# Patient Record
Sex: Male | Born: 1980
Health system: Southern US, Community
[De-identification: ages and names within clinical notes are randomized; demographics above are authoritative.]

## PROBLEM LIST (undated history)

## (undated) DIAGNOSIS — T7840XA Allergy, unspecified, initial encounter: Secondary | ICD-10-CM

## (undated) DIAGNOSIS — R0789 Other chest pain: Secondary | ICD-10-CM

## (undated) DIAGNOSIS — M545 Low back pain, unspecified: Secondary | ICD-10-CM

## (undated) DIAGNOSIS — G4730 Sleep apnea, unspecified: Secondary | ICD-10-CM

## (undated) DIAGNOSIS — E119 Type 2 diabetes mellitus without complications: Secondary | ICD-10-CM

## (undated) DIAGNOSIS — F32A Depression, unspecified: Secondary | ICD-10-CM

## (undated) DIAGNOSIS — K209 Esophagitis, unspecified without bleeding: Secondary | ICD-10-CM

## (undated) DIAGNOSIS — J45909 Unspecified asthma, uncomplicated: Secondary | ICD-10-CM

## (undated) DIAGNOSIS — G43909 Migraine, unspecified, not intractable, without status migrainosus: Secondary | ICD-10-CM

## (undated) DIAGNOSIS — K5790 Diverticulosis of intestine, part unspecified, without perforation or abscess without bleeding: Secondary | ICD-10-CM

## (undated) DIAGNOSIS — E785 Hyperlipidemia, unspecified: Secondary | ICD-10-CM

## (undated) DIAGNOSIS — K219 Gastro-esophageal reflux disease without esophagitis: Secondary | ICD-10-CM

## (undated) DIAGNOSIS — F329 Major depressive disorder, single episode, unspecified: Secondary | ICD-10-CM

## (undated) DIAGNOSIS — K589 Irritable bowel syndrome without diarrhea: Secondary | ICD-10-CM

## (undated) DIAGNOSIS — R1013 Epigastric pain: Secondary | ICD-10-CM

## (undated) DIAGNOSIS — I10 Essential (primary) hypertension: Secondary | ICD-10-CM

## (undated) DIAGNOSIS — E663 Overweight: Secondary | ICD-10-CM

## (undated) DIAGNOSIS — F419 Anxiety disorder, unspecified: Secondary | ICD-10-CM

## (undated) HISTORY — DX: Depression, unspecified: F32.A

## (undated) HISTORY — DX: Hyperlipidemia, unspecified: E78.5

## (undated) HISTORY — DX: Esophagitis, unspecified without bleeding: K20.90

## (undated) HISTORY — DX: Migraine, unspecified, not intractable, without status migrainosus: G43.909

## (undated) HISTORY — DX: Low back pain: M54.5

## (undated) HISTORY — DX: Other chest pain: R07.89

## (undated) HISTORY — DX: Overweight: E66.3

## (undated) HISTORY — DX: Type 2 diabetes mellitus without complications: E11.9

## (undated) HISTORY — DX: Irritable bowel syndrome, unspecified: K58.9

## (undated) HISTORY — DX: Sleep apnea, unspecified: G47.30

## (undated) HISTORY — DX: Allergy, unspecified, initial encounter: T78.40XA

## (undated) HISTORY — DX: Low back pain, unspecified: M54.50

## (undated) HISTORY — PX: TEAR DUCT PROBING: SHX793

## (undated) HISTORY — DX: Anxiety disorder, unspecified: F41.9

## (undated) HISTORY — DX: Epigastric pain: R10.13

## (undated) HISTORY — DX: Diverticulosis of intestine, part unspecified, without perforation or abscess without bleeding: K57.90

## (undated) HISTORY — DX: Unspecified asthma, uncomplicated: J45.909

## (undated) HISTORY — DX: Essential (primary) hypertension: I10

## (undated) HISTORY — PX: DENTAL SURGERY: SHX609

## (undated) HISTORY — DX: Gastro-esophageal reflux disease without esophagitis: K21.9

## (undated) HISTORY — DX: Major depressive disorder, single episode, unspecified: F32.9

---

## 2002-01-29 ENCOUNTER — Emergency Department (HOSPITAL_COMMUNITY): Admission: EM | Admit: 2002-01-29 | Discharge: 2002-01-29 | Payer: Self-pay | Admitting: Emergency Medicine

## 2002-01-29 ENCOUNTER — Encounter: Payer: Self-pay | Admitting: Emergency Medicine

## 2002-04-10 ENCOUNTER — Ambulatory Visit (HOSPITAL_BASED_OUTPATIENT_CLINIC_OR_DEPARTMENT_OTHER): Admission: RE | Admit: 2002-04-10 | Discharge: 2002-04-10 | Payer: Self-pay | Admitting: Neurology

## 2002-08-22 ENCOUNTER — Encounter (INDEPENDENT_AMBULATORY_CARE_PROVIDER_SITE_OTHER): Payer: Self-pay | Admitting: *Deleted

## 2002-08-22 ENCOUNTER — Encounter: Payer: Self-pay | Admitting: Internal Medicine

## 2002-08-22 ENCOUNTER — Encounter: Admission: RE | Admit: 2002-08-22 | Discharge: 2002-08-22 | Payer: Self-pay | Admitting: Internal Medicine

## 2004-07-15 ENCOUNTER — Encounter: Admission: RE | Admit: 2004-07-15 | Discharge: 2004-07-15 | Payer: Self-pay | Admitting: Neurosurgery

## 2004-08-05 ENCOUNTER — Encounter: Admission: RE | Admit: 2004-08-05 | Discharge: 2004-08-05 | Payer: Self-pay | Admitting: Neurosurgery

## 2004-10-24 ENCOUNTER — Encounter: Admission: RE | Admit: 2004-10-24 | Discharge: 2004-10-24 | Payer: Self-pay | Admitting: Neurosurgery

## 2005-01-30 HISTORY — PX: LUMBAR LAMINECTOMY: SHX95

## 2005-05-26 ENCOUNTER — Ambulatory Visit (HOSPITAL_COMMUNITY): Admission: RE | Admit: 2005-05-26 | Discharge: 2005-05-27 | Payer: Self-pay | Admitting: Neurosurgery

## 2005-10-12 ENCOUNTER — Ambulatory Visit: Payer: Self-pay | Admitting: Pulmonary Disease

## 2006-01-17 ENCOUNTER — Ambulatory Visit: Payer: Self-pay | Admitting: Pulmonary Disease

## 2007-05-07 ENCOUNTER — Ambulatory Visit (HOSPITAL_COMMUNITY): Admission: RE | Admit: 2007-05-07 | Discharge: 2007-05-07 | Payer: Self-pay | Admitting: Family Medicine

## 2007-11-18 ENCOUNTER — Telehealth (INDEPENDENT_AMBULATORY_CARE_PROVIDER_SITE_OTHER): Payer: Self-pay | Admitting: *Deleted

## 2007-11-18 DIAGNOSIS — T7840XA Allergy, unspecified, initial encounter: Secondary | ICD-10-CM | POA: Insufficient documentation

## 2007-11-19 ENCOUNTER — Ambulatory Visit: Payer: Self-pay | Admitting: Internal Medicine

## 2007-11-19 DIAGNOSIS — K219 Gastro-esophageal reflux disease without esophagitis: Secondary | ICD-10-CM | POA: Insufficient documentation

## 2007-11-19 LAB — CONVERTED CEMR LAB: Streptococcus, Group A Screen (Direct): NEGATIVE

## 2007-12-20 ENCOUNTER — Ambulatory Visit: Payer: Self-pay | Admitting: Internal Medicine

## 2007-12-23 ENCOUNTER — Telehealth (INDEPENDENT_AMBULATORY_CARE_PROVIDER_SITE_OTHER): Payer: Self-pay | Admitting: *Deleted

## 2007-12-25 LAB — CONVERTED CEMR LAB
ALT: 45 units/L (ref 0–53)
AST: 28 units/L (ref 0–37)
Albumin: 3.9 g/dL (ref 3.5–5.2)
Alkaline Phosphatase: 53 units/L (ref 39–117)
Basophils Absolute: 0 10*3/uL (ref 0.0–0.1)
Basophils Relative: 0.4 % (ref 0.0–3.0)
Calcium: 9.5 mg/dL (ref 8.4–10.5)
Eosinophils Absolute: 0.1 10*3/uL (ref 0.0–0.7)
Eosinophils Relative: 1 % (ref 0.0–5.0)
HCT: 42.9 % (ref 39.0–52.0)
Lymphocytes Relative: 20.9 % (ref 12.0–46.0)
MCV: 89.4 fL (ref 78.0–100.0)
Monocytes Relative: 7.8 % (ref 3.0–12.0)
Neutrophils Relative %: 69.9 % (ref 43.0–77.0)
RDW: 13.8 % (ref 11.5–14.6)
Total Protein: 6.9 g/dL (ref 6.0–8.3)
WBC: 6.4 10*3/uL (ref 4.5–10.5)

## 2008-01-17 ENCOUNTER — Encounter: Payer: Self-pay | Admitting: Adult Health

## 2008-01-17 ENCOUNTER — Ambulatory Visit: Payer: Self-pay | Admitting: Pulmonary Disease

## 2008-01-20 ENCOUNTER — Telehealth (INDEPENDENT_AMBULATORY_CARE_PROVIDER_SITE_OTHER): Payer: Self-pay | Admitting: *Deleted

## 2008-01-21 ENCOUNTER — Encounter: Payer: Self-pay | Admitting: Adult Health

## 2008-02-06 ENCOUNTER — Ambulatory Visit: Payer: Self-pay | Admitting: Pulmonary Disease

## 2008-02-06 DIAGNOSIS — R0789 Other chest pain: Secondary | ICD-10-CM | POA: Insufficient documentation

## 2008-02-06 DIAGNOSIS — E663 Overweight: Secondary | ICD-10-CM | POA: Insufficient documentation

## 2008-02-06 DIAGNOSIS — M545 Low back pain, unspecified: Secondary | ICD-10-CM | POA: Insufficient documentation

## 2008-02-06 DIAGNOSIS — E785 Hyperlipidemia, unspecified: Secondary | ICD-10-CM | POA: Insufficient documentation

## 2008-02-06 DIAGNOSIS — M47816 Spondylosis without myelopathy or radiculopathy, lumbar region: Secondary | ICD-10-CM | POA: Insufficient documentation

## 2008-02-12 ENCOUNTER — Ambulatory Visit: Payer: Self-pay | Admitting: Gastroenterology

## 2008-03-23 ENCOUNTER — Telehealth: Payer: Self-pay | Admitting: Gastroenterology

## 2008-03-24 ENCOUNTER — Encounter: Payer: Self-pay | Admitting: Gastroenterology

## 2008-03-26 ENCOUNTER — Ambulatory Visit: Payer: Self-pay | Admitting: Gastroenterology

## 2008-04-02 ENCOUNTER — Ambulatory Visit: Payer: Self-pay | Admitting: Gastroenterology

## 2008-04-03 ENCOUNTER — Telehealth: Payer: Self-pay | Admitting: Gastroenterology

## 2008-04-03 DIAGNOSIS — R143 Flatulence: Secondary | ICD-10-CM

## 2008-04-03 DIAGNOSIS — R142 Eructation: Secondary | ICD-10-CM

## 2008-04-03 DIAGNOSIS — R141 Gas pain: Secondary | ICD-10-CM | POA: Insufficient documentation

## 2008-04-10 ENCOUNTER — Telehealth: Payer: Self-pay | Admitting: Gastroenterology

## 2008-04-10 ENCOUNTER — Ambulatory Visit (HOSPITAL_COMMUNITY): Admission: RE | Admit: 2008-04-10 | Discharge: 2008-04-10 | Payer: Self-pay | Admitting: Gastroenterology

## 2008-04-14 ENCOUNTER — Telehealth: Payer: Self-pay | Admitting: Gastroenterology

## 2008-04-15 ENCOUNTER — Telehealth: Payer: Self-pay | Admitting: Gastroenterology

## 2008-04-15 ENCOUNTER — Telehealth (INDEPENDENT_AMBULATORY_CARE_PROVIDER_SITE_OTHER): Payer: Self-pay | Admitting: *Deleted

## 2008-04-15 ENCOUNTER — Encounter: Payer: Self-pay | Admitting: Gastroenterology

## 2008-05-25 ENCOUNTER — Ambulatory Visit: Payer: Self-pay | Admitting: Pulmonary Disease

## 2008-05-25 DIAGNOSIS — R5381 Other malaise: Secondary | ICD-10-CM | POA: Insufficient documentation

## 2008-05-25 DIAGNOSIS — R5383 Other fatigue: Secondary | ICD-10-CM

## 2008-05-26 DIAGNOSIS — E291 Testicular hypofunction: Secondary | ICD-10-CM | POA: Insufficient documentation

## 2008-05-27 ENCOUNTER — Encounter (INDEPENDENT_AMBULATORY_CARE_PROVIDER_SITE_OTHER): Payer: Self-pay | Admitting: *Deleted

## 2008-09-08 ENCOUNTER — Telehealth (INDEPENDENT_AMBULATORY_CARE_PROVIDER_SITE_OTHER): Payer: Self-pay | Admitting: *Deleted

## 2008-09-09 ENCOUNTER — Ambulatory Visit: Payer: Self-pay | Admitting: Internal Medicine

## 2008-11-18 ENCOUNTER — Telehealth (INDEPENDENT_AMBULATORY_CARE_PROVIDER_SITE_OTHER): Payer: Self-pay | Admitting: *Deleted

## 2009-02-26 ENCOUNTER — Telehealth: Payer: Self-pay | Admitting: Adult Health

## 2009-03-01 ENCOUNTER — Telehealth: Payer: Self-pay | Admitting: Pulmonary Disease

## 2009-04-05 ENCOUNTER — Telehealth (INDEPENDENT_AMBULATORY_CARE_PROVIDER_SITE_OTHER): Payer: Self-pay | Admitting: *Deleted

## 2010-02-21 ENCOUNTER — Encounter: Payer: Self-pay | Admitting: Family Medicine

## 2010-03-01 NOTE — Progress Notes (Signed)
Summary: appt  Phone Note Call from Patient Call back at [facility] Phone [phone] Call back at [phone] Meriam Sprague)   Caller: Mom Call For: [name] Reason for Call: Acute Illness, Talk to Nurse Summary of Call: dizzy, extreme sob, chest hurts, nose stopped up, watery eyes.  Would like to be seen today if possible.  Initial call taken by: Eugene Gavia,  March 01, 2009 2:25 PM  Follow-up for Phone Call        called to speak with pt----stated we have no openings today and explained that he could see SN on 2-2 but i could get him in to see TP in the am at 11---pt stated that he needed to be seen today and he would just go to the [facility] to be seen. i did rec to pt that if he felt that he needed to be seen now that he should either go to the er or to [facility] [address] CMA  March 01, 2009 2:35 PM

## 2010-03-01 NOTE — Progress Notes (Signed)
Summary: pain  Phone Note Call from Patient   Caller: Patient Call For: nadel Summary of Call: pt having pain in buttock area would like to talk to nurse. Initial call taken by: Rickard Patience,  April 05, 2009 8:44 AM  Follow-up for Phone Call        Pt c/o having soreness in the middle of his buttocks x 3 days. The soreness has turned into a sharp pain. Pt denies any fall or trauma to the area. he aslo states it is not from hemorrhoids. Pt does not radiate to any other area. Please advise. Carron Curie CMA  April 05, 2009 9:40 AM   Additional Follow-up for Phone Call Additional follow up Details #1::        per SN----needs to be seen to see what this is---SN has opening on 3-8 at 12 if pt would like to come in at that time.  thanks [address] CMA  April 05, 2009 3:11 PM   Patient schedule for 04/06/09 @ 12pm and is aware of date and time.Michel Bickers CMA  April 05, 2009 4:05 PM

## 2010-03-01 NOTE — Letter (Signed)
Summary: Out of Work  [employer]  [address]   [place], [place] [address]   Phone: [phone]  Fax: [phone]    January 17, 2008   Employee:  ACESON LABELL    To Whom It May Concern:   For Medical reasons, please excuse the above named employee from work for the following dates:  Start:   Friday January 17, 2008    End:   Saturday Decemeber 19, 2009  Patient may return to work with no restrictions.  If you need additional information, please feel free to contact our office.         Sincerely,        Rubye Oaks, NP

## 2010-03-01 NOTE — Progress Notes (Signed)
Summary: rx  Phone Note Call from Patient Call back at Home Phone [phone] Call back at [phone]   Caller: Mom-Beverly Call For: nadel Reason for Call: Acute Illness, Talk to Nurse Summary of Call: congested, head stopped up, sob.  Would like [place] to call something in. [facility] [address] Initial call taken by: Eugene Gavia,  February 26, 2009 2:35 PM  Follow-up for Phone Call        [email or web address] [address] [facility]  February 26, 2009 2:47 PM   congestion for a while--zpak x 2  week ago given by [facility]--finished the zpak last monday--light green sputum from cough and nasal congestion---no fever---been using the zyrtec but not the mucinex.  please advise---  NKDA [address] [facility]  February 26, 2009 2:53 PM   Additional Follow-up for Phone Call Additional follow up Details #1::        keep on mucinex dm two times a day  saline nasal rinses  for few more days if not improving call back  may need ov.  Additional Follow-up by: Tammy Parrett NP,  February 26, 2009 2:58 PM    Additional Follow-up for Phone Call Additional follow up Details #2::    called and spoke with pt  about recs from TP---pt is aware and will do these over the weekend--will call next week if not better for appt. [address] CMA  February 26, 2009 3:02 PM

## 2010-04-21 ENCOUNTER — Telehealth: Payer: Self-pay | Admitting: Pulmonary Disease

## 2010-04-21 NOTE — Telephone Encounter (Signed)
Duplicate msg.

## 2010-04-21 NOTE — Telephone Encounter (Signed)
Called and spoke with pt and he c/o cough w/ light yellow phlem x 2 weeks, some wheezing, started vomiting today, had a little loss of appetite. Pt is requesting to be seen today. Their are no openings today but per TD we can work pt in tomorrow morning at 10:00. Pt is coming in then to see TP. Pt states if he gets worse today then he will just got to an Urgent care and I advised pt to do that if he feels he can't wait until tomorrow to bee seen. Pt was last seen in 2010.   [place]ver [place], [place]

## 2010-04-22 ENCOUNTER — Ambulatory Visit: Payer: Self-pay | Admitting: Adult Health

## 2010-04-22 ENCOUNTER — Encounter: Payer: Self-pay | Admitting: Pulmonary Disease

## 2010-06-17 NOTE — Op Note (Signed)
NAMELON, KLIPPEL                ACCOUNT NO.:  [id number]   MEDICAL RECORD NO.:  [id number]          PATIENT TYPE:  AMB   LOCATION:  SDS                          FACILITY:  [facility]   PHYSICIAN:  Coletta Memos, M.D.     DATE OF BIRTH:  Sep 03, 1980   DATE OF PROCEDURE:  05/26/2005  DATE OF DISCHARGE:                                 OPERATIVE REPORT   PREOPERATIVE DIAGNOSIS:  Degenerative disk disease L4-L5, L5-S1, displaced  disk L4-L5, L5-S1, left L5 and left S1 radiculopathies.   POSTOPERATIVE DIAGNOSES:  Degenerative disk disease L4-L5, L5-S1, displaced  disk L4-L5, L5-S1, left L5 and left S1 radiculopathies.   PROCEDURE:  1.  Left L4-L5 hemilaminectomy diskectomy with microdissection.  2.  Left L5-S1 hemilaminectomy and diskectomy with microdissection.   COMPLICATIONS:  None.   SURGEON:  Coletta Memos, M.D.   ASSISTANT:  Hilda Lias, M.D.   ANESTHESIA:  General endotracheal.   INDICATIONS:  Elder Davidian is a 30 year old young man who has severe pain in  his back and left lower extremity.  He has two severely degenerative  degenerated disks at L4-L5 and L5-S1 and two rather large disk herniations  at L4-L5 and L5-S1.  He also is stenotic secondary to essentially what are  congenitally short pedicles.  I recommended and he agreed to undergo  operative decompression after a long try of conservative therapy that left  him in too much pain and he decided he just wanted to go forward.   OPERATIVE NOTE:  Mr. Alvidrez was brought to the operating room, intubated, and  placed under general anesthetic without difficulty.  He was rolled prone  onto a Wilson frame and all pressure points were properly padded.  His back  was prepped and he was draped in a sterile fashion.  I infiltrated 20 mL  0.5% lidocaine, 1:200,000 epinephrine into the lumbar region.  With the use  of a preoperative localizing film, I opened the skin with a #10 blade and I  took this down to the thoracolumbar fascia.   I then exposed the lamina of  L4, L5 and S1.  I took an interoperative x-ray and it was at the L5-S1  interlaminar space.  I then proceeded with a hemilaminectomy of L5 using a  high-speed drill.  I removed the ligamentum flavum and exposed the thecal  sac.  It was quite tight and the space was very narrow.  I then readjusted  my retractor and performed a hemilaminectomy of L4 and L5 using the high-  speed drill.  I again removed the ligamentum flavum to expose the thecal  sac.  This level too was just as tight as the L5-S1 level.  I brought the  microscope into the operative field and proceeded with a diskectomy using  micro instruments at L4-L5.  While thecal sac was easily discernible, it was  somewhat difficult to define the plane between it and the disk only because  the disk was so degenerated and so bulbous that I thought it was initially  was nerve.  However, it was not and a large amount of  disk material extruded  under some pressure after piercing the annulus with a Cytogeneticist.  The disk material was quite soft and very degenerated and came out initially  in large pieces then in piecemeal fashion.  Minimal scraping was done inside  the disk space and no scraping of the endplates.  The L5 root appeared to be  thoroughly decompressed but, again, his pedicles were quite short.  I  irrigated that area of the dissection.  I then went down to L5-S1.  Again,  with microdissection, I was able to define a plane between the thecal sac  and what was, at this point, an even larger disk herniation.  I opened the  disk space with a #15 blade and a great deal of disk material extruded under  some pressure.  Then, with the use of pituitary rongeurs, we removed disk  material until it was felt to be no more loose pieces of disk within the  disk space.  The nerve root was much better with regards to space.  I then  irrigated the wound.  I then closed the wound in a layered fashion using   Vicryl sutures to reapproximate the thoracolumbar fascia, subcutaneous  tissues, and subcuticular edges.  Dermabond was used for a sterile dressing.  He tolerated procedure well.           ______________________________  Coletta Memos, M.D.     KC/MEDQ  D:  05/26/2005  T:  05/26/2005  Job:  [id number]

## 2010-07-05 ENCOUNTER — Ambulatory Visit (INDEPENDENT_AMBULATORY_CARE_PROVIDER_SITE_OTHER): Payer: Managed Care, Other (non HMO) | Admitting: Adult Health

## 2010-07-05 ENCOUNTER — Encounter: Payer: Self-pay | Admitting: *Deleted

## 2010-07-05 ENCOUNTER — Other Ambulatory Visit (INDEPENDENT_AMBULATORY_CARE_PROVIDER_SITE_OTHER): Payer: Managed Care, Other (non HMO)

## 2010-07-05 ENCOUNTER — Encounter: Payer: Self-pay | Admitting: Adult Health

## 2010-07-05 VITALS — BP 124/84 | HR 96 | Temp 98.0°F | Ht 71.0 in | Wt 231.6 lb

## 2010-07-05 DIAGNOSIS — R000 Tachycardia, unspecified: Secondary | ICD-10-CM

## 2010-07-05 DIAGNOSIS — F419 Anxiety disorder, unspecified: Secondary | ICD-10-CM

## 2010-07-05 DIAGNOSIS — F411 Generalized anxiety disorder: Secondary | ICD-10-CM

## 2010-07-05 LAB — CBC WITH DIFFERENTIAL/PLATELET
Basophils Relative: 0.3 % (ref 0.0–3.0)
Eosinophils Absolute: 0 10*3/uL (ref 0.0–0.7)
Eosinophils Relative: 0.5 % (ref 0.0–5.0)
HCT: 44.1 % (ref 39.0–52.0)
Hemoglobin: 15.3 g/dL (ref 13.0–17.0)
Lymphocytes Relative: 23.3 % (ref 12.0–46.0)
Lymphs Abs: 1.7 10*3/uL (ref 0.7–4.0)
MCHC: 34.7 g/dL (ref 30.0–36.0)
Monocytes Absolute: 0.6 10*3/uL (ref 0.1–1.0)
Monocytes Relative: 7.7 % (ref 3.0–12.0)
Neutro Abs: 5.1 10*3/uL (ref 1.4–7.7)

## 2010-07-05 LAB — BASIC METABOLIC PANEL
CO2: 30 mEq/L (ref 19–32)
Chloride: 107 mEq/L (ref 96–112)
Creatinine, Ser: 1 mg/dL (ref 0.4–1.5)
GFR: 93.36 mL/min (ref >60.00)
Glucose, Bld: 90 mg/dL (ref 70–99)
Potassium: 4.2 mEq/L (ref 3.5–5.1)
Sodium: 142 mEq/L (ref 135–145)

## 2010-07-05 LAB — HEPATIC FUNCTION PANEL
AST: 25 U/L (ref 0–37)
Bilirubin, Direct: 0.1 mg/dL (ref 0.0–0.3)
Total Bilirubin: 0.2 mg/dL — ABNORMAL LOW (ref 0.3–1.2)

## 2010-07-05 MED ORDER — ALPRAZOLAM 0.25 MG PO TABS: 0.2500 mg | ORAL_TABLET | Freq: Two times a day (BID) | ORAL | Status: DC | PRN

## 2010-07-05 NOTE — Progress Notes (Signed)
Subjective:    Patient ID: Anthony Russell, male    DOB: [birth year], 30 y.o.   MRN: [id number]  HPI 30 yo WM with known hx of   07/05/10 Acute OV  Pt presents for an acute office visit. Complains of rapid HR this morning with increased SOB and tightness in chest.  pt reports HR has "slowed" but still having some tightness in chest.  reports under extra stress lately  at work. Happens when he is upset and anxious at work. His work at [employer] if very stressful. He feels he is constantly being monitored and watched. They make the employees report their time and location on a constant basis. This causing him to constantly worry.   No radiating pain , exertional chest pain, syncope, visual/speech changes, dyspnea, n/v.   Has restarted smoking. Drinks 4-5 sodas daily.   Sleeps 5-6 hrs night.  Does not use decongestants. Denies drug use. Rare etoh.  Drinks energy drinks 1-2 weekly.   PMH :  CHEST PAIN, ATYPICAL (ICD-786.59) - recent neg eval w/ normal CXR/ EKG...  HYPERLIPIDEMIA (ICD-272.4) - on diet alone...  ~ FLP 9/07 showed TChol 154, TG 183, HDL 23, LDL 95... rec- diet + exercise, may need Fibrate.  OVERWEIGHT (ICD-278.02) - weight Jan10= 246#... he was 225# in 9/07... we discussed diet & exercise program...  GERD (ICD-530.81) & DYSPEPSIA (ICD-536.8) - on PROTONIX 40mg /d...  ~ UGI series 7/04 showed GE reflux w/o HH seen...  ~ AbdSonar 4/09 was normal...  ~ Jan10: Protonix incr to Bid and referred to GI- DrKaplan...  Hx of BACK PAIN, LUMBAR (ICD-724.2) - s/p lumbar laminectomy 2007 by DrCabbell.    Review of Systems Constitutional:   No  weight loss, night sweats,  Fevers, chills, fatigue, or  lassitude.  HEENT:   No headaches,  Difficulty swallowing,  Tooth/dental problems, or  Sore throat,                No sneezing, itching, ear ache, nasal congestion, post nasal drip,   CV:  Orthopnea, PND, swelling in lower extremities, anasarca, dizziness, palpitations, syncope.   GI  No  heartburn, indigestion, abdominal pain, nausea, vomiting, diarrhea, change in bowel habits, loss of appetite, bloody stools.   Resp: No shortness of breath with exertion or at rest.  No excess mucus, no productive cough,  No non-productive cough,  No coughing up of blood.  No change in color of mucus.  No wheezing.  No chest wall deformity  Skin: no rash or lesions.  GU: no dysuria, change in color of urine, no urgency or frequency.  No flank pain, no hematuria   MS:  No joint pain or swelling.  No decreased range of motion.  No back pain.  Psych:  No change in mood or affect. No depression or anxiety.  No memory loss.         Objective:   Physical Exam GEN: A/Ox3; pleasant , NAD, obese   HEENT:  [place]/AT,  EACs-clear, TMs-wnl, NOSE-clear, THROAT-clear, no lesions, no postnasal drip or exudate noted.   NECK:  Supple w/ fair ROM; no JVD; normal carotid impulses w/o bruits; no thyromegaly or nodules palpated; no lymphadenopathy.  RESP  Clear  P & A; w/o, wheezes/ rales/ or rhonchi.no accessory muscle use, no dullness to percussion  CARD:  RRR, no m/r/g  , no peripheral edema, pulses intact, no cyanosis or clubbing.  GI:   Soft & nt; nml bowel sounds; no organomegaly or masses detected.  Musco:  Warm bil, no deformities or joint swelling noted.   Neuro: alert, no focal deficits noted.    Skin: Warm, no lesions or rashes     EKG : NSR w/ HR ~80, No acute finding noted.     Assessment & Plan:

## 2010-07-05 NOTE — Assessment & Plan Note (Addendum)
EKG with no acute changes and no sign of tachycardia.  Advised on decreasing caffeine.  Increasing sleep.  Exercise.

## 2010-07-05 NOTE — Patient Instructions (Signed)
I will call with labs results.  Really work on stress reducers. - walking, exercise Cut down on caffeine. - limit to one soda daily.  Need >6 hrs sleep daily.  May use xanax 0.25mg  1 Twice daily  As needed  Anxiety. Please contact office for sooner follow up if symptoms do not improve or worsen or seek emergency care  follow up Dr. Kriste Basque  In 2 months for physical.

## 2010-07-05 NOTE — Assessment & Plan Note (Addendum)
Suspect stress related. Advised on stress reducer.  Will use xanax very briefly.  Labs pending.   Plan :  Really work on stress reducers. - walking, exercise Cut down on caffeine. - limit to one soda daily.  Need >6 hrs sleep daily.  May use xanax 0.25mg  1 Twice daily  As needed  Anxiety. Please contact office for sooner follow up if symptoms do not improve or worsen or seek emergency care  follow up Dr. Kriste Basque  In 2 months for physical.

## 2010-07-06 ENCOUNTER — Encounter: Payer: Self-pay | Admitting: *Deleted

## 2010-07-07 ENCOUNTER — Telehealth: Payer: Self-pay | Admitting: Adult Health

## 2010-07-07 NOTE — Telephone Encounter (Signed)
Pt was given xanax 0.25mg  1 tab by mouth twice daily by TP on 6.5.12.  Called spoke with patient who states that he has been taking the xanax bid in the morning and at bedtime but it's "knocking him out" rather than helping with the anxiety.  i asked patient if he is anxious when he takes the med or if he's just been taking it twice daily > pt states he has been anxious at those times.  Pt reports that he is still anxious during the rest of the day.  Pt is aware clinic is closed for the day and is okay with a call back tomorrow.  Will forward to SN for recs as TP is not in the office this week.

## 2010-07-08 MED ORDER — CLONAZEPAM 0.5 MG PO TABS: ORAL_TABLET | ORAL | Status: AC

## 2010-07-08 NOTE — Telephone Encounter (Signed)
Per SN-change to Klonipin 0.5 mg #60 1/2 to 1 by mouth bid   Pt aware and Rx called to pharmacy on file.

## 2010-07-15 ENCOUNTER — Telehealth: Payer: Self-pay | Admitting: Pulmonary Disease

## 2010-07-15 MED ORDER — SERTRALINE HCL 50 MG PO TABS: 50.0000 mg | ORAL_TABLET | Freq: Every day | ORAL | Status: DC

## 2010-07-15 NOTE — Telephone Encounter (Signed)
Called and spoke with pt. Pt was seen by TP on 6/5 for anxiety.  Started on Xanax 0.25 bid prn.  Pt called on 6/7 stating the xanax felt like it was "knocking him out."  Therefore SN changed him to Klonopin 0.5. Pt is taking 1 tab bid.  Pt states the Klonopin helps "sometimes."  Pt states his job is still very stressful.  States he has been trying to find different jobs but is not having any success.  Pt states he could quit his job but would rthen not get unemployment. Pt states he still is just having a a lot of anxiety and is requesting SN/TP's recs.  Please advise.  Thanks.

## 2010-07-15 NOTE — Telephone Encounter (Signed)
We discussed adding Zoloft 50mg  daily at office but he declined a maintenance med since this was all related to his job He is welcome to start this for anxiety. #30 with 1 refill with ov at 6 weeks for follow up  He will take a while for this to kick in, can use klonopin for As needed  Use as well Please contact office for sooner follow up if symptoms do not improve or worsen or seek emergency care  Stress reducers, exercise. Etc.  Please contact office for sooner follow up if symptoms do not improve or worsen or seek emergency care

## 2010-07-15 NOTE — Telephone Encounter (Signed)
Called and spoke with pt.  Informed him of TP's recs. Pt agreed to start Zoloft 50mg . Re-informed pt of the stress reducers that TP had originally talked to him about on 6/5.  Pt states he is exercising and has reduced his caffeine intake.  Rx sent to pharmacy.  Pt aware.

## 2010-09-08 ENCOUNTER — Ambulatory Visit: Payer: Managed Care, Other (non HMO) | Admitting: Pulmonary Disease

## 2010-11-24 ENCOUNTER — Telehealth: Payer: Self-pay | Admitting: Pulmonary Disease

## 2010-11-24 MED ORDER — AMOXICILLIN-POT CLAVULANATE 875-125 MG PO TABS: 1.0000 | ORAL_TABLET | Freq: Two times a day (BID) | ORAL | Status: AC

## 2010-11-24 MED ORDER — FIRST-DUKES MOUTHWASH MT SUSP: OROMUCOSAL | Status: DC

## 2010-11-24 NOTE — Telephone Encounter (Signed)
Called and spoke with pt.  Pt states symptoms started approx 6 days ago.  C/o productive cough with light green sputum,  Sore throat, difficulty swallowing and talking d/t sore throat and mild wheezing, tightness in chest and increased sob. Denies f/c/s.   Pt was last seen by TP 07/2010 but hasn't seen SN since Jan 2010!!!!  Pt willing to come in today to be seen but no appts avail with either TP or SN.  Please advise. No Known Allergies

## 2010-11-24 NOTE — Telephone Encounter (Signed)
Per SN---ok for augmentin 875mg   #14  1 po bid , and mmw  #4oz1 tsp gargle and swallow four times daily prn.  Called and spoke with pt and he is aware of meds sent to the pharmacy.

## 2011-03-23 ENCOUNTER — Telehealth: Payer: Self-pay

## 2011-05-10 ENCOUNTER — Telehealth: Payer: Self-pay | Admitting: Pulmonary Disease

## 2011-05-10 MED ORDER — AMOXICILLIN-POT CLAVULANATE 875-125 MG PO TABS: 1.0000 | ORAL_TABLET | Freq: Two times a day (BID) | ORAL | Status: AC

## 2011-05-10 NOTE — Telephone Encounter (Signed)
Per SN--ok for augmentin 875mg   #14  1 po bid , mucinex 2 po bid with plenty of fluids, tylenol prn, align once daily.  Will need to schedule CPX with Dr. Angelina Sheriff seen SN in 01/2008 and he will fall out of the practice if he does not schedule an appt.  thanks

## 2011-05-10 NOTE — Telephone Encounter (Signed)
Pt called to check on status of this call.  Pt is concerned b/c he hasn't heard anything back yet from anyone.  Antionette Fairy

## 2011-05-10 NOTE — Telephone Encounter (Signed)
I spoke with pt and he c/o sore throat, PND, cough w/ light green phlem, loss of appetite, nasal congestion, chest congestion, increase SOB, some nausea, sweats and felt hot last night but did not take temp. He states this has been going on x Sunday afternoon. He has been taking clairitin, mucinex, zicam nasal spray, and benadryl. Pt is requesting to have something called in for him. Please advise SN, thanks  No Known Allergies   [facility] [address]

## 2011-05-10 NOTE — Telephone Encounter (Signed)
i spoke with pt and is aware of SN recs. He voiced his understanding and rx has been called into pharmacy. Pt was scheduled for cpx 07/05/11 at 12:00

## 2011-05-11 ENCOUNTER — Other Ambulatory Visit: Payer: Self-pay | Admitting: Family Medicine

## 2011-05-24 ENCOUNTER — Other Ambulatory Visit: Payer: Self-pay | Admitting: Family Medicine

## 2011-07-05 ENCOUNTER — Encounter: Payer: Self-pay | Admitting: Pulmonary Disease

## 2011-07-05 ENCOUNTER — Ambulatory Visit (INDEPENDENT_AMBULATORY_CARE_PROVIDER_SITE_OTHER): Payer: Self-pay | Admitting: Pulmonary Disease

## 2011-07-05 VITALS — BP 138/78 | HR 118 | Temp 97.7°F | Ht 71.0 in | Wt 258.0 lb

## 2011-07-05 DIAGNOSIS — E291 Testicular hypofunction: Secondary | ICD-10-CM

## 2011-07-05 DIAGNOSIS — E663 Overweight: Secondary | ICD-10-CM

## 2011-07-05 DIAGNOSIS — F411 Generalized anxiety disorder: Secondary | ICD-10-CM

## 2011-07-05 DIAGNOSIS — J683 Other acute and subacute respiratory conditions due to chemicals, gases, fumes and vapors: Secondary | ICD-10-CM | POA: Insufficient documentation

## 2011-07-05 DIAGNOSIS — F419 Anxiety disorder, unspecified: Secondary | ICD-10-CM

## 2011-07-05 DIAGNOSIS — J45909 Unspecified asthma, uncomplicated: Secondary | ICD-10-CM

## 2011-07-05 DIAGNOSIS — K3184 Gastroparesis: Secondary | ICD-10-CM

## 2011-07-05 DIAGNOSIS — K219 Gastro-esophageal reflux disease without esophagitis: Secondary | ICD-10-CM

## 2011-07-05 DIAGNOSIS — M545 Low back pain, unspecified: Secondary | ICD-10-CM

## 2011-07-05 MED ORDER — FLUTICASONE PROPIONATE HFA 110 MCG/ACT IN AERO: 2.0000 | INHALATION_SPRAY | Freq: Two times a day (BID) | RESPIRATORY_TRACT | Status: DC

## 2011-07-05 MED ORDER — SERTRALINE HCL 50 MG PO TABS: 50.0000 mg | ORAL_TABLET | Freq: Every day | ORAL | Status: DC

## 2011-07-05 MED ORDER — PANTOPRAZOLE SODIUM 40 MG PO TBEC: 40.0000 mg | DELAYED_RELEASE_TABLET | Freq: Every day | ORAL | Status: DC

## 2011-07-05 MED ORDER — ALBUTEROL SULFATE HFA 108 (90 BASE) MCG/ACT IN AERS: 2.0000 | INHALATION_SPRAY | Freq: Four times a day (QID) | RESPIRATORY_TRACT | Status: DC | PRN

## 2011-07-05 NOTE — Patient Instructions (Signed)
Today we updated your med list in our EPIC system...    Continue your current medications the same...    We refilled your meds per request...  Let's get on track w/ our diet & exercise program...    The goal is to lose that first 15-20 lbs, the rest is "easy"...  Call for any problems.Marland KitchenMarland Kitchen

## 2011-07-09 ENCOUNTER — Encounter: Payer: Self-pay | Admitting: Pulmonary Disease

## 2011-07-09 NOTE — Progress Notes (Signed)
Subjective:     Patient ID: Anthony Russell, male   DOB: 11-16-1980, 31 y.o.   MRN: [id number]  HPI 31 y/o WM here for a follow up visit...   ~  Jan10:  I last saw him 12/07 for an add-on visit due to sore throat... he has hx of Overweight and Hyperlipidemia- on diet Rx alone... he saw TParrett,NP recently w/ atypical CP, reflux symptoms, abd gas complaints- he has CXR= clear, WNL;  EKG= NSR, WNL;  Labs= normal;  and was seen in [facility] w/ AbdSonar said to be normal as well... he was treated w/ Protonix, Pepcid, anti-gas meds w/ some improvement... appt sched w/ GI- DrKaplan next week.  ~  July 05, 2011:  3 year ROV & [name] is here for check up & refill of meds;  He tells me he hurt his back at work [employer]) & was fired, he sued the [employer] for [employer] won that & a lump sum; he has prev hx LumbarLam 2007 by [employer] & needs f/u eval (he will set this up on his own)... He does not have insurance at present & requests NO CXR, EKG, Lab work...    His breathing has been good on Flovent Bid & Proair as needed, he denies URI or exac;  He takes Protonix for reflux & symptoms are controlled;  Also on Zoloft50 but just uses it as needed for depression...  We reviewed prob list, meds, xrays and labs> see below>>   Problems List:    ALLERGY (ICD-995.3) - Rx w/ OTC antihistamines Prn.  Hx of UPPER RESPIRATORY INFECTION - he is an active smoker... Prev URIs treated w/ ZPak in past. ASTHMATIC BRONCHITIS >> on FLOVENT 110- 2 puffs Bid, PROAIR HFA 1-2 sp as needed... ~  CXR 12/09 showed normal heart size, clear lungs, NAD...  CHEST PAIN, ATYPICAL (ICD-786.59) - prev neg eval w/ normal CXR/ EKG...  HYPERLIPIDEMIA (ICD-272.4) - on diet alone... ~  FLP 9/07 showed TChol 154, TG 183, HDL 23, LDL 95... rec- diet + exercise, may need Fibrate.  OVERWEIGHT (ICD-278.02) - we discussed diet & exercise program... ~  9/07:  Weight = 225# ~  1/10:  Weight = 246# ~  6/13:  Weight = 258#  GERD (ICD-530.81) &  DYSPEPSIA (ICD-536.8) - on PROTONIX 40mg /d... ~  UGI series 7/04 showed GE reflux w/o HH seen... ~  AbdSonar 4/09 was normal... ~  Jan10:  Protonix incr to Bid and referred to GI- DrKaplan... ~  EGD 3/10 by DrKaplan was WNL.[name]. ~  Gastric Emptying Scan 3/10 showed 73% retention at 1H & 63% at 2H (norm <30% at [facility]).  HYPOGONADISM >>  ~  Labs 4/10 showed Testos level = 233  Hx of BACK PAIN, LUMBAR (ICD-724.2) - s/p lumbar laminectomy 2007 by DrCabbell.  ANXIETY & DEPRESSION >> on ZOLOFT 50mg  but he prefers to take it just as needed when depressed.   Past Surgical History  Procedure Date  . Lumbar laminectomy 2007    DrCabbell    Outpatient Encounter Prescriptions as of 07/05/2011  Medication Sig Dispense Refill  . Calcium-Magnesium-Zinc 500-250-12.5 MG TABS Take by mouth 3 (three) times daily.        . cetirizine (ZYRTEC ALLERGY) 10 MG tablet Take 10 mg by mouth daily as needed.       . fluticasone (FLOVENT HFA) 110 MCG/ACT inhaler Inhale 2 puffs into the lungs 2 (two) times daily.  1 Inhaler  11  . guaiFENesin (MUCINEX) 600 MG  12 hr tablet Take 600 mg by mouth daily.        . pantoprazole (PROTONIX) 40 MG tablet Take 1 tablet (40 mg total) by mouth daily.  30 tablet  11  . sertraline (ZOLOFT) 50 MG tablet Take 1 tablet (50 mg total) by mouth daily.  30 tablet  11  . albuterol (PROVENTIL HFA;VENTOLIN HFA) 108 (90 BASE) MCG/ACT inhaler Inhale 2 puffs into the lungs every 6 (six) hours as needed for wheezing.  1 Inhaler  11    No Known Allergies   Current Medications, Allergies, Past Medical History, Past Surgical History, Family History, and Social History were reviewed in [employer] record.   Review of Systems        The patient complains of severe indigestion/heartburn.  The patient denies anorexia, fever, weight loss, weight gain, vision loss, decreased hearing, hoarseness, chest pain, syncope, dyspnea on exertion, peripheral edema, prolonged cough,  headaches, hemoptysis, abdominal pain, melena, hematochezia, hematuria, incontinence, muscle weakness, suspicious skin lesions, transient blindness, difficulty walking, depression, unusual weight change, abnormal bleeding, enlarged lymph nodes, and angioedema >> all other systems neg except as noted...    Objective:   Physical Exam    WD, Overweight, 30 y/o WM in NAD... flat affect... GENERAL:  Alert & oriented; pleasant & cooperative... HEENT:  [place]/AT, EOM-wnl, PERRLA, EACs-clear, TMs-wnl, NOSE-clear, THROAT-clear & wnl. NECK:  Supple w/ fairROM; no JVD; normal carotid impulses w/o bruits; no thyromegaly or nodules palpated; no lymphadenopathy. CHEST:  Clear to P & A; without wheezes/ rales/ or rhonchi. HEART:  Regular Rhythm; without murmurs/ rubs/ or gallops. ABDOMEN:  Soft w/ min epig tender on palp; normal bowel sounds; no organomegaly or masses detected. EXT: without deformities or arthritic changes; no varicose veins/ venous insuffic/ or edema. NEURO:  CN's intact; motor testing normal; sensory testing normal; gait normal & balance OK. DERM:  No lesions noted; no rash etc...  RADIOLOGY DATA:  Reviewed in the EPIC EMR & discussed w/ the patient...  LABORATORY DATA:  Reviewed in the EPIC EMR & discussed w/ the patient... LABS 6/12:  Chems- wnl;  CBC- wnl;  TSH=1.44   Assessment:     Hx AR/ AB>  Stable on Flovent 100 and Proair HFA as needed...  Hx Atyp CP>  Neg eval 6/12 w/ normal CXR, EKG...  Hyperlipid>  Borderline numbers in the past, on diet alone...  Overweight>  We reviewed diet & exercise needed...  GERD>  He had gastroparesis on emptying scan & remains on Protonix & antireflux regimen...  Hx LBP>  He is s/p LLam 2007 by DrCabbell...  Anxiety/ Depression>  On Zoloft as needed...     Plan:     Patient's Medications  New Prescriptions   ALBUTEROL (PROVENTIL HFA;VENTOLIN HFA) 108 (90 BASE) MCG/ACT INHALER    Inhale 2 puffs into the lungs every 6 (six) hours as  needed for wheezing.  Previous Medications   CALCIUM-MAGNESIUM-ZINC 500-250-12.5 MG TABS    Take by mouth 3 (three) times daily.     CETIRIZINE (ZYRTEC ALLERGY) 10 MG TABLET    Take 10 mg by mouth daily as needed.    GUAIFENESIN (MUCINEX) 600 MG 12 HR TABLET    Take 600 mg by mouth daily.    Modified Medications   Modified Medication Previous Medication   FLUTICASONE (FLOVENT HFA) 110 MCG/ACT INHALER fluticasone (FLOVENT HFA) 110 MCG/ACT inhaler      Inhale 2 puffs into the lungs 2 (two) times daily.    Inhale  2 puffs into the lungs 2 (two) times daily.     PANTOPRAZOLE (PROTONIX) 40 MG TABLET pantoprazole (PROTONIX) 40 MG tablet      Take 1 tablet (40 mg total) by mouth daily.    Take 40 mg by mouth daily.     SERTRALINE (ZOLOFT) 50 MG TABLET sertraline (ZOLOFT) 50 MG tablet      Take 1 tablet (50 mg total) by mouth daily.    Take 1 tablet (50 mg total) by mouth daily.  Discontinued Medications   DEXTROMETHORPHAN-GUAIFENESIN (TUSSIN DM) 10-100 MG/5ML LIQUID    As directed as needed    DIPHENHYD-HYDROCORT-NYSTATIN (FIRST-DUKES MOUTHWASH) SUSP    1 tsp gargle and swallow four times daily as needed

## 2012-02-09 ENCOUNTER — Encounter: Payer: Self-pay | Admitting: Adult Health

## 2012-02-09 ENCOUNTER — Ambulatory Visit (INDEPENDENT_AMBULATORY_CARE_PROVIDER_SITE_OTHER): Payer: BC Managed Care – PPO | Admitting: Adult Health

## 2012-02-09 VITALS — BP 122/86 | HR 95 | Temp 98.7°F | Wt 267.0 lb

## 2012-02-09 DIAGNOSIS — L989 Disorder of the skin and subcutaneous tissue, unspecified: Secondary | ICD-10-CM

## 2012-02-09 NOTE — Assessment & Plan Note (Signed)
Enlarging lesion/?mole of the right eyebrow   Plan  Refer to Derm ASAP .

## 2012-02-09 NOTE — Progress Notes (Signed)
Subjective:     Patient ID: Anthony Russell, male   DOB: [birth year], 32 y.o.   MRN: [id number]  HPI  32 y/o WM here    ~  Jan10:  I last saw him 12/07 for an add-on visit due to sore throat... he has hx of Overweight and Hyperlipidemia- on diet Rx alone... he saw TParrett,NP recently w/ atypical CP, reflux symptoms, abd gas complaints- he has CXR= clear, WNL;  EKG= NSR, WNL;  Labs= normal;  and was seen in [facility] w/ AbdSonar said to be normal as well... he was treated w/ Protonix, Pepcid, anti-gas meds w/ some improvement... appt sched w/ GI- DrKaplan next week.  ~  July 05, 2011:  3 year ROV & Anthony Russell is here for check up & refill of meds;  He tells me he hurt his back at work [employer]) & was fired, he sued the [employer] for [employer] won that & a lump sum; he has prev hx LumbarLam 2007 by [employer] & needs f/u eval (he will set this up on his own)... He does not have insurance at present & requests NO CXR, EKG, Lab work...    His breathing has been good on Flovent Bid & Proair as needed, he denies URI or exac;  He takes Protonix for reflux & symptoms are controlled;  Also on Zoloft50 but just uses it as needed for depression...  We reviewed prob list, meds, xrays and labs> see below>>  02/09/2012 Acute OV  Complains of "Spot" over right eye. Has been present since 08/2011. Denies any pain upon touch.   Problems List:    ALLERGY (ICD-995.3) - Rx w/ OTC antihistamines Prn.  Hx of UPPER RESPIRATORY INFECTION - he is an active smoker... Prev URIs treated w/ ZPak in past. ASTHMATIC BRONCHITIS >> on FLOVENT 110- 2 puffs Bid, PROAIR HFA 1-2 sp as needed... ~  CXR 12/09 showed normal heart size, clear lungs, NAD...  CHEST PAIN, ATYPICAL (ICD-786.59) - prev neg eval w/ normal CXR/ EKG...  HYPERLIPIDEMIA (ICD-272.4) - on diet alone... ~  FLP 9/07 showed TChol 154, TG 183, HDL 23, LDL 95... rec- diet + exercise, may need Fibrate.  OVERWEIGHT (ICD-278.02) - we discussed diet & exercise program... ~   9/07:  Weight = 225# ~  1/10:  Weight = 246# ~  6/13:  Weight = 258#  GERD (ICD-530.81) & DYSPEPSIA (ICD-536.8) - on PROTONIX 40mg /d... ~  UGI series 7/04 showed GE reflux w/o HH seen... ~  AbdSonar 4/09 was normal... ~  Jan10:  Protonix incr to Bid and referred to GI- DrKaplan... ~  EGD 3/10 by DrKaplan was WNL.[name]. ~  Gastric Emptying Scan 3/10 showed 73% retention at 1H & 63% at 2H (norm <30% at [facility]).  HYPOGONADISM >>  ~  Labs 4/10 showed Testos level = 233  Hx of BACK PAIN, LUMBAR (ICD-724.2) - s/p lumbar laminectomy 2007 by DrCabbell.  ANXIETY & DEPRESSION >> on ZOLOFT 50mg  but he prefers to take it just as needed when depressed.   Past Surgical History  Procedure Date  . Lumbar laminectomy 2007    DrCabbell    Outpatient Encounter Prescriptions as of 32/05/2011  Medication Sig Dispense Refill  . Calcium-Magnesium-Zinc 500-250-12.5 MG TABS Take by mouth 3 (three) times daily.        . cetirizine (ZYRTEC ALLERGY) 10 MG tablet Take 10 mg by mouth daily as needed.       . fluticasone (FLOVENT HFA) 110 MCG/ACT inhaler Inhale 2 puffs  into the lungs 2 (two) times daily.  1 Inhaler  11  . guaiFENesin (MUCINEX) 600 MG 12 hr tablet Take 600 mg by mouth daily.        . pantoprazole (PROTONIX) 40 MG tablet Take 1 tablet (40 mg total) by mouth daily.  30 tablet  11  . sertraline (ZOLOFT) 50 MG tablet Take 1 tablet (50 mg total) by mouth daily.  30 tablet  11  . albuterol (PROVENTIL HFA;VENTOLIN HFA) 108 (90 BASE) MCG/ACT inhaler Inhale 2 puffs into the lungs every 6 (six) hours as needed for wheezing.  1 Inhaler  11    No Known Allergies   Current Medications, Allergies, Past Medical History, Past Surgical History, Family History, and Social History were reviewed in [employer] record.   Review of Systems Constitutional:   No  weight loss, night sweats,  Fevers, chills, fatigue, or  lassitude.  HEENT:   No headaches,  Difficulty swallowing,  Tooth/dental  problems, or  Sore throat,                No sneezing, itching, ear ache, nasal congestion, post nasal drip,   CV:  No chest pain,  Orthopnea, PND, swelling in lower extremities, anasarca, dizziness, palpitations, syncope.   GI  No heartburn, indigestion, abdominal pain, nausea, vomiting, diarrhea, change in bowel habits, loss of appetite, bloody stools.   Resp: No shortness of breath with exertion or at rest.  No excess mucus, no productive cough,  No non-productive cough,  No coughing up of blood.  No change in color of mucus.  No wheezing.  No chest wall deformity  Skin: no rash or lesions.  GU: no dysuria, change in color of urine, no urgency or frequency.  No flank pain, no hematuria   MS:  No joint pain or swelling.  No decreased range of motion.  No back pain.  Psych:  No change in mood or affect. No depression or anxiety.  No memory loss.               Objective:   Physical Exam     WD, Overweight, 32 y/o WM in NAD... flat affect... GENERAL:  Alert & oriented; pleasant & cooperative... HEENT:  [place]/AT,  EACs-clear, TMs-wnl, NOSE-clear, THROAT-clear & wnl. NECK:  Supple w/ fairROM; no JVD; normal carotid impulses w/o bruits; no thyromegaly or nodules palpated; no lymphadenopathy. CHEST:  Clear to P & A; without wheezes/ rales/ or rhonchi. HEART:  Regular Rhythm; without murmurs/ rubs/ or gallops. ABDOMEN:  Soft w/   normal bowel sounds; no organomegaly or masses detected. EXT: without deformities or arthritic changes; no varicose veins/ venous insuffic/ or edema. NEURO:   gait normal & balance OK. DERM:  Along right mid eyebrow , punctate lesion ~0.25cm     Assessment:

## 2012-02-09 NOTE — Patient Instructions (Addendum)
We are referring you to Dermatology  follow up Dr. Kriste Basque  As planned and As needed

## 2012-02-23 ENCOUNTER — Telehealth: Payer: Self-pay | Admitting: Pulmonary Disease

## 2012-02-23 MED ORDER — METHYLPREDNISOLONE 4 MG PO KIT: PACK | ORAL | Status: DC

## 2012-02-23 MED ORDER — AMOXICILLIN-POT CLAVULANATE 875-125 MG PO TABS: 1.0000 | ORAL_TABLET | Freq: Two times a day (BID) | ORAL | Status: DC

## 2012-02-23 NOTE — Telephone Encounter (Signed)
i spoke with Anthony Russell. C/o cough w/ light green phlem, PND, runny nose, nasal congestion, facial pressure, ear pressure, sneezing, fatigue off and on x 2 weeks. No f/c/s/n/v. He has been taking nyquil and dayquil severe cold and flu. Please advise SN thanks Last OV 02/09/12 w/ TP 07/05/11 w/ SN No pedning OV No Known Allergies

## 2012-02-23 NOTE — Telephone Encounter (Signed)
Per SN---must stop all smoking, augmentin 875 mg  #14  1 po bid, medrol dosepak  #1  Take as directed.  Called and spoke with pt and he is aware and nothing further is needed.

## 2012-06-17 ENCOUNTER — Telehealth: Payer: Self-pay | Admitting: Pulmonary Disease

## 2012-06-17 MED ORDER — METHYLPREDNISOLONE 4 MG PO KIT: PACK | ORAL | Status: DC

## 2012-06-17 NOTE — Telephone Encounter (Signed)
Pt called back re: same. Wants to hear back soon. Anthony Russell

## 2012-06-17 NOTE — Telephone Encounter (Signed)
Per SN---  Use mucinex 2 po bid  Increase fluids Nasal saline spray otc antihistamine like allegra, zyrtec Medrol dosepak  #1  Take as directed

## 2012-06-17 NOTE — Telephone Encounter (Signed)
I spoke with pt and is aware of SN recs. RX has been called in. Nothing further was needed 

## 2012-06-17 NOTE — Telephone Encounter (Signed)
I spoke with pt. He c/o dry cough but occasionally will bring up some phlem (but mainly dry), chest congestion, tickle in chest when he coughs, wheezing, chest tx, sob at times, slight nasal congestion, slight PND x few weeks. Requesting recs. He has been taking OTC allergy meds w/o relief. Please advise SN thanks Last OV 02/09/12 No pending appt No Known Allergies

## 2012-06-28 ENCOUNTER — Telehealth: Payer: Self-pay | Admitting: Pulmonary Disease

## 2012-06-28 MED ORDER — SERTRALINE HCL 50 MG PO TABS: 50.0000 mg | ORAL_TABLET | Freq: Every day | ORAL | Status: DC

## 2012-06-28 NOTE — Telephone Encounter (Signed)
Patient returning call.  Patient aware rx has been sent to pharmacy.  Nothing else needed. [facility]

## 2012-06-28 NOTE — Telephone Encounter (Signed)
lmomtcb x1 for pt rx sent 

## 2012-08-05 ENCOUNTER — Telehealth: Payer: Self-pay | Admitting: Pulmonary Disease

## 2012-08-05 NOTE — Telephone Encounter (Signed)
Called and spoke with pt and he stated that he has the records from his pediatric doctor and this has his shot records in it.  i have filled out the form as much as we can with this information but pt stated that he is sure that he has had a tdap since 1997, but he is not sure of the last one.  Pt is aware that i will forward this information to SN to see what else will need to be done.

## 2012-08-05 NOTE — Telephone Encounter (Signed)
Pt returned call. Anthony Russell  

## 2012-08-05 NOTE — Telephone Encounter (Signed)
Please advise leigh thanks 

## 2012-08-05 NOTE — Telephone Encounter (Signed)
lmomtcb for pt to discuss this vaccination form.

## 2012-08-06 NOTE — Telephone Encounter (Signed)
Pt returned call.  Holly D Pryor ° °

## 2012-08-06 NOTE — Telephone Encounter (Signed)
Per SN---  recs for the pt to come in and get tdap vaccine at his convenience.  thanks

## 2012-08-06 NOTE — Telephone Encounter (Signed)
ATC patient no answer LMOMTCB 

## 2012-08-06 NOTE — Telephone Encounter (Signed)
lmomtcb x1 

## 2012-08-07 NOTE — Telephone Encounter (Signed)
Pt advised and states he will call when he is able to come to get placed on schedule. Carron Curie, CMA

## 2012-08-09 ENCOUNTER — Telehealth: Payer: Self-pay | Admitting: Pulmonary Disease

## 2012-08-09 MED ORDER — SERTRALINE HCL 50 MG PO TABS: 50.0000 mg | ORAL_TABLET | Freq: Every day | ORAL | Status: DC

## 2012-08-09 NOTE — Telephone Encounter (Signed)
Called and spoke with pts mother and she is aware of appt has been made for the pt to come in on Tuesday for tdap for college.  i will give this to the pt once he comes in.  Mother stated that the pt has moved in with her and he needs refills of zoloft.  i advised her that the pt has refills at his pharmacy at [facility].  She stated that [facility] to [facility] [address].  i have called [facility] and cancelled all refills of the zoloft and called this in to [address].

## 2012-08-13 ENCOUNTER — Ambulatory Visit (INDEPENDENT_AMBULATORY_CARE_PROVIDER_SITE_OTHER): Payer: BC Managed Care – PPO

## 2012-08-13 DIAGNOSIS — Z23 Encounter for immunization: Secondary | ICD-10-CM

## 2012-09-02 ENCOUNTER — Telehealth: Payer: Self-pay | Admitting: Pulmonary Disease

## 2012-09-02 ENCOUNTER — Encounter: Payer: Self-pay | Admitting: *Deleted

## 2012-09-02 NOTE — Telephone Encounter (Signed)
Letter has been completed with immunizations in the letter.  Called and spoke with pt and he is aware.  Nothing further is needed.

## 2012-10-04 ENCOUNTER — Telehealth: Payer: Self-pay | Admitting: Pulmonary Disease

## 2012-10-04 MED ORDER — FLUTICASONE PROPIONATE HFA 110 MCG/ACT IN AERO: 2.0000 | INHALATION_SPRAY | Freq: Two times a day (BID) | RESPIRATORY_TRACT | Status: DC

## 2012-10-04 NOTE — Telephone Encounter (Signed)
Pt informed that refill was sent to pharmacy and f/u appt scheduled with Dr Kriste Basque.

## 2012-10-15 ENCOUNTER — Other Ambulatory Visit (INDEPENDENT_AMBULATORY_CARE_PROVIDER_SITE_OTHER): Payer: BC Managed Care – PPO

## 2012-10-15 ENCOUNTER — Ambulatory Visit (INDEPENDENT_AMBULATORY_CARE_PROVIDER_SITE_OTHER): Payer: BC Managed Care – PPO | Admitting: Pulmonary Disease

## 2012-10-15 ENCOUNTER — Ambulatory Visit (INDEPENDENT_AMBULATORY_CARE_PROVIDER_SITE_OTHER)
Admission: RE | Admit: 2012-10-15 | Discharge: 2012-10-15 | Disposition: A | Discharge status: Home / Self Care | Payer: BC Managed Care – PPO | Source: Ambulatory Visit | Attending: Pulmonary Disease | Admitting: Pulmonary Disease

## 2012-10-15 ENCOUNTER — Encounter: Payer: Self-pay | Admitting: Pulmonary Disease

## 2012-10-15 VITALS — BP 128/62 | HR 96 | Temp 98.2°F | Ht 71.0 in | Wt 260.0 lb

## 2012-10-15 DIAGNOSIS — F329 Major depressive disorder, single episode, unspecified: Secondary | ICD-10-CM | POA: Insufficient documentation

## 2012-10-15 DIAGNOSIS — Z0000 Encounter for general adult medical examination without abnormal findings: Secondary | ICD-10-CM

## 2012-10-15 DIAGNOSIS — E663 Overweight: Secondary | ICD-10-CM

## 2012-10-15 DIAGNOSIS — M545 Low back pain, unspecified: Secondary | ICD-10-CM

## 2012-10-15 DIAGNOSIS — J45909 Unspecified asthma, uncomplicated: Secondary | ICD-10-CM

## 2012-10-15 DIAGNOSIS — J683 Other acute and subacute respiratory conditions due to chemicals, gases, fumes and vapors: Secondary | ICD-10-CM

## 2012-10-15 DIAGNOSIS — R0989 Other specified symptoms and signs involving the circulatory and respiratory systems: Secondary | ICD-10-CM

## 2012-10-15 DIAGNOSIS — K219 Gastro-esophageal reflux disease without esophagitis: Secondary | ICD-10-CM

## 2012-10-15 DIAGNOSIS — R0600 Dyspnea, unspecified: Secondary | ICD-10-CM

## 2012-10-15 DIAGNOSIS — E291 Testicular hypofunction: Secondary | ICD-10-CM

## 2012-10-15 DIAGNOSIS — R0609 Other forms of dyspnea: Secondary | ICD-10-CM

## 2012-10-15 DIAGNOSIS — E785 Hyperlipidemia, unspecified: Secondary | ICD-10-CM

## 2012-10-15 DIAGNOSIS — K3184 Gastroparesis: Secondary | ICD-10-CM

## 2012-10-15 DIAGNOSIS — F32A Depression, unspecified: Secondary | ICD-10-CM

## 2012-10-15 LAB — CBC WITH DIFFERENTIAL/PLATELET
Basophils Absolute: 0 10*3/uL (ref 0.0–0.1)
Eosinophils Absolute: 0.1 10*3/uL (ref 0.0–0.7)
MCHC: 33.3 g/dL (ref 30.0–36.0)
MCV: 89.7 fl (ref 78.0–100.0)
Monocytes Absolute: 0.6 10*3/uL (ref 0.1–1.0)
Neutrophils Relative %: 67.3 % (ref 43.0–77.0)
Platelets: 210 10*3/uL (ref 150.0–400.0)
RDW: 14.5 % (ref 11.5–14.6)
WBC: 7.3 10*3/uL (ref 4.5–10.5)

## 2012-10-15 LAB — BASIC METABOLIC PANEL
BUN: 9 mg/dL (ref 6–23)
Chloride: 105 mEq/L (ref 96–112)
Potassium: 4 mEq/L (ref 3.5–5.1)

## 2012-10-15 LAB — HEPATIC FUNCTION PANEL
ALT: 52 U/L (ref 0–53)
AST: 28 U/L (ref 0–37)
Alkaline Phosphatase: 62 U/L (ref 39–117)
Bilirubin, Direct: 0 mg/dL (ref 0.0–0.3)
Total Bilirubin: 0.4 mg/dL (ref 0.3–1.2)

## 2012-10-15 LAB — TSH: TSH: 1.11 u[IU]/mL (ref 0.35–5.50)

## 2012-10-15 LAB — LIPID PANEL: VLDL: 54 mg/dL — ABNORMAL HIGH (ref 0.0–40.0)

## 2012-10-15 MED ORDER — PANTOPRAZOLE SODIUM 40 MG PO TBEC: 40.0000 mg | DELAYED_RELEASE_TABLET | Freq: Two times a day (BID) | ORAL | Status: DC

## 2012-10-15 MED ORDER — SERTRALINE HCL 100 MG PO TABS: 100.0000 mg | ORAL_TABLET | Freq: Every day | ORAL | Status: DC

## 2012-10-15 MED ORDER — ALBUTEROL SULFATE HFA 108 (90 BASE) MCG/ACT IN AERS: 2.0000 | INHALATION_SPRAY | Freq: Four times a day (QID) | RESPIRATORY_TRACT | Status: DC | PRN

## 2012-10-15 NOTE — Patient Instructions (Addendum)
Today we updated your med list in our EPIC system...    Continue your current medications the same...    Check w/ [facility] in [place] to see if you can save $$ on your prescriptions...  Today we did a follow up CXR & EKG... Please return to our lab one morning this week for your FASTING blood work...    We will contact you w/ the results when available...   For your GI tract, reflux/heartburn/ and gastroparesis>>     Take the PROTONIX 40mg  twice daily (take it before the 1st & last meals of the day)...    Elevate the head of your bed on 6" blocks...    Do not eat or drink much after dinner in the eve (& you need to eat before 7PM)...    Take an OTC PROBIOTIC like ALIGN daily...  For your breathing, energy, etc>>     OK to take a men's formula Multivit daily...    We are checking for Low-T & will prescribe accordingly...    You need to start on a gradual exercise program (ie- at the [facility] or at the [employer]) w/ a trainer to guide you & do it regularly...  Call for any questions...  Let's plan a follow up visit in 73mo, sooner if needed for problems.Marland KitchenMarland Kitchen

## 2012-10-15 NOTE — Progress Notes (Signed)
Subjective:     Patient ID: Anthony Russell, male   DOB: 1980/10/14, 32 y.o.   MRN: [id number]  HPI 32 y/o WM here for a follow up visit...   ~  Jan10:  I last saw him 12/07 for an add-on visit due to sore throat... he has hx of Overweight and Hyperlipidemia- on diet Rx alone... he saw TParrett,NP recently w/ atypical CP, reflux symptoms, abd gas complaints- he has CXR= clear, WNL;  EKG= NSR, WNL;  Labs= normal;  and was seen in [facility] w/ AbdSonar said to be normal as well... he was treated w/ Protonix, Pepcid, anti-gas meds w/ some improvement... appt sched w/ GI- DrKaplan next week.  ~  July 05, 2011:  3 year ROV & Anthony Russell is here for check up & refill of meds;  He tells me he hurt his back at work [employer]) & was fired, he sued the [employer] for [employer] won that & a lump sum; he has prev hx LumbarLam 2007 by [employer] & needs f/u eval (he will set this up on his own)... He does not have insurance at present & requests NO CXR, EKG, Lab work...    His breathing has been good on Flovent Bid & Proair as needed, he denies URI or exac;  He takes Protonix for reflux & symptoms are controlled;  Also on Zoloft50 but just uses it as needed for depression...  We reviewed prob list, meds, xrays and labs> see below>>  ~  October 15, 2012:  18mo ROV & CPX> Anthony Russell notes that he is at [employer] now [employer]) & having to walk a lot w/ some SOB noted, heart racing, bilat leg pain (now w/ new insoles for shoes); he notes some days it's hard to get OOB even w/ the Zoloft; We reviewed the following medical problems during today's office visit >>     AR/ RADS> on Zyrtek10, FloventHFA-2spBid, AlbutHFA prn;  Notes worse SOB in hot/ humid weather- try Singulair10mg /d...    Hx AtypCP> he notes SOB w/ activ but no CP; he is out-of-shape w/o exercise proir to walking at school; advised gym work outs & regular physical activity...    Hyperlipid> on diet alone; Labs 9/14 showed TChol 172, TG 270, HDL 28, LDL 166... He  declines med rx, therefore needs diet/ exerc/ wt reduction...    Overweight> weight = 260#,  71" tall,  BMI=36;  He says he was down to 230#;  We reviewed diet, exercise, wt reduction strategies...    GERD> on Protonix40;  He notes incr reflux symptoms and is rec to increase the Protonix to 40mg Bid + vigorous antireflux regimen......    Hypogonadism> not on hormone therapy;  Notes energy poor, hard to get up & go, no drive etc;  Testos level 4/09 = 201, start Androgel 2pumps w/ f/u lab if he'll stick w/ it.    LBP> s/p LLam 2007, no recent problem & we reviewed exercises to help back & strengthen abs...    Anxiety/ Depression> on Zoloft50;  He notes continued symptoms, offered Psyche referral, he prefers to incr Zoloft 100mg /d... We reviewed prob list, meds, xrays and labs> see below for updates >> he refused the 2014 Flu vaccine... CXR 9/14 showed norm heart size, clear lungs, wnl/NAD.Marland KitchenMarland Kitchen EKG 9/14 showed NSR, rate 84, wnl/NAD... LABS 9/14:  FLP- parameters are off on diet alone;  Chems- ok x BS=111;  CBC- wnl;  TSH=1.11;  VitD=25:  Testos=201...    Problems List:  ALLERGY (ICD-995.3) - Rx w/ OTC antihistamines Prn.  Hx of UPPER RESPIRATORY INFECTION - he is an active smoker... Prev URIs treated w/ ZPak in past. ASTHMATIC BRONCHITIS >> on FLOVENT 110- 2 puffs Bid, PROAIR HFA 1-2 sp as needed... ~  CXR 12/09 showed normal heart size, clear lungs, NAD... ~  9/14: on Zyrtek10, FloventHFA-2spBid, AlbutHFA prn;  Notes worse SOB in hot/ humid weather- try Singulair10mg /d. ~  CXR 9/14 showed norm heart size, clear lungs, wnl/NAD...  CHEST PAIN, ATYPICAL (ICD-786.59) - prev neg eval w/ normal CXR/ EKG...  HYPERLIPIDEMIA (ICD-272.4) - on diet alone... ~  FLP 9/07 showed TChol 154, TG 183, HDL 23, LDL 95... rec- diet + exercise, may need Fibrate. ~  9/14: on diet alone; Labs 9/14 showed TChol 172, TG 270, HDL 28, LDL 166... He declines med rx, therefore needs diet/ exerc/ wt  reduction.  OVERWEIGHT (ICD-278.02) - we discussed diet & exercise program... ~  9/07:  Weight = 225# ~  1/10:  Weight = 246# ~  6/13:  Weight = 258# ~  9/14:  Weight = 260#  GERD (ICD-530.81) & DYSPEPSIA (ICD-536.8) - on PROTONIX 40mg /d... ~  UGI series 7/04 showed GE reflux w/o HH seen... ~  AbdSonar 4/09 was normal... ~  Jan10:  Protonix incr to Bid and referred to GI- DrKaplan... ~  EGD 3/10 by DrKaplan was WNL.[name]. ~  Gastric Emptying Scan 3/10 showed 73% retention at 1H & 63% at 2H (norm <30% at [facility]). ~  9/14: on Protonix40;  He notes incr reflux symptoms and is rec to increase the Protonix to 40mg Bid + vigorous antireflux regimen  HYPOGONADISM >>  ~  Labs 4/10 showed Testos level = 233, rec to take Topical Testos replacement Rx but he declined... ~  Labs 9/14 showed Testos = 201 and Rx for ANDROGEL 1.62%- 2 pumps daily...  Hx of BACK PAIN, LUMBAR (ICD-724.2) - s/p lumbar laminectomy 2007 by DrCabbell.  ANXIETY & DEPRESSION >> on ZOLOFT 50mg  but he prefers to take it just as needed when depressed. ~  9/14: on Zoloft50;  He notes continued symptoms, offered Psyche referral, he prefers to incr Zoloft 100mg /d...  DERM >>  ~  1/14:  He had an irritated verruca removed by PA at DrLupton's office...  HEALTH MAINTENANCE >> ~  GI:  Hx GERD & rec to take Protonix40Bid + vigorous antireflux regimen... ~  GU:  Hx Hypogonadism & rec to take Androgel 1.62%- 2 pumps daily w/ f/u labs... ~  Immuniz:  He refuses the Flu vaccines;  He received the TDAP 7/14;  We reviewed the current rec indications for Pneumovax & Shingles vax...   Past Surgical History  Procedure Laterality Date  . Lumbar laminectomy  2007    DrCabbell    Outpatient Encounter Prescriptions as of 10/15/12  Medication Sig Dispense Refill  . Calcium-Magnesium-Zinc 500-250-12.5 MG TABS Take by mouth 3 (three) times daily.        . cetirizine (ZYRTEC ALLERGY) 10 MG tablet Take 10 mg by mouth daily as needed.       .  fluticasone (FLOVENT HFA) 110 MCG/ACT inhaler Inhale 2 puffs into the lungs 2 (two) times daily.  1 Inhaler  11  . guaiFENesin (MUCINEX) 600 MG 12 hr tablet Take 600 mg by mouth daily.        . pantoprazole (PROTONIX) 40 MG tablet Take 1 tablet (40 mg total) by mouth daily.  30 tablet  11  . sertraline (ZOLOFT) 50 MG tablet Take  1 tablet (50 mg total) by mouth daily.  30 tablet  11  . albuterol (PROVENTIL HFA;VENTOLIN HFA) 108 (90 BASE) MCG/ACT inhaler Inhale 2 puffs into the lungs every 6 (six) hours as needed for wheezing.  1 Inhaler  11    No Known Allergies   Current Medications, Allergies, Past Medical History, Past Surgical History, Family History, and Social History were reviewed in [employer] record.   Review of Systems        The patient complains of severe indigestion/heartburn.  The patient denies anorexia, fever, weight loss, weight gain, vision loss, decreased hearing, hoarseness, chest pain, syncope, dyspnea on exertion, peripheral edema, prolonged cough, headaches, hemoptysis, abdominal pain, melena, hematochezia, hematuria, incontinence, muscle weakness, suspicious skin lesions, transient blindness, difficulty walking, depression, unusual weight change, abnormal bleeding, enlarged lymph nodes, and angioedema >> all other systems neg except as noted...    Objective:   Physical Exam    WD, Overweight, 32 y/o WM in NAD... flat affect... GENERAL:  Alert & oriented; pleasant & cooperative... HEENT:  [place]/AT, EOM-wnl, PERRLA, EACs-clear, TMs-wnl, NOSE-clear, THROAT-clear & wnl. NECK:  Supple w/ fairROM; no JVD; normal carotid impulses w/o bruits; no thyromegaly or nodules palpated; no lymphadenopathy. CHEST:  Clear to P & A; without wheezes/ rales/ or rhonchi. HEART:  Regular Rhythm; without murmurs/ rubs/ or gallops. ABDOMEN:  Soft w/ min epig tender on palp; normal bowel sounds; no organomegaly or masses detected. EXT: without deformities or arthritic  changes; no varicose veins/ venous insuffic/ or edema. NEURO:  CN's intact; motor testing normal; sensory testing normal; gait normal & balance OK. DERM:  No lesions noted; no rash etc...  RADIOLOGY DATA:  Reviewed in the EPIC EMR & discussed w/ the patient...  LABORATORY DATA:  Reviewed in the EPIC EMR & discussed w/ the patient...   Assessment:     CPX>>    Hx AR/ AB>  Stable on Flovent 100 and Proair HFA, add trial Singulair in the hot weather...  Hx Atyp CP>  Neg eval 6/12 w/ normal CXR, EKG...  Hyperlipid>  numbers not at goals on diet alone, must lose wt or start meds...  Overweight>  We reviewed diet & exercise needed...  GERD>  He had gastroparesis on emptying scan & remains on Protonix & antireflux regimen...  Hx LBP>  He is s/p LLam 2007 by DrCabbell...  Anxiety/ Depression>  On Zoloft as needed...     Plan:     Patient's Medications  New Prescriptions   AZITHROMYCIN (ZITHROMAX Z-PAK) 250 MG TABLET    Take as directed   TESTOSTERONE 20.25 MG/1.25GM (1.62%) GEL    Place 2 Squirts onto the skin daily.   VENLAFAXINE XR (EFFEXOR XR) 75 MG 24 HR CAPSULE    Start with 1 daily x 2 weeks then increase to 1 by mouth twice daily thereafter  Previous Medications   CALCIUM-MAGNESIUM-ZINC 500-250-12.5 MG TABS    Take by mouth 3 (three) times daily.     CETIRIZINE (ZYRTEC ALLERGY) 10 MG TABLET    Take 10 mg by mouth daily as needed.    FLUTICASONE (FLOVENT HFA) 110 MCG/ACT INHALER    Inhale 2 puffs into the lungs 2 (two) times daily.   GUAIFENESIN (MUCINEX) 600 MG 12 HR TABLET    Take 600 mg by mouth daily.    Modified Medications   Modified Medication Previous Medication   ALBUTEROL (PROVENTIL HFA;VENTOLIN HFA) 108 (90 BASE) MCG/ACT INHALER albuterol (PROVENTIL HFA;VENTOLIN HFA) 108 (90 BASE) MCG/ACT inhaler  Inhale 2 puffs into the lungs every 6 (six) hours as needed for wheezing.    Inhale 2 puffs into the lungs every 6 (six) hours as needed for wheezing.   PANTOPRAZOLE  (PROTONIX) 40 MG TABLET pantoprazole (PROTONIX) 40 MG tablet      Take 1 tablet (40 mg total) by mouth 2 (two) times daily.    Take 1 tablet (40 mg total) by mouth daily.  Discontinued Medications   AMOXICILLIN-CLAVULANATE (AUGMENTIN) 875-125 MG PER TABLET    Take 1 tablet by mouth 2 (two) times daily.   METHYLPREDNISOLONE (MEDROL, PAK,) 4 MG TABLET    follow package directions   SERTRALINE (ZOLOFT) 100 MG TABLET    Take 1 tablet (100 mg total) by mouth daily.   SERTRALINE (ZOLOFT) 50 MG TABLET    Take 1 tablet (50 mg total) by mouth daily.

## 2012-10-16 ENCOUNTER — Telehealth: Payer: Self-pay | Admitting: Pulmonary Disease

## 2012-10-16 LAB — VITAMIN D 25 HYDROXY (VIT D DEFICIENCY, FRACTURES): Vit D, 25-Hydroxy: 25 ng/mL — ABNORMAL LOW (ref 30–89)

## 2012-10-16 MED ORDER — TESTOSTERONE 20.25 MG/1.25GM (1.62%) TD GEL: 2.0000 | Freq: Every day | TRANSDERMAL | Status: DC

## 2012-10-16 NOTE — Telephone Encounter (Signed)
rx has been sent to the pharmacy per pts request.   

## 2012-10-16 NOTE — Telephone Encounter (Signed)
Result Note    Please notify patient>    FLP w/ elev TG & low HDL> need low fat diet, exercise, 7 weight reduction!!!   Chems- ok x BS=111 borderline> needs low carb diet & wt redction!!!   LFTs, CBC, Thyroid> ALL WNL.Marland KitchenMarland Kitchen   Vit D is low & needs Men's formula MVI daily + extra VitD ~2000u daily...   Testos level is confirmed low> I rec ANDROGEL 1.62% 2 pumps rubbed in daily w/ f/u Testos level in 608 weeks, mark calendar 7 come to lab   I spoke with patient about results and he verbalized understanding and had no questions. RX printed for SN to sign. Will forward to leigh once done

## 2012-11-27 ENCOUNTER — Telehealth: Payer: Self-pay | Admitting: Pulmonary Disease

## 2012-11-27 NOTE — Telephone Encounter (Signed)
Not sure what is needed and he contact number rings a few times then you get a dial tone. I cannot fax anything until I know what is needed. Without a contact # we cannot do anything. I will close the message and await a call back. Carron Curie, CMA

## 2012-11-28 ENCOUNTER — Telehealth: Payer: Self-pay | Admitting: Pulmonary Disease

## 2012-11-28 NOTE — Telephone Encounter (Signed)
LMOM with Amanda/[employer] x1

## 2012-11-29 NOTE — Telephone Encounter (Signed)
LMTCBx2. Jennifer Castillo, CMA  

## 2012-12-02 NOTE — Telephone Encounter (Signed)
LMTCBx3 for [employer]. Carron Curie, CMA

## 2012-12-03 ENCOUNTER — Telehealth: Payer: Self-pay | Admitting: Pulmonary Disease

## 2012-12-03 MED ORDER — VENLAFAXINE HCL ER 75 MG PO CP24: ORAL_CAPSULE | ORAL | Status: DC

## 2012-12-03 MED ORDER — AZITHROMYCIN 250 MG PO TABS: ORAL_TABLET | ORAL | Status: DC

## 2012-12-03 NOTE — Telephone Encounter (Signed)
Per protocol I will sign off on message. Zamia Tyminski, CMA  

## 2012-12-03 NOTE — Telephone Encounter (Signed)
Zoloft increased from 50mg  to 100mg  daily at the 9.16.14 ov w/ SN  Called spoke with patient who reports that he does not feel that the Zoloft is working for him anymore.  Pt stated that he does not feel driven, has no energy or desire to do anything x2 months.  Pt denies any suicidal or homicidal thoughts or tendencies.  Pt did run out of this medication 3 days ago and has since been experiencing some light-headedness.  Advised pt that this type of medication should not be stopped abruptly, but tapered instead and advised that his light-headedness could be a result from this.  Pt verbalized his understanding.  Also, pt c/o sore throat, head congestion, some light green drainage, PND, some prod cough with light green mucus, diarrhea x4-5days - denies f/c/s, nausea, vomiting, hemoptysis, edema.  [facility] [address] No Known Allergies  Dr Kriste Basque please advise, thank you.

## 2012-12-03 NOTE — Telephone Encounter (Signed)
Per SN--  SN will agree to change the zoloft to effexor 75 mg  #60   Start with 1 daily x 2 weeks then increase to 1 po bid thereafter.  If not response he will need appt with psychiatry and he will need to call for this appt.   Can call in zpak #1  Take as directed and take align once daily with the abx.  thanks

## 2012-12-03 NOTE — Telephone Encounter (Signed)
Called, spoke with pt.  Informed him of below per Dr. Kriste Basque. He verbalized understanding of these instructions and is aware [facility].  He verbalized understanding and voiced no further questions or concerns at this time.  Med list updated to reflect med changes.

## 2012-12-05 ENCOUNTER — Other Ambulatory Visit: Payer: Self-pay

## 2012-12-19 ENCOUNTER — Ambulatory Visit: Payer: BC Managed Care – PPO | Admitting: Pulmonary Disease

## 2013-01-09 ENCOUNTER — Ambulatory Visit: Payer: BC Managed Care – PPO | Admitting: Pulmonary Disease

## 2013-01-10 ENCOUNTER — Telehealth: Payer: Self-pay | Admitting: Pulmonary Disease

## 2013-01-10 NOTE — Telephone Encounter (Signed)
Per SN-zofran 4 mg #30 take 1 po every 6 hours prn nausea no refills, OTC Imodium for Diarrhea, OTC Align 1 po qd, clear liquid diet for now.

## 2013-01-10 NOTE — Telephone Encounter (Signed)
[id number] 

## 2013-01-10 NOTE — Telephone Encounter (Signed)
Pt c/o stomach discomfort for past 2-3 days.  C/o diarrhea, nausea and cramping.  Denies fever or vomiting.  Taking Pepto and tums without relief.  Pt's mother is having similar symptoms.  Please advise.

## 2013-01-13 ENCOUNTER — Encounter: Payer: Self-pay | Admitting: Pulmonary Disease

## 2013-01-13 NOTE — Telephone Encounter (Signed)
Mother called back. She is aware of recs. They will call pt GI doc to be seen. Nothing further needed

## 2013-01-13 NOTE — Telephone Encounter (Signed)
LMTCBx1 on only number in chart, also called pt spouse. No meds have been sent. Do they still need them? Carron Curie, CMA

## 2013-01-15 ENCOUNTER — Other Ambulatory Visit (INDEPENDENT_AMBULATORY_CARE_PROVIDER_SITE_OTHER): Payer: BC Managed Care – PPO

## 2013-01-15 ENCOUNTER — Encounter: Payer: Self-pay | Admitting: Gastroenterology

## 2013-01-15 ENCOUNTER — Ambulatory Visit (INDEPENDENT_AMBULATORY_CARE_PROVIDER_SITE_OTHER): Payer: BC Managed Care – PPO | Admitting: Gastroenterology

## 2013-01-15 VITALS — BP 120/86 | HR 80 | Temp 98.4°F | Ht 68.0 in | Wt 268.8 lb

## 2013-01-15 DIAGNOSIS — R110 Nausea: Secondary | ICD-10-CM

## 2013-01-15 DIAGNOSIS — R112 Nausea with vomiting, unspecified: Secondary | ICD-10-CM

## 2013-01-15 DIAGNOSIS — R197 Diarrhea, unspecified: Secondary | ICD-10-CM

## 2013-01-15 DIAGNOSIS — R109 Unspecified abdominal pain: Secondary | ICD-10-CM

## 2013-01-15 LAB — BASIC METABOLIC PANEL
CO2: 31 mEq/L (ref 19–32)
Calcium: 9.4 mg/dL (ref 8.4–10.5)
Creatinine, Ser: 1.1 mg/dL (ref 0.4–1.5)
GFR: 80.57 mL/min (ref >60.00)
Glucose, Bld: 161 mg/dL — ABNORMAL HIGH (ref 70–99)
Potassium: 3.8 mEq/L (ref 3.5–5.1)

## 2013-01-15 LAB — CBC
HCT: 43.5 % (ref 39.0–52.0)
Hemoglobin: 14.9 g/dL (ref 13.0–17.0)
MCHC: 34.2 g/dL (ref 30.0–36.0)
RDW: 14.1 % (ref 11.5–14.6)

## 2013-01-15 MED ORDER — ONDANSETRON 4 MG PO TBDP: 4.0000 mg | ORAL_TABLET | Freq: Three times a day (TID) | ORAL | Status: DC | PRN

## 2013-01-15 MED ORDER — METRONIDAZOLE 250 MG PO TABS: 250.0000 mg | ORAL_TABLET | Freq: Three times a day (TID) | ORAL | Status: DC

## 2013-01-15 NOTE — Patient Instructions (Signed)
We have sent the following medications to your pharmacy for you to pick up at your convenience: Zofran & Flagyl, please take all medications as prescribed.  Your physician has requested that you go to the basement for  lab work before leaving today                                               We are excited to introduce MyChart, a new best-in-class service that provides you online access to important information in your electronic medical record. We want to make it easier for you to view your health information - all in one secure location - when and where you need it. We expect MyChart will enhance the quality of care and service we provide.  When you register for MyChart, you can:    View your test results.    Request appointments and receive appointment reminders via email.    Request medication renewals.    View your medical history, allergies, medications and immunizations.    Communicate with your physician's office through a password-protected site.    Conveniently print information such as your medication lists.  To find out if MyChart is right for you, please talk to a member of our clinical staff today. We will gladly answer your questions about this free health and wellness tool.  If you are age 32 or older and want a member of your family to have access to your record, you must provide written consent by completing a proxy form available at our office. Please speak to our clinical staff about guidelines regarding accounts for patients younger than age 32.  As you activate your MyChart account and need any technical assistance, please call the MyChart technical support line at [phone]) or email your question to [email or web address]. If you email your question(s), please include your name, a return phone number and the best time to reach you.  If you have non-urgent health-related questions, you can send a message to our office through MyChart at  [email or web address]. If you have a medical emergency, call 911.  Thank you for using MyChart as your new health and wellness resource!   MyChart licensed from [employer],  4540-9811. Patents Pending.

## 2013-01-15 NOTE — Progress Notes (Signed)
01/15/2013 ACE BERGFELD [id number] 1980/04/16   HISTORY OF PRESENT ILLNESS:  Patient is a 32 year old male who presents to our office today with two week complaints of diarrhea, abdominal cramping, and nausea. He and his mother live together and have the same symptoms. Someone that his mother works with was diagnosed with the "stomach flu".  Also, his mother is concerned about food poisoning because their refrigerator has not been working as well as it should. Diarrhea is only 1-2 times a day, non-bloody. There has been no vomiting but a lot of nausea and abdominal cramping. No fever, but some chills.  His mother called her PCP's office and was given prescriptions for phenergan for nausea and bentyl for abdominal cramping, which did not help and when she called PCP's office back they told her to see GI so she made an appointment for her son as well.  Denies any recent travel. He has been drinking liquids.    Past Medical History  Diagnosis Date  . Allergy, unspecified not elsewhere classified     rx w/ OTC antihistamines PRN  . Atypical chest pain     recent neg eval w/ normal cxr/ekg  . Hyperlipidemia     on diet alone  . Overweight(278.02)     weight Jan10=246#.[name].he was 225# in 9/07...diet and exercise was discussed  . GERD (gastroesophageal reflux disease)     on protonix 40mg /d  . Dyspepsia     on protonix 40mg /d  . Lumbar back pain     s/p lumbar laminectomy 2007 by Doreene Adas   Past Surgical History  Procedure Laterality Date  . Lumbar laminectomy  2007    DrCabbell    reports that he quit smoking about 22 months ago. His smoking use included Cigarettes. He has a .8 pack-year smoking history. He has never used smokeless tobacco. He reports that he drinks alcohol. He reports that he does not use illicit drugs. family history includes Allergies in his brother, father, and mother; Irritable bowel syndrome in his brother and mother. There is no history of Colon cancer. No Known  Allergies    Outpatient Encounter Prescriptions as of 01/15/2013  Medication Sig  . albuterol (PROVENTIL HFA;VENTOLIN HFA) 108 (90 BASE) MCG/ACT inhaler Inhale 2 puffs into the lungs every 6 (six) hours as needed for wheezing.  [name] azithromycin (ZITHROMAX Z-PAK) 250 MG tablet Take as directed  . Calcium-Magnesium-Zinc 500-250-12.5 MG TABS Take by mouth 3 (three) times daily.    . cetirizine (ZYRTEC ALLERGY) 10 MG tablet Take 10 mg by mouth daily as needed.   . fluticasone (FLOVENT HFA) 110 MCG/ACT inhaler Inhale 2 puffs into the lungs 2 (two) times daily.  [name] guaiFENesin (MUCINEX) 600 MG 12 hr tablet Take 600 mg by mouth daily.    . pantoprazole (PROTONIX) 40 MG tablet Take 1 tablet (40 mg total) by mouth 2 (two) times daily.  . Testosterone 20.25 MG/1.25GM (1.62%) GEL Place 2 Squirts onto the skin daily.  [name] venlafaxine XR (EFFEXOR XR) 75 MG 24 hr capsule Start with 1 daily x 2 weeks then increase to 1 by mouth twice daily thereafter  . metroNIDAZOLE (FLAGYL) 250 MG tablet Take 1 tablet (250 mg total) by mouth 3 (three) times daily.  . ondansetron (ZOFRAN ODT) 4 MG disintegrating tablet Take 1 tablet (4 mg total) by mouth every 8 (eight) hours as needed for nausea or vomiting.     REVIEW OF SYSTEMS  : All other systems reviewed and negative except where noted  in the History of Present Illness.   PHYSICAL EXAM: BP 120/86  Pulse 80  Temp(Src) 98.4 F (36.9 C) (Oral)  Ht 5\' 8"  (1.727 m)  Wt 268 lb 12.8 oz (121.927 kg)  BMI 40.88 kg/m2 General: Well developed white male in no acute distress Head: Normocephalic and atraumatic Eyes:  Sclerae anicteric, conjunctiva pink. Ears: Normal auditory acuity.  Lungs: Clear throughout to auscultation Heart: Regular rate and rhythm Abdomen: Soft, non-distended. No masses or hepatomegaly noted. Normal bowel sounds.  Mild mid-abdominal TTP without R/R/G. Musculoskeletal: Symmetrical with no gross deformities  Skin: No lesions on visible  extremities Extremities: No edema  Neurological: Alert oriented x 4, grossly non-focal Psychological:  Alert and cooperative. Normal mood and affect  ASSESSMENT AND PLAN: -2 weeks of nausea, abdominal cramping, diarrhea: He and his mother have the same symptoms. This is likely something infectious. Will check CBC, CMP, and stool pathogen panel. Will give prescription for zofran. Will treat empirically with flagyl 250 mg TID for ten days

## 2013-01-16 ENCOUNTER — Other Ambulatory Visit: Payer: BC Managed Care – PPO

## 2013-01-16 DIAGNOSIS — R197 Diarrhea, unspecified: Secondary | ICD-10-CM

## 2013-01-17 LAB — GASTROINTESTINAL PATHOGEN PANEL PCR
C. difficile Tox A/B, PCR: NEGATIVE
Cryptosporidium, PCR: NEGATIVE
E coli (STEC) stx1/stx2, PCR: NEGATIVE
E coli 0157, PCR: NEGATIVE
Giardia lamblia, PCR: NEGATIVE
Norovirus, PCR: NEGATIVE
Rotavirus A, PCR: NEGATIVE
Salmonella, PCR: NEGATIVE
Shigella, PCR: NEGATIVE

## 2013-01-20 ENCOUNTER — Telehealth: Payer: Self-pay | Admitting: Gastroenterology

## 2013-01-20 ENCOUNTER — Telehealth: Payer: Self-pay | Admitting: *Deleted

## 2013-01-20 MED ORDER — PROMETHAZINE HCL 12.5 MG PO TABS: 12.5000 mg | ORAL_TABLET | Freq: Four times a day (QID) | ORAL | Status: DC | PRN

## 2013-01-20 NOTE — Telephone Encounter (Signed)
Spoke with patient and he states he is taking Flagyl and Zofran. He would like to try Phenergan. ?dose

## 2013-01-20 NOTE — Telephone Encounter (Signed)
Spoke with patient and gave him negative stool study. He states he is still having nausea and "sick to stomach." Please, advise.

## 2013-01-20 NOTE — Telephone Encounter (Signed)
He was also given zofran and flagyl.  Is he taking those?  If zofran is not helping then we can give him some phenergan to use instead.  Thank you,  Jess

## 2013-01-20 NOTE — Telephone Encounter (Signed)
Phenergan as we discussed.  Jess

## 2013-01-20 NOTE — Progress Notes (Signed)
Reviewed and agree with management. Kaylia Winborne D. Welborn Keena, M.D., FACG  

## 2013-01-20 NOTE — Telephone Encounter (Signed)
Per Doug Sou, PA Phenergan 12.5 mg po every 6 hours prn nausea.

## 2013-02-10 NOTE — Progress Notes (Signed)
Stool studies are negative and white count is normal then I would discontinue Flagyl

## 2013-03-06 ENCOUNTER — Ambulatory Visit: Payer: BC Managed Care – PPO | Admitting: Pulmonary Disease

## 2013-03-07 ENCOUNTER — Other Ambulatory Visit (INDEPENDENT_AMBULATORY_CARE_PROVIDER_SITE_OTHER): Payer: BC Managed Care – PPO

## 2013-03-07 ENCOUNTER — Encounter: Payer: Self-pay | Admitting: Adult Health

## 2013-03-07 ENCOUNTER — Ambulatory Visit (INDEPENDENT_AMBULATORY_CARE_PROVIDER_SITE_OTHER): Payer: BC Managed Care – PPO | Admitting: Adult Health

## 2013-03-07 VITALS — BP 124/68 | HR 110 | Temp 97.8°F | Ht 71.0 in | Wt 267.8 lb

## 2013-03-07 DIAGNOSIS — R197 Diarrhea, unspecified: Secondary | ICD-10-CM

## 2013-03-07 DIAGNOSIS — R109 Unspecified abdominal pain: Secondary | ICD-10-CM

## 2013-03-07 LAB — HEPATIC FUNCTION PANEL
ALT: 67 U/L — ABNORMAL HIGH (ref 0–53)
AST: 33 U/L (ref 0–37)
Albumin: 3.9 g/dL (ref 3.5–5.2)
Alkaline Phosphatase: 70 U/L (ref 39–117)
BILIRUBIN DIRECT: 0 mg/dL (ref 0.0–0.3)
TOTAL PROTEIN: 7.4 g/dL (ref 6.0–8.3)
Total Bilirubin: 0.7 mg/dL (ref 0.3–1.2)

## 2013-03-07 LAB — URINALYSIS, ROUTINE W REFLEX MICROSCOPIC
BILIRUBIN URINE: NEGATIVE
Hgb urine dipstick: NEGATIVE
Ketones, ur: NEGATIVE
LEUKOCYTES UA: NEGATIVE
Nitrite: NEGATIVE
PH: 6 (ref 5.0–8.0)
RBC / HPF: NONE SEEN (ref 0–?)
SPECIFIC GRAVITY, URINE: 1.025 (ref 1.000–1.030)
TOTAL PROTEIN, URINE-UPE24: NEGATIVE
URINE GLUCOSE: 100 — AB
Urobilinogen, UA: 0.2 (ref 0.0–1.0)

## 2013-03-07 LAB — BASIC METABOLIC PANEL
BUN: 7 mg/dL (ref 6–23)
CALCIUM: 9.2 mg/dL (ref 8.4–10.5)
CHLORIDE: 104 meq/L (ref 96–112)
CO2: 30 mEq/L (ref 19–32)
Creatinine, Ser: 1.1 mg/dL (ref 0.4–1.5)
GFR: 83.95 mL/min (ref >60.00)
GLUCOSE: 115 mg/dL — AB (ref 70–99)
Potassium: 3.7 mEq/L (ref 3.5–5.1)
Sodium: 140 mEq/L (ref 135–145)

## 2013-03-07 LAB — SEDIMENTATION RATE: SED RATE: 14 mm/h (ref 0–22)

## 2013-03-07 LAB — CBC WITH DIFFERENTIAL/PLATELET
Basophils Absolute: 0.1 10*3/uL (ref 0.0–0.1)
Basophils Relative: 0.6 % (ref 0.0–3.0)
EOS PCT: 0.9 % (ref 0.0–5.0)
Eosinophils Absolute: 0.1 10*3/uL (ref 0.0–0.7)
HEMATOCRIT: 45 % (ref 39.0–52.0)
HEMOGLOBIN: 14.9 g/dL (ref 13.0–17.0)
LYMPHS ABS: 1.9 10*3/uL (ref 0.7–4.0)
LYMPHS PCT: 23.7 % (ref 12.0–46.0)
MCHC: 33.2 g/dL (ref 30.0–36.0)
MCV: 91.8 fl (ref 78.0–100.0)
Monocytes Absolute: 0.6 10*3/uL (ref 0.1–1.0)
Monocytes Relative: 7.9 % (ref 3.0–12.0)
Neutro Abs: 5.4 10*3/uL (ref 1.4–7.7)
Neutrophils Relative %: 66.9 % (ref 43.0–77.0)
Platelets: 236 10*3/uL (ref 150.0–400.0)
RBC: 4.91 Mil/uL (ref 4.22–5.81)
RDW: 13.8 % (ref 11.5–14.6)
WBC: 8 10*3/uL (ref 4.5–10.5)

## 2013-03-07 LAB — LIPASE: Lipase: 29 U/L (ref 11.0–59.0)

## 2013-03-07 LAB — AMYLASE: Amylase: 51 U/L (ref 27–131)

## 2013-03-07 NOTE — Assessment & Plan Note (Signed)
Loose stools and nausea Extensive workup by GI for similar symptoms in 12/2012 was unrevealing-  ? PPI causing dirrhea.  Check labs today  Dietary restrictions   Plan  Stop Protonix  Change to Zantac 150mg  Twice daily  . Advance bland diet as tolerated.  No lactose .  Use phenergan As needed  Nausea  Push fluids  Begin Align -Probiotic daily  I will call with lab results.  Please contact office for sooner follow up if symptoms do not improve or worsen or seek emergency care  If not better will need to go back to GI

## 2013-03-07 NOTE — Patient Instructions (Signed)
Stop Protonix  Change to Zantac 150mg  Twice daily  . Advance bland diet as tolerated.  No lactose .  Use phenergan As needed  Nausea  Push fluids  Begin Align -Probiotic daily  I will call with lab results.  Please contact office for sooner follow up if symptoms do not improve or worsen or seek emergency care  If not better will need to go back to GI

## 2013-03-07 NOTE — Progress Notes (Signed)
Subjective:     Patient ID: Anthony Russell, male   DOB: 11/22/1980, 33 y.o.   MRN: [id number]  HPI  33 y/o WM here    03/07/2013 Acute OV  Complains of nausea, abd cramping, loose stools, weakness/fatigue x2 weeks.   Is worse after having lactose foods.  Denies any f/c/s, vomiting, bloody stools Was seen by GI on 12/17 for similar symptoms   Labs were unremarkable . Stool pathogen were neg.  Tx w/ 10 days of flagyl. Says got better until 2 weeks ago.  Has been using phenergan with some help in nausea.  Patient denies any recent travel, antibiotic use. Bloody stools, chest pain, vomiting, or fever.    Problems List:    ALLERGY (ICD-995.3) - Rx w/ OTC antihistamines Prn.  Hx of UPPER RESPIRATORY INFECTION - he is an active smoker... Prev URIs treated w/ ZPak in past. ASTHMATIC BRONCHITIS >> on FLOVENT 110- 2 puffs Bid, PROAIR HFA 1-2 sp as needed... ~  CXR 12/09 showed normal heart size, clear lungs, NAD...  CHEST PAIN, ATYPICAL (ICD-786.59) - prev neg eval w/ normal CXR/ EKG...  HYPERLIPIDEMIA (ICD-272.4) - on diet alone... ~  FLP 9/07 showed TChol 154, TG 183, HDL 23, LDL 95... rec- diet + exercise, may need Fibrate.  OVERWEIGHT (ICD-278.02) - we discussed diet & exercise program... ~  9/07:  Weight = 225# ~  1/10:  Weight = 246# ~  6/13:  Weight = 258#  GERD (ICD-530.81) & DYSPEPSIA (ICD-536.8) - on PROTONIX 40mg /d... ~  UGI series 7/04 showed GE reflux w/o HH seen... ~  AbdSonar 4/09 was normal... ~  Jan10:  Protonix incr to Bid and referred to GI- DrKaplan... ~  EGD 3/10 by DrKaplan was WNL.[name]. ~  Gastric Emptying Scan 3/10 showed 73% retention at 1H & 63% at 2H (norm <30% at [facility]).  HYPOGONADISM >>  ~  Labs 4/10 showed Testos level = 233  Hx of BACK PAIN, LUMBAR (ICD-724.2) - s/p lumbar laminectomy 2007 by DrCabbell.  ANXIETY & DEPRESSION >> on ZOLOFT 50mg  but he prefers to take it just as needed when depressed.   Past Surgical History  Procedure Laterality Date  .  Lumbar laminectomy  2007    DrCabbell  No Known Allergies   Current Medications, Allergies, Past Medical History, Past Surgical History, Family History, and Social History were reviewed in [employer] record.   Review of Systems Constitutional:   No  weight loss, night sweats,  Fevers, chills, fatigue, or  lassitude.  HEENT:   No headaches,  Difficulty swallowing,  Tooth/dental problems, or  Sore throat,                No sneezing, itching, ear ache, nasal congestion, post nasal drip,   CV:  No chest pain,  Orthopnea, PND, swelling in lower extremities, anasarca, dizziness, palpitations, syncope.   GI  No   bloody stools.   Resp: No shortness of breath with exertion or at rest.  No excess mucus, no productive cough,  No non-productive cough,  No coughing up of blood.  No change in color of mucus.  No wheezing.  No chest wall deformity  Skin: no rash or lesions.  GU: no dysuria, change in color of urine, no urgency or frequency.  No flank pain, no hematuria   MS:  No joint pain or swelling.  No decreased range of motion.  No back pain.  Psych:  No change in mood or affect. No depression or  anxiety.  No memory loss.               Objective:   Physical Exam     WD, Overweight, 33 y/o WM in NAD... flat affect... GENERAL:  Alert & oriented; pleasant & cooperative... HEENT:  [place]/AT,  EACs-clear, TMs-wnl, NOSE-clear, THROAT-clear & wnl. NECK:  Supple w/ fairROM; no JVD; normal carotid impulses w/o bruits; no thyromegaly or nodules palpated; no lymphadenopathy. CHEST:  Clear to P & A; without wheezes/ rales/ or rhonchi. HEART:  Regular Rhythm; without murmurs/ rubs/ or gallops. ABDOMEN:  Soft w/   normal bowel sounds; no organomegaly or masses detected.no guarding or rebound, neg murphys sign  EXT: without deformities or arthritic changes; no varicose veins/ venous insuffic/ or edema. NEURO:   gait normal & balance OK. DERM:  Clear     Assessment:

## 2013-03-08 LAB — URINE CULTURE
Colony Count: NO GROWTH
Organism ID, Bacteria: NO GROWTH

## 2013-03-13 ENCOUNTER — Encounter: Payer: Self-pay | Admitting: *Deleted

## 2013-03-13 NOTE — Progress Notes (Signed)
Quick Note:  Left detailed message to inform pt that his results are being released to mychart and if he has any questions to contact the office. ______

## 2013-03-24 ENCOUNTER — Telehealth: Payer: Self-pay | Admitting: *Deleted

## 2013-03-24 MED ORDER — DICYCLOMINE HCL 20 MG PO TABS: 20.0000 mg | ORAL_TABLET | Freq: Three times a day (TID) | ORAL | Status: DC | PRN

## 2013-03-24 NOTE — Telephone Encounter (Signed)
Per SN: Call in bentyl 20 mg #50 x 1 refill 1 po TID prn abd cramping  rx sent. lmtcb x1

## 2013-03-24 NOTE — Telephone Encounter (Signed)
Spoke with pts mother and she stated that the pt has some nausea meds but he is needing the cramping meds called in for the abd cramping.  She stated that this has been going on since he saw Shanda BumpsJessica with GI.   His mother is requesting that something be called in to help with the abd cramping.  She was also given the primary care number downstairs to call and set up appt for her son.  SN please advise. Thanks  No Known Allergies   Current Outpatient Prescriptions on File Prior to Visit  Medication Sig Dispense Refill  . albuterol (PROVENTIL HFA;VENTOLIN HFA) 108 (90 BASE) MCG/ACT inhaler Inhale 2 puffs into the lungs every 6 (six) hours as needed for wheezing.  1 Inhaler  11  . Calcium-Magnesium-Zinc 500-250-12.5 MG TABS Take by mouth 3 (three) times daily.        . cetirizine (ZYRTEC ALLERGY) 10 MG tablet Take 10 mg by mouth daily as needed.       . fluticasone (FLOVENT HFA) 110 MCG/ACT inhaler Inhale 2 puffs into the lungs 2 (two) times daily.  1 Inhaler  2  . guaiFENesin (MUCINEX) 600 MG 12 hr tablet Take 600 mg by mouth daily.        . metroNIDAZOLE (FLAGYL) 250 MG tablet Take 1 tablet (250 mg total) by mouth 3 (three) times daily.  30 tablet  0  . ondansetron (ZOFRAN ODT) 4 MG disintegrating tablet Take 1 tablet (4 mg total) by mouth every 8 (eight) hours as needed for nausea or vomiting.  30 tablet  0  . pantoprazole (PROTONIX) 40 MG tablet Take 1 tablet (40 mg total) by mouth 2 (two) times daily.  60 tablet  11  . promethazine (PHENERGAN) 12.5 MG tablet Take 1 tablet (12.5 mg total) by mouth every 6 (six) hours as needed for nausea or vomiting.  20 tablet  0  . Testosterone 20.25 MG/1.25GM (1.62%) GEL Place 2 Squirts onto the skin daily.  1.25 g  0  . venlafaxine XR (EFFEXOR-XR) 75 MG 24 hr capsule Take 75 mg by mouth 2 (two) times daily.       No current facility-administered medications on file prior to visit.

## 2013-04-01 ENCOUNTER — Ambulatory Visit (INDEPENDENT_AMBULATORY_CARE_PROVIDER_SITE_OTHER): Payer: BC Managed Care – PPO | Admitting: Pulmonary Disease

## 2013-04-01 VITALS — BP 104/60 | HR 110 | Temp 97.9°F | Ht 71.0 in | Wt 265.4 lb

## 2013-04-01 DIAGNOSIS — F329 Major depressive disorder, single episode, unspecified: Secondary | ICD-10-CM

## 2013-04-01 DIAGNOSIS — J45909 Unspecified asthma, uncomplicated: Secondary | ICD-10-CM

## 2013-04-01 DIAGNOSIS — R110 Nausea: Secondary | ICD-10-CM

## 2013-04-01 DIAGNOSIS — E291 Testicular hypofunction: Secondary | ICD-10-CM

## 2013-04-01 DIAGNOSIS — E785 Hyperlipidemia, unspecified: Secondary | ICD-10-CM

## 2013-04-01 DIAGNOSIS — E663 Overweight: Secondary | ICD-10-CM

## 2013-04-01 DIAGNOSIS — K3184 Gastroparesis: Secondary | ICD-10-CM

## 2013-04-01 DIAGNOSIS — J683 Other acute and subacute respiratory conditions due to chemicals, gases, fumes and vapors: Secondary | ICD-10-CM

## 2013-04-01 DIAGNOSIS — R109 Unspecified abdominal pain: Secondary | ICD-10-CM

## 2013-04-01 DIAGNOSIS — F32A Depression, unspecified: Secondary | ICD-10-CM

## 2013-04-01 DIAGNOSIS — K589 Irritable bowel syndrome without diarrhea: Secondary | ICD-10-CM | POA: Insufficient documentation

## 2013-04-01 DIAGNOSIS — F3289 Other specified depressive episodes: Secondary | ICD-10-CM

## 2013-04-01 MED ORDER — METOCLOPRAMIDE HCL 5 MG PO TABS: 5.0000 mg | ORAL_TABLET | Freq: Three times a day (TID) | ORAL | Status: DC

## 2013-04-01 NOTE — Patient Instructions (Signed)
Today we updated your med list in our EPIC system...    Continue your current medications the same...  We decided to try METACLOPRAMIDE 5mg  Three times daily for the Gastroparesis and nausea...  Continue the BENTYL after the 1st meal of the day & as needed after lunch 7 dinner for the abd cramping pain...  We will arrange for further GI opinion from your Gastroenterologist- drKaplan...  Finally I suggest that we get a specialist consultation regarding your Depression since it hasn't responded to an SSRI or SNRI medication...  Call for any questions or if we can be of service in any way.[name]..[name]

## 2013-04-01 NOTE — Progress Notes (Signed)
Subjective:     Patient ID: Anthony Russell, male   DOB: Jan 15, 1981, 33 y.o.   MRN: [id number]  HPI 33 y/o WM here for a follow up visit...   ~  Jan10:  I last saw him 12/07 for an add-on visit due to sore throat... he has hx of Overweight and Hyperlipidemia- on diet Rx alone... he saw TParrett,NP recently w/ atypical CP, reflux symptoms, abd gas complaints- he has CXR= clear, WNL;  EKG= NSR, WNL;  Labs= normal;  and was seen in [facility] w/ AbdSonar said to be normal as well... he was treated w/ Protonix, Pepcid, anti-gas meds w/ some improvement... appt sched w/ GI- DrKaplan next week.  ~  July 05, 2011:  3 year ROV & Anthony Russell is here for check up & refill of meds;  He tells me he hurt his back at work [employer]) & was fired, he sued the [employer] for [employer] won that & a lump sum; he has prev hx LumbarLam 2007 by [employer] & needs f/u eval (he will set this up on his own)... He does not have insurance at present & requests NO CXR, EKG, Lab work...    His breathing has been good on Flovent Bid & Proair as needed, he denies URI or exac;  He takes Protonix for reflux & symptoms are controlled;  Also on Zoloft50 but just uses it as needed for depression...  We reviewed prob list, meds, xrays and labs> see below>>  ~  October 15, 2012:  71mo ROV & CPX> Anthony Russell notes that he is at [employer] now [employer]) & having to walk a lot w/ some SOB noted, heart racing, bilat leg pain (now w/ new insoles for shoes); he notes some days it's hard to get OOB even w/ the Zoloft; We reviewed the following medical problems during today's office visit >>     AR/ RADS> on Zyrtek10, FloventHFA-2spBid, AlbutHFA prn;  Notes worse SOB in hot/ humid weather- try Singulair10mg /d...    Hx AtypCP> he notes SOB w/ activ but no CP; he is out-of-shape w/o exercise proir to walking at school; advised gym work outs & regular physical activity...    Hyperlipid> on diet alone; Labs 9/14 showed TChol 172, TG 270, HDL 28, LDL 166... He  declines med rx, therefore needs diet/ exerc/ wt reduction...    Overweight> weight = 260#,  71" tall,  BMI=36;  He says he was down to 230#;  We reviewed diet, exercise, wt reduction strategies...    GERD> on Protonix40;  He notes incr reflux symptoms and is rec to increase the Protonix to 40mg Bid + vigorous antireflux regimen......    Hypogonadism> not on hormone therapy;  Notes energy poor, hard to get up & go, no drive etc;  Testos level 4/09 = 201, start Androgel 2pumps w/ f/u lab if he'll stick w/ it.    LBP> s/p LLam 2007, no recent problem & we reviewed exercises to help back & strengthen abs...    Anxiety/ Depression> on Zoloft50;  He notes continued symptoms, offered Psyche referral, he prefers to incr Zoloft 100mg /d... We reviewed prob list, meds, xrays and labs> see below for updates >> he refused the 2014 Flu vaccine...  CXR 9/14 showed norm heart size, clear lungs, wnl/NAD.Marland KitchenMarland Kitchen  EKG 9/14 showed NSR, rate 84, wnl/NAD...  LABS 9/14:  FLP- parameters are off on diet alone;  Chems- ok x BS=111;  CBC- wnl;  TSH=1.11;  VitD=25:  Testos=201...  ~  April 01, 2013:  59mo ROV & Anthony Russell is c/o 18mo hx abd discomfort/ cramping, nausea worse after eating, no vomiting, occas loose stolls, no blood seen; remainder of ROS is essent neg x incr depression symptoms, hard to get OOB in AM, & no response to Effexor/ Zoloft => we will refer to Psyche, DrPlovsky...  We reviewed the following medical problems during today's office visit >>     AR/ RADS> on Zyrtek10, FloventHFA-2spBid, AlbutHFA prn, Mucinex prn;  Notes worse SOB in hot/ humid weather, not exercising regularly, wt=265# & BMI=37...    Hx AtypCP> he notes SOB w/ activ but no CP; he is out-of-shape w/o exercise proir to walking at school; advised gym work outs & regular physical activity, get wt down!    Hyperlipid> on diet alone; Labs 9/14 showed TChol 172, TG 270, HDL 28, LDL 166... He declines med rx, therefore needs diet/ exerc/ wt reduction...     Overweight> weight = 265#, 71" tall,  BMI=36-7;  He says he was down to 230#;  We reviewed diet, exercise, wt reduction strategies...    GERD> on Zantac150Bid & Bentyl20Tid,  off Protonix40;  He notes incr reflux symptoms and is rec to increase the vigorous antireflux regimen & f/u w/ GI...    Hypogonadism> on Androgel1.62%- 2 pumps daily; Testos level 9/14 = 201 assoc w/ fatique, no energy, feeling weak, etc; started topical Rx and ?sl better?     LBP> s/p LLam 2007, no recent problem & we reviewed exercises to help back & strengthen abs...    Anxiety/ Depression> offEffexor75Bid & Zoloft50;  He notes continued symptoms, offered Psyche referral, DrPlovsky... We reviewed prob list, meds, xrays and labs> see below for updates >>   LABS 2/15:  Chems- ok x BS=115 ALT=67;  CBC- wnl;  Sed=14...    Problems List:    ALLERGY (ICD-995.3) - Rx w/ OTC antihistamines Prn.  Hx of UPPER RESPIRATORY INFECTION - he is an active smoker... Prev URIs treated w/ ZPak in past. ASTHMATIC BRONCHITIS >> on FLOVENT 110- 2 puffs Bid, PROAIR HFA 1-2 sp as needed... ~  CXR 12/09 showed normal heart size, clear lungs, NAD... ~  9/14: on Zyrtek10, FloventHFA-2spBid, AlbutHFA prn;  Notes worse SOB in hot/ humid weather- try Singulair10mg /d. ~  CXR 9/14 showed norm heart size, clear lungs, wnl/NAD...  CHEST PAIN, ATYPICAL (ICD-786.59) - prev neg eval w/ normal CXR/ EKG...  HYPERLIPIDEMIA (ICD-272.4) - on diet alone... ~  FLP 9/07 showed TChol 154, TG 183, HDL 23, LDL 95... rec- diet + exercise, may need Fibrate. ~  9/14: on diet alone; Labs 9/14 showed TChol 172, TG 270, HDL 28, LDL 166... He declines med rx, therefore needs diet/ exerc/ wt reduction.  OVERWEIGHT (ICD-278.02) - we discussed diet & exercise program... ~  9/07:  Weight = 225# ~  1/10:  Weight = 246# ~  6/13:  Weight = 258# ~  9/14:  Weight = 260#  GERD (ICD-530.81) & DYSPEPSIA (ICD-536.8) - on PROTONIX 40mg /d... ~  UGI series 7/04 showed GE reflux  w/o HH seen... ~  AbdSonar 4/09 was normal... ~  Jan10:  Protonix incr to Bid and referred to GI- DrKaplan... ~  EGD 3/10 by DrKaplan was WNL.[name]. ~  Gastric Emptying Scan 3/10 showed 73% retention at 1H & 63% at 2H (norm <30% at [facility]). ~  9/14: on Protonix40;  He notes incr reflux symptoms and is rec to increase the Protonix to 40mg Bid + vigorous antireflux regimen  HYPOGONADISM >>  ~  Labs 4/10 showed Testos level = 233, rec to take Topical Testos replacement Rx but he declined... ~  Labs 9/14 showed Testos = 201 and Rx for ANDROGEL 1.62%- 2 pumps daily...  Hx of BACK PAIN, LUMBAR (ICD-724.2) - s/p lumbar laminectomy 2007 by DrCabbell.  ANXIETY & DEPRESSION >> on ZOLOFT 50mg  but he prefers to take it just as needed when depressed. ~  9/14: on Zoloft50;  He notes continued symptoms, offered Psyche referral, he prefers to incr Zoloft 100mg /d...  DERM >>  ~  1/14:  He had an irritated verruca removed by PA at DrLupton's office...  HEALTH MAINTENANCE >> ~  GI:  Hx GERD & rec to take Protonix40Bid + vigorous antireflux regimen... ~  GU:  Hx Hypogonadism & rec to take Androgel 1.62%- 2 pumps daily w/ f/u labs... ~  Immuniz:  He refuses the Flu vaccines;  He received the TDAP 7/14;  We reviewed the current rec indications for Pneumovax & Shingles vax...   Past Surgical History  Procedure Laterality Date  . Lumbar laminectomy  2007    DrCabbell    Outpatient Encounter Prescriptions as of 04/01/2013  Medication Sig  . albuterol (PROVENTIL HFA;VENTOLIN HFA) 108 (90 BASE) MCG/ACT inhaler Inhale 2 puffs into the lungs every 6 (six) hours as needed for wheezing.  . Calcium-Magnesium-Zinc 500-250-12.5 MG TABS Take by mouth 3 (three) times daily.    . Chlorphen-PE-Acetaminophen (NOREL AD PO) Take by mouth. Take once daily  . dicyclomine (BENTYL) 20 MG tablet Take 1 tablet (20 mg total) by mouth 3 (three) times daily as needed for spasms.  . fluticasone (FLOVENT HFA) 110 MCG/ACT inhaler Inhale 2  puffs into the lungs 2 (two) times daily.  . metroNIDAZOLE (FLAGYL) 250 MG tablet Take 1 tablet (250 mg total) by mouth 3 (three) times daily.  . promethazine (PHENERGAN) 12.5 MG tablet Take 1 tablet (12.5 mg total) by mouth every 6 (six) hours as needed for nausea or vomiting.  . ranitidine (ZANTAC) 150 MG tablet Take 150 mg by mouth 2 (two) times daily.  . Testosterone 20.25 MG/1.25GM (1.62%) GEL Place 2 Squirts onto the skin daily.  [name] venlafaxine XR (EFFEXOR-XR) 75 MG 24 hr capsule Take 75 mg by mouth 2 (two) times daily.  . cetirizine (ZYRTEC ALLERGY) 10 MG tablet Take 10 mg by mouth daily as needed.   [name] guaiFENesin (MUCINEX) 600 MG 12 hr tablet Take 600 mg by mouth daily.    . ondansetron (ZOFRAN ODT) 4 MG disintegrating tablet Take 1 tablet (4 mg total) by mouth every 8 (eight) hours as needed for nausea or vomiting.  . [DISCONTINUED] pantoprazole (PROTONIX) 40 MG tablet Take 1 tablet (40 mg total) by mouth 2 (two) times daily.    No Known Allergies   Current Medications, Allergies, Past Medical History, Past Surgical History, Family History, and Social History were reviewed in [employer] record.   Review of Systems        The patient complains of severe indigestion/heartburn.  The patient denies anorexia, fever, weight loss, weight gain, vision loss, decreased hearing, hoarseness, chest pain, syncope, dyspnea on exertion, peripheral edema, prolonged cough, headaches, hemoptysis, abdominal pain, melena, hematochezia, hematuria, incontinence, muscle weakness, suspicious skin lesions, transient blindness, difficulty walking, depression, unusual weight change, abnormal bleeding, enlarged lymph nodes, and angioedema >> all other systems neg except as noted...    Objective:   Physical Exam    WD, Overweight, 33 y/o WM in NAD... flat affect... GENERAL:  Alert &  oriented; pleasant & cooperative... HEENT:  [place]/AT, EOM-wnl, PERRLA, EACs-clear, TMs-wnl, NOSE-clear,  THROAT-clear & wnl. NECK:  Supple w/ fairROM; no JVD; normal carotid impulses w/o bruits; no thyromegaly or nodules palpated; no lymphadenopathy. CHEST:  Clear to P & A; without wheezes/ rales/ or rhonchi. HEART:  Regular Rhythm; without murmurs/ rubs/ or gallops. ABDOMEN:  Soft w/ min epig tender on palp; normal bowel sounds; no organomegaly or masses detected. EXT: without deformities or arthritic changes; no varicose veins/ venous insuffic/ or edema. NEURO:  CN's intact; motor testing normal; sensory testing normal; gait normal & balance OK. DERM:  No lesions noted; no rash etc...  RADIOLOGY DATA:  Reviewed in the EPIC EMR & discussed w/ the patient...  LABORATORY DATA:  Reviewed in the EPIC EMR & discussed w/ the patient...   Assessment:    Hx AR/ AB>  Stable on Flovent 100 and Proair HFA, add trial Singulair in the hot weather...  Hx Atyp CP>  Neg eval 6/12 w/ normal CXR, EKG...  Hyperlipid>  numbers not at goals on diet alone, must lose wt or start meds...  Overweight>  We reviewed diet & exercise needed...  GERD>  He had gastroparesis on emptying scan & remains on Protonix & antireflux regimen...  Hx LBP>  He is s/p LLam 2007 by DrCabbell...  Anxiety/ Depression>  Neither Zoloft or effexor were helpful- rec refer to DrPlovsky...     Plan:     Patient's Medications  New Prescriptions   METOCLOPRAMIDE (REGLAN) 5 MG TABLET    Take 1 tablet (5 mg total) by mouth 3 (three) times daily before meals.  Previous Medications   ALBUTEROL (PROVENTIL HFA;VENTOLIN HFA) 108 (90 BASE) MCG/ACT INHALER    Inhale 2 puffs into the lungs every 6 (six) hours as needed for wheezing.   CALCIUM-MAGNESIUM-ZINC 500-250-12.5 MG TABS    Take by mouth 3 (three) times daily.     CETIRIZINE (ZYRTEC ALLERGY) 10 MG TABLET    Take 10 mg by mouth daily as needed.    CHLORPHEN-PE-ACETAMINOPHEN (NOREL AD PO)    Take by mouth. Take once daily   DICYCLOMINE (BENTYL) 20 MG TABLET    Take 1 tablet (20 mg  total) by mouth 3 (three) times daily as needed for spasms.   FLUTICASONE (FLOVENT HFA) 110 MCG/ACT INHALER    Inhale 2 puffs into the lungs 2 (two) times daily.   GUAIFENESIN (MUCINEX) 600 MG 12 HR TABLET    Take 600 mg by mouth daily.     METRONIDAZOLE (FLAGYL) 250 MG TABLET    Take 1 tablet (250 mg total) by mouth 3 (three) times daily.   ONDANSETRON (ZOFRAN ODT) 4 MG DISINTEGRATING TABLET    Take 1 tablet (4 mg total) by mouth every 8 (eight) hours as needed for nausea or vomiting.   PROMETHAZINE (PHENERGAN) 12.5 MG TABLET    Take 1 tablet (12.5 mg total) by mouth every 6 (six) hours as needed for nausea or vomiting.   RANITIDINE (ZANTAC) 150 MG TABLET    Take 150 mg by mouth 2 (two) times daily.   TESTOSTERONE 20.25 MG/1.25GM (1.62%) GEL    Place 2 Squirts onto the skin daily.   VENLAFAXINE XR (EFFEXOR-XR) 75 MG 24 HR CAPSULE    Take 75 mg by mouth 2 (two) times daily.  Modified Medications   No medications on file  Discontinued Medications   PANTOPRAZOLE (PROTONIX) 40 MG TABLET    Take 1 tablet (40 mg total) by mouth 2 (two) times  daily.

## 2013-05-12 ENCOUNTER — Ambulatory Visit: Payer: BC Managed Care – PPO | Admitting: Gastroenterology

## 2013-06-25 ENCOUNTER — Ambulatory Visit: Payer: BC Managed Care – PPO | Admitting: Internal Medicine

## 2013-09-27 ENCOUNTER — Encounter: Payer: Self-pay | Admitting: Gastroenterology

## 2014-02-26 ENCOUNTER — Ambulatory Visit: Payer: Self-pay | Admitting: Medical

## 2014-03-30 ENCOUNTER — Telehealth: Payer: Self-pay | Admitting: Pulmonary Disease

## 2014-03-30 NOTE — Telephone Encounter (Signed)
PCP was changed 04/2013 from dr nadel to dr Drue Novelpaz- patient has never seen dr Drue Novelpaz- changed back to pcp

## 2014-04-15 ENCOUNTER — Ambulatory Visit (INDEPENDENT_AMBULATORY_CARE_PROVIDER_SITE_OTHER): Payer: BLUE CROSS/BLUE SHIELD | Admitting: Medical

## 2014-04-15 ENCOUNTER — Encounter: Payer: Self-pay | Admitting: Medical

## 2014-04-15 VITALS — BP 146/88 | HR 118 | Temp 98.4°F | Ht 70.0 in | Wt 259.8 lb

## 2014-04-15 DIAGNOSIS — J4520 Mild intermittent asthma, uncomplicated: Secondary | ICD-10-CM

## 2014-04-15 DIAGNOSIS — K219 Gastro-esophageal reflux disease without esophagitis: Secondary | ICD-10-CM

## 2014-04-15 DIAGNOSIS — J309 Allergic rhinitis, unspecified: Secondary | ICD-10-CM | POA: Insufficient documentation

## 2014-04-15 DIAGNOSIS — K589 Irritable bowel syndrome without diarrhea: Secondary | ICD-10-CM

## 2014-04-15 DIAGNOSIS — J3089 Other allergic rhinitis: Secondary | ICD-10-CM

## 2014-04-15 DIAGNOSIS — J029 Acute pharyngitis, unspecified: Secondary | ICD-10-CM | POA: Insufficient documentation

## 2014-04-15 DIAGNOSIS — J45909 Unspecified asthma, uncomplicated: Secondary | ICD-10-CM | POA: Insufficient documentation

## 2014-04-15 LAB — POCT RAPID STREP A (OFFICE): RAPID STREP A SCREEN: POSITIVE — AB

## 2014-04-15 MED ORDER — AZITHROMYCIN 250 MG PO TABS: ORAL_TABLET | ORAL | Status: DC

## 2014-04-15 NOTE — Assessment & Plan Note (Signed)
  Asthma- He states has been controlled for about 1 yr. NO use of inhalers for one yr. He states when had problems it was mild.

## 2014-04-15 NOTE — Progress Notes (Signed)
Pre visit review using our clinic review tool, if applicable. No additional management support is needed unless otherwise documented below in the visit note. 

## 2014-04-15 NOTE — Patient Instructions (Signed)
Acute pharyngitis By bright red appearance with pain and prevalence in community will treat for strep. Rapid test done today as well. But again treating clinically  Rx azithryomycin.   Allergic rhinitis Allergies- yr round. Worse in summer. Controlled presently. Zyrtec.  Recent possible flare. Consider otc flonase if feeling pnd or worse allergies.   Asthma in adult  Asthma- He states has been controlled for about 1 yr. NO use of inhalers for one yr. He states when had problems it was mild.   GERD (gastroesophageal reflux disease) Genella RifeGerd- He states controlled with zantac bid.    Follow up in 7 days persisting symptoms or as needed.  Also could schedule for wellness exam in near future.

## 2014-04-15 NOTE — Assessment & Plan Note (Signed)
Allergies- yr round. Worse in summer. Controlled presently. Zyrtec.  Recent possible flare. Consider otc flonase if feeling pnd or worse allergies.

## 2014-04-15 NOTE — Assessment & Plan Note (Signed)
By bright red appearance with pain and prevalence in community will treat for strep. Rapid test done today as well. But again treating clinically  Rx azithryomycin.

## 2014-04-15 NOTE — Progress Notes (Signed)
Subjective:    Patient ID: Anthony Russell, male    DOB: 06/05/1980, 34 y.o.   MRN: [id number]  HPI   I have reviewed pt PMH, PSH, FH, Social History and Surgical History  Allergies- yr round. Worse in summer. Controlled presently. Zyrtec.  Anxiety with depression- Pt takes effexor. Pt states he supposed to see psychiatrist. But he never did make that appointment. Dr. Lennon AlstromNadal told him to see psychiatrist. Pt states mood stable. No suicidal or homicidal ideations.    Pt has ibs. He takes bentyl in the past when symptoms were bad. But controlled now.  Genella RifeGerd- He states controlled with zantac bid.  Pt was prescribed   Testosterone but insurance company never approved it.  Pt is Systems analystsoftware developer, No exercise, Drinks caffeine about 4-5 cups a day. Single.   Pt today states when he woke up felt some mild st/tight sensation. Then eased up on way to work. Then later in afternoon. ST again with faint swimmy headed sensation and mild ha. No bodyaches. Uncomfortable sensation swallowing. No sob, no wheezing. No rash. Pt has had some congestion. Faint sinus pressure.     Review of Systems  Constitutional: Negative for fever, chills and fatigue.  HENT: Positive for congestion and sore throat.   Respiratory: Negative for cough, shortness of breath and wheezing.   Cardiovascular: Negative for chest pain and palpitations.  Gastrointestinal: Negative for abdominal pain.  Musculoskeletal: Negative for back pain.  Neurological: Negative for dizziness, syncope, speech difficulty, weakness, numbness and headaches.  Hematological: Negative for adenopathy. Does not bruise/bleed easily.  Psychiatric/Behavioral: Negative for behavioral problems, confusion and dysphoric mood.   Past Medical History  Diagnosis Date  . Allergy, unspecified not elsewhere classified     rx w/ OTC antihistamines PRN  . Atypical chest pain     recent neg eval w/ normal cxr/ekg  . Hyperlipidemia     on diet alone  .  Overweight(278.02)     weight Jan10=246#.[name]..he was 225# in 9/07...diet and exercise was discussed  . GERD (gastroesophageal reflux disease)     on protonix 40mg /d  . Dyspepsia     on protonix 40mg /d  . Lumbar back pain     s/p lumbar laminectomy 2007 by DrCabbell  . Allergy   . Anxiety   . Asthma   . Depression     History   Social History  . Marital Status: Single    Spouse Name: N/A  . Number of Children: 0  . Years of Education: N/A   Occupational History  . Not on file.   Social History Main Topics  . Smoking status: Former Smoker -- 0.10 packs/day for 8 years    Types: Cigarettes    Quit date: 03/03/2011  . Smokeless tobacco: Never Used  . Alcohol Use: Yes     Comment: rare  . Drug Use: No  . Sexual Activity: Not on file   Other Topics Concern  . Not on file   Social History Narrative   3 cups caffeine a day    Past Surgical History  Procedure Laterality Date  . Lumbar laminectomy  2007    DrCabbell  . Tear duct probing      Family History  Problem Relation Age of Onset  . Allergies Mother   . Irritable bowel syndrome Mother   . Allergies Father   . Allergies Brother   . Colon cancer Neg Hx   . Irritable bowel syndrome Brother     No Known  Allergies  Current Outpatient Prescriptions on File Prior to Visit  Medication Sig Dispense Refill  . albuterol (PROVENTIL HFA;VENTOLIN HFA) 108 (90 BASE) MCG/ACT inhaler Inhale 2 puffs into the lungs every 6 (six) hours as needed for wheezing. 1 Inhaler 11  . Calcium-Magnesium-Zinc 500-250-12.5 MG TABS Take by mouth 3 (three) times daily.      . cetirizine (ZYRTEC ALLERGY) 10 MG tablet Take 10 mg by mouth daily as needed.     . dicyclomine (BENTYL) 20 MG tablet Take 1 tablet (20 mg total) by mouth 3 (three) times daily as needed for spasms. 50 tablet 1  . fluticasone (FLOVENT HFA) 110 MCG/ACT inhaler Inhale 2 puffs into the lungs 2 (two) times daily. 1 Inhaler 2  . guaiFENesin (MUCINEX) 600 MG 12 hr tablet  Take 600 mg by mouth daily.      [name] venlafaxine XR (EFFEXOR-XR) 75 MG 24 hr capsule Take 75 mg by mouth 2 (two) times daily.     No current facility-administered medications on file prior to visit.    BP 146/88 mmHg  Pulse 118  Temp(Src) 98.4 F (36.9 C) (Oral)  Ht  (1.778 m)  Wt 259 lb 12.8 oz (117.845 kg)  BMI 37.28 kg/m2  SpO2 99%      Objective:   Physical Exam   General  Mental Status - Alert. General Appearance - Well groomed. Not in acute distress.  Skin Rashes- No Rashes.  HEENT Head- Normal. Ear Auditory Canal - Left- Normal. Right - Normal.Tympanic Membrane- Left- Normal. Right- Normal. Eye Sclera/Conjunctiva- Left- Normal. Right- Normal. Nose & Sinuses Nasal Mucosa- Left-  Boggy and Congested. Right-  Boggy and  Congested. No Bilateral maxillary But some frontal sinus pressure. Mouth & Throat Lips: Upper Lip- Normal: no dryness, cracking, pallor, cyanosis, or vesicular eruption. Lower Lip-Normal: no dryness, cracking, pallor, cyanosis or vesicular eruption. Buccal Mucosa- Bilateral- No Aphthous ulcers. Oropharynx- No Discharge or Erythema. +pnd Tonsils: Characteristics- Bilateral- Moderte bright  Erythema +  Congestion. Size/Enlargement- Bilateral- Mild enlargement. Discharge- bilateral-None.  Neck Neck- Supple. No Masses.   Chest and Lung Exam Auscultation: Breath Sounds:-Clear even and unlabored.  Cardiovascular Auscultation:Rythm- Regular, rate and rhythm. Murmurs & Other Heart Sounds:Ausculatation of the heart reveal- No Murmurs.  Lymphatic Head & Neck General Head & Neck Lymphatics: Bilateral: Description- No Localized lymphadenopathy.      Assessment & Plan:

## 2014-04-15 NOTE — Assessment & Plan Note (Signed)
Anthony Russell- He states controlled with zantac bid.

## 2014-04-15 NOTE — Assessment & Plan Note (Signed)
Pt has ibs. He takes bentyl in the past when symptoms were bad. But controlled now.

## 2014-04-17 ENCOUNTER — Telehealth: Payer: Self-pay | Admitting: Medical

## 2014-04-17 NOTE — Telephone Encounter (Signed)
Caller name: Fletcher Anonotts, Dellas L Relation to pt: self  Call back number: [phone] [id number] - [place], [place] - [address] [facility] [address] [facility] [address] [phone] (Phone) [phone] (Fax        Reason for call:  Pt requesting doctor letter extension to Monday 04/20/14 returning back to work pt also requesting a nasal steroid. Please advise

## 2014-04-18 MED ORDER — MOMETASONE FUROATE 50 MCG/ACT NA SUSP: 2.0000 | Freq: Every day | NASAL | Status: DC

## 2014-04-18 NOTE — Telephone Encounter (Signed)
Message to lpn. And rx of nasonex.

## 2014-04-20 ENCOUNTER — Telehealth: Payer: Self-pay | Admitting: Medical

## 2014-04-20 NOTE — Telephone Encounter (Signed)
Letter printed for signature

## 2014-04-20 NOTE — Telephone Encounter (Signed)
PATIENT CALLED Friday NEES TO PICK UP HIS NOTE FOR WORK DID NOT HEAR FROM ANYONE

## 2014-04-20 NOTE — Telephone Encounter (Signed)
Pt called this am for work not. I got his message on Friday.

## 2014-09-10 ENCOUNTER — Telehealth: Payer: Self-pay | Admitting: Medical

## 2014-09-10 NOTE — Telephone Encounter (Signed)
Caller name: [place] Relationship to patient:self Can be reached:[phone] Pharmacy:[facility] [address] [place]   Reason for call:Was on protonix and is now on zantac and it is not helpins as much.  Would like to go back on the protonix.  Please send a refill for the effexor

## 2014-09-11 MED ORDER — PANTOPRAZOLE SODIUM 40 MG PO TBEC: 40.0000 mg | DELAYED_RELEASE_TABLET | Freq: Every day | ORAL | Status: DC

## 2014-09-11 NOTE — Telephone Encounter (Signed)
Answering for Ramon Dredge who is out of office. I will send in 30-day supply of Protonix in to his pharmacy. He will need to follow-up with Ramon Dredge regarding acid reflux. In regards to the Effexor, I have it listed as a historical medication meaning I do not see where Ramon Dredge has ever filled this medication and there are no notes where he has commented on it. I will defer fills to [facility].

## 2014-09-11 NOTE — Telephone Encounter (Signed)
Called the patient and advised Protonix has been sent to his pharmacy.  Told the patient we could not fill the Effexor at this time and I would route the message to [facility] for him to address on his return

## 2014-09-14 NOTE — Telephone Encounter (Signed)
Appointment scheduled for Friday. 

## 2014-09-14 NOTE — Telephone Encounter (Signed)
Pt needs to come in. He was on effexor. Dr. Lennon Alstrom had advised to see psychiatrist in past. I last saw him in march. So won't rx med for mood without seeing him first. Please notify pt.

## 2014-09-18 ENCOUNTER — Encounter: Payer: Self-pay | Admitting: Medical

## 2014-09-18 ENCOUNTER — Ambulatory Visit (INDEPENDENT_AMBULATORY_CARE_PROVIDER_SITE_OTHER): Payer: BLUE CROSS/BLUE SHIELD | Admitting: Medical

## 2014-09-18 VITALS — BP 106/76 | HR 109 | Temp 98.6°F | Ht 70.0 in | Wt 259.4 lb

## 2014-09-18 DIAGNOSIS — F329 Major depressive disorder, single episode, unspecified: Secondary | ICD-10-CM

## 2014-09-18 DIAGNOSIS — F419 Anxiety disorder, unspecified: Secondary | ICD-10-CM

## 2014-09-18 DIAGNOSIS — F32A Depression, unspecified: Secondary | ICD-10-CM

## 2014-09-18 MED ORDER — VENLAFAXINE HCL ER 75 MG PO CP24: 75.0000 mg | ORAL_CAPSULE | Freq: Every day | ORAL | Status: DC

## 2014-09-18 NOTE — Progress Notes (Signed)
Subjective:    Patient ID: Anthony Russell, male    DOB: Dec 13, 1980, 33 y.o.   MRN: [id number]  HPI   Pt in for follow up.  Pt has history of depression and anxiety. Pt had been on effexor for about a year. He has tried various other type meds in the past. He states effexor seemed to be the better of all the meds he has used. Pt states Dr. Lennon Alstrom had prescribed him effexor 75 mg and this was the max dose. It did help him with both anxiety and depression but not fully. Pt thought that with new job actually doing what he wanted to do would help with depression. But still some days moderate depression. Pt ran out the effexor in February. Now he states needs medication. No suicide or homicidal ideation. Describes lack of motivation. Excessive sleep. No appetite changes.   Pt never saw psychiatrist in past. He was considering going when Dr. Lennon Alstrom was his pcp. But then he lost insurance and never saw psychiatrist.   Review of Systems  Constitutional: Negative for fever, chills and fatigue.  Respiratory: Negative for cough, chest tightness, shortness of breath and wheezing.   Cardiovascular: Negative for chest pain and palpitations.  Musculoskeletal: Negative for back pain.  Neurological: Positive for numbness. Negative for dizziness and headaches.  Hematological: Negative for adenopathy. Does not bruise/bleed easily.  Psychiatric/Behavioral: Positive for dysphoric mood. Negative for suicidal ideas, behavioral problems and confusion. The patient is nervous/anxious.     Past Medical History  Diagnosis Date  . Allergy, unspecified not elsewhere classified     rx w/ OTC antihistamines PRN  . Atypical chest pain     recent neg eval w/ normal cxr/ekg  . Hyperlipidemia     on diet alone  . Overweight(278.02)     weight Jan10=246#.[name].he was 225# in 9/07...diet and exercise was discussed  . GERD (gastroesophageal reflux disease)     on protonix /d  . Dyspepsia     on protonix /d  .  Lumbar back pain     s/p lumbar laminectomy 2007 by DrCabbell  . Allergy   . Anxiety   . Asthma   . Depression     Social History   Social History  . Marital Status: Single    Spouse Name: N/A  . Number of Children: 0  . Years of Education: N/A   Occupational History  . Not on file.   Social History Main Topics  . Smoking status: Former Smoker -- 0.10 packs/day for 8 years    Types: Cigarettes    Quit date: 03/03/2011  . Smokeless tobacco: Never Used  . Alcohol Use: Yes     Comment: rare  . Drug Use: No  . Sexual Activity: Not on file   Other Topics Concern  . Not on file   Social History Narrative   3 cups caffeine a day    Past Surgical History  Procedure Laterality Date  . Lumbar laminectomy  2007    DrCabbell  . Tear duct probing      Family History  Problem Relation Age of Onset  . Allergies Mother   . Irritable bowel syndrome Mother   . Allergies Father   . Allergies Brother   . Colon cancer Neg Hx   . Irritable bowel syndrome Brother     No Known Allergies  Current Outpatient Prescriptions on File Prior to Visit  Medication Sig Dispense Refill  . Calcium-Magnesium-Zinc 500-250-12.5 MG TABS Take by  mouth 3 (three) times daily.      . cetirizine (ZYRTEC ALLERGY) 10 MG tablet Take 10 mg by mouth daily as needed.     . fluticasone (FLOVENT HFA) 110 MCG/ACT inhaler Inhale 2 puffs into the lungs 2 (two) times daily. 1 Inhaler 2  . guaiFENesin (MUCINEX) 600 MG 12 hr tablet Take 600 mg by mouth daily.      . mometasone (NASONEX) 50 MCG/ACT nasal spray Place 2 sprays into the nose daily. 17 g 12  . pantoprazole (PROTONIX) 40 MG tablet Take 1 tablet (40 mg total) by mouth daily. 30 tablet 1  . albuterol (PROVENTIL HFA;VENTOLIN HFA) 108 (90 BASE) MCG/ACT inhaler Inhale 2 puffs into the lungs every 6 (six) hours as needed for wheezing. 1 Inhaler 11  . dicyclomine (BENTYL) 20 MG tablet Take 1 tablet (20 mg total) by mouth 3 (three) times daily as needed for  spasms. (Patient not taking: Reported on 09/18/2014) 50 tablet 1  . venlafaxine XR (EFFEXOR-XR) 75 MG 24 hr capsule Take 75 mg by mouth 2 (two) times daily.     No current facility-administered medications on file prior to visit.    BP 106/76 mmHg  Pulse 109  Temp(Src) 98.6 F (37 C) (Oral)  Ht 5\' 10"  (1.778 m)  Wt 259 lb 6.4 oz (117.663 kg)  BMI 37.22 kg/m2  SpO2 98%       Objective:   Physical Exam  General Mental Status- Alert. General Appearance- Not in acute distress.   Skin General: Color- Normal Color. Moisture- Normal Moisture.  Neck Carotid Arteries- Normal color. Moisture- Normal Moisture. No carotid bruits. No JVD.  Chest and Lung Exam Auscultation: Breath Sounds:-Normal. CTA.  Cardiovascular Auscultation:Rythm- Regular, Rate, Rhythm. Murmurs & Other Heart Sounds:Auscultation of the heart reveals- No Murmurs.  Abdomen Inspection:-Inspeection Normal. Palpation/Percussion:Note:No mass. Palpation and Percussion of the abdomen reveal- Non Tender, Non Distended + BS, no rebound or guarding.  Neurologic Cranial Nerve exam:- CN III-XII intact(No nystagmus), symmetric smile. Strength:- 5/5 equal and symmetric strength both upper and lower extremities.      Assessment & Plan:

## 2014-09-18 NOTE — Assessment & Plan Note (Signed)
Rx effexor as well.

## 2014-09-18 NOTE — Progress Notes (Signed)
before

## 2014-09-18 NOTE — Patient Instructions (Signed)
Depression With anxiety. Rx effexor rx 75. Follow up in a month. May increase to 150 mg. If not improving then will recommend psychiatrist referral.  Anxiety Rx effexor as well.    Follow up in 1 month or as needed

## 2014-09-18 NOTE — Assessment & Plan Note (Signed)
With anxiety. Rx effexor rx 75. Follow up in a month. May increase to 150 mg. If not improving then will recommend psychiatrist referral.

## 2015-01-18 ENCOUNTER — Ambulatory Visit (INDEPENDENT_AMBULATORY_CARE_PROVIDER_SITE_OTHER): Payer: BLUE CROSS/BLUE SHIELD | Admitting: Physician Assistant

## 2015-01-18 ENCOUNTER — Encounter: Payer: Self-pay | Admitting: Physician Assistant

## 2015-01-18 VITALS — BP 110/68 | HR 121 | Temp 99.1°F | Ht 70.0 in | Wt 269.6 lb

## 2015-01-18 DIAGNOSIS — J00 Acute nasopharyngitis [common cold]: Secondary | ICD-10-CM

## 2015-01-18 DIAGNOSIS — J208 Acute bronchitis due to other specified organisms: Principal | ICD-10-CM

## 2015-01-18 DIAGNOSIS — B9689 Other specified bacterial agents as the cause of diseases classified elsewhere: Secondary | ICD-10-CM | POA: Insufficient documentation

## 2015-01-18 MED ORDER — AZITHROMYCIN 250 MG PO TABS: ORAL_TABLET | ORAL | Status: DC

## 2015-01-18 MED ORDER — HYDROCOD POLST-CPM POLST ER 10-8 MG/5ML PO SUER: 5.0000 mL | Freq: Two times a day (BID) | ORAL | Status: DC | PRN

## 2015-01-18 NOTE — Assessment & Plan Note (Signed)
Rx Azithromycin.  Increase fluids.  Rest.  Saline nasal spray.  Probiotic.  Mucinex as directed.  Humidifier in bedroom. Hycodan per orders.  Call or return to clinic if symptoms are not improving.

## 2015-01-18 NOTE — Patient Instructions (Signed)
Take antibiotic (Azithromycin) as directed.  Increase fluids.  Get plenty of rest. Use Mucinex for congestion. Use cough syrup as directed. Take a daily probiotic (I recommend Align or Culturelle, but even Activia Yogurt may be beneficial).  A humidifier placed in the bedroom may offer some relief for a dry, scratchy throat of nasal irritation.  Read information below on acute bronchitis. Please call or return to clinic if symptoms are not improving.  Acute Bronchitis Bronchitis is when the airways that extend from the windpipe into the lungs get red, puffy, and painful (inflamed). Bronchitis often causes thick spit (mucus) to develop. This leads to a cough. A cough is the most common symptom of bronchitis. In acute bronchitis, the condition usually begins suddenly and goes away over time (usually in 2 weeks). Smoking, allergies, and asthma can make bronchitis worse. Repeated episodes of bronchitis may cause more lung problems.  HOME CARE  Rest.  Drink enough fluids to keep your pee (urine) clear or pale yellow (unless you need to limit fluids as told by your doctor).  Only take over-the-counter or prescription medicines as told by your doctor.  Avoid smoking and secondhand smoke. These can make bronchitis worse. If you are a smoker, think about using nicotine gum or skin patches. Quitting smoking will help your lungs heal faster.  Reduce the chance of getting bronchitis again by:  Washing your hands often.  Avoiding people with cold symptoms.  Trying not to touch your hands to your mouth, nose, or eyes.  Follow up with your doctor as told.  GET HELP IF: Your symptoms do not improve after 1 week of treatment. Symptoms include:  Cough.  Fever.  Coughing up thick spit.  Body aches.  Chest congestion.  Chills.  Shortness of breath.  Sore throat.  GET HELP RIGHT AWAY IF:   You have an increased fever.  You have chills.  You have severe shortness of breath.  You have  bloody thick spit (sputum).  You throw up (vomit) often.  You lose too much body fluid (dehydration).  You have a severe headache.  You faint.  MAKE SURE YOU:   Understand these instructions.  Will watch your condition.  Will get help right away if you are not doing well or get worse. Document Released: 07/05/2007 Document Revised: 09/18/2012 Document Reviewed: 07/09/2012 Exit[employer] Patient Information 2015 [employer]. This information is not intended to replace advice given to you by your health care provider. Make sure you discuss any questions you have with your health care provider.  

## 2015-01-18 NOTE — Progress Notes (Signed)
Patient presents to clinic today c/o 1 week of productive cough with chest congestion. Denies fever, chills. Has history of asthma -- has been using Albuterol which he typically does not need. Denies wheezing or SOB but has noted chest tightness intermittently.  Past Medical History  Diagnosis Date  . Allergy, unspecified not elsewhere classified     rx w/ OTC antihistamines PRN  . Atypical chest pain     recent neg eval w/ normal cxr/ekg  . Hyperlipidemia     on diet alone  . Overweight(278.02)     weight Jan10=246#.[name].he was 225# in 9/07...diet and exercise was discussed  . GERD (gastroesophageal reflux disease)     on protonix /d  . Dyspepsia     on protonix /d  . Lumbar back pain     s/p lumbar laminectomy 2007 by DrCabbell  . Allergy   . Anxiety   . Asthma   . Depression     Current Outpatient Prescriptions on File Prior to Visit  Medication Sig Dispense Refill  . Calcium-Magnesium-Zinc 500-250-12.5 MG TABS Take by mouth 3 (three) times daily.      . cetirizine (ZYRTEC ALLERGY) 10 MG tablet Take 10 mg by mouth daily as needed.     . dicyclomine (BENTYL) 20 MG tablet Take 1 tablet (20 mg total) by mouth 3 (three) times daily as needed for spasms. 50 tablet 1  . fluticasone (FLOVENT HFA) 110 MCG/ACT inhaler Inhale 2 puffs into the lungs 2 (two) times daily. 1 Inhaler 2  . guaiFENesin (MUCINEX) 600 MG 12 hr tablet Take 600 mg by mouth daily.      . mometasone (NASONEX) 50 MCG/ACT nasal spray Place 2 sprays into the nose daily. 17 g 12  . pantoprazole (PROTONIX) 40 MG tablet Take 1 tablet (40 mg total) by mouth daily. 30 tablet 1  . venlafaxine XR (EFFEXOR XR) 75 MG 24 hr capsule Take 1 capsule (75 mg total) by mouth daily with breakfast. 30 capsule 0  . albuterol (PROVENTIL HFA;VENTOLIN HFA) 108 (90 BASE) MCG/ACT inhaler Inhale 2 puffs into the lungs every 6 (six) hours as needed for wheezing. 1 Inhaler 11   No current facility-administered medications on file prior  to visit.    No Known Allergies  Family History  Problem Relation Age of Onset  . Allergies Mother   . Irritable bowel syndrome Mother   . Allergies Father   . Allergies Brother   . Colon cancer Neg Hx   . Irritable bowel syndrome Brother     Social History   Social History  . Marital Status: Single    Spouse Name: N/A  . Number of Children: 0  . Years of Education: N/A   Social History Main Topics  . Smoking status: Former Smoker -- 0.10 packs/day for 8 years    Types: Cigarettes    Quit date: 03/03/2011  . Smokeless tobacco: Never Used  . Alcohol Use: Yes     Comment: rare  . Drug Use: No  . Sexual Activity: Not Asked   Other Topics Concern  . None   Social History Narrative   3 cups caffeine a day   Review of Systems - See HPI.  All other ROS are negative.  BP 110/68 mmHg  Pulse 121  Temp(Src) 99.1 F (37.3 C) (Oral)  Ht  (1.778 m)  Wt 269 lb 9.6 oz (122.29 kg)  BMI 38.68 kg/m2  SpO2 98%  Physical Exam  Constitutional: He is oriented to  person, place, and time and well-developed, well-nourished, and in no distress.  HENT:  Head: Normocephalic and atraumatic.  Right Ear: External ear normal.  Left Ear: External ear normal.  Nose: Nose normal.  Mouth/Throat: Oropharynx is clear and moist. No oropharyngeal exudate.  TM within normal limits bilaterally.  Eyes: Conjunctivae are normal.  Neck: Neck supple.  Cardiovascular: Normal rate, regular rhythm, normal heart sounds and intact distal pulses.   Pulmonary/Chest: Effort normal and breath sounds normal. No respiratory distress. He has no wheezes. He has no rales. He exhibits no tenderness.  Lymphadenopathy:    He has no cervical adenopathy.  Neurological: He is alert and oriented to person, place, and time.  Skin: Skin is warm and dry. No rash noted.  Psychiatric: Affect normal.  Vitals reviewed.   No results found for this or any previous visit (from the past 2160  hour(s)).  Assessment/Plan: Acute bacterial bronchitis Rx Azithromycin.  Increase fluids.  Rest.  Saline nasal spray.  Probiotic.  Mucinex as directed.  Humidifier in bedroom. Hycodan per orders.  Call or return to clinic if symptoms are not improving.

## 2015-01-18 NOTE — Progress Notes (Signed)
Pre visit review using our clinic review tool, if applicable. No additional management support is needed unless otherwise documented below in the visit note. 

## 2015-04-05 ENCOUNTER — Ambulatory Visit (HOSPITAL_BASED_OUTPATIENT_CLINIC_OR_DEPARTMENT_OTHER)
Admission: RE | Admit: 2015-04-05 | Discharge: 2015-04-05 | Disposition: A | Discharge status: Home / Self Care | Payer: BLUE CROSS/BLUE SHIELD | Source: Ambulatory Visit | Attending: Medical | Admitting: Medical

## 2015-04-05 ENCOUNTER — Telehealth: Payer: Self-pay | Admitting: Medical

## 2015-04-05 ENCOUNTER — Encounter: Payer: Self-pay | Admitting: Medical

## 2015-04-05 ENCOUNTER — Other Ambulatory Visit: Payer: Self-pay | Admitting: Medical

## 2015-04-05 ENCOUNTER — Ambulatory Visit (INDEPENDENT_AMBULATORY_CARE_PROVIDER_SITE_OTHER): Payer: BLUE CROSS/BLUE SHIELD | Admitting: Medical

## 2015-04-05 VITALS — BP 110/76 | HR 91 | Temp 98.2°F | Ht 70.0 in | Wt 268.6 lb

## 2015-04-05 DIAGNOSIS — M25561 Pain in right knee: Secondary | ICD-10-CM

## 2015-04-05 DIAGNOSIS — M79609 Pain in unspecified limb: Secondary | ICD-10-CM | POA: Diagnosis not present

## 2015-04-05 DIAGNOSIS — M79604 Pain in right leg: Secondary | ICD-10-CM | POA: Insufficient documentation

## 2015-04-05 MED ORDER — DICLOFENAC SODIUM 75 MG PO TBEC: 75.0000 mg | DELAYED_RELEASE_TABLET | Freq: Two times a day (BID) | ORAL | Status: DC

## 2015-04-05 NOTE — Progress Notes (Signed)
Subjective:    Patient ID: Anthony Russell, male    DOB: 1980-10-07, 35 y.o.   MRN: [id number]  HPI   Pt in with some pain in his rt knee area. Describes when he first got up had sharp sensation. Soreness in the front and back. No fall or trauma. Pt has does work out at  [employer] past  month. Going twice a week. Pt does not report any injury. Stated he noticed some pain rt leg this weekend having sex with girlfriend. Faint pain on Saturday night. Sunday leg felt stiff. This am moderate pain.   Review of Systems  Constitutional: Negative for fever, chills and fatigue.  Respiratory: Negative for cough, chest tightness, shortness of breath and wheezing.   Cardiovascular: Negative for chest pain and palpitations.  Gastrointestinal: Negative for abdominal pain.  Genitourinary: Negative for urgency, frequency, hematuria, flank pain, scrotal swelling, difficulty urinating, penile pain and testicular pain.  Musculoskeletal: Negative for back pain.       Rt knee pain and pain hamstring/calf area.  Skin: Negative for rash.  Hematological: Negative for adenopathy. Does not bruise/bleed easily.  Psychiatric/Behavioral: Negative for behavioral problems and confusion.    Past Medical History  Diagnosis Date  . Allergy, unspecified not elsewhere classified     rx w/ OTC antihistamines PRN  . Atypical chest pain     recent neg eval w/ normal cxr/ekg  . Hyperlipidemia     on diet alone  . Overweight(278.02)     weight Jan10=246#.[name].he was 225# in 9/07...diet and exercise was discussed  . GERD (gastroesophageal reflux disease)     on protonix /d  . Dyspepsia     on protonix /d  . Lumbar back pain     s/p lumbar laminectomy 2007 by DrCabbell  . Allergy   . Anxiety   . Asthma   . Depression     Social History   Social History  . Marital Status: Single    Spouse Name: N/A  . Number of Children: 0  . Years of Education: N/A   Occupational History  . Not on file.   Social History  Main Topics  . Smoking status: Former Smoker -- 0.10 packs/day for 8 years    Types: Cigarettes    Quit date: 03/03/2011  . Smokeless tobacco: Never Used  . Alcohol Use: Yes     Comment: rare  . Drug Use: No  . Sexual Activity: Not on file   Other Topics Concern  . Not on file   Social History Narrative   3 cups caffeine a day    Past Surgical History  Procedure Laterality Date  . Lumbar laminectomy  2007    DrCabbell  . Tear duct probing      Family History  Problem Relation Age of Onset  . Allergies Mother   . Irritable bowel syndrome Mother   . Allergies Father   . Allergies Brother   . Colon cancer Neg Hx   . Irritable bowel syndrome Brother     No Known Allergies  Current Outpatient Prescriptions on File Prior to Visit  Medication Sig Dispense Refill  . Calcium-Magnesium-Zinc 500-250-12.5 MG TABS Take by mouth 3 (three) times daily.      . cetirizine (ZYRTEC ALLERGY) 10 MG tablet Take 10 mg by mouth daily as needed.     . chlorpheniramine-HYDROcodone (TUSSIONEX PENNKINETIC ER) 10-8 MG/5ML SUER Take 5 mLs by mouth every 12 (twelve) hours as needed. 140 mL 0  . dicyclomine (  BENTYL) 20 MG tablet Take 1 tablet (20 mg total) by mouth 3 (three) times daily as needed for spasms. 50 tablet 1  . fluticasone (FLOVENT HFA) 110 MCG/ACT inhaler Inhale 2 puffs into the lungs 2 (two) times daily. 1 Inhaler 2  . guaiFENesin (MUCINEX) 600 MG 12 hr tablet Take 600 mg by mouth daily.      . mometasone (NASONEX) 50 MCG/ACT nasal spray Place 2 sprays into the nose daily. 17 g 12  . pantoprazole (PROTONIX) 40 MG tablet Take 1 tablet (40 mg total) by mouth daily. 30 tablet 1  . venlafaxine XR (EFFEXOR XR) 75 MG 24 hr capsule Take 1 capsule (75 mg total) by mouth daily with breakfast. 30 capsule 0   No current facility-administered medications on file prior to visit.    BP 110/76 mmHg  Pulse 91  Temp(Src) 98.2 F (36.8 C) (Oral)  Ht 5\' 10"  (1.778 m)  Wt 268 lb 9.6 oz (121.836 kg)   BMI 38.54 kg/m2  SpO2 98%       Objective:   Physical Exam   General- No acute distress. Pleasant patient. Neck- Full range of motion, no jvd Lungs- Clear, even and unlabored. Heart- regular rate and rhythm. Neurologic- CNII- XII grossly intact.  Rt lower ext- rt knee. No crepitus, no swellin and no warmth. But mild pain on rom. No instablity.  Rt lower leg- on extension of lower ext reports some mild pain in hamstring and calf.      Assessment & Plan:  Will get xray of your  Rt knee and get  rt lower ext ultrasound.  Rest and rx of diclofenac.  Will follow xrays and see how you respond to diclofenac . If any worse pain notify us. May refer you to orthopedist.  Follow up 7 days or as needed

## 2015-04-05 NOTE — Telephone Encounter (Signed)
Pt advised on both results of ultrasound and xray. Continue diclofenac.

## 2015-04-05 NOTE — Progress Notes (Signed)
Pre visit review using our clinic review tool, if applicable. No additional management support is needed unless otherwise documented below in the visit note. 

## 2015-04-05 NOTE — Patient Instructions (Addendum)
Will get xray of your  Rt knee and get rt lower ext ultrasound.  Rest and rx of diclofenac.  Will follow xrays and see how you respond to diclofenac . If any worse pain notify us. May refer you to orthopedist.  Follow up 7 days or as needed

## 2015-04-06 ENCOUNTER — Telehealth: Payer: Self-pay | Admitting: Medical

## 2015-04-06 NOTE — Telephone Encounter (Signed)
Caller name: Self  Can be reached: [phone]   Reason for call: Request that note written to cover him for Out of Work yesterday be extended to cover today because his leg is still hurting

## 2015-04-06 NOTE — Telephone Encounter (Signed)
Note printed for work retuen on April 07, 2015.

## 2015-04-06 NOTE — Telephone Encounter (Signed)
Edward see note below and advise if ok to extend note.

## 2015-06-03 ENCOUNTER — Ambulatory Visit (INDEPENDENT_AMBULATORY_CARE_PROVIDER_SITE_OTHER): Payer: BLUE CROSS/BLUE SHIELD | Admitting: Medical

## 2015-06-03 ENCOUNTER — Encounter: Payer: Self-pay | Admitting: Medical

## 2015-06-03 VITALS — BP 122/84 | HR 78 | Temp 98.3°F | Ht 70.0 in | Wt 268.8 lb

## 2015-06-03 DIAGNOSIS — H6122 Impacted cerumen, left ear: Secondary | ICD-10-CM

## 2015-06-03 DIAGNOSIS — F419 Anxiety disorder, unspecified: Secondary | ICD-10-CM | POA: Diagnosis not present

## 2015-06-03 MED ORDER — AMOXICILLIN-POT CLAVULANATE 875-125 MG PO TABS: 1.0000 | ORAL_TABLET | Freq: Two times a day (BID) | ORAL | Status: DC

## 2015-06-03 MED ORDER — VENLAFAXINE HCL ER 75 MG PO CP24: 75.0000 mg | ORAL_CAPSULE | Freq: Every day | ORAL | Status: DC

## 2015-06-03 NOTE — Progress Notes (Signed)
Pre visit review using our clinic review tool, if applicable. No additional management support is needed unless otherwise documented below in the visit note. 

## 2015-06-03 NOTE — Progress Notes (Signed)
Subjective:    Patient ID: Anthony Russell, male    DOB: 1980/11/03, 35 y.o.   MRN: [id number]  HPI  Pt in for ears feeling clogged for 2 days. Hearing left side muffled. No preceding nasal congestion, runny nose, uri or allergy symptoms. Symptoms occurred after taking a shower.  Pt also wants refill of his effexor. Pt states when he was using it had more energy and less anxious. But he stopped briefly and now wants to get back on. Helped more with anxiety. Helped his mood as well. Last filled in august 2016. He states he stopped it since it effected his libido. But now he wants to get back on effexor.  Review of Systems  Constitutional: Negative for fever, chills and fatigue.  HENT: Negative for congestion, ear discharge, ear pain, mouth sores and sinus pressure.   Respiratory: Negative for cough, chest tightness, shortness of breath and wheezing.   Cardiovascular: Negative for chest pain and palpitations.  Gastrointestinal: Negative for abdominal pain.  Neurological: Negative for dizziness.  Psychiatric/Behavioral: Negative for suicidal ideas, confusion, dysphoric mood and agitation. The patient is nervous/anxious.     Past Medical History  Diagnosis Date  . Allergy, unspecified not elsewhere classified     rx w/ OTC antihistamines PRN  . Atypical chest pain     recent neg eval w/ normal cxr/ekg  . Hyperlipidemia     on diet alone  . Overweight(278.02)     weight Jan10=246#.[name].he was 225# in 9/07...diet and exercise was discussed  . GERD (gastroesophageal reflux disease)     on protonix /d  . Dyspepsia     on protonix /d  . Lumbar back pain     s/p lumbar laminectomy 2007 by DrCabbell  . Allergy   . Anxiety   . Asthma   . Depression      Social History   Social History  . Marital Status: Single    Spouse Name: N/A  . Number of Children: 0  . Years of Education: N/A   Occupational History  . Not on file.   Social History Main Topics  . Smoking status:  Former Smoker -- 0.10 packs/day for 8 years    Types: Cigarettes    Quit date: 03/03/2011  . Smokeless tobacco: Never Used  . Alcohol Use: Yes     Comment: rare  . Drug Use: No  . Sexual Activity: Not on file   Other Topics Concern  . Not on file   Social History Narrative   3 cups caffeine a day    Past Surgical History  Procedure Laterality Date  . Lumbar laminectomy  2007    DrCabbell  . Tear duct probing      Family History  Problem Relation Age of Onset  . Allergies Mother   . Irritable bowel syndrome Mother   . Allergies Father   . Allergies Brother   . Colon cancer Neg Hx   . Irritable bowel syndrome Brother     No Known Allergies  Current Outpatient Prescriptions on File Prior to Visit  Medication Sig Dispense Refill  . Calcium-Magnesium-Zinc 500-250-12.5 MG TABS Take by mouth 3 (three) times daily.      . cetirizine (ZYRTEC ALLERGY) 10 MG tablet Take 10 mg by mouth daily as needed.     . chlorpheniramine-HYDROcodone (TUSSIONEX PENNKIOSEIAS HORSEY0-8 MG/5ML SUER Take 5 mLs by mouth every 12 (twelve) hours as needed. 140 mL 0  . diclofenac (VOLTAREN) 75 MG EC tablet Take  1 tablet (75 mg total) by mouth 2 (two) times daily. 30 tablet 0  . dicyclomine (BENTYL) 20 MG tablet Take 1 tablet (20 mg total) by mouth 3 (three) times daily as needed for spasms. 50 tablet 1  . guaiFENesin (MUCINEX) 600 MG 12 hr tablet Take 600 mg by mouth daily.      . mometasone (NASONEX) 50 MCG/ACT nasal spray Place 2 sprays into the nose daily. 17 g 12  . pantoprazole (PROTONIX) 40 MG tablet Take 1 tablet (40 mg total) by mouth daily. 30 tablet 1  . venlafaxine XR (EFFEXOR XR) 75 MG 24 hr capsule Take 1 capsule (75 mg total) by mouth daily with breakfast. 30 capsule 0   No current facility-administered medications on file prior to visit.    BP 122/84 mmHg  Pulse 78  Temp(Src) 98.3 F (36.8 C) (Oral)  Ht 5\' 10"  (1.778 m)  Wt 268 lb 12.8 oz (121.927 kg)  BMI 38.57 kg/m2  SpO2  98%       Objective:   Physical Exam  General  Mental Status - Alert. General Appearance - Well groomed. Not in acute distress.  Skin Rashes- No Rashes.  HEENT Head- Normal. Ear Auditory Canal - Left- Normal. Right - Normal.Tympanic Membrane- Left- moderate cerumen impaction Right- Normal.(post lavage left ear majority of wax clear. Tm intact but red. ) Eye Sclera/Conjunctiva- Left- Normal. Right- Normal. Nose & Sinuses Nasal Mucosa- Left-  Boggy and Congested. Right-  Boggy and  Congested.Bilateral no  maxillary and no  frontal sinus pressure. Mouth & Throat Lips: Upper Lip- Normal: no dryness, cracking, pallor, cyanosis, or vesicular eruption. Lower Lip-Normal: no dryness, cracking, pallor, cyanosis or vesicular eruption. Buccal Mucosa- Bilateral- No Aphthous ulcers. Oropharynx- No Discharge or Erythema. Tonsils: Characteristics- Bilateral- No Erythema or Congestion. Size/Enlargement- Bilateral- No enlargement. Discharge- bilateral-None.  Neck Neck- Supple. No Masses.   Chest and Lung Exam Auscultation: Breath Sounds:-Clear even and unlabored.  Cardiovascular Auscultation:Rythm- Regular, rate and rhythm. Murmurs & Other Heart Sounds:Ausculatation of the heart reveal- No Murmurs.  Lymphatic Head & Neck General Head & Neck Lymphatics: Bilateral: Description- No Localized lymphadenopathy.       Assessment & Plan:  Your wax was removed successfully. Advise if you have wax again in future or clogged sensation then use debrox before coming in for lavage.  Currently redness of tm may be from procedure. But if later you have ear pain then start augmentin.  For anxiety will refill your effexor.  Follow up in 3 months or prn.  Recommend 10 day follow up  if you have to start antibiotic.  Brittinie Wherley, Ramon DredgeEdward, PA-C

## 2015-06-03 NOTE — Patient Instructions (Addendum)
Your wax was removed successfully. Advise if you have wax again in future or clogged sensation then use debrox before coming in for lavage.  Currently redness of tm may be from procedure. But if later you have ear pain then start augmentin.  For anxiety will refill your effexor.  Follow up in 3 months or prn.  Recommend 10 days follow up if you have to start antibiotic

## 2015-06-15 ENCOUNTER — Ambulatory Visit (INDEPENDENT_AMBULATORY_CARE_PROVIDER_SITE_OTHER): Payer: BLUE CROSS/BLUE SHIELD | Admitting: Internal Medicine

## 2015-06-15 ENCOUNTER — Encounter: Payer: Self-pay | Admitting: Internal Medicine

## 2015-06-15 VITALS — BP 118/78 | HR 86 | Temp 98.4°F | Ht 70.0 in | Wt 264.2 lb

## 2015-06-15 DIAGNOSIS — E291 Testicular hypofunction: Secondary | ICD-10-CM

## 2015-06-15 DIAGNOSIS — R739 Hyperglycemia, unspecified: Secondary | ICD-10-CM | POA: Diagnosis not present

## 2015-06-15 DIAGNOSIS — R5383 Other fatigue: Secondary | ICD-10-CM | POA: Diagnosis not present

## 2015-06-15 DIAGNOSIS — R7989 Other specified abnormal findings of blood chemistry: Secondary | ICD-10-CM

## 2015-06-15 NOTE — Patient Instructions (Signed)
GO TO THE FRONT DESK Schedule your next appointment for a follow-up with your primary doctor in 2 weeks   Schedule labs to be done tomorrow morning, fasting   Continue with Effexor

## 2015-06-15 NOTE — Progress Notes (Signed)
Subjective:    Patient ID: Anthony Russell, male    DOB: 04/17/1980, 35 y.o.   MRN: [id number]  DOS:  06/15/2015 Type of visit - description : Acute Interval history: For the last 2 weeks is not feeling well, complains of fatigue, when asked to describe his symptoms is actually feeling sleepy. Having a hard time getting out of bed, when he's able to get out of bed and goes to work, he feels like he could fall asleep anytime. At home he remains very sleepy despite sleeping many hours at night.  He self discontinue Effexor several months ago d/t sexual s/e , it worked relatively well for depression. He has been feeling depressed for a few months now and he just went back on Effexor 2 days ago.  When asked, admits to severe snoring for a while, few years ago (2004?) was tested for sleep apnea and was told it was okay.  Wt Readings from Last 3 Encounters:  06/15/15 264 lb 4 oz (119.863 kg)  06/03/15 268 lb 12.8 oz (121.927 kg)  04/05/15 268 lb 9.6 oz (121.836 kg)     Review of Systems Denies any fever chills. No major changes on his weight in the last few months although he has been trying to lose weight Denies chest pain, difficulty breathing, dyspnea on exertion, palpitations. No nausea, vomiting, diarrhea. No blood in the stools. No suicidal ideas.  Past Medical History  Diagnosis Date  . Allergy, unspecified not elsewhere classified     rx w/ OTC antihistamines PRN  . Atypical chest pain     recent neg eval w/ normal cxr/ekg  . Hyperlipidemia     on diet alone  . Overweight(278.02)     weight Jan10=246#.[name]..he was 225# in 9/07...diet and exercise was discussed  . GERD (gastroesophageal reflux disease)     on protonix 40mg /d  . Dyspepsia     on protonix 40mg /d  . Lumbar back pain     s/p lumbar laminectomy 2007 by DrCabbell  . Allergy   . Anxiety   . Asthma   . Depression     Past Surgical History  Procedure Laterality Date  . Lumbar laminectomy  2007    DrCabbell    . Tear duct probing      Social History   Social History  . Marital Status: Single    Spouse Name: N/A  . Number of Children: 0  . Years of Education: N/A   Occupational History  . software     Social History Main Topics  . Smoking status: Former Smoker -- 0.10 packs/day for 8 years    Types: Cigarettes    Quit date: 03/03/2011  . Smokeless tobacco: Never Used  . Alcohol Use: Yes     Comment: rare  . Drug Use: No  . Sexual Activity: Not on file   Other Topics Concern  . Not on file   Social History Narrative   3 cups caffeine a day        Medication List       This list is accurate as of: 06/15/15 11:59 PM.  Always use your most recent med list.               Calcium-Magnesium-Zinc 500-250-12.5 MG Tabs  Take by mouth 3 (three) times daily.     dicyclomine 20 MG tablet  Commonly known as:  BENTYL  Take 1 tablet (20 mg total) by mouth 3 (three) times daily as needed for spasms.  pantoprazole 40 MG tablet  Commonly known as:  PROTONIX  Take 1 tablet (40 mg total) by mouth daily.     venlafaxine XR 75 MG 24 hr capsule  Commonly known as:  EFFEXOR XR  Take 1 capsule (75 mg total) by mouth daily with breakfast.     ZYRTEC ALLERGY 10 MG tablet  Generic drug:  cetirizine  Take 10 mg by mouth daily as needed.           Objective:   Physical Exam BP 118/78 mmHg  Pulse 86  Temp(Src) 98.4 F (36.9 C) (Oral)  Ht  (1.778 m)  Wt 264 lb 4 oz (119.863 kg)  BMI 37.92 kg/m2  SpO2 98% General:   Well developed, well nourished . NAD.  HEENT:  Normocephalic . Face symmetric, atraumatic Lungs:  CTA B Normal respiratory effort, no intercostal retractions, no accessory muscle use. Heart: RRR,  no murmur.  no pretibial edema bilaterally  Abdomen:  Not distended, soft, slightly TTP at the upper abdomen without mass, no rebound or rigidity.  Skin: Not pale. Not jaundice Neurologic:  alert & oriented X3.  Speech normal, gait appropriate for age  and unassisted Psych--  Cognition and judgment appear intact.  Cooperative with normal attention span and concentration.  Behavior appropriate. No anxious or depressed appearing.    Assessment & Plan:    Fatigue: The patient reports fatigue, description of symptoms is more consistent with hypersomnolence despite "sleeping many hours at night". He is a heavy snorer, reports a remote negative  sleep study. Has a history of low testosterone. Blood sugar was elevated before. Plan: CMP, CBC, TSH, B12, folic acid, testosterone, A1c. Labs fasting tomorrow Continue with Effexor for the treatment of depression (mood may be playing a role on his sx) RTC to see PCP in 2 weeks, if labs ok and sx continue, will rec further reevaluation for sleep apnea as he is at high risk for such condition

## 2015-06-15 NOTE — Progress Notes (Signed)
Pre visit review using our clinic review tool, if applicable. No additional management support is needed unless otherwise documented below in the visit note. 

## 2015-06-16 ENCOUNTER — Other Ambulatory Visit (INDEPENDENT_AMBULATORY_CARE_PROVIDER_SITE_OTHER): Payer: BLUE CROSS/BLUE SHIELD

## 2015-06-16 DIAGNOSIS — R739 Hyperglycemia, unspecified: Secondary | ICD-10-CM | POA: Diagnosis not present

## 2015-06-16 DIAGNOSIS — R5383 Other fatigue: Secondary | ICD-10-CM | POA: Diagnosis not present

## 2015-06-16 DIAGNOSIS — R7989 Other specified abnormal findings of blood chemistry: Secondary | ICD-10-CM

## 2015-06-16 DIAGNOSIS — E291 Testicular hypofunction: Secondary | ICD-10-CM | POA: Diagnosis not present

## 2015-06-16 LAB — COMPREHENSIVE METABOLIC PANEL
ALT: 41 U/L (ref 0–53)
AST: 20 U/L (ref 0–37)
Albumin: 4.1 g/dL (ref 3.5–5.2)
Alkaline Phosphatase: 70 U/L (ref 39–117)
BUN: 10 mg/dL (ref 6–23)
CO2: 30 meq/L (ref 19–32)
CREATININE: 1.14 mg/dL (ref 0.40–1.50)
Calcium: 9.3 mg/dL (ref 8.4–10.5)
Chloride: 104 mEq/L (ref 96–112)
GFR: 77.8 mL/min (ref >60.00)
Glucose, Bld: 152 mg/dL — ABNORMAL HIGH (ref 70–99)
Potassium: 3.7 mEq/L (ref 3.5–5.1)
SODIUM: 140 meq/L (ref 135–145)
Total Bilirubin: 0.4 mg/dL (ref 0.2–1.2)
Total Protein: 7.1 g/dL (ref 6.0–8.3)

## 2015-06-16 LAB — CBC WITH DIFFERENTIAL/PLATELET
BASOS ABS: 0.1 10*3/uL (ref 0.0–0.1)
BASOS PCT: 1 % (ref 0.0–3.0)
EOS ABS: 0.1 10*3/uL (ref 0.0–0.7)
Eosinophils Relative: 1.1 % (ref 0.0–5.0)
HCT: 44.1 % (ref 39.0–52.0)
Hemoglobin: 15 g/dL (ref 13.0–17.0)
LYMPHS ABS: 2.3 10*3/uL (ref 0.7–4.0)
Lymphocytes Relative: 28.3 % (ref 12.0–46.0)
MCHC: 34 g/dL (ref 30.0–36.0)
MCV: 89.2 fl (ref 78.0–100.0)
MONO ABS: 0.6 10*3/uL (ref 0.1–1.0)
Monocytes Relative: 7.1 % (ref 3.0–12.0)
NEUTROS ABS: 5.2 10*3/uL (ref 1.4–7.7)
NEUTROS PCT: 62.5 % (ref 43.0–77.0)
PLATELETS: 230 10*3/uL (ref 150.0–400.0)
RBC: 4.95 Mil/uL (ref 4.22–5.81)
RDW: 14 % (ref 11.5–15.5)
WBC: 8.3 10*3/uL (ref 4.0–10.5)

## 2015-06-16 LAB — VITAMIN B12: Vitamin B-12: 217 pg/mL (ref 211–911)

## 2015-06-16 LAB — HEMOGLOBIN A1C: HEMOGLOBIN A1C: 7.4 % — AB (ref 4.6–6.5)

## 2015-06-16 LAB — FOLATE: Folate: 6.9 ng/mL (ref >5.9)

## 2015-06-16 LAB — TSH: TSH: 1.65 u[IU]/mL (ref 0.35–4.50)

## 2015-06-18 LAB — VITAMIN D 1,25 DIHYDROXY
VITAMIN D 1, 25 (OH) TOTAL: 50 pg/mL (ref 18–72)
Vitamin D2 1, 25 (OH)2: <8 pg/mL
Vitamin D3 1, 25 (OH)2: 50 pg/mL

## 2015-06-19 LAB — TESTOS,TOTAL,FREE AND SHBG (FEMALE)
SEX HORMONE BINDING GLOB.: 11 nmol/L (ref 10–50)
Testosterone, Free: 67.6 pg/mL (ref 35.0–155.0)
Testosterone,Total,LC/MS/MS: 302 ng/dL (ref 250–1100)

## 2015-06-21 NOTE — Progress Notes (Signed)
Quick Note:  Pt has seen results on MyChart and message also sent for patient to call back if any questions. ______ 

## 2015-07-01 ENCOUNTER — Ambulatory Visit: Payer: BLUE CROSS/BLUE SHIELD | Admitting: Medical

## 2015-07-08 ENCOUNTER — Ambulatory Visit (INDEPENDENT_AMBULATORY_CARE_PROVIDER_SITE_OTHER): Payer: BLUE CROSS/BLUE SHIELD | Admitting: Medical

## 2015-07-08 ENCOUNTER — Encounter: Payer: Self-pay | Admitting: Medical

## 2015-07-08 VITALS — BP 120/80 | HR 87 | Temp 98.0°F | Ht 70.0 in | Wt 267.6 lb

## 2015-07-08 DIAGNOSIS — R5383 Other fatigue: Secondary | ICD-10-CM

## 2015-07-08 DIAGNOSIS — R0683 Snoring: Secondary | ICD-10-CM | POA: Diagnosis not present

## 2015-07-08 DIAGNOSIS — F329 Major depressive disorder, single episode, unspecified: Secondary | ICD-10-CM | POA: Diagnosis not present

## 2015-07-08 DIAGNOSIS — E119 Type 2 diabetes mellitus without complications: Secondary | ICD-10-CM

## 2015-07-08 DIAGNOSIS — F32A Depression, unspecified: Secondary | ICD-10-CM

## 2015-07-08 MED ORDER — METFORMIN HCL 500 MG PO TABS: 500.0000 mg | ORAL_TABLET | Freq: Two times a day (BID) | ORAL | Status: DC

## 2015-07-08 MED ORDER — VENLAFAXINE HCL ER 150 MG PO CP24
150.0000 mg | ORAL_CAPSULE | Freq: Every day | ORAL | Status: DC
Start: 2015-07-08 — End: 2016-10-27

## 2015-07-08 NOTE — Progress Notes (Signed)
Subjective:    Patient ID: Anthony Russell, male    DOB: March 14, 1980, 35 y.o.   MRN: [id number]  HPI  Pt seen by Dr. Drue Novel. Pt labs looked ok.  Pt testosterone was in low normal range. Recent a1-c indicates diabetes. Pt cbc was normal. b12 normal range but on low side.  Pt does snore. Told by friends and girfriend. Recenlty feeling fatigued for a few months.  Pt has hx of depression. I placed him on Effexor. He seems have been lacking motivation recently. Feeling like mood down. Pt not feeling anxious. Pt did break up with girlfreind about 3 months ago. 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    Review of Systems  HENT: Negative for congestion, drooling and ear pain.   Respiratory: Negative for cough and chest tightness.   Cardiovascular: Negative for chest pain and palpitations.  Gastrointestinal: Negative for abdominal pain.  Genitourinary: Negative for dysuria.  Musculoskeletal: Negative for back pain.  Neurological: Negative for dizziness, seizures, numbness and headaches.  Hematological: Negative for adenopathy. Does not bruise/bleed easily.  Psychiatric/Behavioral: Positive for dysphoric mood. Negative for suicidal ideas and behavioral problems. The patient is not nervous/anxious.        Recently feeling more depressed than anxious. In past felt more anxious.    Past Medical History  Diagnosis Date  . Allergy, unspecified not elsewhere classified     rx w/ OTC antihistamines PRN  . Atypical chest pain     recent neg eval w/ normal cxr/ekg  . Hyperlipidemia     on diet alone  . Overweight(278.02)     weight Jan10=246#.[name].he was 225# in 9/07...diet and exercise was discussed  . GERD (gastroesophageal reflux disease)     on protonix /d  . Dyspepsia     on protonix /d  . Lumbar back pain     s/p lumbar laminectomy 2007 by DrCabbell  . Allergy   . Anxiety   . Asthma   . Depression      Social History   Social History  . Marital  Status: Single    Spouse Name: N/A  . Number of Children: 0  . Years of Education: N/A   Occupational History  . software     Social History Main Topics  . Smoking status: Former Smoker -- 0.10 packs/day for 8 years    Types: Cigarettes    Quit date: 03/03/2011  . Smokeless tobacco: Never Used  . Alcohol Use: Yes     Comment: rare  . Drug Use: No  . Sexual Activity: Not on file   Other Topics Concern  . Not on file   Social History Narrative   3 cups caffeine a day    Past Surgical History  Procedure Laterality Date  . Lumbar laminectomy  2007    DrCabbell  . Tear duct probing      Family History  Problem Relation Age of Onset  . Allergies Mother   . Irritable bowel syndrome Mother   . Allergies Father   . Allergies Brother   . Colon cancer Neg Hx   . Irritable bowel syndrome Brother     No Known Allergies  Current Outpatient Prescriptions on File Prior to Visit  Medication Sig Dispense Refill  . Calcium-Magnesium-Zinc 500-250-12.5 MG TABS Take by mouth 3 (three) times daily.      . cetirizine (ZYRTEC ALLERGY) 10 MG tablet Take 10 mg by mouth daily as needed.     [name]  dicyclomine (BENTYL) 20 MG tablet Take 1 tablet (20 mg total) by mouth 3 (three) times daily as needed for spasms. 50 tablet 1  . pantoprazole (PROTONIX) 40 MG tablet Take 1 tablet (40 mg total) by mouth daily. 30 tablet 1  . venlafaxine XR (EFFEXOR XR) 75 MG 24 hr capsule Take 1 capsule (75 mg total) by mouth daily with breakfast. 30 capsule 3   No current facility-administered medications on file prior to visit.    BP 120/80 mmHg  Pulse 87  Temp(Src) 98 F (36.7 C) (Oral)  Ht 5\' 10"  (1.778 m)  Wt 267 lb 9.6 oz (121.383 kg)  BMI 38.40 kg/m2  SpO2 97%       Objective:   Physical Exam  General Mental Status- Alert. General Appearance- Not in acute distress.   Skin General: Color- Normal Color. Moisture- Normal Moisture.  Neck Carotid Arteries- Normal color. Moisture- Normal  Moisture. No carotid bruits. No JVD.  Chest and Lung Exam Auscultation: Breath Sounds:-Normal.  Cardiovascular Auscultation:Rythm- Regular. Murmurs & Other Heart Sounds:Auscultation of the heart reveals- No Murmurs.  Abdomen Inspection:-Inspeection Normal. Palpation/Percussion:Note:No mass. Palpation and Percussion of the abdomen reveal- Non Tender, Non Distended + BS, no rebound or guarding.    Neurologic Cranial Nerve exam:- CN III-XII intact(No nystagmus), symmetric smile. Drift Test:- No drift. Romberg Exam:- Negative.  Heal to Toe Gait exam:-Normal. Finger to Nose:- Normal/Intact Strength:- 5/5 equal and symmetric strength both upper and lower extremities.      Assessment & Plan:  For diabetes will rx metformin. Will try to refer you to nutritionist as well.  For depression I can increase your effexor dose. If this dose not help then will refer you back to Dr. Donell BeersPlovsky.  For snoring and fatigue will refer you to pulmonologist for evaluation sleep apnea.  Testosterone is low but I would not recommend supplementation. It is possible  with weight loss may increase. But plan to repeat levels if dropping then may refer to Endocrinologist.  Follow up in one month or as needed   Huma Imhoff, Ramon DredgeEdward, VF CorporationPA-C

## 2015-07-08 NOTE — Progress Notes (Signed)
Pre visit review using our clinic review tool, if applicable. No additional management support is needed unless otherwise documented below in the visit note. 

## 2015-07-08 NOTE — Patient Instructions (Signed)
For diabetes will rx metformin. Will try to refer you to nutritionist as well.  For depression I can increase your effexor dose. If this dose not help then will refer you back to Dr. Donell BeersPlovsky.  For snoring and fatigue will refer you to pulmonologist for evaluation sleep apnea.  Testosterone is low but I would not recommend supplementation. It is possible  with weight loss may increase. But plan to repeat levels if dropping then may refer to Endocrinologist.  Follow up in one month or as needed

## 2015-07-12 ENCOUNTER — Telehealth: Payer: Self-pay

## 2015-07-12 NOTE — Telephone Encounter (Signed)
The next available sleep consult is on 09/23/15. We do not double book for consults. Pt will have to take next available. lmtcb x1 for pt.

## 2015-07-14 NOTE — Telephone Encounter (Signed)
LMTCB

## 2015-07-15 NOTE — Telephone Encounter (Signed)
lmtcb x3 for pt. 

## 2015-07-16 NOTE — Telephone Encounter (Signed)
We have not received a call back from the pt as of today. This message will be closed per triage protocol.

## 2015-07-19 ENCOUNTER — Telehealth: Payer: Self-pay | Admitting: Medical

## 2015-07-19 NOTE — Telephone Encounter (Signed)
Attempted to reach patient 6/16 and 6/19 to reschedule appointment on 7/6 with E. Saguier. Left message. Appointment cancelled.

## 2015-08-05 ENCOUNTER — Ambulatory Visit: Payer: BLUE CROSS/BLUE SHIELD | Admitting: Medical

## 2015-08-23 ENCOUNTER — Encounter: Payer: Self-pay | Admitting: Family Medicine

## 2015-08-23 ENCOUNTER — Ambulatory Visit (INDEPENDENT_AMBULATORY_CARE_PROVIDER_SITE_OTHER): Payer: BLUE CROSS/BLUE SHIELD | Admitting: Family Medicine

## 2015-08-23 ENCOUNTER — Encounter: Payer: Self-pay | Admitting: *Deleted

## 2015-08-23 VITALS — BP 112/84 | HR 90 | Temp 98.4°F | Ht 70.0 in | Wt 263.5 lb

## 2015-08-23 DIAGNOSIS — G4710 Hypersomnia, unspecified: Secondary | ICD-10-CM

## 2015-08-23 DIAGNOSIS — R400 Somnolence: Secondary | ICD-10-CM

## 2015-08-23 DIAGNOSIS — F419 Anxiety disorder, unspecified: Secondary | ICD-10-CM

## 2015-08-23 DIAGNOSIS — G479 Sleep disorder, unspecified: Secondary | ICD-10-CM | POA: Diagnosis not present

## 2015-08-23 DIAGNOSIS — Z6837 Body mass index (BMI) 37.0-37.9, adult: Secondary | ICD-10-CM | POA: Diagnosis not present

## 2015-08-23 NOTE — Progress Notes (Signed)
Pre visit review using our clinic review tool, if applicable. No additional management support is needed unless otherwise documented below in the visit note. 

## 2015-08-23 NOTE — Progress Notes (Signed)
HPI:  Anthony Russell is a pleasant 35 year old here for an acute visit for ongoing poor sleep and anxiety. He reports he has seen his PCP for this and is supposed to have a sleep study, but has not yet done this. He admits he has the number to call, but just has been busy at work and has not scheduled this. He admits to chronic anxiety, stress, poor sleep, snoring, ? Apneic spells and daytime somnolence. He reports his PCP has started him on Effexor for the anxiety and has recommended cognitive behavioral therapy, but he has not done the therapy yet. He reports the Effexor has been helping, but he had a poor night of sleep last night due to tossing and turning and anxiety. Since he did not sleep well, he did not go to work, and he would like a work note that he was seen today. He denies suicidal ideation, thoughts of self-harm, manic symptoms, recentt illness, fevers, cough, SOB, NVD,  or psychosis. He wishes to return to work tomorrow.   ROS: See pertinent positives and negatives per HPI.  Past Medical History:  Diagnosis Date  . Allergy   . Allergy, unspecified not elsewhere classified    rx w/ OTC antihistamines PRN  . Anxiety   . Asthma   . Atypical chest pain    recent neg eval w/ normal cxr/ekg  . Depression   . Dyspepsia    on protonix 40mg /d  . GERD (gastroesophageal reflux disease)    on protonix 40mg /d  . Hyperlipidemia    on diet alone  . Lumbar back pain    s/p lumbar laminectomy 2007 by DrCabbell  . Overweight(278.02)    weight Jan10=246#.[name].he was 225# in 9/07...diet and exercise was discussed    Past Surgical History:  Procedure Laterality Date  . LUMBAR LAMINECTOMY  2007   DrCabbell  . TEAR DUCT PROBING      Family History  Problem Relation Age of Onset  . Allergies Mother   . Irritable bowel syndrome Mother   . Allergies Father   . Allergies Brother   . Colon cancer Neg Hx   . Irritable bowel syndrome Brother     Social History   Social History  .  Marital status: Single    Spouse name: N/A  . Number of children: 0  . Years of education: N/A   Occupational History  . software     Social History Main Topics  . Smoking status: Former Smoker    Packs/day: 0.10    Years: 8.00    Types: Cigarettes    Quit date: 03/03/2011  . Smokeless tobacco: Never Used  . Alcohol use Yes     Comment: rare  . Drug use: No  . Sexual activity: Not Asked   Other Topics Concern  . None   Social History Narrative   3 cups caffeine a day     Current Outpatient Prescriptions:  .  Calcium-Magnesium-Zinc 500-250-12.5 MG TABS, Take by mouth 3 (three) times daily.  , Disp: , Rfl:  .  cetirizine (ZYRTEC ALLERGY) 10 MG tablet, Take 10 mg by mouth daily as needed. , Disp: , Rfl:  .  dicyclomine (BENTYL) 20 MG tablet, Take 1 tablet (20 mg total) by mouth 3 (three) times daily as needed for spasms., Disp: 50 tablet, Rfl: 1 .  metFORMIN (GLUCOPHAGE) 500 MG tablet, Take 1 tablet (500 mg total) by mouth 2 (two) times daily with a meal., Disp: 60 tablet, Rfl: 1 .  pantoprazole (PROTONIX) 40 MG tablet, Take 1 tablet (40 mg total) by mouth daily., Disp: 30 tablet, Rfl: 1 .  venlafaxine XR (EFFEXOR XR) 150 MG 24 hr capsule, Take 1 capsule (150 mg total) by mouth daily with breakfast., Disp: 30 capsule, Rfl: 0  EXAM:  Vitals:   08/23/15 1617  BP: 112/84  Pulse: 90  Temp: 98.4 F (36.9 C)  HR elevated on arrival, much better in exam room  Body mass index is 37.81 kg/m.  GENERAL: vitals reviewed and listed above, alert, oriented, appears well hydrated and in no acute distress  HEENT: atraumatic, conjunttiva clear, no obvious abnormalities on inspection of external nose and ears  NECK: no obvious masses on inspection  LUNGS: clear to auscultation bilaterally, no wheezes, rales or rhonchi, good air movement  CV: HRRR, no peripheral edema  MS: moves all extremities without noticeable abnormality  PSYCH: pleasant and cooperative, somewhat flat  affect  ASSESSMENT AND PLAN:  Discussed the following assessment and plan:  Sleep disorder  Anxiety  Daytime somnolence  BMI 37.0-37.9, adult  -agree with getting sleep study as he admits to snoring, ? Apneic spells, daytime somnolence - offered assistance in scheduling. He agrees to schedule. -advise CBT and discussed various options and provided him the [employer] behavioral health brochure and discussed several apps for his mobile device. He seemed interested. -encouraged regular exercise and healthy diet. -discussed sleep hygiene and provided handout -advised follow up with his PCP in a few weeks and as needed. -Patient advised to return or notify a doctor immediately if symptoms worsen or persist or new concerns arise.  Patient Instructions  BEFORE YOU LEAVE: -follow up: with your doctor in 1-2 weeks -work note seen today and may return to work  Schedule your sleep study  Start cognitive behavioral therapy - call the number provided and consider the apps  FOR IMPROVED SLEEP AND TO RESET YOUR SLEEP SCHEDULE:  exercise 30 minutes daily   go to bed and wake up at the same time   keep bedroom cool, dark and quiet   reserve bed for sleep - do not read, watch TV, etc in bed   If you toss and turn more then 15-20 minutes get out of bed and list thoughts/do quite activity then go back to bed; repeat as needed; do not worry about when you eventually fall asleep - still get up at the same time and turn on lights and take shower  get counseling   some people find that a half dose of benadryl, melatonin, tylenol pm or unisom on a few nights per week is helpful initially for a few weeks  seek help and treat any depression or anxiety  prescription strength sleep medications should only be used in severe cases of insomnia if other measures fail and should be used sparingly     Kriste Basque R., DO

## 2015-08-23 NOTE — Patient Instructions (Signed)
BEFORE YOU LEAVE: -follow up: with your doctor in 1-2 weeks -work note seen today and may return to work  Schedule your sleep study  Start cognitive behavioral therapy - call the number provided and consider the apps  FOR IMPROVED SLEEP AND TO RESET YOUR SLEEP SCHEDULE: []  exercise 30 minutes daily  []  go to bed and wake up at the same time  []  keep bedroom cool, dark and quiet  []  reserve bed for sleep - do not read, watch TV, etc in bed  []  If you toss and turn more then 15-20 minutes get out of bed and list thoughts/do quite activity then go back to bed; repeat as needed; do not worry about when you eventually fall asleep - still get up at the same time and turn on lights and take shower  [] get counseling  []  some people find that a half dose of benadryl, melatonin, tylenol pm or unisom on a few nights per week is helpful initially for a few weeks  [] seek help and treat any depression or anxiety  [] prescription strength sleep medications should only be used in severe cases of insomnia if other measures fail and should be used sparingly

## 2015-09-10 ENCOUNTER — Telehealth: Payer: Self-pay | Admitting: Medical

## 2015-09-10 NOTE — Telephone Encounter (Signed)
Pt says at last appt he and provider discussed a referral to ortho. He would like to go ahead with referral   CB: [phone]

## 2015-09-13 ENCOUNTER — Telehealth: Payer: Self-pay | Admitting: Medical

## 2015-09-13 DIAGNOSIS — G8929 Other chronic pain: Secondary | ICD-10-CM

## 2015-09-13 DIAGNOSIS — M25561 Pain in right knee: Principal | ICD-10-CM

## 2015-09-13 NOTE — Telephone Encounter (Signed)
I put in referral for rt knee pain. Pt called and request referral.

## 2015-09-13 NOTE — Telephone Encounter (Signed)
Please advise 

## 2015-09-22 DIAGNOSIS — M25561 Pain in right knee: Secondary | ICD-10-CM | POA: Diagnosis not present

## 2015-09-25 DIAGNOSIS — M25561 Pain in right knee: Secondary | ICD-10-CM | POA: Diagnosis not present

## 2015-10-08 DIAGNOSIS — M25561 Pain in right knee: Secondary | ICD-10-CM | POA: Diagnosis not present

## 2015-10-13 ENCOUNTER — Other Ambulatory Visit: Payer: Self-pay | Admitting: Physician Assistant

## 2015-10-22 ENCOUNTER — Institutional Professional Consult (permissible substitution): Payer: BLUE CROSS/BLUE SHIELD | Admitting: Pulmonary Disease

## 2015-11-03 ENCOUNTER — Encounter: Payer: Self-pay | Admitting: Pulmonary Disease

## 2015-11-03 ENCOUNTER — Ambulatory Visit (INDEPENDENT_AMBULATORY_CARE_PROVIDER_SITE_OTHER): Payer: BLUE CROSS/BLUE SHIELD | Admitting: Pulmonary Disease

## 2015-11-03 VITALS — BP 104/80 | HR 105 | Ht 70.0 in | Wt 265.4 lb

## 2015-11-03 DIAGNOSIS — E6609 Other obesity due to excess calories: Secondary | ICD-10-CM | POA: Diagnosis not present

## 2015-11-03 DIAGNOSIS — Z6838 Body mass index (BMI) 38.0-38.9, adult: Secondary | ICD-10-CM | POA: Diagnosis not present

## 2015-11-03 DIAGNOSIS — J4520 Mild intermittent asthma, uncomplicated: Secondary | ICD-10-CM

## 2015-11-03 DIAGNOSIS — G4710 Hypersomnia, unspecified: Secondary | ICD-10-CM | POA: Diagnosis not present

## 2015-11-03 DIAGNOSIS — E669 Obesity, unspecified: Secondary | ICD-10-CM | POA: Insufficient documentation

## 2015-11-03 NOTE — Progress Notes (Signed)
Subjective:    Patient ID: Anthony Russell, male    DOB: 02/15/1980, 35 y.o.   MRN: [id number]  HPI   This is the case of Anthony Russell, 35 y.o. Male, who was referred by Samul Dada  in consultation regarding possible OSA.   As you very well know, patient smoked cigarettes 2 per day for 10 yrs, quit in his 30s, has mild asthma on prn albuterol.   Patient has snoring, occasional witnessed apneas, gasping or choking. Has frequent awakenings at night. Sleeps 6-7 hrs/night. Has unrefreshed sleep in am.    He works at computers, gets sleepy at work.  Hypersomnia affects his fxnality.   (+) Sleeptalking. Has a lot of movement in sleep.   Pt had a sleep study in 2000 with was (-). Has gained 30-40 lbs since that time. More symptomatic now.   ESS 16.   Review of Systems  Constitutional: Negative.  Negative for fever and unexpected weight change.  HENT: Positive for congestion, sinus pressure and sneezing. Negative for dental problem, ear pain, nosebleeds, postnasal drip, rhinorrhea, sore throat and trouble swallowing.   Eyes: Negative.  Negative for redness and itching.  Respiratory: Positive for shortness of breath. Negative for cough, chest tightness and wheezing.   Cardiovascular: Negative.  Negative for palpitations and leg swelling.  Gastrointestinal: Negative.  Negative for nausea and vomiting.  Endocrine: Negative.   Genitourinary: Negative.  Negative for dysuria.  Musculoskeletal: Negative.  Negative for joint swelling.  Skin: Negative.  Negative for rash.  Allergic/Immunologic: Positive for environmental allergies.  Neurological: Positive for dizziness. Negative for headaches.  Hematological: Negative.  Does not bruise/bleed easily.  Psychiatric/Behavioral: Negative.  Negative for dysphoric mood. The patient is not nervous/anxious.    Past Medical History:  Diagnosis Date  . Allergy   . Allergy, unspecified not elsewhere classified    rx w/ OTC antihistamines PRN  .  Anxiety   . Asthma   . Atypical chest pain    recent neg eval w/ normal cxr/ekg  . Depression   . Dyspepsia    on protonix 40mg /d  . GERD (gastroesophageal reflux disease)    on protonix 40mg /d  . Hyperlipidemia    on diet alone  . Lumbar back pain    s/p lumbar laminectomy 2007 by DrCabbell  . Overweight(278.02)    weight Jan10=246#.[name].he was 225# in 9/07...diet and exercise was discussed   (-) CA, DVT  Family History  Problem Relation Age of Onset  . Allergies Mother   . Irritable bowel syndrome Mother   . Allergies Father   . Allergies Brother   . Colon cancer Neg Hx   . Irritable bowel syndrome Brother      Past Surgical History:  Procedure Laterality Date  . LUMBAR LAMINECTOMY  2007   DrCabbell  . TEAR DUCT PROBING      Social History   Social History  . Marital status: Single    Spouse name: N/A  . Number of children: 0  . Years of education: N/A   Occupational History  . software     Social History Main Topics  . Smoking status: Former Smoker    Packs/day: 0.10    Years: 8.00    Types: Cigarettes    Quit date: 03/03/2011  . Smokeless tobacco: Never Used  . Alcohol use Yes     Comment: rare  . Drug use: No  . Sexual activity: Not on file   Other Topics Concern  . Not  on file   Social History Narrative   3 cups caffeine a day   [place], [place] at [employer]  No Known Allergies   Outpatient Medications Prior to Visit  Medication Sig Dispense Refill  . Calcium-Magnesium-Zinc 500-250-12.5 MG TABS Take by mouth 3 (three) times daily.      . cetirizine (ZYRTEC ALLERGY) 10 MG tablet Take 10 mg by mouth daily as needed.     . metFORMIN (GLUCOPHAGE) 500 MG tablet Take 1 tablet (500 mg total) by mouth 2 (two) times daily with a meal. 60 tablet 1  . pantoprazole (PROTONIX) 40 MG tablet TAKE 1 TABLET(40 MG) BY MOUTH DAILY 30 tablet 0  . venlafaxine XR (EFFEXOR XR) 150 MG 24 hr capsule Take 1 capsule (150 mg total) by mouth daily with breakfast. 30  capsule 0  . dicyclomine (BENTYL) 20 MG tablet Take 1 tablet (20 mg total) by mouth 3 (three) times daily as needed for spasms. 50 tablet 1   No facility-administered medications prior to visit.    No orders of the defined types were placed in this encounter.       Objective:   Physical Exam  Vitals:  Vitals:   11/03/15 1502  BP: 104/80  Pulse: (!) 105  SpO2: 96%  Weight: 265 lb 6.4 oz (120.4 kg)  Height: 5\' 10"  (1.778 m)    Constitutional/General:  Pleasant, well-nourished, well-developed, not in any distress,  Comfortably seating.  Well kempt  Body mass index is 38.08 kg/m. Wt Readings from Last 3 Encounters:  11/03/15 265 lb 6.4 oz (120.4 kg)  08/23/15 263 lb 8 oz (119.5 kg)  07/08/15 267 lb 9.6 oz (121.4 kg)    Neck circumference: 18.5 in  HEENT: Pupils equal and reactive to light and accommodation. Anicteric sclerae. Normal nasal mucosa.   No oral  lesions,  mouth clear,  oropharynx clear, no postnasal drip. (-) Oral thrush. No dental caries.  Airway - Mallampati class IV  Neck: No masses. Midline trachea. No JVD, (-) LAD. (-) bruits appreciated.  Respiratory/Chest: Grossly normal chest. (-) deformity. (-) Accessory muscle use.  Symmetric expansion. (-) Tenderness on palpation.  Resonant on percussion.  Diminished BS on both lower lung zones. (-) wheezing, crackles, rhonchi (-) egophony  Cardiovascular: Regular rate and  rhythm, heart sounds normal, no murmur or gallops, no peripheral edema  Gastrointestinal:  Normal bowel sounds. Soft, non-tender. No hepatosplenomegaly.  (-) masses.   Musculoskeletal:  Normal muscle tone. Normal gait.   Extremities: Grossly normal. (-) clubbing, cyanosis.  (-) edema  Skin: (-) rash,lesions seen.   Neurological/Psychiatric : alert, oriented to time, place, person. Normal mood and affect          Assessment & Plan:  Hypersomnia Patient has snoring, occasional witnessed apneas, gasping or choking. Has  frequent awakenings at night. Sleeps 6-7 hrs/night. Has unrefreshed sleep in am.    He works at computers, gets sleepy at work.  Hypersomnia affects his fxnality.   (+) Sleeptalking. Has a lot of movement in sleep.   Pt had a sleep study in 2000 with was (-). Has gained 30-40 lbs since that time. More symptomatic now.   ESS 16.   Plan:  We discussed about the diagnosis of Obstructive Sleep Apnea (OSA) and implications of untreated OSA. We discussed about CPAP and BiPaP as possible treatment options.   We will schedule the patient for a sleep study. Plan for HST. Anticipate no issues with cpap. Very symptomatic.    Patient was  instructed to call the office if he/she has not heard back from the office 1-2 weeks after the sleep study.   Patient was instructed to call the office if he/she is having issues with the PAP device.   We discussed good sleep hygiene.   Patient was advised not to engage in activities requiring concentration and/or vigilance if he/she is sleepy.  Patient was advised not to drive if he/she is sleepy.    Asthma in adult Stable. On prn albuterol.   Obesity Weight reduction.    Thank you very much for letting me participate in this patient's care. Please do not hesitate to give me a call if you have any questions or concerns regarding the treatment plan.   Patient will follow up with me in 6-8 weeks    J. Alexis Frock, MD 11/03/2015   3:32 PM Pulmonary and Critical Care Medicine [facility] Pager: [phone] Office: [phone], Fax: [phone]

## 2015-11-03 NOTE — Patient Instructions (Signed)

## 2015-11-03 NOTE — Assessment & Plan Note (Signed)
Weight reduction 

## 2015-11-03 NOTE — Assessment & Plan Note (Signed)
Patient has snoring, occasional witnessed apneas, gasping or choking. Has frequent awakenings at night. Sleeps 6-7 hrs/night. Has unrefreshed sleep in am.    He works at computers, gets sleepy at work.  Hypersomnia affects his fxnality.   (+) Sleeptalking. Has a lot of movement in sleep.   Pt had a sleep study in 2000 with was (-). Has gained 30-40 lbs since that time. More symptomatic now.   ESS 16.   Plan:  We discussed about the diagnosis of Obstructive Sleep Apnea (OSA) and implications of untreated OSA. We discussed about CPAP and BiPaP as possible treatment options.   We will schedule the patient for a sleep study. Plan for HST. Anticipate no issues with cpap. Very symptomatic.    Patient was instructed to call the office if he/she has not heard back from the office 1-2 weeks after the sleep study.   Patient was instructed to call the office if he/she is having issues with the PAP device.   We discussed good sleep hygiene.   Patient was advised not to engage in activities requiring concentration and/or vigilance if he/she is sleepy.  Patient was advised not to drive if he/she is sleepy.

## 2015-11-03 NOTE — Assessment & Plan Note (Signed)
Stable. On prn albuterol 

## 2015-11-25 DIAGNOSIS — G4733 Obstructive sleep apnea (adult) (pediatric): Secondary | ICD-10-CM | POA: Diagnosis not present

## 2015-11-26 ENCOUNTER — Other Ambulatory Visit: Payer: Self-pay | Admitting: *Deleted

## 2015-11-26 ENCOUNTER — Telehealth: Payer: Self-pay | Admitting: Pulmonary Disease

## 2015-11-26 DIAGNOSIS — G4733 Obstructive sleep apnea (adult) (pediatric): Secondary | ICD-10-CM | POA: Diagnosis not present

## 2015-11-26 DIAGNOSIS — M25561 Pain in right knee: Secondary | ICD-10-CM | POA: Diagnosis not present

## 2015-11-26 DIAGNOSIS — G4710 Hypersomnia, unspecified: Secondary | ICD-10-CM

## 2015-11-26 NOTE — Telephone Encounter (Signed)
  Please call the pt and tell the pt the HOME SLEEP STUDY  showed OSA.   Pt stops breathing 14   times an hour.   Home sleep study was done on : 11/25/15  Please order autoCPAP 5-15 cm H2O. Patient will need a mask fitting session. Patient will need a 1 month download.   Patient needs to be seen by me or any of the NPs/APPs  4-6 weeks after obtaining the cpap machine. Let me know if you receive this.   Thanks!   J. Alexis FrockAngelo A de Dios, MD 11/26/2015, 9:38 AM

## 2015-12-01 ENCOUNTER — Other Ambulatory Visit: Payer: Self-pay

## 2015-12-01 DIAGNOSIS — G4733 Obstructive sleep apnea (adult) (pediatric): Secondary | ICD-10-CM

## 2015-12-01 NOTE — Telephone Encounter (Signed)
Called an spoke with Pt. And told him AD message. Pt. Agreed to being placed on CPAP and order has been placed. He understood to call once he receive his machine to schedule an appt for follow up. Nothing further needed at this time,.

## 2015-12-17 DIAGNOSIS — G4733 Obstructive sleep apnea (adult) (pediatric): Secondary | ICD-10-CM | POA: Diagnosis not present

## 2016-01-17 ENCOUNTER — Encounter: Payer: Self-pay | Admitting: Pulmonary Disease

## 2016-01-28 ENCOUNTER — Telehealth: Payer: Self-pay | Admitting: Pulmonary Disease

## 2016-01-28 NOTE — Telephone Encounter (Signed)
Spoke with pt. About his download per AD, pt. Was suppose to follow up with us back in Nov. Informed the pt. That he should get a follow up appointment as soon as he could because insurance will require it. He stated that he will call us back to get a follow up appointment. Nothing further is needed at this time.

## 2016-01-28 NOTE — Telephone Encounter (Signed)
   DL from 40/9811/19 x 1 month :  53%, AHI 1.2 On autocpap 5-15 cm water.   Pls tell pt cpap is working but he needs to use cpap at least 4 hrs/night.   Pollie MeyerJ. Angelo A de Dios, MD 01/28/2016, 9:13 AM [facility] Pager [phone] After 3 pm or if no answer, call [phone]

## 2016-02-08 ENCOUNTER — Encounter: Payer: Self-pay | Admitting: Physician Assistant

## 2016-02-08 ENCOUNTER — Ambulatory Visit (INDEPENDENT_AMBULATORY_CARE_PROVIDER_SITE_OTHER): Payer: BLUE CROSS/BLUE SHIELD | Admitting: Physician Assistant

## 2016-02-08 VITALS — BP 136/88 | HR 116 | Temp 98.7°F | Ht 70.0 in | Wt 260.4 lb

## 2016-02-08 DIAGNOSIS — K219 Gastro-esophageal reflux disease without esophagitis: Secondary | ICD-10-CM

## 2016-02-08 DIAGNOSIS — J029 Acute pharyngitis, unspecified: Secondary | ICD-10-CM

## 2016-02-08 LAB — POCT RAPID STREP A (OFFICE): RAPID STREP A SCREEN: NEGATIVE

## 2016-02-08 NOTE — Patient Instructions (Addendum)
Please take ibuprofen or tylenol for your symptoms. You may use a throat spray to help with your pain.  If your symptoms do not improve, please let us know.   Pharyngitis Pharyngitis is redness, pain, and swelling (inflammation) of your pharynx. What are the causes? Pharyngitis is usually caused by infection. Most of the time, these infections are from viruses (viral) and are part of a cold. However, sometimes pharyngitis is caused by bacteria (bacterial). Pharyngitis can also be caused by allergies. Viral pharyngitis may be spread from person to person by coughing, sneezing, and personal items or utensils (cups, forks, spoons, toothbrushes). Bacterial pharyngitis may be spread from person to person by more intimate contact, such as kissing. What are the signs or symptoms? Symptoms of pharyngitis include:  Sore throat.  Tiredness (fatigue).  Low-grade fever.  Headache.  Joint pain and muscle aches.  Skin rashes.  Swollen lymph nodes.  Plaque-like film on throat or tonsils (often seen with bacterial pharyngitis). How is this diagnosed? Your health care provider will ask you questions about your illness and your symptoms. Your medical history, along with a physical exam, is often all that is needed to diagnose pharyngitis. Sometimes, a rapid strep test is done. Other lab tests may also be done, depending on the suspected cause. How is this treated? Viral pharyngitis will usually get better in 3-4 days without the use of medicine. Bacterial pharyngitis is treated with medicines that kill germs (antibiotics). Follow these instructions at home:  Drink enough water and fluids to keep your urine clear or pale yellow.  Only take over-the-counter or prescription medicines as directed by your health care provider:  If you are prescribed antibiotics, make sure you finish them even if you start to feel better.  Do not take aspirin.  Get lots of rest.  Gargle with 8 oz of salt water (  tsp of salt per 1 qt of water) as often as every 1-2 hours to soothe your throat.  Throat lozenges (if you are not at risk for choking) or sprays may be used to soothe your throat. Contact a health care provider if:  You have large, tender lumps in your neck.  You have a rash.  You cough up green, yellow-brown, or bloody spit. Get help right away if:  Your neck becomes stiff.  You drool or are unable to swallow liquids.  You vomit or are unable to keep medicines or liquids down.  You have severe pain that does not go away with the use of recommended medicines.  You have trouble breathing (not caused by a stuffy nose). This information is not intended to replace advice given to you by your health care provider. Make sure you discuss any questions you have with your health care provider. Document Released: 01/16/2005 Document Revised: 06/24/2015 Document Reviewed: 09/23/2012 Elsevier Interactive Patient Education  2017 [employer]

## 2016-02-08 NOTE — Progress Notes (Signed)
Subjective:    Patient ID: Anthony Russell, male    DOB: 10/17/1980, 36 y.o.   MRN: [id number]  HPI  [name] is a 36 y/o male who presents with sore throat with painful swallowing, no fever, no cough that started yesterday. He has been sleeping poorly and noticed his symptoms worsening after one night in particular of poor sleep. He is not taking anything for his symptoms, other than Mucinex and Zyrtec that he has been taking daily. He reports that there have been multiple people at work who have been sick, none with confirmed strep or flu.   He states that he is drinking well, has had limited appetite x 1 day. He reports that he has had 3 glasses of orange juice this morning and had some GERD symptoms afterwards. He has a history of being on protonix and was not recently on them, but resumed them today. Denies changes in bowels, nausea, vomiting. Some early satiety.  Did not receive the flu shot this year.    Review of Systems See HPI  Past Medical History:  Diagnosis Date  . Allergy   . Allergy, unspecified not elsewhere classified    rx w/ OTC antihistamines PRN  . Anxiety   . Asthma   . Atypical chest pain    recent neg eval w/ normal cxr/ekg  . Depression   . Dyspepsia    on protonix 40mg /d  . GERD (gastroesophageal reflux disease)    on protonix 40mg /d  . Hyperlipidemia    on diet alone  . Lumbar back pain    s/p lumbar laminectomy 2007 by DrCabbell  . Overweight(278.02)    weight Jan10=246#.[name]..he was 225# in 9/07...diet and exercise was discussed     Social History   Social History  . Marital status: Single    Spouse name: N/A  . Number of children: 0  . Years of education: N/A   Occupational History  . software     Social History Main Topics  . Smoking status: Former Smoker    Packs/day: 0.10    Years: 8.00    Types: Cigarettes    Quit date: 03/03/2011  . Smokeless tobacco: Never Used  . Alcohol use Yes     Comment: rare  . Drug use: No  . Sexual  activity: Not on file   Other Topics Concern  . Not on file   Social History Narrative   3 cups caffeine a day    Past Surgical History:  Procedure Laterality Date  . LUMBAR LAMINECTOMY  2007   DrCabbell  . TEAR DUCT PROBING      Family History  Problem Relation Age of Onset  . Allergies Mother   . Irritable bowel syndrome Mother   . Allergies Father   . Allergies Brother   . Colon cancer Neg Hx   . Irritable bowel syndrome Brother     No Known Allergies  Current Outpatient Prescriptions on File Prior to Visit  Medication Sig Dispense Refill  . Calcium-Magnesium-Zinc 500-250-12.5 MG TABS Take by mouth 3 (three) times daily.      . cetirizine (ZYRTEC ALLERGY) 10 MG tablet Take 10 mg by mouth daily as needed.     . metFORMIN (GLUCOPHAGE) 500 MG tablet Take 1 tablet (500 mg total) by mouth 2 (two) times daily with a meal. 60 tablet 1  . pantoprazole (PROTONIX) 40 MG tablet TAKE 1 TABLET(40 MG) BY MOUTH DAILY 30 tablet 0  . venlafaxine XR (EFFEXOR XR) 150  MG 24 hr capsule Take 1 capsule (150 mg total) by mouth daily with breakfast. 30 capsule 0   No current facility-administered medications on file prior to visit.     BP 136/88 (BP Location: Left Arm, Patient Position: Sitting, Cuff Size: Large)   Pulse (!) 116   Temp 98.7 F (37.1 C) (Oral)   Ht 5\' 10"  (1.778 m)   Wt 260 lb 6.4 oz (118.1 kg)   SpO2 98%   BMI 37.36 kg/m       Objective:   Physical Exam  Constitutional: He appears well-developed and well-nourished. He is cooperative.  HENT:  Head: Normocephalic and atraumatic.  Right Ear: External ear and ear canal normal.  Left Ear: Tympanic membrane, external ear and ear canal normal. Tympanic membrane is not erythematous, not retracted and not bulging.  Nose: Right sinus exhibits no maxillary sinus tenderness and no frontal sinus tenderness. Left sinus exhibits no maxillary sinus tenderness and no frontal sinus tenderness.  Mouth/Throat: Uvula is midline.  Posterior oropharyngeal edema and posterior oropharyngeal erythema present. No oropharyngeal exudate.  R ear with cerumen impaction, unable to see TM  Tonsils with 1+ bilateral swelling and no exudates  Cardiovascular: Regular rhythm and normal heart sounds.  Tachycardia present.   Pulmonary/Chest: Effort normal and breath sounds normal.  No crackles or wheezes; lungs clear bilaterally  Abdominal: Soft. Normal appearance. There is tenderness in the epigastric area.  Neurological: He is alert.  Nursing note and vitals reviewed.   Rapid strep test: negative      Assessment & Plan:  1. Viral pharyngitis - POCT rapid strep A test negative - Low suspicion for the flu - Reviewed supportive care: hydration, tylenol, ibuprofen, throat spray, etc - Follow up with Ramon Dredge if symptoms do not improve  2. Gastroesophageal reflux disease, esophagitis presence not specified - Resume scheduled protonix - Limit acidic foods/juices as this seems to be triggering return of symptoms - Follow up with Ramon Dredge if symptoms do not improve  Jarold Motto PA-C 02/08/16

## 2016-02-08 NOTE — Progress Notes (Signed)
Pre visit review using our clinic review tool, if applicable. No additional management support is needed unless otherwise documented below in the visit note. 

## 2016-02-14 ENCOUNTER — Ambulatory Visit (INDEPENDENT_AMBULATORY_CARE_PROVIDER_SITE_OTHER): Payer: BLUE CROSS/BLUE SHIELD | Admitting: Medical

## 2016-02-14 ENCOUNTER — Encounter: Payer: Self-pay | Admitting: Medical

## 2016-02-14 VITALS — BP 139/86 | HR 98 | Temp 98.2°F | Resp 16 | Ht 70.0 in | Wt 261.2 lb

## 2016-02-14 DIAGNOSIS — R059 Cough, unspecified: Secondary | ICD-10-CM

## 2016-02-14 DIAGNOSIS — R05 Cough: Secondary | ICD-10-CM

## 2016-02-14 DIAGNOSIS — J209 Acute bronchitis, unspecified: Secondary | ICD-10-CM

## 2016-02-14 DIAGNOSIS — J0100 Acute maxillary sinusitis, unspecified: Secondary | ICD-10-CM

## 2016-02-14 MED ORDER — BENZONATATE 100 MG PO CAPS: 100.0000 mg | ORAL_CAPSULE | Freq: Three times a day (TID) | ORAL | 0 refills | Status: DC | PRN

## 2016-02-14 MED ORDER — DOXYCYCLINE HYCLATE 100 MG PO TABS: 100.0000 mg | ORAL_TABLET | Freq: Two times a day (BID) | ORAL | 0 refills | Status: DC

## 2016-02-14 MED ORDER — FLUTICASONE PROPIONATE 50 MCG/ACT NA SUSP: 2.0000 | Freq: Every day | NASAL | 1 refills | Status: DC

## 2016-02-14 NOTE — Patient Instructions (Addendum)
You appear to have bronchitis and possible sinus infection. Rest hydrate and tylenol for fever. I am prescribing cough medicine benzonatate, and doxycycline  antibiotic. For your nasal congestion flonase  You should gradually get better. If not then notify us and would recommend a chest xray.     Follow up in 7-10 days or as needed

## 2016-02-14 NOTE — Progress Notes (Signed)
Pre visit review using our clinic review tool, if applicable. No additional management support is needed unless otherwise documented below in the visit note/SLS  

## 2016-02-14 NOTE — Progress Notes (Signed)
Subjective:    Patient ID: Anthony Russell, male    DOB: 08-08-1980, 36 y.o.   MRN: [id number]  HPI  Pt in for some head congestion, sinus pressure, and chest congestion for about one week. With this he has some st and prodcuctive cough. Pt has some fatigue as well. Very faint achiness in the morning but very brief.  Pt was evaluated the 9th and told viral pharyngitis.  Supportive measure advised. But now worse with above signs and symptoms.      Review of Systems  Constitutional: Negative for chills, fatigue and fever.  HENT: Positive for congestion, sinus pain, sinus pressure and sore throat. Negative for trouble swallowing.   Respiratory: Positive for cough. Negative for chest tightness and wheezing.        Cough productive.  Cardiovascular: Negative for chest pain and palpitations.  Gastrointestinal: Negative for abdominal pain, diarrhea and nausea.  Genitourinary: Negative for dysuria.  Musculoskeletal: Positive for myalgias.       Very faint but not flu like.  Skin: Negative for rash.  Neurological: Negative for dizziness, weakness and light-headedness.  Hematological: Negative for adenopathy. Does not bruise/bleed easily.  Psychiatric/Behavioral: Negative for behavioral problems.    Past Medical History:  Diagnosis Date  . Allergy   . Allergy, unspecified not elsewhere classified    rx w/ OTC antihistamines PRN  . Anxiety   . Asthma   . Atypical chest pain    recent neg eval w/ normal cxr/ekg  . Depression   . Dyspepsia    on protonix 40mg /d  . GERD (gastroesophageal reflux disease)    on protonix 40mg /d  . Hyperlipidemia    on diet alone  . Lumbar back pain    s/p lumbar laminectomy 2007 by DrCabbell  . Overweight(278.02)    weight Jan10=246#.[name].he was 225# in 9/07...diet and exercise was discussed     Social History   Social History  . Marital status: Single    Spouse name: N/A  . Number of children: 0  . Years of education: N/A   Occupational  History  . software     Social History Main Topics  . Smoking status: Former Smoker    Packs/day: 0.10    Years: 8.00    Types: Cigarettes    Quit date: 03/03/2011  . Smokeless tobacco: Never Used  . Alcohol use Yes     Comment: rare  . Drug use: No  . Sexual activity: Not on file   Other Topics Concern  . Not on file   Social History Narrative   3 cups caffeine a day    Past Surgical History:  Procedure Laterality Date  . LUMBAR LAMINECTOMY  2007   DrCabbell  . TEAR DUCT PROBING      Family History  Problem Relation Age of Onset  . Allergies Mother   . Irritable bowel syndrome Mother   . Allergies Father   . Allergies Brother   . Colon cancer Neg Hx   . Irritable bowel syndrome Brother     No Known Allergies  Current Outpatient Prescriptions on File Prior to Visit  Medication Sig Dispense Refill  . Calcium-Magnesium-Zinc 500-250-12.5 MG TABS Take by mouth 3 (three) times daily.      . cetirizine (ZYRTEC ALLERGY) 10 MG tablet Take 10 mg by mouth daily as needed.     . metFORMIN (GLUCOPHAGE) 500 MG tablet Take 1 tablet (500 mg total) by mouth 2 (two) times daily with a meal. 60 tablet  1  . pantoprazole (PROTONIX) 40 MG tablet TAKE 1 TABLET(40 MG) BY MOUTH DAILY 30 tablet 0  . venlafaxine XR (EFFEXOR XR) 150 MG 24 hr capsule Take 1 capsule (150 mg total) by mouth daily with breakfast. 30 capsule 0   No current facility-administered medications on file prior to visit.     BP 139/86 (BP Location: Right Arm, Patient Position: Sitting, Cuff Size: Large)   Pulse 98   Temp 98.2 F (36.8 C) (Oral)   Resp 16   Ht 5\' 10"  (1.778 m)   Wt 261 lb 4 oz (118.5 kg)   SpO2 99%   BMI 37.49 kg/m       Objective:   Physical Exam  General  Mental Status - Alert. General Appearance - Well groomed. Not in acute distress.  Skin Rashes- No Rashes.  HEENT Head- Normal. Ear Auditory Canal - Left- Normal. Right - Normal.Tympanic Membrane- Left-  Mild dull tm. Right-  Normal. Eye Sclera/Conjunctiva- Left- Normal. Right- Normal. Nose & Sinuses Nasal Mucosa- Left-  Boggy and Congested. Right-  Boggy and  Congested.Bilateral maxillary but no frontal sinus pressure. Mouth & Throat Lips: Upper Lip- Normal: no dryness, cracking, pallor, cyanosis, or vesicular eruption. Lower Lip-Normal: no dryness, cracking, pallor, cyanosis or vesicular eruption. Buccal Mucosa- Bilateral- No Aphthous ulcers. Oropharynx- No Discharge or Erythema. Tonsils: Characteristics- Bilateral- mild Erythema or Congestion. Size/Enlargement- Bilateral- No enlargement. Discharge- bilateral-None.  Neck Neck- Supple. No Masses.   Chest and Lung Exam Auscultation: Breath Sounds:-Clear even and unlabored.  Cardiovascular Auscultation:Rythm- Regular, rate and rhythm. Murmurs & Other Heart Sounds:Ausculatation of the heart reveal- No Murmurs.  Lymphatic Head & Neck General Head & Neck Lymphatics: Bilateral: Description- No Localized lymphadenopathy.       Assessment & Plan:  You appear to have bronchitis and possible sinus infection. Rest hydrate and tylenol for fever. I am prescribing cough medicine benzonatate, and doxycycline  antibiotic. For your nasal congestion flonase  You should gradually get better. If not then notify us and would recommend a chest xray.     Follow up in 7-10 days or as needed

## 2016-02-22 DIAGNOSIS — G4733 Obstructive sleep apnea (adult) (pediatric): Secondary | ICD-10-CM | POA: Diagnosis not present

## 2016-03-24 DIAGNOSIS — G4733 Obstructive sleep apnea (adult) (pediatric): Secondary | ICD-10-CM | POA: Diagnosis not present

## 2016-09-16 DIAGNOSIS — G8929 Other chronic pain: Secondary | ICD-10-CM | POA: Diagnosis not present

## 2016-09-16 DIAGNOSIS — M25561 Pain in right knee: Secondary | ICD-10-CM | POA: Diagnosis not present

## 2016-09-20 ENCOUNTER — Telehealth: Payer: Self-pay | Admitting: Medical

## 2016-09-21 NOTE — Telephone Encounter (Signed)
Pt is due for follow up please call and schedule.  

## 2016-09-22 NOTE — Telephone Encounter (Signed)
lvm for pt to call back to schedule apt.  °

## 2016-10-13 ENCOUNTER — Other Ambulatory Visit: Payer: Self-pay | Admitting: Medical

## 2016-10-16 NOTE — Telephone Encounter (Signed)
Pt is due for follow up please call and schedule. Only sending in 30 day supply of metformin

## 2016-10-25 ENCOUNTER — Other Ambulatory Visit: Payer: Self-pay | Admitting: Medical

## 2016-10-27 ENCOUNTER — Telehealth: Payer: Self-pay | Admitting: Medical

## 2016-10-27 ENCOUNTER — Emergency Department (HOSPITAL_BASED_OUTPATIENT_CLINIC_OR_DEPARTMENT_OTHER)
Admission: EM | Admit: 2016-10-27 | Discharge: 2016-10-27 | Disposition: A | Discharge status: Home / Self Care | Payer: BLUE CROSS/BLUE SHIELD | Attending: Emergency Medicine | Admitting: Emergency Medicine

## 2016-10-27 ENCOUNTER — Encounter (HOSPITAL_BASED_OUTPATIENT_CLINIC_OR_DEPARTMENT_OTHER): Payer: Self-pay | Admitting: *Deleted

## 2016-10-27 ENCOUNTER — Emergency Department (HOSPITAL_BASED_OUTPATIENT_CLINIC_OR_DEPARTMENT_OTHER): Payer: BLUE CROSS/BLUE SHIELD

## 2016-10-27 DIAGNOSIS — E1165 Type 2 diabetes mellitus with hyperglycemia: Secondary | ICD-10-CM | POA: Diagnosis not present

## 2016-10-27 DIAGNOSIS — R0789 Other chest pain: Secondary | ICD-10-CM

## 2016-10-27 DIAGNOSIS — J45909 Unspecified asthma, uncomplicated: Secondary | ICD-10-CM | POA: Diagnosis not present

## 2016-10-27 DIAGNOSIS — Z7984 Long term (current) use of oral hypoglycemic drugs: Secondary | ICD-10-CM | POA: Diagnosis not present

## 2016-10-27 DIAGNOSIS — R0602 Shortness of breath: Secondary | ICD-10-CM | POA: Diagnosis not present

## 2016-10-27 DIAGNOSIS — R079 Chest pain, unspecified: Secondary | ICD-10-CM | POA: Diagnosis not present

## 2016-10-27 DIAGNOSIS — Z87891 Personal history of nicotine dependence: Secondary | ICD-10-CM | POA: Diagnosis not present

## 2016-10-27 LAB — BASIC METABOLIC PANEL
ANION GAP: 9 (ref 5–15)
BUN: 7 mg/dL (ref 6–20)
CALCIUM: 9.5 mg/dL (ref 8.9–10.3)
CHLORIDE: 101 mmol/L (ref 101–111)
CO2: 26 mmol/L (ref 22–32)
CREATININE: 1.03 mg/dL (ref 0.61–1.24)
GFR calc Af Amer: >60 mL/min (ref >60)
GFR calc non Af Amer: >60 mL/min (ref >60)
Glucose, Bld: 354 mg/dL — ABNORMAL HIGH (ref 65–99)
POTASSIUM: 3.8 mmol/L (ref 3.5–5.1)
SODIUM: 136 mmol/L (ref 135–145)

## 2016-10-27 LAB — D-DIMER, QUANTITATIVE (NOT AT ARMC): D DIMER QUANT: 0.32 ug{FEU}/mL (ref 0.00–0.50)

## 2016-10-27 LAB — CBC
HCT: 46.8 % (ref 39.0–52.0)
Hemoglobin: 16 g/dL (ref 13.0–17.0)
MCH: 29.6 pg (ref 26.0–34.0)
MCHC: 34.2 g/dL (ref 30.0–36.0)
MCV: 86.5 fL (ref 78.0–100.0)
PLATELETS: 183 10*3/uL (ref 150–400)
RBC: 5.41 MIL/uL (ref 4.22–5.81)
RDW: 13.4 % (ref 11.5–15.5)
WBC: 6.5 10*3/uL (ref 4.0–10.5)

## 2016-10-27 LAB — TROPONIN I

## 2016-10-27 LAB — CBG MONITORING, ED: GLUCOSE-CAPILLARY: 254 mg/dL — AB (ref 65–99)

## 2016-10-27 MED ORDER — SODIUM CHLORIDE 0.9 % IV BOLUS (SEPSIS)
1000.0000 mL | Freq: Once | INTRAVENOUS | Status: AC
  Administered 2016-10-27: 1000 mL via INTRAVENOUS

## 2016-10-27 MED ORDER — METFORMIN HCL 500 MG PO TABS
1000.0000 mg | ORAL_TABLET | Freq: Once | ORAL | Status: AC
  Administered 2016-10-27: 1000 mg via ORAL
  Filled 2016-10-27: qty 2

## 2016-10-27 MED ORDER — INSULIN ASPART 100 UNIT/ML ~~LOC~~ SOLN
4.0000 [IU] | Freq: Once | SUBCUTANEOUS | Status: AC
  Administered 2016-10-27: 4 [IU] via SUBCUTANEOUS
  Filled 2016-10-27: qty 1

## 2016-10-27 NOTE — ED Provider Notes (Signed)
[facility] Provider Note   CSN: [id number] Arrival date & time: 10/27/16  1304     History   Chief Complaint Chief Complaint  Patient presents with  . Chest Pain    HPI Anthony Russell is a 36 y.o. male.  HPI   Patient with a history of 2 diabetes, asthma, anxiety presenting with chest pain. Patient presents after 5 days of the midsternal tingling. States that midsternal tingling has been constant. She denies any nausea, vomiting diaphoresis. He denies any radiation of the pain. He denies any worsening with activity or movement. Patient denies any fevers or chills lately. Does state he has a cough. Patient does state that he has felt short of breath today and that is why he came to be evaluated. Patient does have a history of reflux but has not been taking his Protonix regularly. Patient also has a history of diabetes has not been taking his metformin consistently. Denies fevers, chills. Denies any cold symptoms, congestion. He states that he does have intermittent asthma but is very mild and does not generally need an albuterol inhaler has not used in the past 6 months. Denies any wheezing. Patient denies any history of clots. States that he does not have any leg swelling nor has he been in any recent car rides.  Past Medical History:  Diagnosis Date  . Allergy   . Allergy, unspecified not elsewhere classified    rx w/ OTC antihistamines PRN  . Anxiety   . Asthma   . Atypical chest pain    recent neg eval w/ normal cxr/ekg  . Depression   . Dyspepsia    on protonix /d  . GERD (gastroesophageal reflux disease)    on protonix /d  . Hyperlipidemia    on diet alone  . Lumbar back pain    s/p lumbar laminectomy 2007 by DrCabbell  . Overweight(278.02)    weight Jan10=246#.[name].he was 225# in 9/07...diet and exercise was discussed    Patient Active Problem List   Diagnosis Date Noted  . Hypersomnia 11/03/2015  . Obesity 11/03/2015  . Acute bacterial  bronchitis 01/18/2015  . Allergic rhinitis 04/15/2014  . Asthma in adult 04/15/2014  . GERD (gastroesophageal reflux disease) 04/15/2014  . IBS (irritable bowel syndrome) 04/01/2013  . Nausea alone 01/15/2013  . Diarrhea 01/15/2013  . Abdominal cramping 01/15/2013  . Depression 10/15/2012  . Lesion of eyebrow 02/09/2012  . Reactive airways dysfunction syndrome (HCC) 07/05/2011  . Gastroparesis 07/05/2011  . Anxiety 07/05/2010  . TESTOSTERONE DEFICIENCY 05/26/2008  . FATIGUE 05/25/2008  . ABDOMINAL BLOATING 04/03/2008  . HYPERLIPIDEMIA 02/06/2008  . OVERWEIGHT 02/06/2008  . BACK PAIN, LUMBAR 02/06/2008  . CHEST PAIN, ATYPICAL 02/06/2008  . GERD 11/19/2007  . ALLERGY 11/18/2007    Past Surgical History:  Procedure Laterality Date  . LUMBAR LAMINECTOMY  2007   DrCabbell  . TEAR DUCT PROBING         Home Medications    Prior to Admission medications   Medication Sig Start Date End Date Taking? Authorizing Provider  Calcium-Magnesium-Zinc 500-250-12.5 MG TABS Take by mouth 3 (three) times daily.      [provider]  cetirizine (ZYRTEC ALLERGY) 10 MG tablet Take 10 mg by mouth daily as needed.     [provider]  fluticasone (FLONASE) 50 MCG/ACT nasal spray Place 2 sprays into both nostrils daily. 02/14/16   Saguier, Ramon Dredge, PA-C  metFORMIN (GLUCOPHAGE) 500 MG tablet TAKE 1 TABLET(500 MG) BY MOUTH TWICE DAILY  WITH A MEAL 10/16/16   Saguier, Ramon Dredge, PA-C  pantoprazole (PROTONIX) 40 MG tablet TAKE 1 TABLET BY MOUTH DAILY 10/25/16   Saguier, Ramon Dredge, PA-C    Family History Family History  Problem Relation Age of Onset  . Allergies Mother   . Irritable bowel syndrome Mother   . Allergies Father   . Allergies Brother   . Irritable bowel syndrome Brother   . Colon cancer Neg Hx     Social History Social History  Substance Use Topics  . Smoking status: Former Smoker    Packs/day: 0.10    Years: 8.00    Types: Cigarettes    Quit date: 03/03/2011  .  Smokeless tobacco: Never Used  . Alcohol use Yes     Comment: rare     Allergies   Patient has no known allergies.   Review of Systems Review of Systems  Constitutional: Negative for chills and fever.  HENT: Negative for congestion.   Respiratory: Positive for cough and shortness of breath. Negative for wheezing.   Cardiovascular: Positive for chest pain. Negative for leg swelling.  Gastrointestinal: Negative for nausea and vomiting.     Physical Exam Updated Vital Signs BP 116/79   Pulse 95   Temp 98.7 F (37.1 C)   Resp 20   Ht  (1.803 m)   Wt 117.9 kg (260 lb)   SpO2 96%   BMI 36.26 kg/m   Physical Exam  Constitutional: He appears well-developed and well-nourished.  HENT:  Head: Normocephalic and atraumatic.  Eyes: Conjunctivae are normal.  Neck: Neck supple.  Cardiovascular: Normal rate and regular rhythm.   No murmur heard. Pulmonary/Chest: Effort normal and breath sounds normal. No respiratory distress.  Abdominal: Soft. There is no tenderness.  Musculoskeletal: He exhibits no edema.  Neurological: He is alert.  Skin: Skin is warm and dry.  Psychiatric: He has a normal mood and affect.  Nursing note and vitals reviewed.    ED Treatments / Results  Labs (all labs ordered are listed, but only abnormal results are displayed) Labs Reviewed  BASIC METABOLIC PANEL - Abnormal; Notable for the following:       Result Value   Glucose, Bld 354 (*)    All other components within normal limits  CBG MONITORING, ED - Abnormal; Notable for the following:    Glucose-Capillary 254 (*)    All other components within normal limits  CBC  TROPONIN I  D-DIMER, QUANTITATIVE (NOT AT [facility])  TROPONIN I    EKG  EKG Interpretation  Date/Time:  Friday October 27 2016 13:14:34 EDT Ventricular Rate:  115 PR Interval:  168 QRS Duration: 96 QT Interval:  314 QTC Calculation: 434 R Axis:   77 Text Interpretation:  Sinus tachycardia Nonspecific T wave  abnormality Abnormal ECG No previous ECGs available Confirmed by Frederick Peers [id number]) on 10/27/2016 3:26:55 PM       Radiology Dg Chest 2 View  Result Date: 10/27/2016 CLINICAL DATA:  Chest pain, shortness of breath. EXAM: CHEST  2 VIEW COMPARISON:  Radiographs of October 15, 2012. FINDINGS: The heart size and mediastinal contours are within normal limits. Both lungs are clear. No pneumothorax or pleural effusion is noted. The visualized skeletal structures are unremarkable. IMPRESSION: No active cardiopulmonary disease. Electronically Signed   By: Lupita Raider, M.D.   On: 10/27/2016 13:53    Procedures Procedures (including critical care time)  Medications Ordered in ED Medications  sodium chloride 0.9 % bolus 1,000 mL (0 mLs Intravenous  Stopped 10/27/16 1646)  insulin aspart (novoLOG) injection 4 Units (4 Units Subcutaneous Given 10/27/16 1651)  metFORMIN (GLUCOPHAGE) tablet 1,000 mg (1,000 mg Oral Given 10/27/16 1654)     Initial Impression / Assessment and Plan / ED Course  I have reviewed the triage vital signs and the nursing notes.  Pertinent labs & imaging results that were available during my care of the patient were reviewed by me and considered in my medical decision making (see chart for details).   Patient presenting with atypical chest pain. Negative d-dimer to rule out PE. ACS ruled out with troponin 2 and EKG that was negative for ST elevation/depression. Patient without wheezing or any concerns for asthma exacerbation. Patient noted to have an elevated blood glucose. He is consistently taking his metformin. Willl provide him with 1 L bolus of fluid and 4 units of  Novolog. Repeat blood sugar 254 at discharge. Possibly patient's chest tingling due to neuropathy from elevated blood sugars. Patient to follow-up with PCP regarding better control of his blood sugars.    Final Clinical Impressions(s) / ED Diagnoses   Final diagnoses:  Atypical chest pain    New  Prescriptions New Prescriptions   No medications on file     Berton Bon, MD 10/27/16 1805    Little, Ambrose Finland, MD 10/28/16 [phone]

## 2016-10-27 NOTE — ED Notes (Signed)
ED Provider at bedside. 

## 2016-10-27 NOTE — Discharge Instructions (Addendum)
I want you to follow up with PCP as your blood sugars are elevated. You can take tylenol or ibuprofen for your chest pain, it is is possibly that the chest tingling you are feeling is related to your chest pain.

## 2016-10-27 NOTE — ED Triage Notes (Signed)
pt c/o mid sternal chest pain, SOB  x 3 days

## 2016-10-27 NOTE — Telephone Encounter (Signed)
Patient Name: MALEKAI MARKWOOD  DOB: 07-01-1980    Initial Comment Caller is having chest discomfort and pain.    Nurse Assessment  Nurse: Renaldo Fiddler, RN, Raynelle Fanning Date/Time Lamount Cohen Time): 10/27/2016 12:52:16 PM  Confirm and document reason for call. If symptomatic, describe symptoms. ---Caller states he has been having chest discomfort and pain since Monday. Today the pain is worse and he is having sob.  Does the patient have any new or worsening symptoms? ---Yes  Will a triage be completed? ---Yes  Related visit to physician within the last 2 weeks? ---No  Does the PT have any chronic conditions? (i.e. diabetes, asthma, etc.) ---Yes  List chronic conditions. ---Type 2 diabetes(?), GERD,  Is this a behavioral health or substance abuse call? ---No     Guidelines    Guideline Title Affirmed Question Affirmed Notes  Chest Pain [1] Chest pain lasts > 5 minutes AND [2] age > 30 AND [3] at least one cardiac risk factor (i.e., hypertension, diabetes, obesity, smoker or strong family history of heart disease)    Final Disposition User   Call EMS 911 Now Renaldo Fiddler, RN, Raynelle Fanning    Caller Disagree/Comply Comply  Caller Understands Yes  PreDisposition InappropriateToAsk   911 Outcome Documentation Renaldo Fiddler, RN, Raynelle Fanning Reason: Caller answered, he was talking to another person and hung up

## 2016-11-02 ENCOUNTER — Ambulatory Visit (INDEPENDENT_AMBULATORY_CARE_PROVIDER_SITE_OTHER): Payer: BLUE CROSS/BLUE SHIELD | Admitting: Medical

## 2016-11-02 ENCOUNTER — Encounter: Payer: Self-pay | Admitting: Medical

## 2016-11-02 VITALS — BP 122/76 | HR 100 | Temp 98.3°F | Resp 16 | Ht 70.0 in | Wt 250.0 lb

## 2016-11-02 DIAGNOSIS — R0789 Other chest pain: Secondary | ICD-10-CM | POA: Diagnosis not present

## 2016-11-02 DIAGNOSIS — E119 Type 2 diabetes mellitus without complications: Secondary | ICD-10-CM | POA: Diagnosis not present

## 2016-11-02 MED ORDER — METFORMIN HCL 1000 MG PO TABS: 1000.0000 mg | ORAL_TABLET | Freq: Two times a day (BID) | ORAL | 3 refills | Status: DC

## 2016-11-02 NOTE — Patient Instructions (Addendum)
For your diabetes with very high sugars in the emergency department, I want to increase your metformin to 1000 mg twice daily. I want you to try to eliminate the obvious high sugar foods/beverages such as the 2 sodas that you're currently drinking. Also would recommend not drinking the smoothies. I am going to refer for diabetic/nutrition education. I want you to call your insurance and ask them what glucometer they cover/prefer or if they have one that they give to diabetic patients. Please let us know which brand they prefer and we can send prescription to your pharmacy with supplies. I want you to check your blood sugars twice daily. In morning fasting and post meal as well. We'll see how your sugars are doing in 2 weeks. If with increasing metformin and change in diet your sugars are still elevated would then add additional medication.  Your chest pain workup was negative the other day. No reoccurrence. After discussion you declined cardiology referral but she you have any recurrent chest pain please let me know and I would put in a referral. If any chest pain that is constant then would recommend emergency department evaluation again.  I want you to come by tomorrow morning and get fasting lipid panel, metabolic panel and A1c.  Follow-up in 2 weeks or as needed.

## 2016-11-02 NOTE — Progress Notes (Signed)
Subjective:    Patient ID: Anthony Russell, male    DOB: August 02, 1980, 36 y.o.   MRN: [id number]  HPI  Pt in for evaluation after he had chest pain worked up in the ED. Troponin,ekg and D dimer were negative(except mild tachy). No chest pain since dc from ED. Pt states pain went away when he was in the ED.   No recurrent chest pain since in ED.   Pt states for about 5 days dull low level chest pain before he went to the ED. Then on Friday had faint mild shortness of breath. Had mild intermittent cough at that time but went away as wel.  On review I don't recent lipid panel. Dad has pacemaker. No hx of Mi. Brother had pericarditis bu no MI. Pt not a smoker. Quit 2014.   Pt sugars have been elevated. First diagnosed by Dr Drue Novel on lab review. Pt has had been drinking 6-8 cokes/dr. Pepper non diabetic. Now only drinking 2 a day. Also he was drinking smoothies every day last week.     Review of Systems  Constitutional: Negative for chills, fatigue and fever.  HENT: Negative for congestion, drooling, ear discharge, ear pain, hearing loss, nosebleeds and postnasal drip.   Respiratory: Negative for cough, chest tightness, shortness of breath and wheezing.   Cardiovascular: Negative for chest pain and palpitations.  Gastrointestinal: Negative for abdominal pain, anal bleeding, constipation, nausea and vomiting.  Musculoskeletal: Negative for arthralgias, back pain and neck pain.  Skin: Negative for rash.  Neurological: Negative for dizziness, speech difficulty, weakness, light-headedness and numbness.  Hematological: Negative for adenopathy. Does not bruise/bleed easily.  Psychiatric/Behavioral: Negative for agitation, confusion, dysphoric mood and suicidal ideas. The patient is not nervous/anxious.      Past Medical History:  Diagnosis Date  . Allergy   . Allergy, unspecified not elsewhere classified    rx w/ OTC antihistamines PRN  . Anxiety   . Asthma   . Atypical chest pain    recent  neg eval w/ normal cxr/ekg  . Depression   . Dyspepsia    on protonix /d  . GERD (gastroesophageal reflux disease)    on protonix /d  . Hyperlipidemia    on diet alone  . Lumbar back pain    s/p lumbar laminectomy 2007 by DrCabbell  . Overweight(278.02)    weight Jan10=246#.[name].he was 225# in 9/07...diet and exercise was discussed     Social History   Social History  . Marital status: Single    Spouse name: N/A  . Number of children: 0  . Years of education: N/A   Occupational History  . software     Social History Main Topics  . Smoking status: Former Smoker    Packs/day: 0.10    Years: 8.00    Types: Cigarettes    Quit date: 03/03/2011  . Smokeless tobacco: Never Used  . Alcohol use Yes     Comment: rare  . Drug use: No  . Sexual activity: Not on file   Other Topics Concern  . Not on file   Social History Narrative   3 cups caffeine a day    Past Surgical History:  Procedure Laterality Date  . LUMBAR LAMINECTOMY  2007   DrCabbell  . TEAR DUCT PROBING      Family History  Problem Relation Age of Onset  . Allergies Mother   . Irritable bowel syndrome Mother   . Allergies Father   . Allergies Brother   .  Irritable bowel syndrome Brother   . Colon cancer Neg Hx     No Known Allergies  Current Outpatient Prescriptions on File Prior to Visit  Medication Sig Dispense Refill  . Calcium-Magnesium-Zinc 500-250-12.5 MG TABS Take by mouth 3 (three) times daily.      . cetirizine (ZYRTEC ALLERGY) 10 MG tablet Take 10 mg by mouth daily as needed.     . fluticasone (FLONASE) 50 MCG/ACT nasal spray Place 2 sprays into both nostrils daily. 16 g 1  . metFORMIN (GLUCOPHAGE) 500 MG tablet TAKE 1 TABLET(500 MG) BY MOUTH TWICE DAILY WITH A MEAL 60 tablet 0  . pantoprazole (PROTONIX) 40 MG tablet TAKE 1 TABLET BY MOUTH DAILY 30 tablet 0   No current facility-administered medications on file prior to visit.     Pulse 100   Temp 98.3 F (36.8 C) (Oral)   Resp  16   Ht  (1.778 m)   Wt 250 lb (113.4 kg)   SpO2 99%   BMI 35.87 kg/m       Objective:   Physical Exam  General Mental Status- Alert. General Appearance- Not in acute distress.   Skin General: Color- Normal Color. Moisture- Normal Moisture.  Neck Carotid Arteries- Normal color. Moisture- Normal Moisture. No carotid bruits. No JVD.  Chest and Lung Exam Auscultation: Breath Sounds:-Normal.  Cardiovascular Auscultation:Rythm- Regular. Murmurs & Other Heart Sounds:Auscultation of the heart reveals- No Murmurs.  Abdomen Inspection:-Inspeection Normal. Palpation/Percussion:Note:No mass. Palpation and Percussion of the abdomen reveal- Non Tender, Non Distended + BS, no rebound or guarding.   Neurologic Cranial Nerve exam:- CN III-XII intact(No nystagmus), symmetric smile. Strength:- 5/5 equal and symmetric strength both upper and lower extremities.  Lower ext- see quality metrics.  Lower ext- no calf swelling and negative homans sign. No pedal edema.      Assessment & Plan:  For your diabetes with very high sugars in the emergency department, I want to increase your metformin to 1000 mg twice daily. I want you to try to eliminate the obvious high sugar foods/beverages such as the 2 sodas that you're currently drinking. Also would recommend not drinking the smoothies. I am going to refer for diabetic/nutrition education. I want you to call your insurance and ask them what glucometer they cover/prefer or if they have one that they give to diabetic patients. Please let us know which brand they prefer and we can send prescription to your pharmacy with supplies. I want you to check your blood sugars twice daily. In morning fasting and post meal as well. We'll see how your sugars are doing in 2 weeks. If with increasing metformin and change in diet your sugars are still elevated would then add additional medication.  Your chest pain workup was negative the other day. No  reoccurrence. After discussion you declined cardiology referral but she you have any recurrent chest pain please let me know and I would put in a referral. If any chest pain that is constant then would recommend emergency department evaluation again.  I want you to come by tomorrow morning and get fasting lipid panel, metabolic panel and A1c.  Follow-up in 2 weeks or as needed.  Mikesha Migliaccio, Ramon Dredge, PA-C

## 2016-11-03 ENCOUNTER — Telehealth: Payer: Self-pay | Admitting: Medical

## 2016-11-03 ENCOUNTER — Other Ambulatory Visit (INDEPENDENT_AMBULATORY_CARE_PROVIDER_SITE_OTHER): Payer: BLUE CROSS/BLUE SHIELD

## 2016-11-03 DIAGNOSIS — E119 Type 2 diabetes mellitus without complications: Secondary | ICD-10-CM | POA: Diagnosis not present

## 2016-11-03 DIAGNOSIS — R0789 Other chest pain: Secondary | ICD-10-CM | POA: Diagnosis not present

## 2016-11-03 LAB — COMPREHENSIVE METABOLIC PANEL
ALK PHOS: 61 U/L (ref 39–117)
ALT: 113 U/L — AB (ref 0–53)
AST: 47 U/L — ABNORMAL HIGH (ref 0–37)
Albumin: 4 g/dL (ref 3.5–5.2)
BILIRUBIN TOTAL: 0.4 mg/dL (ref 0.2–1.2)
BUN: 12 mg/dL (ref 6–23)
CALCIUM: 9.3 mg/dL (ref 8.4–10.5)
CO2: 28 mEq/L (ref 19–32)
Chloride: 103 mEq/L (ref 96–112)
Creatinine, Ser: 1.11 mg/dL (ref 0.40–1.50)
GFR: 79.59 mL/min (ref >60.00)
Glucose, Bld: 167 mg/dL — ABNORMAL HIGH (ref 70–99)
POTASSIUM: 3.6 meq/L (ref 3.5–5.1)
Sodium: 141 mEq/L (ref 135–145)
TOTAL PROTEIN: 7.1 g/dL (ref 6.0–8.3)

## 2016-11-03 LAB — LIPID PANEL
CHOLESTEROL: 154 mg/dL (ref 0–200)
HDL: 23.5 mg/dL — AB (ref >39.00)
NonHDL: 130.34
TRIGLYCERIDES: 234 mg/dL — AB (ref 0.0–149.0)
Total CHOL/HDL Ratio: 7
VLDL: 46.8 mg/dL — ABNORMAL HIGH (ref 0.0–40.0)

## 2016-11-03 LAB — HEMOGLOBIN A1C: HEMOGLOBIN A1C: 10.2 % — AB (ref 4.6–6.5)

## 2016-11-03 LAB — LDL CHOLESTEROL, DIRECT: Direct LDL: 100 mg/dL

## 2016-11-03 MED ORDER — CANAGLIFLOZIN 100 MG PO TABS: 100.0000 mg | ORAL_TABLET | Freq: Every day | ORAL | 2 refills | Status: DC

## 2016-11-03 NOTE — Telephone Encounter (Signed)
Rx of Invokana sent to the pharmacy

## 2016-11-03 NOTE — Telephone Encounter (Signed)
Pt would like a ONETOUGH meter kit like his mother got from [place] [facility]. Does pt need another appt for that or what does he need to do.

## 2016-11-06 NOTE — Telephone Encounter (Signed)
Left pt a message ONE TOUCH meter is at front desk ready for pick up

## 2016-11-16 ENCOUNTER — Ambulatory Visit (INDEPENDENT_AMBULATORY_CARE_PROVIDER_SITE_OTHER): Payer: BLUE CROSS/BLUE SHIELD | Admitting: Medical

## 2016-11-16 ENCOUNTER — Encounter: Payer: Self-pay | Admitting: Medical

## 2016-11-16 VITALS — BP 111/75 | HR 104 | Temp 98.9°F | Resp 16 | Ht 71.0 in | Wt 249.4 lb

## 2016-11-16 DIAGNOSIS — E119 Type 2 diabetes mellitus without complications: Secondary | ICD-10-CM

## 2016-11-16 DIAGNOSIS — F32A Depression, unspecified: Secondary | ICD-10-CM

## 2016-11-16 DIAGNOSIS — F329 Major depressive disorder, single episode, unspecified: Secondary | ICD-10-CM

## 2016-11-16 NOTE — Progress Notes (Signed)
Subjective:    Patient ID: Anthony Russell, male    DOB: [birth year], 36 y.o.   MRN: [id number]  HPI  Pt in for follow up. He is following low sugar diet. He states 2 meals more like ketogenic diet and one meal very minimal sugar. Invokana I wrote him was going to cost him $400. He is still on metformin 1000 mg twice a day. Sugars have been steady 100-150. Today sugar was 89. He states will plan to exercise but won't be high impact due to history of rt knee pain.(he has seen specialist for this pain). Knee better presently but does not want to hurt knee again. Presently doing some walking  Pt also has history of depression and anxiety. He tried Careers information officer. It helped with anxiety but did not help his mood. He stopped effexor in the past. Now reports some occasional depressed feeling. He states 3-4 days out week feels mild-moderate depression. Int the past he used wellbutrin but felt like it did not help him much. Paxil, and zoloft did not help much. He does not want to try meds presently but wants to try counseling.  Pt declines flu vaccine.  Review of Systems  Constitutional: Negative for chills and fatigue.  Respiratory: Negative for cough, chest tightness, shortness of breath and wheezing.   Cardiovascular: Negative for chest pain and palpitations.  Gastrointestinal: Negative for abdominal pain, constipation, nausea and vomiting.  Musculoskeletal: Negative for back pain, neck pain and neck stiffness.  Skin: Negative for rash.  Neurological: Negative for dizziness, seizures, speech difficulty, weakness, light-headedness and headaches.  Hematological: Negative for adenopathy. Does not bruise/bleed easily.  Psychiatric/Behavioral: Positive for dysphoric mood. Negative for behavioral problems, confusion, sleep disturbance and suicidal ideas. The patient is not nervous/anxious.     Past Medical History:  Diagnosis Date  . Allergy   . Allergy, unspecified not elsewhere classified    rx w/ OTC  antihistamines PRN  . Anxiety   . Asthma   . Atypical chest pain    recent neg eval w/ normal cxr/ekg  . Depression   . Dyspepsia    on protonix 40mg /d  . GERD (gastroesophageal reflux disease)    on protonix 40mg /d  . Hyperlipidemia    on diet alone  . Lumbar back pain    s/p lumbar laminectomy 2007 by DrCabbell  . Overweight(278.02)    weight Jan10=246#.[name].he was 225# in 9/07...diet and exercise was discussed     Social History   Social History  . Marital status: Single    Spouse name: N/A  . Number of children: 0  . Years of education: N/A   Occupational History  . software     Social History Main Topics  . Smoking status: Former Smoker    Packs/day: 0.10    Years: 8.00    Types: Cigarettes    Quit date: 03/03/2011  . Smokeless tobacco: Never Used  . Alcohol use Yes     Comment: rare  . Drug use: No  . Sexual activity: Not on file   Other Topics Concern  . Not on file   Social History Narrative   3 cups caffeine a day    Past Surgical History:  Procedure Laterality Date  . LUMBAR LAMINECTOMY  2007   DrCabbell  . TEAR DUCT PROBING      Family History  Problem Relation Age of Onset  . Allergies Mother   . Irritable bowel syndrome Mother   . Allergies Father   . Allergies  Brother   . Irritable bowel syndrome Brother   . Colon cancer Neg Hx     No Known Allergies  Current Outpatient Prescriptions on File Prior to Visit  Medication Sig Dispense Refill  . Calcium-Magnesium-Zinc 500-250-12.5 MG TABS Take by mouth 3 (three) times daily.      . cetirizine (ZYRTEC ALLERGY) 10 MG tablet Take 10 mg by mouth daily as needed.     . fluticasone (FLONASE) 50 MCG/ACT nasal spray Place 2 sprays into both nostrils daily. 16 g 1  . metFORMIN (GLUCOPHAGE) 1000 MG tablet Take 1 tablet (1,000 mg total) by mouth 2 (two) times daily with a meal. 60 tablet 3  . pantoprazole (PROTONIX) 40 MG tablet TAKE 1 TABLET BY MOUTH DAILY 30 tablet 0  . canagliflozin (INVOKANA) 100  MG TABS tablet Take 1 tablet (100 mg total) by mouth daily before breakfast. (Patient not taking: Reported on 11/16/2016) 30 tablet 2   No current facility-administered medications on file prior to visit.     BP 111/75   Pulse (!) 104   Temp 98.9 F (37.2 C) (Oral)   Resp 16   Ht 5\' 11"  (1.803 m)   Wt 249 lb 6.4 oz (113.1 kg)   SpO2 99%   BMI 34.78 kg/m       Objective:   Physical Exam  General Mental Status- Alert. General Appearance- Not in acute distress.   Skin General: Color- Normal Color. Moisture- Normal Moisture.  Neck Carotid Arteries- Normal color. Moisture- Normal Moisture. No carotid bruits. No JVD.  Chest and Lung Exam Auscultation: Breath Sounds:-Normal.  Cardiovascular Auscultation:Rythm- Regular. Murmurs & Other Heart Sounds:Auscultation of the heart reveals- No Murmurs.  Abdomen Inspection:-Inspeection Normal. Palpation/Percussion:Note:No mass. Palpation and Percussion of the abdomen reveal- Non Tender, Non Distended + BS, no rebound or guarding.  Neurologic Cranial Nerve exam:- CN III-XII intact(No nystagmus), symmetric smile. Strength:- 5/5 equal and symmetric strength both upper and lower extremities.      Assessment & Plan:  For diabetes continue strict diet, try to get more exercise and continue metformin. Also continue with diabetic diet counseling.  For depression I gave you number of our counseling service. Advise you ask to see Terri. We could try differeint med options if you choose or refer you to psychiatrist if you want as well. If any worsening depression please notify me.  Follow up February 05, 2017 or as needed  Esperanza RichtersSaguier, Jaydynn Wolford, [place]PA-C

## 2016-11-16 NOTE — Patient Instructions (Addendum)
For diabetes continue strict diet, try to get more exercise and continue metformin. Also continue with diabetic diet counseling. Continue to check sugars as you have.  For depression I gave you number of our counseling service. Advise you ask to see Terri. We could try differeint med options if you choose or refer you to psychiatrist if you want as well. If any worsening depression please notify me.  Follow up February 05, 2017 or as needed

## 2016-11-30 ENCOUNTER — Encounter: Payer: Self-pay | Admitting: Dietician

## 2016-11-30 ENCOUNTER — Encounter: Payer: BLUE CROSS/BLUE SHIELD | Attending: Medical | Admitting: Dietician

## 2016-11-30 DIAGNOSIS — E119 Type 2 diabetes mellitus without complications: Secondary | ICD-10-CM | POA: Insufficient documentation

## 2016-11-30 DIAGNOSIS — Z713 Dietary counseling and surveillance: Secondary | ICD-10-CM | POA: Insufficient documentation

## 2016-11-30 NOTE — Progress Notes (Signed)

## 2016-12-07 ENCOUNTER — Encounter: Payer: BLUE CROSS/BLUE SHIELD | Admitting: Dietician

## 2016-12-07 DIAGNOSIS — E119 Type 2 diabetes mellitus without complications: Secondary | ICD-10-CM

## 2016-12-07 DIAGNOSIS — Z713 Dietary counseling and surveillance: Secondary | ICD-10-CM | POA: Diagnosis not present

## 2016-12-07 NOTE — Progress Notes (Signed)
Patient was seen on 12/07/16 for the second of a series of three diabetes self-management courses at the [facility]. The following learning objectives were met by the patient during this class:   Describe the role of different macronutrients on glucose  Explain how carbohydrates affect blood glucose  State what foods contain the most carbohydrates  Demonstrate carbohydrate counting  Demonstrate how to read Nutrition Facts food label  Describe effects of various fats on heart health  Describe the importance of good nutrition for health and healthy eating strategies  Describe techniques for managing your shopping, cooking and meal planning  List strategies to follow meal plan when dining out  Describe the effects of alcohol on glucose and how to use it safely  Goals:  Follow Diabetes Meal Plan as instructed  Aim to spread carbs evenly throughout the day  Aim for 3 meals per day and snacks as needed Include lean protein foods to meals/snacks  Monitor glucose levels as instructed by your doctor   Follow-Up Plan:  Attend Core 3  Work towards following your personal food plan.   

## 2016-12-14 ENCOUNTER — Encounter: Payer: Self-pay | Admitting: Dietician

## 2016-12-14 ENCOUNTER — Encounter: Payer: BLUE CROSS/BLUE SHIELD | Admitting: Dietician

## 2016-12-14 DIAGNOSIS — E119 Type 2 diabetes mellitus without complications: Secondary | ICD-10-CM

## 2016-12-14 DIAGNOSIS — Z713 Dietary counseling and surveillance: Secondary | ICD-10-CM | POA: Diagnosis not present

## 2016-12-14 NOTE — Progress Notes (Signed)
Patient was seen on 12/14/16 for the third of a series of three diabetes self-management courses at the [facility].   Anthony Russell. State the amount of activity recommended for healthy living . Describe activities suitable for individual needs . Identify ways to regularly incorporate activity into daily life . Identify barriers to activity and ways to over come these barriers  Identify diabetes medications being personally used and their primary action for lowering glucose and possible side effects . Describe role of stress on blood glucose and develop strategies to address psychosocial issues . Identify diabetes complications and ways to prevent them  Explain how to manage diabetes during illness . Evaluate success in meeting personal goal . Establish 2-3 goals that they will plan to diligently work on until they return for the  7829-month follow-up visit  Goals:   I will count my carb choices at most meals and snacks  I will be active 30 minutes or more 3 times a week  I will take my diabetes medications as scheduled  Your patient has identified these potential barriers to change:  Motivation Stress  Your patient has identified their diabetes self-care support plan as  On-line Resources   Plan:  Attend Support Group as desired

## 2017-02-06 ENCOUNTER — Telehealth: Payer: Self-pay

## 2017-02-06 ENCOUNTER — Telehealth: Payer: Self-pay | Admitting: Medical

## 2017-02-06 DIAGNOSIS — Z794 Long term (current) use of insulin: Secondary | ICD-10-CM

## 2017-02-06 DIAGNOSIS — E118 Type 2 diabetes mellitus with unspecified complications: Secondary | ICD-10-CM

## 2017-02-06 NOTE — Telephone Encounter (Signed)
Copied from CRM [id number]. Topic: General - Other >> Feb 06, 2017  8:49 AM Jolayne Hainesaylor, Brittany L wrote: Pt is requesting to come in and get his A1C1 checked, advised him we did not have an active order. Please call patient when this can be done @ [phone]  Please advise.

## 2017-02-06 NOTE — Telephone Encounter (Signed)
If you would advise him on calling in advance and getting put on lab schedule couple of days in advance.

## 2017-02-06 NOTE — Telephone Encounter (Signed)
Future a1c, cmp and lipid panel placed. Will you call pt and explain needs to call and get placed on lab schedule.

## 2017-02-07 NOTE — Telephone Encounter (Signed)
Left pt a message making notifying him to call and schedule lb appointment.

## 2017-02-13 ENCOUNTER — Other Ambulatory Visit (INDEPENDENT_AMBULATORY_CARE_PROVIDER_SITE_OTHER): Payer: BLUE CROSS/BLUE SHIELD

## 2017-02-13 DIAGNOSIS — E118 Type 2 diabetes mellitus with unspecified complications: Secondary | ICD-10-CM | POA: Diagnosis not present

## 2017-02-13 DIAGNOSIS — Z794 Long term (current) use of insulin: Secondary | ICD-10-CM | POA: Diagnosis not present

## 2017-02-13 LAB — LIPID PANEL
Cholesterol: 173 mg/dL (ref 0–200)
HDL: 29.6 mg/dL — ABNORMAL LOW (ref >39.00)
LDL CALC: 113 mg/dL — AB (ref 0–99)
NonHDL: 143.47
Total CHOL/HDL Ratio: 6
Triglycerides: 153 mg/dL — ABNORMAL HIGH (ref 0.0–149.0)
VLDL: 30.6 mg/dL (ref 0.0–40.0)

## 2017-02-13 LAB — COMPREHENSIVE METABOLIC PANEL
ALK PHOS: 59 U/L (ref 39–117)
ALT: 66 U/L — ABNORMAL HIGH (ref 0–53)
AST: 31 U/L (ref 0–37)
Albumin: 4.1 g/dL (ref 3.5–5.2)
BUN: 11 mg/dL (ref 6–23)
CO2: 33 mEq/L — ABNORMAL HIGH (ref 19–32)
CREATININE: 1.03 mg/dL (ref 0.40–1.50)
Calcium: 9.3 mg/dL (ref 8.4–10.5)
Chloride: 102 mEq/L (ref 96–112)
GFR: 86.64 mL/min (ref >60.00)
GLUCOSE: 132 mg/dL — AB (ref 70–99)
POTASSIUM: 4 meq/L (ref 3.5–5.1)
SODIUM: 142 meq/L (ref 135–145)
Total Bilirubin: 0.4 mg/dL (ref 0.2–1.2)
Total Protein: 7.1 g/dL (ref 6.0–8.3)

## 2017-02-13 LAB — HEMOGLOBIN A1C: HEMOGLOBIN A1C: 6.5 % (ref 4.6–6.5)

## 2017-02-14 ENCOUNTER — Telehealth: Payer: Self-pay

## 2017-02-14 NOTE — Telephone Encounter (Signed)
Results given to patient, advised to continue same medication along with low sugar diet and exercise.

## 2017-02-14 NOTE — Telephone Encounter (Signed)
-----   Message from Esperanza RichtersEdward Saguier, PA-C sent at 02/13/2017  6:14 PM EST ----- Looks like your due for a visit within the next 2-4 weeks.  Typically see patients with diabetes every 3 months.  If you do schedule appointment tried to make a convenient appointment for yourself 8 and 9 AM or 1-2 pm could be less of a wait time.

## 2017-02-26 ENCOUNTER — Ambulatory Visit (HOSPITAL_BASED_OUTPATIENT_CLINIC_OR_DEPARTMENT_OTHER)
Admission: RE | Admit: 2017-02-26 | Discharge: 2017-02-26 | Disposition: A | Discharge status: Home / Self Care | Payer: BLUE CROSS/BLUE SHIELD | Source: Ambulatory Visit | Attending: Medical | Admitting: Medical

## 2017-02-26 ENCOUNTER — Ambulatory Visit (INDEPENDENT_AMBULATORY_CARE_PROVIDER_SITE_OTHER): Payer: BLUE CROSS/BLUE SHIELD | Admitting: Medical

## 2017-02-26 ENCOUNTER — Encounter: Payer: Self-pay | Admitting: Medical

## 2017-02-26 VITALS — BP 126/77 | HR 88 | Temp 98.3°F | Resp 16 | Ht 71.0 in | Wt 245.6 lb

## 2017-02-26 DIAGNOSIS — E119 Type 2 diabetes mellitus without complications: Secondary | ICD-10-CM

## 2017-02-26 DIAGNOSIS — M25571 Pain in right ankle and joints of right foot: Secondary | ICD-10-CM | POA: Insufficient documentation

## 2017-02-26 DIAGNOSIS — R937 Abnormal findings on diagnostic imaging of other parts of musculoskeletal system: Secondary | ICD-10-CM | POA: Diagnosis not present

## 2017-02-26 NOTE — Progress Notes (Signed)
Subjective:    Patient ID: Anthony Russell, male    DOB: 04-Aug-1980, 37 y.o.   MRN: [id number]  HPI  Pt in stating recent rt ankle pain lateral aspect. Pain for a few weeks. No injury or fall. Pt has been walking daily to exercise. (Goal 9,000-10,000 steps).  Pt has not used any ace wrap on ankle.  Pt a1c was 6.5 13 days.  Pt a1-c was 10.2 3 months ago. He went to diabetic classes, taking metformin and walking. He never use invokana since was too expensive.     Review of Systems  Constitutional: Negative for chills, fatigue and fever.  Respiratory: Negative for cough, chest tightness, shortness of breath and wheezing.   Cardiovascular: Negative for chest pain and palpitations.  Gastrointestinal: Negative for abdominal pain.  Musculoskeletal: Negative for back pain and neck pain.       Ankle pain. Rt side.  Skin: Negative for color change and rash.  Neurological: Negative for dizziness, weakness, numbness and headaches.  Hematological: Negative for adenopathy. Does not bruise/bleed easily.  Psychiatric/Behavioral: Negative for behavioral problems and confusion.   Past Medical History:  Diagnosis Date  . Allergy   . Allergy, unspecified not elsewhere classified    rx w/ OTC antihistamines PRN  . Anxiety   . Asthma   . Atypical chest pain    recent neg eval w/ normal cxr/ekg  . Depression   . Dyspepsia    on protonix 40mg /d  . GERD (gastroesophageal reflux disease)    on protonix 40mg /d  . Hyperlipidemia    on diet alone  . Lumbar back pain    s/p lumbar laminectomy 2007 by DrCabbell  . Overweight(278.02)    weight Jan10=246#.[name].he was 225# in 9/07...diet and exercise was discussed     Social History   Socioeconomic History  . Marital status: Single    Spouse name: Not on file  . Number of children: 0  . Years of education: Not on file  . Highest education level: Not on file  Social Needs  . Financial resource strain: Not on file  . Food insecurity - worry: Not on  file  . Food insecurity - inability: Not on file  . Transportation needs - medical: Not on file  . Transportation needs - non-medical: Not on file  Occupational History  . Occupation: software   Tobacco Use  . Smoking status: Former Smoker    Packs/day: 0.10    Years: 8.00    Pack years: 0.80    Types: Cigarettes    Last attempt to quit: 03/03/2011    Years since quitting: 5.9  . Smokeless tobacco: Never Used  Substance and Sexual Activity  . Alcohol use: Yes    Comment: rare  . Drug use: No  . Sexual activity: Not on file  Other Topics Concern  . Not on file  Social History Narrative   3 cups caffeine a day    Past Surgical History:  Procedure Laterality Date  . LUMBAR LAMINECTOMY  2007   DrCabbell  . TEAR DUCT PROBING      Family History  Problem Relation Age of Onset  . Allergies Mother   . Irritable bowel syndrome Mother   . Allergies Father   . Allergies Brother   . Irritable bowel syndrome Brother   . Colon cancer Neg Hx     No Known Allergies  Current Outpatient Medications on File Prior to Visit  Medication Sig Dispense Refill  . Calcium-Magnesium-Zinc 500-250-12.5 MG  TABS Take by mouth 3 (three) times daily.      . cetirizine (ZYRTEC ALLERGY) 10 MG tablet Take 10 mg by mouth daily as needed.     . fluticasone (FLONASE) 50 MCG/ACT nasal spray Place 2 sprays into both nostrils daily. 16 g 1  . metFORMIN (GLUCOPHAGE) 1000 MG tablet Take 1 tablet (1,000 mg total) by mouth 2 (two) times daily with a meal. 60 tablet 3  . pantoprazole (PROTONIX) 40 MG tablet TAKE 1 TABLET BY MOUTH DAILY 30 tablet 0  . canagliflozin (INVOKANA) 100 MG TABS tablet Take 1 tablet (100 mg total) by mouth daily before breakfast. (Patient not taking: Reported on 11/16/2016) 30 tablet 2   No current facility-administered medications on file prior to visit.     BP 126/77 (BP Location: Right Arm, Patient Position: Sitting, Cuff Size: Large)   Pulse 88   Temp 98.3 F (36.8 C) (Oral)    Resp 16   Ht 5\' 11"  (1.803 m)   Wt 245 lb 9.6 oz (111.4 kg)   SpO2 97%   BMI 34.25 kg/m       Objective:   Physical Exam  General Mental Status- Alert. General Appearance- Not in acute distress.   Skin General: Color- Normal Color. Moisture- Normal Moisture.  Neck Carotid Arteries- Normal color. Moisture- Normal Moisture. No carotid bruits. No JVD.  Chest and Lung Exam Auscultation: Breath Sounds:-Normal.  Cardiovascular Auscultation:Rythm- Regular. Murmurs & Other Heart Sounds:Auscultation of the heart reveals- No Murmurs.  Abdomen Inspection:-Inspeection Normal. Palpation/Percussion:Note:No mass. Palpation and Percussion of the abdomen reveal- Non Tender, Non Distended + BS, no rebound or guarding.   Neurologic Cranial Nerve exam:- CN III-XII intact(No nystagmus), symmetric smile. Strength:- 5/5 equal and symmetric strength both upper and lower extremities.  Rt ankle- mild tenderness talofibular ligament. No swelling.     Assessment & Plan:  For your right ankle pain this may represent ankle sprain related to increased exercise/walking.  We will go ahead and get x-ray of your ankle today and recommend using Ace wraps daily.  Also you can use low-dose ibuprofen 200-400 mg every 8 hours if needed.  Would recommend continuing to walk but only as tolerated.  If you walk instructed to experience pain recommend backing down.  If your pain persists for another 10-14 days then at that point would recommend sports medicine evaluation.  Good job on your decrease in blood sugar average.  You have done everything you should and have the results to prove it.  Continue healthy diet and metformin.  We will repeat your A1c in 3 months.  He has some mild triglyceride elevation and LDL elevation.  Very minimal and only recommend low-cholesterol diet and continue to exercise.  If numbers were to worsen then would advise low-dose statin.  Follow-up in 3 months or as needed.  Esperanza RichtersEdward  Graceland Wachter, PA-C

## 2017-02-26 NOTE — Patient Instructions (Addendum)
For your right ankle pain this may represent ankle sprain related to increased exercise/walking.  We will go ahead and get x-ray of your ankle today and recommend using Ace wraps daily.  Also you can use low-dose ibuprofen 200-400 mg every 8 hours if needed.  Would recommend continuing to walk but only as tolerated.  If you walk instructed to experience pain recommend backing down.  If your pain persists for another 10-14 days then at that point would recommend sports medicine evaluation.  Good job on your decrease in blood sugar average.  You have done everything you should and have the results to prove it.  Continue healthy diet and metformin.  We will repeat your A1c in 3 months.  He has some mild triglyceride elevation and LDL elevation.  Very minimal and only recommend low-cholesterol diet and continue to exercise.  If numbers were to worsen then would advise low-dose statin.  Follow-up in 3 months or as needed.

## 2017-02-27 ENCOUNTER — Encounter: Payer: Self-pay | Admitting: Medical

## 2017-03-12 ENCOUNTER — Encounter: Payer: Self-pay | Admitting: Medical

## 2017-03-12 ENCOUNTER — Ambulatory Visit (INDEPENDENT_AMBULATORY_CARE_PROVIDER_SITE_OTHER): Payer: BLUE CROSS/BLUE SHIELD | Admitting: Medical

## 2017-03-12 VITALS — BP 121/80 | HR 108 | Temp 99.2°F | Resp 16 | Ht 71.0 in | Wt 246.6 lb

## 2017-03-12 DIAGNOSIS — R05 Cough: Secondary | ICD-10-CM | POA: Diagnosis not present

## 2017-03-12 DIAGNOSIS — J029 Acute pharyngitis, unspecified: Secondary | ICD-10-CM | POA: Diagnosis not present

## 2017-03-12 DIAGNOSIS — R059 Cough, unspecified: Secondary | ICD-10-CM

## 2017-03-12 DIAGNOSIS — R52 Pain, unspecified: Secondary | ICD-10-CM

## 2017-03-12 LAB — POCT INFLUENZA A/B
Influenza A, POC: NEGATIVE
Influenza B, POC: NEGATIVE

## 2017-03-12 LAB — POCT RAPID STREP A (OFFICE): Rapid Strep A Screen: NEGATIVE

## 2017-03-12 MED ORDER — ONDANSETRON 8 MG PO TBDP: 8.0000 mg | ORAL_TABLET | Freq: Three times a day (TID) | ORAL | 0 refills | Status: DC | PRN

## 2017-03-12 MED ORDER — BENZONATATE 100 MG PO CAPS: 100.0000 mg | ORAL_CAPSULE | Freq: Three times a day (TID) | ORAL | 0 refills | Status: DC | PRN

## 2017-03-12 MED ORDER — AZITHROMYCIN 250 MG PO TABS: ORAL_TABLET | ORAL | 0 refills | Status: DC

## 2017-03-12 MED ORDER — OSELTAMIVIR PHOSPHATE 75 MG PO CAPS: 75.0000 mg | ORAL_CAPSULE | Freq: Two times a day (BID) | ORAL | 0 refills | Status: DC

## 2017-03-12 NOTE — Progress Notes (Signed)
Subjective:    Patient ID: Fletcher AnonSteven L Diles, male    DOB: 10/29/1980, 37 y.o.   MRN: [id number]  HPI   Pt in started with some st, body aches, fatigued and some subjective fever with chills. Pt mom has some body aches now. I saw her late last week. Just got message today that her body aches started and I suspect his mom may have flu.  He also has dry cough.   Review of Systems  Constitutional: Positive for chills, fatigue and fever.  HENT: Positive for sore throat. Negative for congestion, facial swelling, sinus pressure and sinus pain.        Sore throat moderate to severe.  Respiratory: Positive for cough. Negative for chest tightness, shortness of breath and wheezing.   Cardiovascular: Negative for chest pain and palpitations.  Gastrointestinal: Positive for abdominal pain and nausea. Negative for blood in stool, constipation and diarrhea.  Musculoskeletal: Positive for myalgias. Negative for back pain and gait problem.  Skin: Negative for rash.  Neurological: Negative for dizziness, facial asymmetry, light-headedness and headaches.  Hematological: Positive for adenopathy. Does not bruise/bleed easily.  Psychiatric/Behavioral: Negative for behavioral problems, confusion, hallucinations, sleep disturbance and suicidal ideas. The patient is not nervous/anxious.     Past Medical History:  Diagnosis Date  . Allergy   . Allergy, unspecified not elsewhere classified    rx w/ OTC antihistamines PRN  . Anxiety   . Asthma   . Atypical chest pain    recent neg eval w/ normal cxr/ekg  . Depression   . Dyspepsia    on protonix 40mg /d  . GERD (gastroesophageal reflux disease)    on protonix 40mg /d  . Hyperlipidemia    on diet alone  . Lumbar back pain    s/p lumbar laminectomy 2007 by DrCabbell  . Overweight(278.02)    weight Jan10=246#.[name]..he was 225# in 9/07...diet and exercise was discussed     Social History   Socioeconomic History  . Marital status: Single    Spouse name:  Not on file  . Number of children: 0  . Years of education: Not on file  . Highest education level: Not on file  Social Needs  . Financial resource strain: Not on file  . Food insecurity - worry: Not on file  . Food insecurity - inability: Not on file  . Transportation needs - medical: Not on file  . Transportation needs - non-medical: Not on file  Occupational History  . Occupation: software   Tobacco Use  . Smoking status: Former Smoker    Packs/day: 0.10    Years: 8.00    Pack years: 0.80    Types: Cigarettes    Last attempt to quit: 03/03/2011    Years since quitting: 6.0  . Smokeless tobacco: Never Used  Substance and Sexual Activity  . Alcohol use: Yes    Comment: rare  . Drug use: No  . Sexual activity: Not on file  Other Topics Concern  . Not on file  Social History Narrative   3 cups caffeine a day    Past Surgical History:  Procedure Laterality Date  . LUMBAR LAMINECTOMY  2007   DrCabbell  . TEAR DUCT PROBING      Family History  Problem Relation Age of Onset  . Allergies Mother   . Irritable bowel syndrome Mother   . Allergies Father   . Allergies Brother   . Irritable bowel syndrome Brother   . Colon cancer Neg Hx  No Known Allergies  Current Outpatient Medications on File Prior to Visit  Medication Sig Dispense Refill  . Calcium-Magnesium-Zinc 500-250-12.5 MG TABS Take by mouth 3 (three) times daily.      . canagliflozin (INVOKANA) 100 MG TABS tablet Take 1 tablet (100 mg total) by mouth daily before breakfast. (Patient not taking: Reported on 11/16/2016) 30 tablet 2  . cetirizine (ZYRTEC ALLERGY) 10 MG tablet Take 10 mg by mouth daily as needed.     . fluticasone (FLONASE) 50 MCG/ACT nasal spray Place 2 sprays into both nostrils daily. 16 g 1  . metFORMIN (GLUCOPHAGE) 1000 MG tablet Take 1 tablet (1,000 mg total) by mouth 2 (two) times daily with a meal. 60 tablet 3  . pantoprazole (PROTONIX) 40 MG tablet TAKE 1 TABLET BY MOUTH DAILY 30 tablet  0   No current facility-administered medications on file prior to visit.     BP 121/80   Pulse (!) 108   Temp 99.2 F (37.3 C) (Oral)   Resp 16   Ht 5\' 11"  (1.803 m)   Wt 246 lb 9.6 oz (111.9 kg)   SpO2 99%   BMI 34.39 kg/m       Objective:   Physical Exam  General  Mental Status - Alert. General Appearance - Well groomed. Not in acute distress.  Skin Rashes- No Rashes.  HEENT Head- Normal. Ear Auditory Canal - Left- Normal. Right - Normal.Tympanic Membrane- Left- Normal. Right- Normal. Eye Sclera/Conjunctiva- Left- Normal. Right- Normal. Nose & Sinuses Nasal Mucosa- Left-  Boggy and Congested. Right-  Boggy and  Congested.Bilateral maxillary and frontal sinus pressure. Mouth & Throat Lips: Upper Lip- Normal: no dryness, cracking, pallor, cyanosis, or vesicular eruption. Lower Lip-Normal: no dryness, cracking, pallor, cyanosis or vesicular eruption. Buccal Mucosa- Bilateral- No Aphthous ulcers. Oropharynx- No Discharge or Erythema. Tonsils: Characteristics- Bilateral-moderate erythema . Size/Enlargement- Bilateral- 2+ enlargement. Discharge- bilateral-None.  Neck Neck- Supple. No Masses.   Chest and Lung Exam Auscultation: Breath Sounds:-Clear even and unlabored.  Cardiovascular Auscultation:Rythm- Regular, rate and rhythm. Murmurs & Other Heart Sounds:Ausculatation of the heart reveal- No Murmurs.  Lymphatic Head & Neck General Head & Neck Lymphatics: Bilateral: Description-submandibular nodes bilaterally enlarged and mildly tender..      Assessment & Plan:  You have some symptoms that appear to be possibly strep related versus the flu.  On exam throat is moderately suspicious with bright red appearance and tonsils inflamed.  Your strep test result was negative but I am concerned this might be a false negative.  So I am going to prescribe you a azithromycin antibiotic.  In addition, I am going to prescribe you Tamiflu since I am aware of your close contact  with the person's might have the flu.  For nausea, I am making Zofran available.  For cough, I am making benzonatate available.   Follow-up in 7 days or as needed.

## 2017-03-12 NOTE — Patient Instructions (Signed)
You have some symptoms that appear to be possibly strep related versus the flu.  On exam throat is moderately suspicious with bright red appearance and tonsils inflamed.  Your strep test result was negative but I am concerned this might be a false negative.  So I am going to prescribe you a azithromycin antibiotic.  In addition, I am going to prescribe you Tamiflu since I am aware of your close contact with the person's might have the flu.  For nausea, I am making Zofran available.  For cough, I am making benzonatate available.   Follow-up in 7 days or as needed.

## 2017-05-09 ENCOUNTER — Ambulatory Visit: Payer: BLUE CROSS/BLUE SHIELD | Admitting: Medical

## 2017-05-09 ENCOUNTER — Ambulatory Visit (INDEPENDENT_AMBULATORY_CARE_PROVIDER_SITE_OTHER): Payer: BLUE CROSS/BLUE SHIELD | Admitting: Internal Medicine

## 2017-05-09 ENCOUNTER — Encounter: Payer: Self-pay | Admitting: Internal Medicine

## 2017-05-09 VITALS — BP 124/68 | HR 115 | Temp 98.3°F | Resp 14 | Ht 71.0 in | Wt 253.0 lb

## 2017-05-09 DIAGNOSIS — J029 Acute pharyngitis, unspecified: Secondary | ICD-10-CM

## 2017-05-09 DIAGNOSIS — J301 Allergic rhinitis due to pollen: Secondary | ICD-10-CM | POA: Diagnosis not present

## 2017-05-09 LAB — POCT RAPID STREP A (OFFICE): Rapid Strep A Screen: NEGATIVE

## 2017-05-09 MED ORDER — AZELASTINE HCL 0.1 % NA SOLN: 2.0000 | Freq: Two times a day (BID) | NASAL | 6 refills | Status: DC

## 2017-05-09 MED ORDER — PREDNISONE 10 MG PO TABS: ORAL_TABLET | ORAL | 0 refills | Status: DC

## 2017-05-09 NOTE — Progress Notes (Signed)
Subjective:    Patient ID: Anthony Russell, male    DOB: [birth year], 37 y.o.   MRN: [id number]  DOS:  05/09/2017 Type of visit - description : Acute visit Interval history: 1 week history of sinus congestion, sore throat, cough, + sputum and nasal mucus production, off color. He has taken Zyrtec and Claritin-D without much help.  Also uses  Mucinex. Symptoms coincide with increasing pollen in the environment and resemble previous episode of allergies.   Review of Systems  Denies fever chills No nausea or vomiting, did have one episode of diarrhea No myalgias + Itchy eyes and sneezing  Past Medical History:  Diagnosis Date  . Allergy   . Allergy, unspecified not elsewhere classified    rx w/ OTC antihistamines PRN  . Anxiety   . Asthma   . Atypical chest pain    recent neg eval w/ normal cxr/ekg  . Depression   . Dyspepsia    on protonix /d  . GERD (gastroesophageal reflux disease)    on protonix /d  . Hyperlipidemia    on diet alone  . Lumbar back pain    s/p lumbar laminectomy 2007 by DrCabbell  . Overweight(278.02)    weight Jan10=246#.[name].he was 225# in 9/07...diet and exercise was discussed    Past Surgical History:  Procedure Laterality Date  . LUMBAR LAMINECTOMY  2007   DrCabbell  . TEAR DUCT PROBING      Social History   Socioeconomic History  . Marital status: Single    Spouse name: Not on file  . Number of children: 0  . Years of education: Not on file  . Highest education level: Not on file  Occupational History  . Occupation: Location manager Needs  . Financial resource strain: Not on file  . Food insecurity:    Worry: Not on file    Inability: Not on file  . Transportation needs:    Medical: Not on file    Non-medical: Not on file  Tobacco Use  . Smoking status: Former Smoker    Packs/day: 0.10    Years: 8.00    Pack years: 0.80    Types: Cigarettes    Last attempt to quit: 03/03/2011    Years since quitting: 6.1  . Smokeless  tobacco: Never Used  Substance and Sexual Activity  . Alcohol use: Yes    Comment: rare  . Drug use: No  . Sexual activity: Not on file  Lifestyle  . Physical activity:    Days per week: Not on file    Minutes per session: Not on file  . Stress: Not on file  Relationships  . Social connections:    Talks on phone: Not on file    Gets together: Not on file    Attends religious service: Not on file    Active member of club or organization: Not on file    Attends meetings of clubs or organizations: Not on file    Relationship status: Not on file  . Intimate partner violence:    Fear of current or ex partner: Not on file    Emotionally abused: Not on file    Physically abused: Not on file    Forced sexual activity: Not on file  Other Topics Concern  . Not on file  Social History Narrative   3 cups caffeine a day      Allergies as of 05/09/2017   No Known Allergies     Medication List  Accurate as of 05/09/17 11:59 PM. Always use your most recent med list.          azelastine 0.1 % nasal spray Commonly known as:  ASTELIN Place 2 sprays into both nostrils 2 (two) times daily.   Calcium-Magnesium-Zinc 500-250-12.5 MG Tabs Take by mouth 3 (three) times daily.   fluticasone 50 MCG/ACT nasal spray Commonly known as:  FLONASE Place 2 sprays into both nostrils daily.   loratadine 10 MG tablet Commonly known as:  CLARITIN Take 10 mg by mouth daily as needed for allergies.   metFORMIN 1000 MG tablet Commonly known as:  GLUCOPHAGE Take 1 tablet (1,000 mg total) by mouth 2 (two) times daily with a meal.   pantoprazole 40 MG tablet Commonly known as:  PROTONIX TAKE 1 TABLET BY MOUTH DAILY   predniSONE 10 MG tablet Commonly known as:  DELTASONE 3 tabs x 2 days, 2 tabs x 2 days, 1 tab x 2 days   ZICAM ALLERGY RELIEF Gel Place into the nose.   ZYRTEC ALLERGY 10 MG tablet Generic drug:  cetirizine Take 10 mg by mouth daily as needed.          Objective:     Physical Exam BP 124/68 (BP Location: Left Arm, Patient Position: Sitting, Cuff Size: Normal)   Pulse (!) 115   Temp 98.3 F (36.8 C) (Oral)   Resp 14   Ht  (1.803 m)   Wt 253 lb (114.8 kg)   SpO2 97%   BMI 35.29 kg/m  General:   Well developed, well nourished . NAD.  HEENT:  Normocephalic . Face symmetric, atraumatic. Right TM: Obscured by wax.  Left TM normal. Throat: Symmetric, mildly red, no discharge. Nose is slightly congested. Lungs:  CTA B Normal respiratory effort, no intercostal retractions, no accessory muscle use. Heart: RRR,  no murmur.  No pretibial edema bilaterally  Skin: Not pale. Not jaundice Neurologic:  alert & oriented X3.  Speech normal, gait appropriate for age and unassisted Psych--  Cognition and judgment appear intact.  Cooperative with normal attention span and concentration.  Behavior appropriate. No anxious or depressed appearing.      Assessment & Plan:    37 year old gentleman with PMH that includes DM, obesity, allergies, presents with  Allergies: Strep test was negative, symptoms likely due to allergies. Recommend consistent use of one antihistaminic a day either Zyrtec, Claritin, Allegra.  Do not mix them.  Avoid decongestants long-term. Also recommend Flonase, Astelin. Avoidance of outdoors Prednisone for a few days, stop if CBGs more than 200. Call if not better.

## 2017-05-09 NOTE — Patient Instructions (Signed)
Use  one antihistaminic daily: Either Zyrtec, Claritin, Allegra or similar.  Do not mix them.  Flonase daily  Astelin, and other nose spray twice a day  Avoid long-term use of decongestants such as   Pseudoephedrine or phenylephrine   Prednisone for a few days, might increase your blood sugar, check your sugars daily, stop prednisone if they are more than 200.  Avoid outdoors when possible, call if no better

## 2017-05-09 NOTE — Progress Notes (Signed)
Pre visit review using our clinic review tool, if applicable. No additional management support is needed unless otherwise documented below in the visit note. 

## 2017-06-23 DIAGNOSIS — H02844 Edema of left upper eyelid: Secondary | ICD-10-CM | POA: Diagnosis not present

## 2017-06-23 DIAGNOSIS — T63481A Toxic effect of venom of other arthropod, accidental (unintentional), initial encounter: Secondary | ICD-10-CM | POA: Diagnosis not present

## 2017-07-03 ENCOUNTER — Other Ambulatory Visit: Payer: Self-pay | Admitting: Medical

## 2017-07-30 ENCOUNTER — Other Ambulatory Visit: Payer: Self-pay | Admitting: Medical

## 2017-09-10 ENCOUNTER — Other Ambulatory Visit: Payer: Self-pay | Admitting: Medical

## 2017-11-21 ENCOUNTER — Other Ambulatory Visit: Payer: Self-pay | Admitting: Medical

## 2017-12-19 ENCOUNTER — Other Ambulatory Visit: Payer: Self-pay | Admitting: Medical

## 2018-01-28 DIAGNOSIS — J209 Acute bronchitis, unspecified: Secondary | ICD-10-CM | POA: Diagnosis not present

## 2018-01-28 DIAGNOSIS — R0982 Postnasal drip: Secondary | ICD-10-CM | POA: Diagnosis not present

## 2018-02-11 ENCOUNTER — Other Ambulatory Visit: Payer: Self-pay | Admitting: Medical

## 2018-03-15 ENCOUNTER — Ambulatory Visit: Payer: Self-pay

## 2018-03-15 NOTE — Telephone Encounter (Signed)
Patient called and says that he's been feeling tired, head swimmy since Wednesday. He says he's not dizzy or lightheaded, but his head is so that he's not able to concentrate. He says that he was out of work due to feeling so tired and not able to concentrate. He denies any other symptoms. According to protocol, see PCP within 24 hours, no availability today with PCP or any provider in the practice this afternoon, advised about going to the [facility], patient agrees, appointment scheduled for tomorrow at 0900 with Leanora Cover, FNP at [facility], care advice given, patient verbalized understanding.  Reason for Disposition . [1] MODERATE weakness (i.e., interferes with work, school, normal activities) AND [2] persists > 3 days  Answer Assessment - Initial Assessment Questions 1. DESCRIPTION: "Describe how you are feeling."     Tired, head feels swimmy (not able to concentrate) 2. SEVERITY: "How bad is it?"  "Can you stand and walk?"   - MILD - Feels weak or tired, but does not interfere with work, school or normal activities   - MODERATE - Able to stand and walk; weakness interferes with work, school, or normal activities   - SEVERE - Unable to stand or walk     Moderate 3. ONSET:  "When did the weakness begin?"     Wednesday 4. CAUSE: "What do you think is causing the weakness?"     I don't know 5. MEDICINES: "Have you recently started a new medicine or had a change in the amount of a medicine?"     No 6. OTHER SYMPTOMS: "Do you have any other symptoms?" (e.g., chest pain, fever, cough, SOB, vomiting, diarrhea, bleeding, other areas of pain)     No 7. PREGNANCY: "Is there any chance you are pregnant?" "When was your last menstrual period?"     N/A  Protocols used: WEAKNESS (GENERALIZED) AND FATIGUE-A-AH

## 2018-03-16 ENCOUNTER — Encounter: Payer: Self-pay | Admitting: Family Medicine

## 2018-03-16 ENCOUNTER — Ambulatory Visit (INDEPENDENT_AMBULATORY_CARE_PROVIDER_SITE_OTHER): Payer: BLUE CROSS/BLUE SHIELD | Admitting: Family Medicine

## 2018-03-16 VITALS — BP 120/82 | HR 104 | Temp 98.8°F | Resp 16 | Ht 71.0 in | Wt 252.0 lb

## 2018-03-16 DIAGNOSIS — J00 Acute nasopharyngitis [common cold]: Secondary | ICD-10-CM | POA: Diagnosis not present

## 2018-03-16 DIAGNOSIS — H6593 Unspecified nonsuppurative otitis media, bilateral: Secondary | ICD-10-CM | POA: Diagnosis not present

## 2018-03-16 DIAGNOSIS — R0981 Nasal congestion: Secondary | ICD-10-CM

## 2018-03-16 MED ORDER — FLUTICASONE PROPIONATE 50 MCG/ACT NA SUSP: 2.0000 | Freq: Every day | NASAL | 0 refills | Status: DC

## 2018-03-16 NOTE — Progress Notes (Signed)
Subjective:    Patient ID: Anthony Russell, male    DOB: 03/11/1980, 38 y.o.   MRN: [id number]  HPI   Patient presents to clinic complaining of feeling fatigued, having a swimmy and fullness feeling in ears and head and some nasal congestion for the past 4 days.  Also states the swimming feeling in head has been making it difficult for him to concentrate at work.  Denies fever or chills.  Denies body aches.  Denies cough, shortness of breath or wheezing.  Denies chest pain.  Denies nausea/vomiting or diarrhea.  Patient states his roommate has similar symptoms of feeling fatigued and fullness in the ears/had and some runny nose.  Patient Active Problem List   Diagnosis Date Noted  . Hypersomnia 11/03/2015  . Obesity 11/03/2015  . Acute bacterial bronchitis 01/18/2015  . Allergic rhinitis 04/15/2014  . Asthma in adult 04/15/2014  . GERD (gastroesophageal reflux disease) 04/15/2014  . IBS (irritable bowel syndrome) 04/01/2013  . Nausea alone 01/15/2013  . Diarrhea 01/15/2013  . Abdominal cramping 01/15/2013  . Depression 10/15/2012  . Lesion of eyebrow 02/09/2012  . Reactive airways dysfunction syndrome (HCC) 07/05/2011  . Gastroparesis 07/05/2011  . Anxiety 07/05/2010  . TESTOSTERONE DEFICIENCY 05/26/2008  . FATIGUE 05/25/2008  . ABDOMINAL BLOATING 04/03/2008  . HYPERLIPIDEMIA 02/06/2008  . OVERWEIGHT 02/06/2008  . BACK PAIN, LUMBAR 02/06/2008  . CHEST PAIN, ATYPICAL 02/06/2008  . GERD 11/19/2007  . ALLERGY 11/18/2007   Social History   Tobacco Use  . Smoking status: Former Smoker    Packs/day: 0.10    Years: 8.00    Pack years: 0.80    Types: Cigarettes    Last attempt to quit: 03/03/2011    Years since quitting: 7.0  . Smokeless tobacco: Never Used  Substance Use Topics  . Alcohol use: Yes    Comment: rare   Review of Systems   Constitutional: Negative for chills, and fever. +fatigue HENT: Mild nasal congestion, "swimmy feeling" in ears and head. No sore throat    Eyes: Negative.   Respiratory: Negative for cough, shortness of breath and wheezing.   Cardiovascular: Negative for chest pain, palpitations and leg swelling.  Gastrointestinal: Negative for abdominal pain, diarrhea, nausea and vomiting.  Genitourinary: Negative for dysuria, frequency and urgency.  Musculoskeletal: Negative for arthralgias and myalgias.  Skin: Negative for color change, pallor and rash.  Neurological: Negative for syncope, light-headedness and headaches.  Psychiatric/Behavioral: The patient is not nervous/anxious.    Objective:   Physical Exam Vitals signs and nursing note reviewed.  Constitutional:      General: He is not in acute distress.    Appearance: He is not ill-appearing, toxic-appearing or diaphoretic.  HENT:     Head: Normocephalic and atraumatic.     Right Ear: A middle ear effusion is present. Tympanic membrane is not erythematous or bulging.     Left Ear: A middle ear effusion is present. Tympanic membrane is not erythematous or bulging.     Ears:     Comments: TMs pink and full bilat    Nose: Congestion and rhinorrhea (clear drainage) present.     Mouth/Throat:     Mouth: Mucous membranes are moist.  Eyes:     General: No scleral icterus.       Right eye: No discharge.        Left eye: No discharge.     Extraocular Movements: Extraocular movements intact.     Conjunctiva/sclera: Conjunctivae normal.  Neck:  Musculoskeletal: Normal range of motion and neck supple.  Cardiovascular:     Rate and Rhythm: Normal rate and regular rhythm.  Pulmonary:     Effort: Pulmonary effort is normal. No respiratory distress.     Breath sounds: Normal breath sounds. No wheezing, rhonchi or rales.  Lymphadenopathy:     Cervical: No cervical adenopathy.  Skin:    General: Skin is warm and dry.     Coloration: Skin is not jaundiced or pale.  Neurological:     Mental Status: He is alert and oriented to person, place, and time.  Psychiatric:        Mood and  Affect: Mood normal.        Behavior: Behavior normal.     Vitals:   03/16/18 0855  BP: 120/82  Pulse: (!) 104  Resp: 16  Temp: 98.8 F (37.1 C)  SpO2: 96%      Assessment & Plan:   Middle ear effusion, nasal congestion, cold virus - suspect patient's symptoms are all related to a common cold virus and or allergic rhinitis.  Patient advised to take Zyrtec daily to help dry up congestion and use Flonase nasal spray to help reduce middle ear effusion.  Also advised to rest he will good fluid intake and do good handwashing.   Encouraged patient to make a follow-up with his primary care as he has not seen his PCP since January 2019.  Patient advised to call office or return to clinic if his symptoms persist or worsen.

## 2018-03-16 NOTE — Patient Instructions (Signed)
Take zyrtec  Use flonase spray to help reduce congestion, fullness in ears  Make appt with PCP for regular follow up and lab work    Eustachian Tube Dysfunction  Eustachian tube dysfunction refers to a condition in which a blockage develops in the narrow passage that connects the middle ear to the back of the nose (eustachian tube). The eustachian tube regulates air pressure in the middle ear by letting air move between the ear and nose. It also helps to drain fluid from the middle ear space. Eustachian tube dysfunction can affect one or both ears. When the eustachian tube does not function properly, air pressure, fluid, or both can build up in the middle ear. What are the causes? This condition occurs when the eustachian tube becomes blocked or cannot open normally. Common causes of this condition include:  Ear infections.  Colds and other infections that affect the nose, mouth, and throat (upper respiratory tract).  Allergies.  Irritation from cigarette smoke.  Irritation from stomach acid coming up into the esophagus (gastroesophageal reflux). The esophagus is the tube that carries food from the mouth to the stomach.  Sudden changes in air pressure, such as from descending in an airplane or scuba diving.  Abnormal growths in the nose or throat, such as: ? Growths that line the nose (nasal polyps). ? Abnormal growth of cells (tumors). ? Enlarged tissue at the back of the throat (adenoids). What increases the risk? You are more likely to develop this condition if:  You smoke.  You are overweight.  You are a child who has: ? Certain birth defects of the mouth, such as cleft palate. ? Large tonsils or adenoids. What are the signs or symptoms? Common symptoms of this condition include:  A feeling of fullness in the ear.  Ear pain.  Clicking or popping noises in the ear.  Ringing in the ear.  Hearing loss.  Loss of balance.  Dizziness. Symptoms may get worse when  the air pressure around you changes, such as when you travel to an area of high elevation, fly on an airplane, or go scuba diving. How is this diagnosed? This condition may be diagnosed based on:  Your symptoms.  A physical exam of your ears, nose, and throat.  Tests, such as those that measure: ? The movement of your eardrum (tympanogram). ? Your hearing (audiometry). How is this treated? Treatment depends on the cause and severity of your condition.  In mild cases, you may relieve your symptoms by moving air into your ears. This is called "popping the ears."  In more severe cases, or if you have symptoms of fluid in your ears, treatment may include: ? Medicines to relieve congestion (decongestants). ? Medicines that treat allergies (antihistamines). ? Nasal sprays or ear drops that contain medicines that reduce swelling (steroids). ? A procedure to drain the fluid in your eardrum (myringotomy). In this procedure, a small tube is placed in the eardrum to:  Drain the fluid.  Restore the air in the middle ear space. ? A procedure to insert a balloon device through the nose to inflate the opening of the eustachian tube (balloon dilation). Follow these instructions at home: Lifestyle  Do not do any of the following until your health care provider approves: ? Travel to high altitudes. ? Fly in airplanes. ? Work in a Estate agent or room. ? Scuba dive.  Do not use any products that contain nicotine or tobacco, such as cigarettes and e-cigarettes. If you need help  quitting, ask your health care provider.  Keep your ears dry. Wear fitted earplugs during showering and bathing. Dry your ears completely after. General instructions  Take over-the-counter and prescription medicines only as told by your health care provider.  Use techniques to help pop your ears as recommended by your health care provider. These may include: ? Chewing gum. ? Yawning. ? Frequent, forceful  swallowing. ? Closing your mouth, holding your nose closed, and gently blowing as if you are trying to blow air out of your nose.  Keep all follow-up visits as told by your health care provider. This is important. Contact a health care provider if:  Your symptoms do not go away after treatment.  Your symptoms come back after treatment.  You are unable to pop your ears.  You have: ? A fever. ? Pain in your ear. ? Pain in your head or neck. ? Fluid draining from your ear.  Your hearing suddenly changes.  You become very dizzy.  You lose your balance. Summary  Eustachian tube dysfunction refers to a condition in which a blockage develops in the eustachian tube.  It can be caused by ear infections, allergies, inhaled irritants, or abnormal growths in the nose or throat.  Symptoms include ear pain, hearing loss, or ringing in the ears.  Mild cases are treated with maneuvers to unblock the ears, such as yawning or ear popping.  Severe cases are treated with medicines. Surgery may also be done (rare). This information is not intended to replace advice given to you by your health care provider. Make sure you discuss any questions you have with your health care provider. Document Released: 02/12/2015 Document Revised: 05/08/2017 Document Reviewed: 05/08/2017 Elsevier Interactive Patient Education  2019 [employer].

## 2018-03-29 ENCOUNTER — Emergency Department (HOSPITAL_BASED_OUTPATIENT_CLINIC_OR_DEPARTMENT_OTHER)
Admission: EM | Admit: 2018-03-29 | Discharge: 2018-03-29 | Disposition: A | Discharge status: Home / Self Care | Payer: BLUE CROSS/BLUE SHIELD | Attending: Emergency Medicine | Admitting: Emergency Medicine

## 2018-03-29 ENCOUNTER — Ambulatory Visit (INDEPENDENT_AMBULATORY_CARE_PROVIDER_SITE_OTHER): Payer: BLUE CROSS/BLUE SHIELD | Admitting: Medical

## 2018-03-29 ENCOUNTER — Other Ambulatory Visit: Payer: Self-pay

## 2018-03-29 ENCOUNTER — Emergency Department (HOSPITAL_BASED_OUTPATIENT_CLINIC_OR_DEPARTMENT_OTHER): Payer: BLUE CROSS/BLUE SHIELD

## 2018-03-29 ENCOUNTER — Encounter: Payer: Self-pay | Admitting: Medical

## 2018-03-29 ENCOUNTER — Encounter (HOSPITAL_BASED_OUTPATIENT_CLINIC_OR_DEPARTMENT_OTHER): Payer: Self-pay

## 2018-03-29 VITALS — BP 114/72 | HR 93 | Temp 98.6°F | Resp 16 | Ht 71.0 in | Wt 256.2 lb

## 2018-03-29 DIAGNOSIS — Z79899 Other long term (current) drug therapy: Secondary | ICD-10-CM | POA: Diagnosis not present

## 2018-03-29 DIAGNOSIS — E119 Type 2 diabetes mellitus without complications: Secondary | ICD-10-CM | POA: Diagnosis not present

## 2018-03-29 DIAGNOSIS — R0789 Other chest pain: Secondary | ICD-10-CM

## 2018-03-29 DIAGNOSIS — Z7984 Long term (current) use of oral hypoglycemic drugs: Secondary | ICD-10-CM | POA: Diagnosis not present

## 2018-03-29 DIAGNOSIS — E118 Type 2 diabetes mellitus with unspecified complications: Secondary | ICD-10-CM | POA: Diagnosis not present

## 2018-03-29 DIAGNOSIS — E785 Hyperlipidemia, unspecified: Secondary | ICD-10-CM

## 2018-03-29 DIAGNOSIS — Z87891 Personal history of nicotine dependence: Secondary | ICD-10-CM | POA: Diagnosis not present

## 2018-03-29 DIAGNOSIS — J45909 Unspecified asthma, uncomplicated: Secondary | ICD-10-CM | POA: Insufficient documentation

## 2018-03-29 LAB — CBC
HCT: 49.2 % (ref 39.0–52.0)
Hemoglobin: 16 g/dL (ref 13.0–17.0)
MCH: 29.7 pg (ref 26.0–34.0)
MCHC: 32.5 g/dL (ref 30.0–36.0)
MCV: 91.4 fL (ref 80.0–100.0)
Platelets: 181 10*3/uL (ref 150–400)
RBC: 5.38 MIL/uL (ref 4.22–5.81)
RDW: 13 % (ref 11.5–15.5)
WBC: 6.8 10*3/uL (ref 4.0–10.5)
nRBC: 0 % (ref 0.0–0.2)

## 2018-03-29 LAB — BASIC METABOLIC PANEL
ANION GAP: 7 (ref 5–15)
BUN: 10 mg/dL (ref 6–20)
CO2: 28 mmol/L (ref 22–32)
Calcium: 9.5 mg/dL (ref 8.9–10.3)
Chloride: 101 mmol/L (ref 98–111)
Creatinine, Ser: 0.94 mg/dL (ref 0.61–1.24)
GFR calc Af Amer: >60 mL/min (ref >60)
GFR calc non Af Amer: >60 mL/min (ref >60)
Glucose, Bld: 257 mg/dL — ABNORMAL HIGH (ref 70–99)
POTASSIUM: 3.6 mmol/L (ref 3.5–5.1)
Sodium: 136 mmol/L (ref 135–145)

## 2018-03-29 LAB — TROPONIN I

## 2018-03-29 MED ORDER — ASPIRIN 81 MG PO CHEW
324.0000 mg | CHEWABLE_TABLET | Freq: Once | ORAL | Status: AC
  Administered 2018-03-29: 324 mg via ORAL
  Filled 2018-03-29: qty 4

## 2018-03-29 NOTE — Discharge Instructions (Addendum)
Follow-up with your primary care provider.  Return to ER for new or worsening symptoms. 

## 2018-03-29 NOTE — ED Provider Notes (Signed)
[facility] EMERGENCY DEPARTMENT Provider Note   CSN: [id number] Arrival date & time: 03/29/18  1650    History   Chief Complaint Chief Complaint  Patient presents with  . Chest Pain    HPI STOCKTON PULLIAN is a 38 y.o. male.  HPI: A 38 year old patient with a history of treated diabetes, hypercholesterolemia and obesity presents for evaluation of chest pain. Initial onset of pain was more than 6 hours ago. The patient's chest pain is described as heaviness/pressure/tightness and is not worse with exertion. The patient's chest pain is middle- or left-sided, is not well-localized, is not sharp and does not radiate to the arms/jaw/neck. The patient does not complain of nausea and denies diaphoresis. The patient has no history of stroke, has no history of peripheral artery disease, has not smoked in the past 90 days, has no relevant family history of coronary artery disease (first degree relative at less than age 51) and is not hypertensive.   38 year old male sent by PCP office for chest pain.  Patient reports 3 days ago he developed a pressure on his right lower chest area, yesterday this was more lower midsternal area however improving and then today thought that he had a little bit of pressure or discomfort on the left lower chest area.  Patient states pain has been gradually improving over the past few days, did resolve for a period of time yesterday reports fairly noticeable however still present today which prompted him to go to his PCP office.  Patient denies nausea, diaphoresis, shortness of breath.  Denies pain on exertion, orthopnea.  Patient has a history of diabetes and high cholesterol, no history of hypertension.  Family history significant for father who had a pacemaker for bradycardia.     Past Medical History:  Diagnosis Date  . Allergy   . Allergy, unspecified not elsewhere classified    rx w/ OTC antihistamines PRN  . Anxiety   . Asthma   . Atypical chest pain     recent neg eval w/ normal cxr/ekg  . Depression   . Dyspepsia    on protonix 40mg /d  . GERD (gastroesophageal reflux disease)    on protonix 40mg /d  . Hyperlipidemia    on diet alone  . Lumbar back pain    s/p lumbar laminectomy 2007 by DrCabbell  . Overweight(278.02)    weight Jan10=246#.[name].he was 225# in 9/07...diet and exercise was discussed    Patient Active Problem List   Diagnosis Date Noted  . Hypersomnia 11/03/2015  . Obesity 11/03/2015  . Acute bacterial bronchitis 01/18/2015  . Allergic rhinitis 04/15/2014  . Asthma in adult 04/15/2014  . GERD (gastroesophageal reflux disease) 04/15/2014  . IBS (irritable bowel syndrome) 04/01/2013  . Nausea alone 01/15/2013  . Diarrhea 01/15/2013  . Abdominal cramping 01/15/2013  . Depression 10/15/2012  . Lesion of eyebrow 02/09/2012  . Reactive airways dysfunction syndrome (HCC) 07/05/2011  . Gastroparesis 07/05/2011  . Anxiety 07/05/2010  . TESTOSTERONE DEFICIENCY 05/26/2008  . FATIGUE 05/25/2008  . ABDOMINAL BLOATING 04/03/2008  . HYPERLIPIDEMIA 02/06/2008  . OVERWEIGHT 02/06/2008  . BACK PAIN, LUMBAR 02/06/2008  . CHEST PAIN, ATYPICAL 02/06/2008  . GERD 11/19/2007  . ALLERGY 11/18/2007    Past Surgical History:  Procedure Laterality Date  . LUMBAR LAMINECTOMY  2007   DrCabbell  . TEAR DUCT PROBING          Home Medications    Prior to Admission medications   Medication Sig Start Date End Date Taking? Authorizing  Provider  Calcium-Magnesium-Zinc 500-250-12.5 MG TABS Take by mouth 3 (three) times daily.      [provider]  cetirizine (ZYRTEC ALLERGY) 10 MG tablet Take 10 mg by mouth daily as needed.     [provider]  fluticasone (FLONASE) 50 MCG/ACT nasal spray Place 2 sprays into both nostrils daily. 03/16/18   Tracey Harries, FNP  metFORMIN (GLUCOPHAGE) 1000 MG tablet TAKE 1 TABLET(1000 MG) BY MOUTH TWICE DAILY WITH A MEAL 02/12/18   Saguier, Ramon Dredge, PA-C  pantoprazole (PROTONIX) 40  MG tablet TAKE 1 TABLET BY MOUTH DAILY 10/25/16   Saguier, Ramon Dredge, PA-C    Family History Family History  Problem Relation Age of Onset  . Allergies Mother   . Irritable bowel syndrome Mother   . Allergies Father   . Allergies Brother   . Irritable bowel syndrome Brother   . Colon cancer Neg Hx     Social History Social History   Tobacco Use  . Smoking status: Former Smoker    Packs/day: 0.10    Years: 8.00    Pack years: 0.80    Types: Cigarettes    Last attempt to quit: 03/03/2011    Years since quitting: 7.0  . Smokeless tobacco: Never Used  Substance Use Topics  . Alcohol use: Yes    Comment: rare  . Drug use: No     Allergies   Patient has no known allergies.   Review of Systems Review of Systems  Constitutional: Negative for chills, diaphoresis and fever.  Respiratory: Negative for shortness of breath.   Cardiovascular: Positive for chest pain. Negative for palpitations and leg swelling.  Gastrointestinal: Negative for abdominal pain, nausea and vomiting.  Musculoskeletal: Negative for arthralgias, back pain and myalgias.  Skin: Negative for rash and wound.  Allergic/Immunologic: Positive for immunocompromised state.  Neurological: Negative for weakness and headaches.  Hematological: Negative for adenopathy.  Psychiatric/Behavioral: Negative for confusion.  All other systems reviewed and are negative.    Physical Exam Updated Vital Signs BP (!) 125/95 (BP Location: Right Arm)   Pulse 87   Temp 99 F (37.2 C) (Oral)   Resp 20   SpO2 98%   Physical Exam Vitals signs and nursing note reviewed.  Constitutional:      General: He is not in acute distress.    Appearance: He is well-developed. He is not diaphoretic.  HENT:     Head: Normocephalic and atraumatic.  Neck:     Musculoskeletal: Neck supple.  Cardiovascular:     Rate and Rhythm: Normal rate and regular rhythm.     Heart sounds: Normal heart sounds. No murmur.  Pulmonary:     Effort:  Pulmonary effort is normal.     Breath sounds: Normal breath sounds.  Chest:     Chest wall: No tenderness.  Musculoskeletal:     Right lower leg: He exhibits no tenderness. No edema.     Left lower leg: He exhibits no tenderness. No edema.  Skin:    General: Skin is warm and dry.  Neurological:     Mental Status: He is alert and oriented to person, place, and time.  Psychiatric:        Behavior: Behavior normal.      ED Treatments / Results  Labs (all labs ordered are listed, but only abnormal results are displayed) Labs Reviewed  BASIC METABOLIC PANEL - Abnormal; Notable for the following components:      Result Value   Glucose, Bld 257 (*)  All other components within normal limits  CBC  TROPONIN I    EKG EKG Interpretation  Date/Time:  Friday March 29 2018 16:59:46 EST Ventricular Rate:  93 PR Interval:    QRS Duration: 103 QT Interval:  333 QTC Calculation: 415 R Axis:   83 Text Interpretation:  Sinus rhythm Baseline wander in lead(s) V1 Since last tracing rate slower Otherwise no significant change Confirmed by Melene Plan [phone]) on 03/29/2018 5:15:08 PM   Radiology Dg Chest 2 View  Result Date: 03/29/2018 CLINICAL DATA:  Chest pressure for several days EXAM: CHEST - 2 VIEW COMPARISON:  10/27/2016 FINDINGS: The heart size and mediastinal contours are within normal limits. Both lungs are clear. The visualized skeletal structures are unremarkable. IMPRESSION: No active cardiopulmonary disease. Electronically Signed   By: Alcide Clever M.D.   On: 03/29/2018 17:37    Procedures Procedures (including critical care time)  Medications Ordered in ED Medications  aspirin chewable tablet 324 mg (324 mg Oral Given 03/29/18 1803)     Initial Impression / Assessment and Plan / ED Course  I have reviewed the triage vital signs and the nursing notes.  Pertinent labs & imaging results that were available during my care of the patient were reviewed by me and  considered in my medical decision making (see chart for details).  Clinical Course as of Mar 30 1819  Fri Mar 29, 2018  2734 38 year old male presented PCP office for chest pain evaluation.  Heart score is 3, initial troponin negative, CBC normal, BMP with hyperglycemia in known diabetic.  Chest x-ray unremarkable, EKG without ischemic changes.  Discussed with patient standard practice to wait and repeat troponin at 3 hours, patient would prefer to leave now, follow-up with PCP, will return to ER for worsening symptoms.   [LM]    Clinical Course User Index [LM] Alden Hipp    [facility] Score: 3 Final Clinical Impressions(s) / ED Diagnoses   Final diagnoses:  Atypical chest pain    ED Discharge Orders    None       Jeannie Fend, PA-C 03/29/18 1821    Melene Plan, DO 03/29/18 2223

## 2018-03-29 NOTE — ED Notes (Signed)
Pt returned from XR at this time. 

## 2018-03-29 NOTE — ED Notes (Signed)
Pt transported to XR at this time.

## 2018-03-29 NOTE — Progress Notes (Signed)
Subjective:    Patient ID: Anthony Russell, male    DOB: 05/28/1980, 38 y.o.   MRN: [id number]  HPI  Pt in for slight chest pressure with tingling sensation over past 3 days. Pt states when he woke up he still had pressure. This morning pressure 2/10. 3 days ago 3/10, then pain increased to 6/10 2 days ago. Now gradually decreasing since this morning now pain is minimal 1/10.  With chest pressure had no jaw pain, no nausea, no vomiting, no sweating and no shortness of breath. Pt has high cholesterol and is diabetic.I have not seen him for about one year. He states sugars have been controlled per his checks. He states low 100's. Non smoker. States no family hx of cad.  Pt sedentary and does not correlate pressure with activity.    Review of Systems  Constitutional: Negative for chills and fatigue.  HENT: Negative for congestion.   Respiratory: Negative for chest tightness, shortness of breath and wheezing.   Cardiovascular: Negative for chest pain and palpitations.       Chest pressure.  Gastrointestinal: Negative for abdominal pain.  Musculoskeletal: Negative for back pain.  Skin: Negative for rash.  Neurological: Negative for dizziness, weakness, light-headedness and numbness.  Hematological: Negative for adenopathy. Does not bruise/bleed easily.  Psychiatric/Behavioral: Negative for behavioral problems, decreased concentration, dysphoric mood and hallucinations.    Past Medical History:  Diagnosis Date  . Allergy   . Allergy, unspecified not elsewhere classified    rx w/ OTC antihistamines PRN  . Anxiety   . Asthma   . Atypical chest pain    recent neg eval w/ normal cxr/ekg  . Depression   . Dyspepsia    on protonix 40mg /d  . GERD (gastroesophageal reflux disease)    on protonix 40mg /d  . Hyperlipidemia    on diet alone  . Lumbar back pain    s/p lumbar laminectomy 2007 by DrCabbell  . Overweight(278.02)    weight Jan10=246#.[name].he was 225# in 9/07...diet and exercise  was discussed     Social History   Socioeconomic History  . Marital status: Single    Spouse name: Not on file  . Number of children: 0  . Years of education: Not on file  . Highest education level: Not on file  Occupational History  . Occupation: Location manager Needs  . Financial resource strain: Not on file  . Food insecurity:    Worry: Not on file    Inability: Not on file  . Transportation needs:    Medical: Not on file    Non-medical: Not on file  Tobacco Use  . Smoking status: Former Smoker    Packs/day: 0.10    Years: 8.00    Pack years: 0.80    Types: Cigarettes    Last attempt to quit: 03/03/2011    Years since quitting: 7.0  . Smokeless tobacco: Never Used  Substance and Sexual Activity  . Alcohol use: Yes    Comment: rare  . Drug use: No  . Sexual activity: Not on file  Lifestyle  . Physical activity:    Days per week: Not on file    Minutes per session: Not on file  . Stress: Not on file  Relationships  . Social connections:    Talks on phone: Not on file    Gets together: Not on file    Attends religious service: Not on file    Active member of club or organization: Not on  file    Attends meetings of clubs or organizations: Not on file    Relationship status: Not on file  . Intimate partner violence:    Fear of current or ex partner: Not on file    Emotionally abused: Not on file    Physically abused: Not on file    Forced sexual activity: Not on file  Other Topics Concern  . Not on file  Social History Narrative   3 cups caffeine a day    Past Surgical History:  Procedure Laterality Date  . LUMBAR LAMINECTOMY  2007   DrCabbell  . TEAR DUCT PROBING      Family History  Problem Relation Age of Onset  . Allergies Mother   . Irritable bowel syndrome Mother   . Allergies Father   . Allergies Brother   . Irritable bowel syndrome Brother   . Colon cancer Neg Hx     No Known Allergies  Current Outpatient Medications on File Prior to  Visit  Medication Sig Dispense Refill  . Calcium-Magnesium-Zinc 500-250-12.5 MG TABS Take by mouth 3 (three) times daily.      . cetirizine (ZYRTEC ALLERGY) 10 MG tablet Take 10 mg by mouth daily as needed.     . fluticasone (FLONASE) 50 MCG/ACT nasal spray Place 2 sprays into both nostrils daily. 16 g 0  . metFORMIN (GLUCOPHAGE) 1000 MG tablet TAKE 1 TABLET(1000 MG) BY MOUTH TWICE DAILY WITH A MEAL 60 tablet 2  . pantoprazole (PROTONIX) 40 MG tablet TAKE 1 TABLET BY MOUTH DAILY 30 tablet 0   No current facility-administered medications on file prior to visit.     BP 114/72 (BP Location: Left Arm, Cuff Size: Large)   Pulse 93   Temp 98.6 F (37 C) (Oral)   Resp 16   Ht  (1.803 m)   Wt 256 lb 3.2 oz (116.2 kg)   SpO2 98%   BMI 35.73 kg/m       Objective:   Physical Exam  General Mental Status- Alert. General Appearance- Not in acute distress.   Skin General: Color- Normal Color. Moisture- Normal Moisture.  Neck Carotid Arteries- Normal color. Moisture- Normal Moisture. No carotid bruits. No JVD.  Chest and Lung Exam Auscultation: Breath Sounds:-Normal.  Cardiovascular Auscultation:Rythm- Regular. Murmurs & Other Heart Sounds:Auscultation of the heart reveals- No Murmurs.  Abdomen Inspection:-Inspeection Normal. Palpation/Percussion:Note:No mass. Palpation and Percussion of the abdomen reveal- Non Tender, Non Distended + BS, no rebound or guarding.   Neurologic Cranial Nerve exam:- CN III-XII intact(No nystagmus), symmetric smile. Strength:- 5/5 equal and symmetric strength both upper and lower extremities.  Chest- no costochondral junction tenderness to palpation.      Assessment & Plan:  For your recent chest pressure over the last 3 days with persisting pressure presently, I do think it is best for you to be evaluated in the emergency department in light of active symptomatology as well as your history of high cholesterol and diabetes.  Your EKG does  show normal sinus rhythm with no acute ischemic type changes but it would be best to be seen in the emergency department for repeat EKG and cardiac enzymes.  I will go ahead and put in future A1c and metabolic panel that you can do next week.  But would asked that you go down now to the emergency department.  I will go ahead and notify emergency department MD of your recent presentation and your medical history.  Follow-up with me as advised by the  emergency department.  Would request that you do the future fasting labs before follow-up with me.  Please call and schedule those labs to be done.  Esperanza Richters, PA-C

## 2018-03-29 NOTE — Patient Instructions (Signed)
For your recent chest pressure over the last 3 days with persisting pressure presently, I do think it is best for you to be evaluated in the emergency department in light of active symptomatology as well as your history of high cholesterol and diabetes.  Your EKG does show normal sinus rhythm with no acute ischemic type changes but it would be best to be seen in the emergency department for repeat EKG and cardiac enzymes.  I will go ahead and put in future A1c and metabolic panel that you can do next week.  But would asked that you go down now to the emergency department.  I will go ahead and notify emergency department MD of your recent presentation and your medical history.  Follow-up with me as advised by the emergency department.  Would request that you do the future fasting labs before follow-up with me.  Please call and schedule those labs to be done.

## 2018-03-29 NOTE — ED Triage Notes (Signed)
Pt c/o intermittent CP x 3 days-sent from PCP-NAD-steady gait

## 2018-04-17 ENCOUNTER — Other Ambulatory Visit: Payer: Self-pay

## 2018-04-17 ENCOUNTER — Other Ambulatory Visit (INDEPENDENT_AMBULATORY_CARE_PROVIDER_SITE_OTHER): Payer: BLUE CROSS/BLUE SHIELD

## 2018-04-17 DIAGNOSIS — E118 Type 2 diabetes mellitus with unspecified complications: Secondary | ICD-10-CM

## 2018-04-17 DIAGNOSIS — E785 Hyperlipidemia, unspecified: Secondary | ICD-10-CM | POA: Diagnosis not present

## 2018-04-17 LAB — LIPID PANEL
Cholesterol: 192 mg/dL (ref 0–200)
HDL: 28.4 mg/dL — ABNORMAL LOW (ref >39.00)
NonHDL: 163.23
Total CHOL/HDL Ratio: 7
Triglycerides: 349 mg/dL — ABNORMAL HIGH (ref 0.0–149.0)
VLDL: 69.8 mg/dL — ABNORMAL HIGH (ref 0.0–40.0)

## 2018-04-17 LAB — LDL CHOLESTEROL, DIRECT: Direct LDL: 111 mg/dL

## 2018-04-17 LAB — COMPREHENSIVE METABOLIC PANEL
ALT: 99 U/L — ABNORMAL HIGH (ref 0–53)
AST: 34 U/L (ref 0–37)
Albumin: 4.2 g/dL (ref 3.5–5.2)
Alkaline Phosphatase: 61 U/L (ref 39–117)
BUN: 14 mg/dL (ref 6–23)
CO2: 32 meq/L (ref 19–32)
Calcium: 9.7 mg/dL (ref 8.4–10.5)
Chloride: 105 mEq/L (ref 96–112)
Creatinine, Ser: 0.93 mg/dL (ref 0.40–1.50)
GFR: 91.12 mL/min (ref >60.00)
Glucose, Bld: 217 mg/dL — ABNORMAL HIGH (ref 70–99)
Potassium: 4.2 mEq/L (ref 3.5–5.1)
SODIUM: 145 meq/L (ref 135–145)
Total Bilirubin: 0.4 mg/dL (ref 0.2–1.2)
Total Protein: 6.7 g/dL (ref 6.0–8.3)

## 2018-04-17 LAB — HEMOGLOBIN A1C: Hgb A1c MFr Bld: 9.4 % — ABNORMAL HIGH (ref 4.6–6.5)

## 2018-04-18 ENCOUNTER — Telehealth: Payer: Self-pay | Admitting: Medical

## 2018-04-18 MED ORDER — SEMAGLUTIDE(0.25 OR 0.5MG/DOS) 2 MG/1.5ML ~~LOC~~ SOPN: 0.2500 mg | PEN_INJECTOR | SUBCUTANEOUS | 0 refills | Status: DC

## 2018-04-18 MED ORDER — FENOFIBRATE 48 MG PO TABS: 48.0000 mg | ORAL_TABLET | Freq: Every day | ORAL | 3 refills | Status: DC

## 2018-04-18 NOTE — Telephone Encounter (Signed)
Rx fenofibrate and ozempic sent to pt pharmacy.

## 2018-04-19 ENCOUNTER — Encounter: Payer: Self-pay | Admitting: Medical

## 2018-04-19 ENCOUNTER — Other Ambulatory Visit: Payer: Self-pay

## 2018-04-19 ENCOUNTER — Ambulatory Visit (INDEPENDENT_AMBULATORY_CARE_PROVIDER_SITE_OTHER): Payer: BLUE CROSS/BLUE SHIELD | Admitting: Medical

## 2018-04-19 VITALS — BP 127/82 | HR 109 | Temp 98.6°F | Resp 16 | Ht 71.0 in | Wt 250.6 lb

## 2018-04-19 DIAGNOSIS — J301 Allergic rhinitis due to pollen: Secondary | ICD-10-CM | POA: Diagnosis not present

## 2018-04-19 DIAGNOSIS — E119 Type 2 diabetes mellitus without complications: Secondary | ICD-10-CM

## 2018-04-19 DIAGNOSIS — E785 Hyperlipidemia, unspecified: Secondary | ICD-10-CM | POA: Diagnosis not present

## 2018-04-19 MED ORDER — MONTELUKAST SODIUM 10 MG PO TABS: 10.0000 mg | ORAL_TABLET | Freq: Every day | ORAL | 3 refills | Status: DC

## 2018-04-19 MED ORDER — AZELASTINE HCL 0.1 % NA SOLN: 2.0000 | Freq: Two times a day (BID) | NASAL | 12 refills | Status: DC

## 2018-04-19 NOTE — Patient Instructions (Signed)
You do appear to have allergic rhinitis flare due to pollen.  Continue with Zyrtec and Flonase.  Going to add montelukast and Astelin to your regimen.  Will make benzonatate available for cough as well.  For diabetes, I am adding Ozempic.   For high triglycerides, also prescribed fenofibrate.  I recommend that you not take any Tylenol or ibuprofen.  If you start to feel fevers then check your temperature.  If you get any fever above 100.4 then please notify me.  Also if you have any changing or worsening symptoms let me know as well. With recent viral pandemic could arrange for you to have drive-through covid test if your symptoms change.  Follow-up in 7 to 10 days if needed/as needed as well.   If everything goes well can just see you back in about 3 months for routine care on chronic conditions.

## 2018-04-19 NOTE — Progress Notes (Signed)
Subjective:    Patient ID: Anthony Russell, male    DOB: August 08, 1980, 38 y.o.   MRN: [id number]  HPI  Pt in for some dry cough occurring very intermittent. No wheezing or sob. No fever. Some sneezing and some eye itching. Pt does have spring allergies. He was outdoors on Sunday and noticed symptoms. No fever.  Pt has started zyrtec and flonase. Has been on for 2 weeks.    Reviewed also labs last night. I rx'd ozempic since a1c was worse in 9 range. Also rx'd fenofibrate for high tryglicerides.    Review of Systems  Constitutional: Negative for chills, fatigue and fever.  HENT: Positive for congestion, postnasal drip, rhinorrhea and sneezing. Negative for facial swelling, sinus pressure and sinus pain.   Eyes: Positive for itching.  Respiratory: Negative for chest tightness, shortness of breath and wheezing.   Cardiovascular: Negative for chest pain and palpitations.  Gastrointestinal: Negative for abdominal pain.  Musculoskeletal: Negative for back pain, myalgias and neck stiffness.  Skin: Negative for pallor.  Hematological: Negative for adenopathy. Does not bruise/bleed easily.  Psychiatric/Behavioral: Negative for behavioral problems, confusion and sleep disturbance. The patient is not nervous/anxious.    Past Medical History:  Diagnosis Date  . Allergy   . Allergy, unspecified not elsewhere classified    rx w/ OTC antihistamines PRN  . Anxiety   . Asthma   . Atypical chest pain    recent neg eval w/ normal cxr/ekg  . Depression   . Dyspepsia    on protonix 40mg /d  . GERD (gastroesophageal reflux disease)    on protonix 40mg /d  . Hyperlipidemia    on diet alone  . Lumbar back pain    s/p lumbar laminectomy 2007 by DrCabbell  . Overweight(278.02)    weight Jan10=246#.[name].he was 225# in 9/07...diet and exercise was discussed     Social History   Socioeconomic History  . Marital status: Single    Spouse name: Not on file  . Number of children: 0  . Years of  education: Not on file  . Highest education level: Not on file  Occupational History  . Occupation: Location manager Needs  . Financial resource strain: Not on file  . Food insecurity:    Worry: Not on file    Inability: Not on file  . Transportation needs:    Medical: Not on file    Non-medical: Not on file  Tobacco Use  . Smoking status: Former Smoker    Packs/day: 0.10    Years: 8.00    Pack years: 0.80    Types: Cigarettes    Last attempt to quit: 03/03/2011    Years since quitting: 7.1  . Smokeless tobacco: Never Used  Substance and Sexual Activity  . Alcohol use: Yes    Comment: rare  . Drug use: No  . Sexual activity: Not on file  Lifestyle  . Physical activity:    Days per week: Not on file    Minutes per session: Not on file  . Stress: Not on file  Relationships  . Social connections:    Talks on phone: Not on file    Gets together: Not on file    Attends religious service: Not on file    Active member of club or organization: Not on file    Attends meetings of clubs or organizations: Not on file    Relationship status: Not on file  . Intimate partner violence:    Fear of current  or ex partner: Not on file    Emotionally abused: Not on file    Physically abused: Not on file    Forced sexual activity: Not on file  Other Topics Concern  . Not on file  Social History Narrative   3 cups caffeine a day    Past Surgical History:  Procedure Laterality Date  . LUMBAR LAMINECTOMY  2007   DrCabbell  . TEAR DUCT PROBING      Family History  Problem Relation Age of Onset  . Allergies Mother   . Irritable bowel syndrome Mother   . Allergies Father   . Allergies Brother   . Irritable bowel syndrome Brother   . Colon cancer Neg Hx     No Known Allergies  Current Outpatient Medications on File Prior to Visit  Medication Sig Dispense Refill  . Calcium-Magnesium-Zinc 500-250-12.5 MG TABS Take by mouth 3 (three) times daily.      . cetirizine (ZYRTEC  ALLERGY) 10 MG tablet Take 10 mg by mouth daily as needed.     . fenofibrate (TRICOR) 48 MG tablet Take 1 tablet (48 mg total) by mouth daily. 30 tablet 3  . fluticasone (FLONASE) 50 MCG/ACT nasal spray Place 2 sprays into both nostrils daily. 16 g 0  . metFORMIN (GLUCOPHAGE) 1000 MG tablet TAKE 1 TABLET(1000 MG) BY MOUTH TWICE DAILY WITH A MEAL 60 tablet 2  . pantoprazole (PROTONIX) 40 MG tablet TAKE 1 TABLET BY MOUTH DAILY 30 tablet 0  . Semaglutide,0.25 or 0.5MG /DOS, (OZEMPIC, 0.25 OR 0.5 MG/DOSE,) 2 MG/1.5ML SOPN Inject 0.25 mg into the skin once a week. 1 pen 0   No current facility-administered medications on file prior to visit.     BP 127/82   Pulse (!) 109   Temp 98.6 F (37 C) (Oral)   Resp 16   Ht 5\' 11"  (1.803 m)   Wt 250 lb 9.6 oz (113.7 kg)   SpO2 99%   BMI 34.95 kg/m       Objective:   Physical Exam  General  Mental Status - Alert. General Appearance - Well groomed. Not in acute distress.  Skin Rashes- No Rashes.  HEENT Head- Normal. Ear Auditory Canal - Left- Normal. Right - Normal.Tympanic Membrane- Left- Normal. Right- Normal. Eye Sclera/Conjunctiva- Left- Normal. Right- Normal. Nose & Sinuses Nasal Mucosa- Left-  Boggy and Congested. Right-  Boggy and  Congested.Bilateral no maxillary and no  frontal sinus pressure. Mouth & Throat Lips: Upper Lip- Normal: no dryness, cracking, pallor, cyanosis, or vesicular eruption. Lower Lip-Normal: no dryness, cracking, pallor, cyanosis or vesicular eruption. Buccal Mucosa- Bilateral- No Aphthous ulcers. Oropharynx- No Discharge or Erythema. +pnd. Tonsils: Characteristics- Bilateral- No Erythema or Congestion. Size/Enlargement- Bilateral- No enlargement. Discharge- bilateral-None.  Neck Neck- Supple. No Masses.   Chest and Lung Exam Auscultation: Breath Sounds:-Clear even and unlabored.  Cardiovascular Auscultation:Rythm- Regular, rate and rhythm. Murmurs & Other Heart Sounds:Ausculatation of the heart  reveal- No Murmurs.  Lymphatic Head & Neck General Head & Neck Lymphatics: Bilateral: Description- No Localized lymphadenopathy.       Assessment & Plan:  You do appear to have allergic rhinitis flare due to pollen.  Continue with Zyrtec and Flonase.  Going to add montelukast and Astelin to your regimen.  Will make benzonatate available for cough as well.  For diabetes, I am adding Ozempic.   For high triglycerides, also prescribed fenofibrate.  I recommend that you not take any Tylenol or ibuprofen.  If you start to feel fevers  then check your temperature.  If you get any fever above 100.4 then please notify me.  Also if you have any changing or worsening symptoms let me know as well. With recent viral pandemic could arrange for you to have drive-through covid test if your symptoms change.  Follow-up in 7 to 10 days if needed/as needed as well.   If everything goes well can just see you back in about 3 months for routine care on chronic conditions.  Esperanza Richters, PA-C

## 2018-05-15 ENCOUNTER — Other Ambulatory Visit: Payer: Self-pay | Admitting: Medical

## 2018-06-13 ENCOUNTER — Other Ambulatory Visit: Payer: Self-pay | Admitting: Medical

## 2018-06-21 ENCOUNTER — Other Ambulatory Visit: Payer: Self-pay | Admitting: Medical

## 2018-06-21 NOTE — Telephone Encounter (Signed)
Copied from CRM [id number]. Topic: Quick Communication - Rx Refill/Question >> Jun 21, 2018  4:23 PM Mcneil, Ja-Kwan wrote: Medication:  pantoprazole (PROTONIX) 40 MG tablet   Has the patient contacted their pharmacy? no  Preferred Pharmacy (with phone number or street name): [id number] - [place], [place] - [address] [phone] (Phone)  [phone] (Fax)  Agent: Please be advised that RX refills may take up to 3 business days. We ask that you follow-up with your pharmacy.

## 2018-06-25 MED ORDER — PANTOPRAZOLE SODIUM 40 MG PO TBEC: 40.0000 mg | DELAYED_RELEASE_TABLET | Freq: Every day | ORAL | 3 refills | Status: DC

## 2018-06-25 NOTE — Telephone Encounter (Signed)
Refills sent

## 2018-08-16 ENCOUNTER — Other Ambulatory Visit: Payer: Self-pay | Admitting: Medical

## 2018-11-04 ENCOUNTER — Telehealth: Payer: Self-pay

## 2018-11-04 ENCOUNTER — Telehealth: Payer: BLUE CROSS/BLUE SHIELD | Admitting: Nurse Practitioner

## 2018-11-04 ENCOUNTER — Encounter (INDEPENDENT_AMBULATORY_CARE_PROVIDER_SITE_OTHER): Payer: Self-pay

## 2018-11-04 DIAGNOSIS — Z20822 Contact with and (suspected) exposure to covid-19: Secondary | ICD-10-CM

## 2018-11-04 DIAGNOSIS — R05 Cough: Secondary | ICD-10-CM

## 2018-11-04 DIAGNOSIS — Z20828 Contact with and (suspected) exposure to other viral communicable diseases: Secondary | ICD-10-CM

## 2018-11-04 DIAGNOSIS — R059 Cough, unspecified: Secondary | ICD-10-CM

## 2018-11-04 DIAGNOSIS — R0981 Nasal congestion: Secondary | ICD-10-CM

## 2018-11-04 DIAGNOSIS — R197 Diarrhea, unspecified: Secondary | ICD-10-CM

## 2018-11-04 MED ORDER — BENZONATATE 100 MG PO CAPS: 100.0000 mg | ORAL_CAPSULE | Freq: Three times a day (TID) | ORAL | 0 refills | Status: DC | PRN

## 2018-11-04 NOTE — Progress Notes (Signed)
E-Visit for Corona Virus Screening   Your current symptoms could be consistent with the coronavirus.  Many health care providers can now test patients at their office but not all are.  [facility] has multiple testing sites. For information on our COVID testing locations and hours go to [email or web address]  Please quarantine yourself while awaiting your test results.  We are enrolling you in our MyChart Home Montioring for COVID19 . Daily you will receive a questionnaire within the MyChart website. Our COVID 19 response team willl be monitoriing your responses daily.  You can go to one of the  testing sites listed below, while they are opened (see hours). You do not need a doctors order to be tested for covid.You do need to self-isolate until your results return and if positive 14 days from when your symptoms started and until you are 3 days symptom free.   Testing Locations (Monday - Friday, 8 a.m. - 3:30 p.m.) . [place]: [facility], [address], [place], [place]  . [place]: [facility], [address], [place], [place] (entrance off [address])  . [place]: [address], [place], [place] (across from [facility])    COVID-19 is a respiratory illness with symptoms that are similar to the flu. Symptoms are typically mild to moderate, but there have been cases of severe illness and death due to the virus. The following symptoms may appear 2-14 days after exposure: . Fever . Cough . Shortness of breath or difficulty breathing . Chills . Repeated shaking with chills . Muscle pain . Headache . Sore throat . New loss of taste or smell . Fatigue . Congestion or runny nose . Nausea or vomiting . Diarrhea  It is vitally important that if you feel that you have an infection such as this virus or any other virus that you stay home and away from places where you may  spread it to others.  You should self-quarantine for 14 days if you have symptoms that could potentially be coronavirus or have been in close contact a with a person diagnosed with COVID-19 within the last 2 weeks. You should avoid contact with people age 65 and older.   You should wear a mask or cloth face covering over your nose and mouth if you must be around other people or animals, including pets (even at home). Try to stay at least 6 feet away from other people. This will protect the people around you.  You can use medication such as A prescription cough medication called Tessalon Perles 100 mg. You may take 1-2 capsules every 8 hours as needed for cough  You may also take acetaminophen (Tylenol) as needed for fever.   Reduce your risk of any infection by using the same precautions used for avoiding the common cold or flu:  . Wash your hands often with soap and warm water for at least 20 seconds.  If soap and water are not readily available, use an alcohol-based hand sanitizer with at least 60% alcohol.  . If coughing or sneezing, cover your mouth and nose by coughing or sneezing into the elbow areas of your shirt or coat, into a tissue or into your sleeve (not your hands). . Avoid shaking hands with others and consider head nods or verbal greetings only. . Avoid touching your eyes, nose, or mouth with unwashed hands.  . Avoid close contact with people who are sick. . Avoid places or   events with large numbers of people in one location, like concerts or sporting events. . Carefully consider travel plans you have or are making. . If you are planning any travel outside or inside the [place], visit the CDC's Travelers' Health webpage for the latest health notices. . If you have some symptoms but not all symptoms, continue to monitor at home and seek medical attention if your symptoms worsen. . If you are having a medical emergency, call 911.  HOME CARE . Only take medications as instructed by your  medical team. . Drink plenty of fluids and get plenty of rest. . A steam or ultrasonic humidifier can help if you have congestion.   GET HELP RIGHT AWAY IF YOU HAVE EMERGENCY WARNING SIGNS** FOR COVID-19. If you or someone is showing any of these signs seek emergency medical care immediately. Call 911 or proceed to your closest emergency facility if: . You develop worsening high fever. . Trouble breathing . Bluish lips or face . Persistent pain or pressure in the chest . New confusion . Inability to wake or stay awake . You cough up blood. . Your symptoms become more severe  **This list is not all possible symptoms. Contact your medical provider for any symptoms that are sever or concerning to you.   MAKE SURE YOU   Understand these instructions.  Will watch your condition.  Will get help right away if you are not doing well or get worse.  Your e-visit answers were reviewed by a board certified advanced clinical practitioner to complete your personal care plan.  Depending on the condition, your plan could have included both over the counter or prescription medications.  If there is a problem please reply once you have received a response from your provider.  Your safety is important to us.  If you have drug allergies check your prescription carefully.    You can use MyChart to ask questions about today's visit, request a non-urgent call back, or ask for a work or school excuse for 24 hours related to this e-Visit. If it has been greater than 24 hours you will need to follow up with your provider, or enter a new e-Visit to address those concerns. You will get an e-mail in the next two days asking about your experience.  I hope that your e-visit has been valuable and will speed your recovery. Thank you for using e-visits.   5-10 minutes spent reviewing and documenting in chart.  

## 2018-11-04 NOTE — Telephone Encounter (Signed)
Left a vm for patient to callback regarding covid questionnaire .

## 2018-11-05 ENCOUNTER — Other Ambulatory Visit: Payer: Self-pay

## 2018-11-05 ENCOUNTER — Encounter (INDEPENDENT_AMBULATORY_CARE_PROVIDER_SITE_OTHER): Payer: Self-pay

## 2018-11-05 DIAGNOSIS — Z20828 Contact with and (suspected) exposure to other viral communicable diseases: Secondary | ICD-10-CM | POA: Diagnosis not present

## 2018-11-05 DIAGNOSIS — Z20822 Contact with and (suspected) exposure to covid-19: Secondary | ICD-10-CM

## 2018-11-06 ENCOUNTER — Encounter (INDEPENDENT_AMBULATORY_CARE_PROVIDER_SITE_OTHER): Payer: Self-pay

## 2018-11-07 ENCOUNTER — Encounter (INDEPENDENT_AMBULATORY_CARE_PROVIDER_SITE_OTHER): Payer: Self-pay

## 2018-11-07 LAB — NOVEL CORONAVIRUS, NAA: SARS-CoV-2, NAA: NOT DETECTED

## 2018-11-08 ENCOUNTER — Encounter (INDEPENDENT_AMBULATORY_CARE_PROVIDER_SITE_OTHER): Payer: Self-pay

## 2018-11-11 ENCOUNTER — Encounter: Payer: Self-pay | Admitting: Medical

## 2018-11-11 ENCOUNTER — Other Ambulatory Visit: Payer: Self-pay

## 2018-11-11 ENCOUNTER — Ambulatory Visit (INDEPENDENT_AMBULATORY_CARE_PROVIDER_SITE_OTHER): Payer: BC Managed Care – PPO | Admitting: Medical

## 2018-11-11 VITALS — Temp 95.7°F | Wt 247.8 lb

## 2018-11-11 DIAGNOSIS — J208 Acute bronchitis due to other specified organisms: Secondary | ICD-10-CM | POA: Diagnosis not present

## 2018-11-11 DIAGNOSIS — J329 Chronic sinusitis, unspecified: Secondary | ICD-10-CM

## 2018-11-11 DIAGNOSIS — B9689 Other specified bacterial agents as the cause of diseases classified elsewhere: Secondary | ICD-10-CM

## 2018-11-11 MED ORDER — HYDROCODONE-HOMATROPINE 5-1.5 MG/5ML PO SYRP: 5.0000 mL | ORAL_SOLUTION | Freq: Four times a day (QID) | ORAL | 0 refills | Status: DC | PRN

## 2018-11-11 MED ORDER — AZITHROMYCIN 250 MG PO TABS: ORAL_TABLET | ORAL | 0 refills | Status: DC

## 2018-11-11 MED ORDER — FLUTICASONE PROPIONATE 50 MCG/ACT NA SUSP: 2.0000 | Freq: Every day | NASAL | 1 refills | Status: DC

## 2018-11-11 NOTE — Progress Notes (Signed)
   Subjective:    Patient ID: Anthony Russell, male    DOB: 1980/03/30, 38 y.o.   MRN: [id number]  HPI Virtual Visit via Video Note  I connected with Anthony Russell on 11/11/18 at 11:00 AM EDT by a video enabled telemedicine application and verified that I am speaking with the correct person using two identifiers.  Location: Patient: home Provider: office   I discussed the limitations of evaluation and management by telemedicine and the availability of in person appointments. The patient expressed understanding and agreed to proceed.  History of Present Illness: Pt has 1 week of dry and productive cough. Most of time productive cough. No fevers, chills or sweats. No wheezing. He can sleep with cough.  Pt states no diffuse myalgias.    Pt felt briefly some abdomen pain other day while sleeping but none since.   Pt has not had any diarrhea.  Pt states his room mate got sick around same time.  Pt states last Tuesday am had covid  and on Thursday came back negative.   Pt states sinus ha the other day.   But states most of chest congestion in his chest.   Pt coughing despite use of benzonatate.    Observations/Objective: General- no acute distress, pleasant, normal speech. Cough intermittent during exam.  Assessment and Plan:  You appear to have bronchitis and sinusitis. Rest hydrate and tylenol for fever. I am prescribing cough medicine hycodan, and azithromycin antibiotic. For your nasal congestion you could use otc the counter nasal steroid.   You should gradually get better. If not then notify us and would recommend a chest xray and possible repeat covid test. Explained possible false negative covid test but doubt presently. Stay at home/quarantine until you give [place] update.  Follow up 4 days or as needed  Follow Up Instructions:    I discussed the assessment and treatment plan with the patient. The patient was provided an opportunity to ask questions and all were  answered. The patient agreed with the plan and demonstrated an understanding of the instructions.   The patient was advised to call back or seek an in-person evaluation if the symptoms worsen or if the condition fails to improve as anticipated.  I provided 15 minutes of non-face-to-face time during this encounter.   Mackie Pai, PA-C    Review of Systems     Objective:   Physical Exam        Assessment & Plan:

## 2018-11-11 NOTE — Patient Instructions (Addendum)
You appear to have bronchitis and sinusitis. Rest hydrate and tylenol for fever. I am prescribing cough medicine hycodan, and azithromycin antibiotic. For your nasal congestion you could use otc the counter nasal steroid.   You should gradually get better. If not then notify us and would recommend a chest xray and possible repeat covid test. Explained possible false negative covid test but doubt presently. Stay at home/quarantine until you give [place] update.  Follow up 4 days or as needed

## 2018-11-14 ENCOUNTER — Telehealth: Payer: Self-pay | Admitting: Medical

## 2018-11-14 NOTE — Telephone Encounter (Signed)
Pt called to let us know that he is feeling better but still congested and would like to know if he should have another covid test done. Please advise

## 2018-11-14 NOTE — Telephone Encounter (Signed)
If pt is not any better or if worse then can repeat covid test. But if he says he is worse then offer appointment as might modify treatment before the weekend.

## 2018-11-15 ENCOUNTER — Other Ambulatory Visit: Payer: Self-pay

## 2018-11-15 ENCOUNTER — Telehealth: Payer: Self-pay | Admitting: Medical

## 2018-11-15 ENCOUNTER — Telehealth (INDEPENDENT_AMBULATORY_CARE_PROVIDER_SITE_OTHER): Payer: BC Managed Care – PPO | Admitting: Medical

## 2018-11-15 ENCOUNTER — Encounter: Payer: Self-pay | Admitting: Medical

## 2018-11-15 ENCOUNTER — Encounter (INDEPENDENT_AMBULATORY_CARE_PROVIDER_SITE_OTHER): Payer: Self-pay

## 2018-11-15 VITALS — Temp 97.5°F | Ht 71.0 in | Wt 250.4 lb

## 2018-11-15 DIAGNOSIS — J208 Acute bronchitis due to other specified organisms: Secondary | ICD-10-CM | POA: Diagnosis not present

## 2018-11-15 DIAGNOSIS — B9689 Other specified bacterial agents as the cause of diseases classified elsewhere: Secondary | ICD-10-CM

## 2018-11-15 DIAGNOSIS — R05 Cough: Secondary | ICD-10-CM | POA: Diagnosis not present

## 2018-11-15 DIAGNOSIS — R195 Other fecal abnormalities: Secondary | ICD-10-CM | POA: Diagnosis not present

## 2018-11-15 DIAGNOSIS — Z20828 Contact with and (suspected) exposure to other viral communicable diseases: Secondary | ICD-10-CM | POA: Diagnosis not present

## 2018-11-15 DIAGNOSIS — Z20822 Contact with and (suspected) exposure to covid-19: Secondary | ICD-10-CM

## 2018-11-15 DIAGNOSIS — R059 Cough, unspecified: Secondary | ICD-10-CM

## 2018-11-15 MED ORDER — DOXYCYCLINE HYCLATE 100 MG PO TABS: 100.0000 mg | ORAL_TABLET | Freq: Two times a day (BID) | ORAL | 0 refills | Status: DC

## 2018-11-15 NOTE — Telephone Encounter (Signed)
Pt scheduled virtual visit today. 

## 2018-11-15 NOTE — Patient Instructions (Signed)
Your bronchitis type symptoms are still present. Will rx doxycycline antibiotic and advise continue hycodan. Can refill hycodan if needed when current rx expires.   Your loose stools have decreased over past week. Hopefully will taper off.  Repeat covid test pending. Continue to stay at home over weekend.  If covid test negative and symptoms persist then will arrange for chest xray.  Follow up in 7 days or as needed

## 2018-11-15 NOTE — Telephone Encounter (Signed)
Pt called due to BPA for worsening cough received from MyChart COVID symptom Questionnaire. Pt had a virtual visit today with Edward,PA and the pt's cough was addressed at the time of the visit. No other concerns voiced at this time.

## 2018-11-15 NOTE — Progress Notes (Signed)
Virtual Visit via Video Note  I connected with Anthony Russell on 11/15/18 at  3:00 PM EDT by a video enabled telemedicine application and verified that I am speaking with the correct person using two identifiers.  Location: Patient: home Provider:office   I discussed the limitations of evaluation and management by telemedicine and the availability of in person appointments. The patient expressed understanding and agreed to proceed.  History of Present Illness: Pt states minimal improvement of cough and nasal congestion. Pt take hydrocodone and give  brief improvement. Pt has not been wheezing.   Some ha, no body aches, no fever and no chills. Smell and taste normal. Pt has been having diarrhea over past. Now only 1 loose stool a day. Not watery.   Sinus are not hurting but does have pressure.  Cough is still productive at time.    Observations/Objective:   General-no acute distress, pleasant, oriented. Lungs- on inspection lungs appear unlabored. Neck- no tracheal deviation or jvd on inspection. Neuro- gross motor function appears intact.  Assessment and Plan: Your bronchitis type symptoms are still present. Will rx doxycycline antibiotic and advise continue hycodan. Can refill hycodan if needed when current rx expires.   Your loose stools have decreased over past week. Hopefully will taper off.  Repeat covid test pending. Continue to stay at home over weekend.  If covid test negative and symptoms persist then will arrange for chest xray.  Follow up in 7 days or as needed  Mackie Pai, PA-C  Follow Up Instructions:    I discussed the assessment and treatment plan with the patient. The patient was provided an opportunity to ask questions and all were answered. The patient agreed with the plan and demonstrated an understanding of the instructions.   The patient was advised to call back or seek an in-person evaluation if the symptoms worsen or if the condition fails to  improve as anticipated.  I provided 25 minutes of non-face-to-face time during this encounter.   Mackie Pai, PA-C

## 2018-11-17 ENCOUNTER — Encounter: Payer: Self-pay | Admitting: Medical

## 2018-11-17 LAB — NOVEL CORONAVIRUS, NAA: SARS-CoV-2, NAA: NOT DETECTED

## 2018-11-18 ENCOUNTER — Telehealth: Payer: Self-pay | Admitting: Medical

## 2018-11-18 DIAGNOSIS — J208 Acute bronchitis due to other specified organisms: Secondary | ICD-10-CM

## 2018-11-18 DIAGNOSIS — R059 Cough, unspecified: Secondary | ICD-10-CM

## 2018-11-18 DIAGNOSIS — R05 Cough: Secondary | ICD-10-CM

## 2018-11-18 DIAGNOSIS — B9689 Other specified bacterial agents as the cause of diseases classified elsewhere: Secondary | ICD-10-CM

## 2018-11-18 NOTE — Telephone Encounter (Signed)
Placed future chest xray.

## 2018-11-19 ENCOUNTER — Telehealth: Payer: Self-pay | Admitting: Medical

## 2018-11-19 ENCOUNTER — Ambulatory Visit (HOSPITAL_BASED_OUTPATIENT_CLINIC_OR_DEPARTMENT_OTHER)
Admission: RE | Admit: 2018-11-19 | Discharge: 2018-11-19 | Disposition: A | Discharge status: Home / Self Care | Payer: BC Managed Care – PPO | Source: Ambulatory Visit | Attending: Medical | Admitting: Medical

## 2018-11-19 ENCOUNTER — Other Ambulatory Visit: Payer: Self-pay

## 2018-11-19 DIAGNOSIS — R05 Cough: Secondary | ICD-10-CM | POA: Insufficient documentation

## 2018-11-19 DIAGNOSIS — J208 Acute bronchitis due to other specified organisms: Secondary | ICD-10-CM | POA: Insufficient documentation

## 2018-11-19 DIAGNOSIS — B9689 Other specified bacterial agents as the cause of diseases classified elsewhere: Secondary | ICD-10-CM

## 2018-11-19 DIAGNOSIS — R197 Diarrhea, unspecified: Secondary | ICD-10-CM

## 2018-11-19 DIAGNOSIS — R059 Cough, unspecified: Secondary | ICD-10-CM

## 2018-11-19 MED ORDER — ALBUTEROL SULFATE HFA 108 (90 BASE) MCG/ACT IN AERS: 2.0000 | INHALATION_SPRAY | Freq: Four times a day (QID) | RESPIRATORY_TRACT | 0 refills | Status: DC | PRN

## 2018-11-19 NOTE — Telephone Encounter (Signed)
Notified pt he can come by to pick up kit

## 2018-11-19 NOTE — Telephone Encounter (Signed)
Future order gastro panel placed. Can you help pt get scheduled to pick up kit so we can get it back before the weekend to run.

## 2018-11-19 NOTE — Telephone Encounter (Signed)
Rx albuterol inhaler sent to pharmacy. 

## 2018-11-25 ENCOUNTER — Encounter: Payer: Self-pay | Admitting: Medical

## 2018-11-28 ENCOUNTER — Ambulatory Visit (INDEPENDENT_AMBULATORY_CARE_PROVIDER_SITE_OTHER): Payer: BC Managed Care – PPO | Admitting: Medical

## 2018-11-28 ENCOUNTER — Telehealth: Payer: Self-pay | Admitting: Medical

## 2018-11-28 ENCOUNTER — Other Ambulatory Visit: Payer: Self-pay

## 2018-11-28 ENCOUNTER — Ambulatory Visit (HOSPITAL_BASED_OUTPATIENT_CLINIC_OR_DEPARTMENT_OTHER)
Admission: RE | Admit: 2018-11-28 | Discharge: 2018-11-28 | Disposition: A | Discharge status: Home / Self Care | Payer: BC Managed Care – PPO | Source: Ambulatory Visit | Attending: Medical | Admitting: Medical

## 2018-11-28 VITALS — BP 110/72 | HR 94 | Temp 97.8°F | Resp 18 | Wt 249.6 lb

## 2018-11-28 DIAGNOSIS — E785 Hyperlipidemia, unspecified: Secondary | ICD-10-CM | POA: Diagnosis not present

## 2018-11-28 DIAGNOSIS — M25511 Pain in right shoulder: Secondary | ICD-10-CM

## 2018-11-28 DIAGNOSIS — J3489 Other specified disorders of nose and nasal sinuses: Secondary | ICD-10-CM | POA: Diagnosis not present

## 2018-11-28 DIAGNOSIS — E119 Type 2 diabetes mellitus without complications: Secondary | ICD-10-CM

## 2018-11-28 DIAGNOSIS — R5383 Other fatigue: Secondary | ICD-10-CM

## 2018-11-28 DIAGNOSIS — R05 Cough: Secondary | ICD-10-CM

## 2018-11-28 DIAGNOSIS — R195 Other fecal abnormalities: Secondary | ICD-10-CM

## 2018-11-28 DIAGNOSIS — J683 Other acute and subacute respiratory conditions due to chemicals, gases, fumes and vapors: Secondary | ICD-10-CM | POA: Diagnosis not present

## 2018-11-28 DIAGNOSIS — R0981 Nasal congestion: Secondary | ICD-10-CM | POA: Diagnosis not present

## 2018-11-28 DIAGNOSIS — M2550 Pain in unspecified joint: Secondary | ICD-10-CM | POA: Diagnosis not present

## 2018-11-28 DIAGNOSIS — R059 Cough, unspecified: Secondary | ICD-10-CM

## 2018-11-28 LAB — CBC WITH DIFFERENTIAL/PLATELET
Basophils Absolute: 0.1 10*3/uL (ref 0.0–0.1)
Basophils Relative: 0.7 % (ref 0.0–3.0)
Eosinophils Absolute: 0.1 10*3/uL (ref 0.0–0.7)
Eosinophils Relative: 1 % (ref 0.0–5.0)
HCT: 47.4 % (ref 39.0–52.0)
Hemoglobin: 15.9 g/dL (ref 13.0–17.0)
Lymphocytes Relative: 25.7 % (ref 12.0–46.0)
Lymphs Abs: 1.9 10*3/uL (ref 0.7–4.0)
MCHC: 33.5 g/dL (ref 30.0–36.0)
MCV: 89.4 fl (ref 78.0–100.0)
Monocytes Absolute: 0.5 10*3/uL (ref 0.1–1.0)
Monocytes Relative: 6.1 % (ref 3.0–12.0)
Neutro Abs: 5 10*3/uL (ref 1.4–7.7)
Neutrophils Relative %: 66.5 % (ref 43.0–77.0)
Platelets: 177 10*3/uL (ref 150.0–400.0)
RBC: 5.31 Mil/uL (ref 4.22–5.81)
RDW: 14.1 % (ref 11.5–15.5)
WBC: 7.5 10*3/uL (ref 4.0–10.5)

## 2018-11-28 LAB — COMPREHENSIVE METABOLIC PANEL
ALT: 80 U/L — ABNORMAL HIGH (ref 0–53)
AST: 39 U/L — ABNORMAL HIGH (ref 0–37)
Albumin: 4.4 g/dL (ref 3.5–5.2)
Alkaline Phosphatase: 75 U/L (ref 39–117)
BUN: 7 mg/dL (ref 6–23)
CO2: 30 mEq/L (ref 19–32)
Calcium: 9.5 mg/dL (ref 8.4–10.5)
Chloride: 103 mEq/L (ref 96–112)
Creatinine, Ser: 0.99 mg/dL (ref 0.40–1.50)
GFR: 84.5 mL/min (ref >60.00)
Glucose, Bld: 205 mg/dL — ABNORMAL HIGH (ref 70–99)
Potassium: 4 mEq/L (ref 3.5–5.1)
Sodium: 141 mEq/L (ref 135–145)
Total Bilirubin: 0.7 mg/dL (ref 0.2–1.2)
Total Protein: 6.9 g/dL (ref 6.0–8.3)

## 2018-11-28 LAB — LIPID PANEL
Cholesterol: 192 mg/dL (ref 0–200)
HDL: 29.7 mg/dL — ABNORMAL LOW (ref >39.00)
NonHDL: 162.61
Total CHOL/HDL Ratio: 6
Triglycerides: 222 mg/dL — ABNORMAL HIGH (ref 0.0–149.0)
VLDL: 44.4 mg/dL — ABNORMAL HIGH (ref 0.0–40.0)

## 2018-11-28 LAB — SEDIMENTATION RATE: Sed Rate: 13 mm/hr (ref 0–15)

## 2018-11-28 LAB — HEMOGLOBIN A1C: Hgb A1c MFr Bld: 10 % — ABNORMAL HIGH (ref 4.6–6.5)

## 2018-11-28 LAB — C-REACTIVE PROTEIN: CRP: <1 mg/dL (ref 0.5–20.0)

## 2018-11-28 LAB — LDL CHOLESTEROL, DIRECT: Direct LDL: 120 mg/dL

## 2018-11-28 MED ORDER — FLUTICASONE PROPIONATE HFA 110 MCG/ACT IN AERO: 2.0000 | INHALATION_SPRAY | Freq: Two times a day (BID) | RESPIRATORY_TRACT | 3 refills | Status: DC

## 2018-11-28 MED ORDER — PREDNISONE 10 MG PO TABS: ORAL_TABLET | ORAL | 0 refills | Status: DC

## 2018-11-28 MED ORDER — ATORVASTATIN CALCIUM 10 MG PO TABS: 10.0000 mg | ORAL_TABLET | Freq: Every day | ORAL | 3 refills | Status: DC

## 2018-11-28 MED ORDER — LEVOCETIRIZINE DIHYDROCHLORIDE 5 MG PO TABS: 5.0000 mg | ORAL_TABLET | Freq: Every evening | ORAL | 3 refills | Status: AC

## 2018-11-28 NOTE — Progress Notes (Signed)
Subjective:    Patient ID: Anthony Russell, male    DOB: 04/01/1980, 38 y.o.   MRN: [id number]  HPI Pt in for follow up.  Pt is still coughing but not coughing quite as bad as before. No intermittent mild cough. Was coughing a lot before. No wheezing. Intermittent very  Mild transient  sob with activity. Pt has had 2 covid test both negative since onset of symptoms. Pt chest xray was negative on 11/19/2018. Pt did did take round of azithromycin. Also rx'd benzonatate and albuterol inhaler. Pt notes that if he uses albuterol this seems to help cough.  I had written pt for hycodan also. He is out of hycodan.  Pt states he has intermittently getting soft stools with some watery stools. Most are soft and formed.   Pt is diabetic. His last sugar level was 110. Pt is trying to eliminate sodas all together. Last a1c was in march.    Review of Systems  Constitutional: Positive for fatigue. Negative for chills and fever.       Baseline always seems to sweat when I see him for visits.  HENT: Positive for congestion and sinus pressure. Negative for sinus pain, sneezing, sore throat and trouble swallowing.   Respiratory: Negative for chest tightness, shortness of breath and wheezing.   Cardiovascular: Negative for chest pain and palpitations.  Gastrointestinal: Negative for abdominal pain.  Genitourinary: Negative for dysuria and frequency.  Musculoskeletal: Negative for back pain and joint swelling.  Neurological: Negative for dizziness, speech difficulty, weakness and headaches.  Hematological: Negative for adenopathy. Does not bruise/bleed easily.  Psychiatric/Behavioral: Negative for behavioral problems and confusion.   Past Medical History:  Diagnosis Date  . Allergy   . Allergy, unspecified not elsewhere classified    rx w/ OTC antihistamines PRN  . Anxiety   . Asthma   . Atypical chest pain    recent neg eval w/ normal cxr/ekg  . Depression   . Dyspepsia    on protonix 40mg /d  .  GERD (gastroesophageal reflux disease)    on protonix 40mg /d  . Hyperlipidemia    on diet alone  . Lumbar back pain    s/p lumbar laminectomy 2007 by DrCabbell  . Overweight(278.02)    weight Jan10=246#.[name]..he was 225# in 9/07...diet and exercise was discussed     Social History   Socioeconomic History  . Marital status: Single    Spouse name: Not on file  . Number of children: 0  . Years of education: Not on file  . Highest education level: Not on file  Occupational History  . Occupation: [employer]   Social Needs  . Financial resource strain: Not on file  . Food insecurity    Worry: Not on file    Inability: Not on file  . Transportation needs    Medical: Not on file    Non-medical: Not on file  Tobacco Use  . Smoking status: Former Smoker    Packs/day: 0.10    Years: 8.00    Pack years: 0.80    Types: Cigarettes    Quit date: 03/03/2011    Years since quitting: 7.7  . Smokeless tobacco: Never Used  Substance and Sexual Activity  . Alcohol use: Yes    Comment: rare  . Drug use: No  . Sexual activity: Not on file  Lifestyle  . Physical activity    Days per week: Not on file    Minutes per session: Not on file  . Stress: Not  on file  Relationships  . Social Herbalist on phone: Not on file    Gets together: Not on file    Attends religious service: Not on file    Active member of club or organization: Not on file    Attends meetings of clubs or organizations: Not on file    Relationship status: Not on file  . Intimate partner violence    Fear of current or ex partner: Not on file    Emotionally abused: Not on file    Physically abused: Not on file    Forced sexual activity: Not on file  Other Topics Concern  . Not on file  Social History Narrative   3 cups caffeine a day    Past Surgical History:  Procedure Laterality Date  . LUMBAR LAMINECTOMY  2007   DrCabbell  . TEAR DUCT PROBING      Family History  Problem Relation Age of Onset  .  Allergies Mother   . Irritable bowel syndrome Mother   . Allergies Father   . Allergies Brother   . Irritable bowel syndrome Brother   . Colon cancer Neg Hx     No Known Allergies  Current Outpatient Medications on File Prior to Visit  Medication Sig Dispense Refill  . albuterol (VENTOLIN HFA) 108 (90 Base) MCG/ACT inhaler Inhale 2 puffs into the lungs every 6 (six) hours as needed for wheezing or shortness of breath. 18 g 0  . azelastine (ASTELIN) 0.1 % nasal spray Place 2 sprays into both nostrils 2 (two) times daily. Use in each nostril as directed 30 mL 12  . azithromycin (ZITHROMAX) 250 MG tablet Take 2 tablets by mouth on day 1, followed by 1 tablet by mouth daily for 4 days. 6 tablet 0  . benzonatate (TESSALON PERLES) 100 MG capsule Take 1 capsule (100 mg total) by mouth 3 (three) times daily as needed. 20 capsule 0  . Calcium-Magnesium-Zinc 500-250-12.5 MG TABS Take by mouth 3 (three) times daily.      . cetirizine (ZYRTEC ALLERGY) 10 MG tablet Take 10 mg by mouth daily as needed.     . doxycycline (VIBRA-TABS) 100 MG tablet Take 1 tablet (100 mg total) by mouth 2 (two) times daily. 14 tablet 0  . fenofibrate (TRICOR) 48 MG tablet TAKE 1 TABLET(48 MG) BY MOUTH DAILY 30 tablet 3  . fluticasone (FLONASE) 50 MCG/ACT nasal spray Place 2 sprays into both nostrils daily. 16 g 0  . fluticasone (FLONASE) 50 MCG/ACT nasal spray Place 2 sprays into both nostrils daily. 16 g 1  . HYDROcodone-homatropine (HYCODAN) 5-1.5 MG/5ML syrup Take 5 mLs by mouth every 6 (six) hours as needed. 100 mL 0  . metFORMIN (GLUCOPHAGE) 1000 MG tablet TAKE 1 TABLET(1000 MG) BY MOUTH TWICE DAILY WITH A MEAL 60 tablet 2  . montelukast (SINGULAIR) 10 MG tablet Take 1 tablet (10 mg total) by mouth at bedtime. 30 tablet 3  . OZEMPIC, 0.25 OR 0.5 MG/DOSE, 2 MG/1.5ML SOPN INJECT 0.25 MG UNDER THE SKIN ONCE A WEEK 1.5 mL 2  . pantoprazole (PROTONIX) 40 MG tablet Take 1 tablet (40 mg total) by mouth daily. 90 tablet 3    No current facility-administered medications on file prior to visit.     BP 110/72 (BP Location: Left Arm, Patient Position: Sitting, Cuff Size: Large)   Pulse 94   Temp 97.8 F (36.6 C) (Temporal)   Resp 18   Wt 249 lb 9.6 oz (113.2 kg)  SpO2 98%   BMI 34.81 kg/m       Objective:   Physical Exam   General Mental Status- Alert. General Appearance- Not in acute distress.   Skin General: Color- Normal Color. Moisture- Normal Moisture.  Neck Carotid Arteries- Normal color. Moisture- Normal Moisture. No carotid bruits. No JVD.  Chest and Lung Exam Auscultation: Breath Sounds:-Normal.  Cardiovascular Auscultation:Rythm- Regular. Murmurs & Other Heart Sounds:Auscultation of the heart reveals- No Murmurs.  Abdomen Inspection:-Inspeection Normal. Palpation/Percussion:Note:No mass. Palpation and Percussion of the abdomen reveal- Non Tender, Non Distended + BS, no rebound or guarding.  Neurologic Cranial Nerve exam:- CN III-XII intact(No nystagmus), symmetric smile. Strength:- 5/5 equal and symmetric strength both upper and lower extremities.      Assessment & Plan:  Cough that is persistent and intermittent. Considering reactive airway type cough with allergy component since finished zpack and cxr negative for infection.  Rx prednisone 4 days taper, xyzal. Flovent inhaler and continue albuterol.  Bland foods. If loose stools again turn in gastro panel.  Low sugar diet always but particularly while on prednisone.  Will get cbc, cmp, a1c and lipid panel today.  If symptoms linger despite the above may get 3rd covid test but I don't think indicated presently.  Follow up date to be determined after review of labs.  25+ minutes spent with pt today. 50% of time spent counseling pt on plan going forward.  Esperanza Richters, PA-C

## 2018-11-28 NOTE — Telephone Encounter (Signed)
Rx atrovastatin sent to pt pharmacy. 

## 2018-11-28 NOTE — Patient Instructions (Addendum)
  Cough that is persistent and intermittent. Considering reactive airway type cough with allergy component since finished zpack and cxr negative for infection.  Rx prednisone 4 days taper, xyzal. Flovent inhaler and continue albuterol.  Bland foods. If loose stools again turn in gastro panel.  Low sugar diet always but particularly while on prednisone.  Will get cbc, cmp, a1c and lipid panel today.  If symptoms linger despite the above may get 3rd covid test but I don't think indicated presently.  Follow up date to be determined after review of labs.  For joint pain ordered inflammation studies and xray rt shoulder

## 2018-12-02 LAB — ANA: Anti Nuclear Antibody (ANA): NEGATIVE

## 2018-12-02 LAB — RHEUMATOID FACTOR: Rheumatoid fact SerPl-aCnc: <14 IU/mL (ref <14)

## 2018-12-12 ENCOUNTER — Other Ambulatory Visit: Payer: Self-pay

## 2018-12-12 MED ORDER — METFORMIN HCL 1000 MG PO TABS: ORAL_TABLET | ORAL | 2 refills | Status: DC

## 2019-01-17 ENCOUNTER — Telehealth: Payer: BC Managed Care – PPO | Admitting: Physician Assistant

## 2019-01-17 DIAGNOSIS — Z20822 Contact with and (suspected) exposure to covid-19: Secondary | ICD-10-CM

## 2019-01-17 DIAGNOSIS — J111 Influenza due to unidentified influenza virus with other respiratory manifestations: Secondary | ICD-10-CM

## 2019-01-17 MED ORDER — ALBUTEROL SULFATE HFA 108 (90 BASE) MCG/ACT IN AERS: 2.0000 | INHALATION_SPRAY | RESPIRATORY_TRACT | 0 refills | Status: DC | PRN

## 2019-01-17 MED ORDER — PROMETHAZINE-DM 6.25-15 MG/5ML PO SYRP: 5.0000 mL | ORAL_SOLUTION | Freq: Four times a day (QID) | ORAL | 0 refills | Status: DC | PRN

## 2019-01-17 NOTE — Progress Notes (Signed)
E-Visit for Corona Virus Screening   Your current symptoms could be consistent with the coronavirus.  I took a minute to review your chart.  It appears that you had similar symptoms at the end of October.  If this round of symptoms is new, I would suggest getting tested for COVID-19 again.  If this is ongoing, you may need to see your primary care provider again.  In the meantime I will prescribe cough medication and albuterol as needed.  You may take Tylenol for headache or fever control.  Information about Covid testing can be found below.  If you develop severe shortness of breath you will need to be seen in person at either an urgent care or emergency department.  Many health care providers can now test patients at their office but not all are.  [place] has multiple testing sites. For information on our COVID testing locations and hours go to [email or web address]  We are enrolling you in our MyChart Home Monitoring for COVID19 . Daily you will receive a questionnaire within the MyChart website. Our COVID 19 response team will be monitoring your responses daily.  Testing Information: The COVID-19 Community Testing sites will begin testing BY APPOINTMENT ONLY.  You can schedule online at [email or web address]  If you do not have access to a smart phone or computer you may call [phone] for an appointment.  Testing Locations: Appointment schedule is 8 am to 3:30 pm at all sites  [facility] indoors at [address], [place] [id number] [facility]  indoors at [facility]Visitors Entrance, [place], [place] [id number] [place] indoors at [address], [place] [id number]  Additional testing sites in the Community:  . For [facility] Testing sites in [facility] [place]  [place]mer[place].com.auhttps://www.cvs.com/minuteclinic/covid-19-testing  . For Pop-up testing sites in [place]  https://morgan-vargas.com/https://covid19.ncdhhs.gov/about-covid-19/testing/find-my-testing-place/pop-testing-sites  . For Testing sites with  regular hours https://[email or web address]  . For [facility] [email or web address]://[email or web address]://[email or web address]/[email or web address]-19-community-outreach-testing/  . For [facility] EternalVitamin.dkhttps://www.[place].gov/[place]-county/human-services/health-department/coronavirus-covid-19-info/covid-19-testing  . For [facility] testing in [place] and [place] EternalVitamin.dkhttps://www.[place].gov/our-county/human-services/health-department/coronavirus-covid-19-info/covid-19-testing  . For [facility] testing in [facility]   [email or web address]://lhi.care/covidtesting  For  more information about community testing call [phone]   We are enrolling you in our MyChart Home Monitoring for COVID19 . Daily you will receive a questionnaire within the MyChart website. Our COVID 19 response team will be monitoring your responses daily.  Please quarantine yourself while awaiting your test results. If you develop fever/cough/breathlessness, please stay home for 10 days with improving symptoms and until you have had 24 hours of no fever (without taking a fever reducer).  You should wear a mask or cloth face covering over your nose and mouth if you must be around other people or animals, including pets (even at home). Try to stay at least 6 feet away from other people. This will protect the people around you.  Please continue good preventive care measures, including:  frequent hand-washing, avoid touching your face, cover coughs/sneezes, stay out of crowds and keep a 6 foot distance from others.  COVID-19 is a respiratory illness with symptoms that are similar to the flu. Symptoms are typically mild to moderate, but there have been cases of severe illness and death due to the virus.   The following symptoms may appear 2-14 days after exposure: . Fever . Cough . Shortness of breath or difficulty breathing . Chills . Repeated shaking with chills . Muscle pain . Headache . Sore throat . New loss of taste or  smell . Fatigue . Congestion or runny nose . Nausea or vomiting . Diarrhea  Go to the nearest hospital ED for assessment if fever/cough/breathlessness are severe or illness seems like a threat to life.  It is vitally important that if you feel that you have an infection such as this virus or any other virus that you stay home and away from places where you may spread it to others.  You should avoid contact with people age 85 and older.   You can use medication such as A prescription inhaler called Albuterol MDI 90 mcg /actuation 2 puffs every 4 hours as needed for shortness of breath, wheezing, cough and A prescription cough medication called Phenergan DM 6.25 mg/15 mg. You make take one teaspoon / 5 ml every 4-6 hours as needed for cough  You may also take acetaminophen (Tylenol) as needed for fever.  Reduce your risk of any infection by using the same precautions used for avoiding the common cold or flu:  [name] Wash your hands often with soap and warm water for at least 20 seconds.  If soap and water are not readily available, use an alcohol-based hand sanitizer with at least 60% alcohol.  . If coughing or sneezing, cover your mouth and nose by coughing or sneezing into the elbow areas of your shirt or coat, into a tissue or into your sleeve (not your hands). . Avoid shaking hands with others and consider head nods or verbal greetings only. . Avoid touching your eyes, nose, or mouth with unwashed hands.  . Avoid close contact with people who are sick. . Avoid places or events with large numbers of people in one location, like concerts or sporting events. . Carefully consider travel plans you have or are making. . If you are planning any travel outside or inside the [place], visit the CDC's Travelers' Health webpage for the latest health notices. . If you have some symptoms but not all symptoms, continue to monitor at home and seek medical attention if your symptoms worsen. . If you are having a  medical emergency, call 911.  HOME CARE . Only take medications as instructed by your medical team. . Drink plenty of fluids and get plenty of rest. . A steam or ultrasonic humidifier can help if you have congestion.   GET HELP RIGHT AWAY IF YOU HAVE EMERGENCY WARNING SIGNS** FOR COVID-19. If you or someone is showing any of these signs seek emergency medical care immediately. Call 911 or proceed to your closest emergency facility if: . You develop worsening high fever. . Trouble breathing . Bluish lips or face . Persistent pain or pressure in the chest . New confusion . Inability to wake or stay awake . You cough up blood. . Your symptoms become more severe  **This list is not all possible symptoms. Contact your medical provider for any symptoms that are sever or concerning to you.  MAKE SURE YOU   Understand these instructions.  Will watch your condition.  Will get help right away if you are not doing well or get worse.  Your e-visit answers were reviewed by a board certified advanced clinical practitioner to complete your personal care plan.  Depending on the condition, your plan could have included both over the counter or prescription medications.  If there is a problem please reply once you have received a response from your provider.  Your safety is important to [place].  If you have drug allergies check your prescription carefully.  You can use MyChart to ask questions about today's visit, request a non-urgent call back, or ask for a work or school excuse for 24 hours related to this e-Visit. If it has been greater than 24 hours you will need to follow up with your provider, or enter a new e-Visit to address those concerns. You will get an e-mail in the next two days asking about your experience.  I hope that your e-visit has been valuable and will speed your recovery. Thank you for using e-visits.   Greater than 5 minutes, yet less than 10 minutes of time have been spent  researching, coordinating, and implementing care for this patient today

## 2019-01-20 DIAGNOSIS — Z20828 Contact with and (suspected) exposure to other viral communicable diseases: Secondary | ICD-10-CM | POA: Diagnosis not present

## 2019-01-27 ENCOUNTER — Ambulatory Visit: Payer: BC Managed Care – PPO | Attending: Internal Medicine

## 2019-01-27 DIAGNOSIS — Z20828 Contact with and (suspected) exposure to other viral communicable diseases: Secondary | ICD-10-CM | POA: Diagnosis not present

## 2019-01-27 DIAGNOSIS — Z20822 Contact with and (suspected) exposure to covid-19: Secondary | ICD-10-CM

## 2019-01-29 LAB — NOVEL CORONAVIRUS, NAA: SARS-CoV-2, NAA: NOT DETECTED

## 2019-03-22 ENCOUNTER — Other Ambulatory Visit: Payer: Self-pay | Admitting: Medical

## 2019-03-26 ENCOUNTER — Ambulatory Visit: Payer: BC Managed Care – PPO

## 2019-03-27 ENCOUNTER — Ambulatory Visit: Payer: BC Managed Care – PPO | Attending: Internal Medicine

## 2019-03-27 DIAGNOSIS — Z20822 Contact with and (suspected) exposure to covid-19: Secondary | ICD-10-CM

## 2019-03-28 LAB — NOVEL CORONAVIRUS, NAA: SARS-CoV-2, NAA: NOT DETECTED

## 2019-07-22 ENCOUNTER — Ambulatory Visit (INDEPENDENT_AMBULATORY_CARE_PROVIDER_SITE_OTHER): Payer: BC Managed Care – PPO | Admitting: Medical

## 2019-07-22 ENCOUNTER — Other Ambulatory Visit: Payer: Self-pay

## 2019-07-22 ENCOUNTER — Encounter: Payer: Self-pay | Admitting: Medical

## 2019-07-22 VITALS — BP 130/80 | HR 106 | Resp 18 | Ht 71.0 in | Wt 244.0 lb

## 2019-07-22 DIAGNOSIS — R202 Paresthesia of skin: Secondary | ICD-10-CM | POA: Diagnosis not present

## 2019-07-22 MED ORDER — FAMCICLOVIR 500 MG PO TABS: 500.0000 mg | ORAL_TABLET | Freq: Three times a day (TID) | ORAL | 0 refills | Status: DC

## 2019-07-22 MED ORDER — FAMCICLOVIR 500 MG PO TABS
500.0000 mg | ORAL_TABLET | Freq: Three times a day (TID) | ORAL | 0 refills | Status: DC
Start: 2019-07-22 — End: 2019-07-22

## 2019-07-22 NOTE — Progress Notes (Signed)
Subjective:    Patient ID: Anthony Russell, male    DOB: April 21, 1980, 39 y.o.   MRN: [id number]  HPI  Pt in for rt lower extremity burning and slight leg pain. Pt states he had slight warm sensation Saturday before last. Ever since then has intermittent mild warmth to lateral rt calf area. No pain in lower back. No muscle weakness or lack of coordination.   Burning sensation has occurred daily about twice and last for about 2 seconds but before lasted 5 seconds.  Pt states remote hx of lower back pain, herniation and surgery as well. That was 15 years ago.  Occasional rare soreness and twinge to back.   Review of Systems  Constitutional: Negative for chills, fatigue and fever.  Respiratory: Negative for cough, chest tightness, shortness of breath and wheezing.   Cardiovascular: Negative for chest pain.  Gastrointestinal: Negative for abdominal pain.  Musculoskeletal: Negative for back pain and neck pain.  Skin: Negative for rash.       Last time had warm sensation was yesterday.  Hematological: Negative for adenopathy. Does not bruise/bleed easily.  Psychiatric/Behavioral: Negative for behavioral problems. The patient is not nervous/anxious.     Past Medical History:  Diagnosis Date  . Allergy   . Allergy, unspecified not elsewhere classified    rx w/ OTC antihistamines PRN  . Anxiety   . Asthma   . Atypical chest pain    recent neg eval w/ normal cxr/ekg  . Depression   . Dyspepsia    on protonix 40mg /d  . GERD (gastroesophageal reflux disease)    on protonix 40mg /d  . Hyperlipidemia    on diet alone  . Lumbar back pain    s/p lumbar laminectomy 2007 by DrCabbell  . Overweight(278.02)    weight Jan10=246#. .he was 225# in 9/07...diet and exercise was discussed     Social History   Socioeconomic History  . Marital status: Single    Spouse name: Not on file  . Number of children: 0  . Years of education: Not on file  . Highest education level: Not on file    Occupational History  . Occupation: software   Tobacco Use  . Smoking status: Former Smoker    Packs/day: 0.10    Years: 8.00    Pack years: 0.80    Types: Cigarettes    Quit date: 03/03/2011    Years since quitting: 8.3  . Smokeless tobacco: Never Used  Vaping Use  . Vaping Use: Never used  Substance and Sexual Activity  . Alcohol use: Yes    Comment: rare  . Drug use: No  . Sexual activity: Not on file  Other Topics Concern  . Not on file  Social History Narrative   3 cups caffeine a day   Social Determinants of Health   Financial Resource Strain:   . Difficulty of Paying Living Expenses:   Food Insecurity:   . Worried About 11/07 in the Last Year:   . 05/01/2011 in the Last Year:   Transportation Needs:   . Programme researcher, broadcasting/film/video (Medical):   Barista Lack of Transportation (Non-Medical):   Physical Activity:   . Days of Exercise per Week:   . Minutes of Exercise per Session:   Stress:   . Feeling of Stress :   Social Connections:   . Frequency of Communication with Friends and Family:   . Frequency of Social Gatherings with Friends and Family:   .  Attends Religious Services:   . Active Member of Clubs or Organizations:   . Attends Archivist Meetings:   [name] Marital Status:   Intimate Partner Violence:   . Fear of Current or Ex-Partner:   . Emotionally Abused:   [name] Physically Abused:   . Sexually Abused:     Past Surgical History:  Procedure Laterality Date  . LUMBAR LAMINECTOMY  2007   DrCabbell  . TEAR DUCT PROBING      Family History  Problem Relation Age of Onset  . Allergies Mother   . Irritable bowel syndrome Mother   . Allergies Father   . Allergies Brother   . Irritable bowel syndrome Brother   . Colon cancer Neg Hx     No Known Allergies  Current Outpatient Medications on File Prior to Visit  Medication Sig Dispense Refill  . albuterol (VENTOLIN HFA) 108 (90 Base) MCG/ACT inhaler Inhale 2 puffs into the lungs  every 6 (six) hours as needed for wheezing or shortness of breath. 18 g 0  . albuterol (VENTOLIN HFA) 108 (90 Base) MCG/ACT inhaler Inhale 2 puffs into the lungs every 2 (two) hours as needed for wheezing or shortness of breath (cough). (Patient not taking: Reported on 07/22/2019) 8 g 0  . atorvastatin (LIPITOR) 10 MG tablet Take 1 tablet (10 mg total) by mouth daily. (Patient not taking: Reported on 07/22/2019) 30 tablet 3  . azelastine (ASTELIN) 0.1 % nasal spray Place 2 sprays into both nostrils 2 (two) times daily. Use in each nostril as directed (Patient not taking: Reported on 07/22/2019) 30 mL 12  . azithromycin (ZITHROMAX) 250 MG tablet Take 2 tablets by mouth on day 1, followed by 1 tablet by mouth daily for 4 days. (Patient not taking: Reported on 07/22/2019) 6 tablet 0  . benzonatate (TESSALON PERLES) 100 MG capsule Take 1 capsule (100 mg total) by mouth 3 (three) times daily as needed. 20 capsule 0  . Calcium-Magnesium-Zinc 500-250-12.5 MG TABS Take by mouth 3 (three) times daily.      . cetirizine (ZYRTEC ALLERGY) 10 MG tablet Take 10 mg by mouth daily as needed.     . doxycycline (VIBRA-TABS) 100 MG tablet Take 1 tablet (100 mg total) by mouth 2 (two) times daily. (Patient not taking: Reported on 07/22/2019) 14 tablet 0  . fenofibrate (TRICOR) 48 MG tablet TAKE 1 TABLET(48 MG) BY MOUTH DAILY 30 tablet 3  . fluticasone (FLONASE) 50 MCG/ACT nasal spray Place 2 sprays into both nostrils daily. (Patient not taking: Reported on 07/22/2019) 16 g 0  . fluticasone (FLONASE) 50 MCG/ACT nasal spray Place 2 sprays into both nostrils daily. (Patient not taking: Reported on 07/22/2019) 16 g 1  . fluticasone (FLOVENT HFA) 110 MCG/ACT inhaler Inhale 2 puffs into the lungs 2 (two) times daily. (Patient not taking: Reported on 07/22/2019) 1 Inhaler 3  . HYDROcodone-homatropine (HYCODAN) 5-1.5 MG/5ML syrup Take 5 mLs by mouth every 6 (six) hours as needed. (Patient not taking: Reported on 07/22/2019) 100 mL 0  .  levocetirizine (XYZAL) 5 MG tablet Take 1 tablet (5 mg total) by mouth every evening. (Patient not taking: Reported on 07/22/2019) 30 tablet 3  . metFORMIN (GLUCOPHAGE) 1000 MG tablet TAKE 1 TABLET(1000 MG) BY MOUTH TWICE DAILY WITH A MEAL 60 tablet 2  . montelukast (SINGULAIR) 10 MG tablet Take 1 tablet (10 mg total) by mouth at bedtime. (Patient not taking: Reported on 07/22/2019) 30 tablet 3  . OZEMPIC, 0.25 OR 0.5 MG/DOSE, 2 MG/1.5ML  SOPN INJECT 0.25 MG UNDER THE SKIN ONCE A WEEK (Patient not taking: Reported on 07/22/2019) 1.5 mL 2  . pantoprazole (PROTONIX) 40 MG tablet Take 1 tablet (40 mg total) by mouth daily. 90 tablet 3  . predniSONE (DELTASONE) 10 MG tablet 4 tab po day 1, 3 tab po day 2, 2 tab po day 3, 1 tab po day 4 (Patient not taking: Reported on 07/22/2019) 10 tablet 0  . promethazine-dextromethorphan (PROMETHAZINE-DM) 6.25-15 MG/5ML syrup Take 5 mLs by mouth 4 (four) times daily as needed for cough. (Patient not taking: Reported on 07/22/2019) 118 mL 0   No current facility-administered medications on file prior to visit.    BP (!) 154/76 (BP Location: Right Arm, Patient Position: Sitting, Cuff Size: Large)   Pulse (!) 106   Resp 18   Ht 5\' 11"  (1.803 m)   Wt 244 lb (110.7 kg)   SpO2 100%   BMI 34.03 kg/m       Objective:   Physical Exam  General Mental Status- Alert. General Appearance- Not in acute distress.   Skin General: Color- Normal Color. Moisture- Normal Moisture. No rash to lower ext rt leg.  Neck Carotid Arteries- Normal color. Moisture- Normal Moisture. No carotid bruits. No JVD.  Chest and Lung Exam Auscultation: Breath Sounds:-Normal.  Cardiovascular Auscultation:Rythm- Regular. Murmurs & Other Heart Sounds:Auscultation of the heart reveals- No Murmurs.  Abdomen Inspection:-Inspeection Normal. Palpation/Percussion:Note:No mass. Palpation and Percussion of the abdomen reveal- Non Tender, Non Distended + BS, no rebound or  guarding.   Neurologic Cranial Nerve exam:- CN III-XII intact(No nystagmus), symmetric smile. Strength:- 5/5 equal and symmetric strength both upper and lower extremities.  Back- no mid lumbar area pain on palpation.     Assessment & Plan:  You do have paresthesia type pain rt lower ext with no obvious cause presently. Cause undertemined. If you get worse symptoms or any blister eruption then start print rx famvir as shingles sometimes presently slowly/subtly.   If any back pain let me know as will get xray l-spine due to your history.  Follow up in 2-3 weeks or as needed. If signs/symptoms resolved completely let me know.  If any deep calf region pain then get xray tibia/fibula  , PA-C   Time spent with patient today was 22  minutes which consisted of chart review, discussing diagnosis, work up treatment and documentation.

## 2019-07-22 NOTE — Patient Instructions (Addendum)
You do have paresthesia type pain rt lower ext with no obvious cause presently. Cause undertemined. If you get worse symptoms or any blister eruption then start print rx famvir as shingles sometimes presently slowly/subtly.   If any back pain let me know as will get xray l-spine due to your history.  Follow up in 2-3 weeks or as needed. If signs/symptoms resolved completely let me know.  If any deep calf region pain then get xray tibia/fibula

## 2019-08-14 ENCOUNTER — Other Ambulatory Visit: Payer: Self-pay

## 2019-08-14 ENCOUNTER — Ambulatory Visit (INDEPENDENT_AMBULATORY_CARE_PROVIDER_SITE_OTHER): Payer: BC Managed Care – PPO | Admitting: Medical

## 2019-08-14 VITALS — BP 132/82 | HR 96 | Resp 18 | Ht 71.0 in | Wt 237.0 lb

## 2019-08-14 DIAGNOSIS — E119 Type 2 diabetes mellitus without complications: Secondary | ICD-10-CM

## 2019-08-14 DIAGNOSIS — Z113 Encounter for screening for infections with a predominantly sexual mode of transmission: Secondary | ICD-10-CM

## 2019-08-14 DIAGNOSIS — Z0000 Encounter for general adult medical examination without abnormal findings: Secondary | ICD-10-CM

## 2019-08-14 LAB — CBC WITH DIFFERENTIAL/PLATELET
Basophils Absolute: 0 10*3/uL (ref 0.0–0.1)
Basophils Relative: 0.5 % (ref 0.0–3.0)
Eosinophils Absolute: 0.1 10*3/uL (ref 0.0–0.7)
Eosinophils Relative: 0.8 % (ref 0.0–5.0)
HCT: 44.2 % (ref 39.0–52.0)
Hemoglobin: 14.8 g/dL (ref 13.0–17.0)
Lymphocytes Relative: 25.1 % (ref 12.0–46.0)
Lymphs Abs: 1.7 10*3/uL (ref 0.7–4.0)
MCHC: 33.6 g/dL (ref 30.0–36.0)
MCV: 90.6 fl (ref 78.0–100.0)
Monocytes Absolute: 0.5 10*3/uL (ref 0.1–1.0)
Monocytes Relative: 6.7 % (ref 3.0–12.0)
Neutro Abs: 4.5 10*3/uL (ref 1.4–7.7)
Neutrophils Relative %: 66.9 % (ref 43.0–77.0)
Platelets: 171 10*3/uL (ref 150.0–400.0)
RBC: 4.88 Mil/uL (ref 4.22–5.81)
RDW: 14.7 % (ref 11.5–15.5)
WBC: 6.8 10*3/uL (ref 4.0–10.5)

## 2019-08-14 LAB — COMPREHENSIVE METABOLIC PANEL
ALT: 68 U/L — ABNORMAL HIGH (ref 0–53)
AST: 32 U/L (ref 0–37)
Albumin: 4.5 g/dL (ref 3.5–5.2)
Alkaline Phosphatase: 54 U/L (ref 39–117)
BUN: 12 mg/dL (ref 6–23)
CO2: 30 mEq/L (ref 19–32)
Calcium: 9.8 mg/dL (ref 8.4–10.5)
Chloride: 105 mEq/L (ref 96–112)
Creatinine, Ser: 0.98 mg/dL (ref 0.40–1.50)
GFR: 85.18 mL/min (ref >60.00)
Glucose, Bld: 115 mg/dL — ABNORMAL HIGH (ref 70–99)
Potassium: 4.4 mEq/L (ref 3.5–5.1)
Sodium: 143 mEq/L (ref 135–145)
Total Bilirubin: 0.5 mg/dL (ref 0.2–1.2)
Total Protein: 6.9 g/dL (ref 6.0–8.3)

## 2019-08-14 LAB — LDL CHOLESTEROL, DIRECT: Direct LDL: 132 mg/dL

## 2019-08-14 LAB — LIPID PANEL
Cholesterol: 202 mg/dL — ABNORMAL HIGH (ref 0–200)
HDL: 28.9 mg/dL — ABNORMAL LOW (ref >39.00)
NonHDL: 173.14
Total CHOL/HDL Ratio: 7
Triglycerides: 226 mg/dL — ABNORMAL HIGH (ref 0.0–149.0)
VLDL: 45.2 mg/dL — ABNORMAL HIGH (ref 0.0–40.0)

## 2019-08-14 LAB — HEMOGLOBIN A1C: Hgb A1c MFr Bld: 8 % — ABNORMAL HIGH (ref 4.6–6.5)

## 2019-08-14 NOTE — Patient Instructions (Addendum)
For you wellness exam today I have ordered cbc, cmp, a1c, lipid panel, and hiv.  Vaccine appear up to date and got covid vaccine  Recommend exercise and healthy diet.  We will let you know lab results as they come in.  Follow up date appointment will be determined after lab review.    Preventive Care 39-39 Years Old, Male Preventive care refers to lifestyle choices and visits with your health care provider that can promote health and wellness. This includes:  A yearly physical exam. This is also called an annual well check.  Regular dental and eye exams.  Immunizations.  Screening for certain conditions.  Healthy lifestyle choices, such as eating a healthy diet, getting regular exercise, not using drugs or products that contain nicotine and tobacco, and limiting alcohol use. What can I expect for my preventive care visit? Physical exam Your health care provider will check:  Height and weight. These may be used to calculate body mass index (BMI), which is a measurement that tells if you are at a healthy weight.  Heart rate and blood pressure.  Your skin for abnormal spots. Counseling Your health care provider may ask you questions about:  Alcohol, tobacco, and drug use.  Emotional well-being.  Home and relationship well-being.  Sexual activity.  Eating habits.  Work and work Statistician. What immunizations do I need?  Influenza (flu) vaccine  This is recommended every year. Tetanus, diphtheria, and pertussis (Tdap) vaccine  You may need a Td booster every 10 years. Varicella (chickenpox) vaccine  You may need this vaccine if you have not already been vaccinated. Human papillomavirus (HPV) vaccine  If recommended by your health care provider, you may need three doses over 6 months. Measles, mumps, and rubella (MMR) vaccine  You may need at least one dose of MMR. You may also need a second dose. Meningococcal conjugate (MenACWY) vaccine  One dose is  recommended if you are 39-73 years old and a Market researcher living in a residence hall, or if you have one of several medical conditions. You may also need additional booster doses. Pneumococcal conjugate (PCV13) vaccine  You may need this if you have certain conditions and were not previously vaccinated. Pneumococcal polysaccharide (PPSV23) vaccine  You may need one or two doses if you smoke cigarettes or if you have certain conditions. Hepatitis A vaccine  You may need this if you have certain conditions or if you travel or work in places where you may be exposed to hepatitis A. Hepatitis B vaccine  You may need this if you have certain conditions or if you travel or work in places where you may be exposed to hepatitis B. Haemophilus influenzae type b (Hib) vaccine  You may need this if you have certain risk factors. You may receive vaccines as individual doses or as more than one vaccine together in one shot (combination vaccines). Talk with your health care provider about the risks and benefits of combination vaccines. What tests do I need? Blood tests  Lipid and cholesterol levels. These may be checked every 5 years starting at age 39.  Hepatitis C test.  Hepatitis B test. Screening   Diabetes screening. This is done by checking your blood sugar (glucose) after you have not eaten for a while (fasting).  Sexually transmitted disease (STD) testing. Talk with your health care provider about your test results, treatment options, and if necessary, the need for more tests. Follow these instructions at home: Eating and drinking   Eat  a diet that includes fresh fruits and vegetables, whole grains, lean protein, and low-fat dairy products.  Take vitamin and mineral supplements as recommended by your health care provider.  Do not drink alcohol if your health care provider tells you not to drink.  If you drink alcohol: ? Limit how much you have to 0-2 drinks a  day. ? Be aware of how much alcohol is in your drink. In the [place] one drink equals one 12 oz bottle of beer (355 mL), one 5 oz glass of wine (148 mL), or one 1 oz glass of hard liquor (44 mL). Lifestyle  Take daily care of your teeth and gums.  Stay active. Exercise for at least 30 minutes on 5 or more days each week.  Do not use any products that contain nicotine or tobacco, such as cigarettes, e-cigarettes, and chewing tobacco. If you need help quitting, ask your health care provider.  If you are sexually active, practice safe sex. Use a condom or other form of protection to prevent STIs (sexually transmitted infections). What's next?  Go to your health care provider once a year for a well check visit.  Ask your health care provider how often you should have your eyes and teeth checked.  Stay up to date on all vaccines. This information is not intended to replace advice given to you by your health care provider. Make sure you discuss any questions you have with your health care provider. Document Revised: 01/10/2018 Document Reviewed: 01/10/2018 Elsevier Patient Education  2020 [employer].

## 2019-08-14 NOTE — Progress Notes (Signed)
Subjective:    Patient ID: Anthony Russell, male    DOB: [birth year], 39 y.o.   MRN: [id number]  HPI  Pt has been exercising. Has started going to gym 3 days a week. Pt diet is much better. Less carbs. Now only one soda week and less carbs. Rare alcohol use. Nonsmoker.  Pt is fasting. He is diabetic. No recent a1c.   Review of Systems  Constitutional: Negative for chills, fatigue and fever.  Respiratory: Negative for cough, chest tightness, shortness of breath and wheezing.   Cardiovascular: Negative for chest pain and palpitations.  Gastrointestinal: Negative for abdominal pain and blood in stool.  Musculoskeletal: Negative for back pain.  Skin: Negative for rash.  Neurological: Negative for dizziness, seizures, syncope, weakness and headaches.  Hematological: Negative for adenopathy. Does not bruise/bleed easily.  Psychiatric/Behavioral: Negative for behavioral problems.    Past Medical History:  Diagnosis Date  . Allergy   . Allergy, unspecified not elsewhere classified    rx w/ OTC antihistamines PRN  . Anxiety   . Asthma   . Atypical chest pain    recent neg eval w/ normal cxr/ekg  . Depression   . Dyspepsia    on protonix 40mg /d  . GERD (gastroesophageal reflux disease)    on protonix 40mg /d  . Hyperlipidemia    on diet alone  . Lumbar back pain    s/p lumbar laminectomy 2007 by DrCabbell  . Overweight(278.02)    weight Jan10=246#. .he was 225# in 9/07...diet and exercise was discussed     Social History   Socioeconomic History  . Marital status: Single    Spouse name: Not on file  . Number of children: 0  . Years of education: Not on file  . Highest education level: Not on file  Occupational History  . Occupation: software   Tobacco Use  . Smoking status: Former Smoker    Packs/day: 0.10    Years: 8.00    Pack years: 0.80    Types: Cigarettes    Quit date: 03/03/2011    Years since quitting: 8.4  . Smokeless tobacco: Never Used  Vaping Use  .  Vaping Use: Never used  Substance and Sexual Activity  . Alcohol use: Yes    Comment: rare  . Drug use: No  . Sexual activity: Not on file  Other Topics Concern  . Not on file  Social History Narrative   3 cups caffeine a day   Social Determinants of Health   Financial Resource Strain:   . Difficulty of Paying Living Expenses:   Food Insecurity:   . Worried About 11/07 in the Last Year:   . 05/01/2011 in the Last Year:   Transportation Needs:   . Programme researcher, broadcasting/film/video (Medical):   Barista Lack of Transportation (Non-Medical):   Physical Activity:   . Days of Exercise per Week:   . Minutes of Exercise per Session:   Stress:   . Feeling of Stress :   Social Connections:   . Frequency of Communication with Friends and Family:   . Frequency of Social Gatherings with Friends and Family:   . Attends Religious Services:   . Active Member of Clubs or Organizations:   . Attends Freight forwarder Meetings:   [name] Marital Status:   Intimate Partner Violence:   . Fear of Current or Ex-Partner:   . Emotionally Abused:   Banker Physically Abused:   . Sexually Abused:  Past Surgical History:  Procedure Laterality Date  . LUMBAR LAMINECTOMY  2007   DrCabbell  . TEAR DUCT PROBING      Family History  Problem Relation Age of Onset  . Allergies Mother   . Irritable bowel syndrome Mother   . Allergies Father   . Allergies Brother   . Irritable bowel syndrome Brother   . Colon cancer Neg Hx     No Known Allergies  Current Outpatient Medications on File Prior to Visit  Medication Sig Dispense Refill  . albuterol (VENTOLIN HFA) 108 (90 Base) MCG/ACT inhaler Inhale 2 puffs into the lungs every 6 (six) hours as needed for wheezing or shortness of breath. 18 g 0  . benzonatate (TESSALON PERLES) 100 MG capsule Take 1 capsule (100 mg total) by mouth 3 (three) times daily as needed. 20 capsule 0  . Calcium-Magnesium-Zinc 500-250-12.5 MG TABS Take by mouth 3 (three) times  daily.      . cetirizine (ZYRTEC ALLERGY) 10 MG tablet Take 10 mg by mouth daily as needed.     . famciclovir (FAMVIR) 500 MG tablet Take 1 tablet (500 mg total) by mouth 3 (three) times daily. 21 tablet 0  . fenofibrate (TRICOR) 48 MG tablet TAKE 1 TABLET(48 MG) BY MOUTH DAILY 30 tablet 3  . fluticasone (FLONASE) 50 MCG/ACT nasal spray Place 2 sprays into both nostrils daily. 16 g 0  . metFORMIN (GLUCOPHAGE) 1000 MG tablet TAKE 1 TABLET(1000 MG) BY MOUTH TWICE DAILY WITH A MEAL 60 tablet 2  . pantoprazole (PROTONIX) 40 MG tablet Take 1 tablet (40 mg total) by mouth daily. 90 tablet 3  . albuterol (VENTOLIN HFA) 108 (90 Base) MCG/ACT inhaler Inhale 2 puffs into the lungs every 2 (two) hours as needed for wheezing or shortness of breath (cough). (Patient not taking: Reported on 07/22/2019) 8 g 0  . atorvastatin (LIPITOR) 10 MG tablet Take 1 tablet (10 mg total) by mouth daily. (Patient not taking: Reported on 07/22/2019) 30 tablet 3  . azelastine (ASTELIN) 0.1 % nasal spray Place 2 sprays into both nostrils 2 (two) times daily. Use in each nostril as directed (Patient not taking: Reported on 07/22/2019) 30 mL 12  . azithromycin (ZITHROMAX) 250 MG tablet Take 2 tablets by mouth on day 1, followed by 1 tablet by mouth daily for 4 days. (Patient not taking: Reported on 07/22/2019) 6 tablet 0  . doxycycline (VIBRA-TABS) 100 MG tablet Take 1 tablet (100 mg total) by mouth 2 (two) times daily. (Patient not taking: Reported on 07/22/2019) 14 tablet 0  . fluticasone (FLONASE) 50 MCG/ACT nasal spray Place 2 sprays into both nostrils daily. (Patient not taking: Reported on 07/22/2019) 16 g 1  . fluticasone (FLOVENT HFA) 110 MCG/ACT inhaler Inhale 2 puffs into the lungs 2 (two) times daily. (Patient not taking: Reported on 07/22/2019) 1 Inhaler 3  . HYDROcodone-homatropine (HYCODAN) 5-1.5 MG/5ML syrup Take 5 mLs by mouth every 6 (six) hours as needed. (Patient not taking: Reported on 07/22/2019) 100 mL 0  .  levocetirizine (XYZAL) 5 MG tablet Take 1 tablet (5 mg total) by mouth every evening. (Patient not taking: Reported on 07/22/2019) 30 tablet 3  . montelukast (SINGULAIR) 10 MG tablet Take 1 tablet (10 mg total) by mouth at bedtime. (Patient not taking: Reported on 07/22/2019) 30 tablet 3  . OZEMPIC, 0.25 OR 0.5 MG/DOSE, 2 MG/1.5ML SOPN INJECT 0.25 MG UNDER THE SKIN ONCE A WEEK (Patient not taking: Reported on 07/22/2019) 1.5 mL 2  . predniSONE (  DELTASONE) 10 MG tablet 4 tab po day 1, 3 tab po day 2, 2 tab po day 3, 1 tab po day 4 (Patient not taking: Reported on 07/22/2019) 10 tablet 0  . promethazine-dextromethorphan (PROMETHAZINE-DM) 6.25-15 MG/5ML syrup Take 5 mLs by mouth 4 (four) times daily as needed for cough. (Patient not taking: Reported on 07/22/2019) 118 mL 0   No current facility-administered medications on file prior to visit.    BP 132/82 (BP Location: Left Arm, Patient Position: Sitting, Cuff Size: Large)   Pulse 96   Resp 18   Ht 5\' 11"  (1.803 m)   Wt 237 lb (107.5 kg)   SpO2 96%   BMI 33.05 kg/m       Objective:   Physical Exam   General Mental Status- Alert. General Appearance- Not in acute distress.   Skin General: Color- Normal Color. Moisture- Normal Moisture.  Neck Carotid Arteries- Normal color. Moisture- Normal Moisture. No carotid bruits. No JVD.  Chest and Lung Exam Auscultation: Breath Sounds:-Normal.  Cardiovascular Auscultation:Rythm- Regular. Murmurs & Other Heart Sounds:Auscultation of the heart reveals- No Murmurs.  Abdomen Inspection:-Inspeection Normal. Palpation/Percussion:Note:No mass. Palpation and Percussion of the abdomen reveal- Non Tender, Non Distended + BS, no rebound or guarding.    Neurologic Cranial Nerve exam:- CN III-XII intact(No nystagmus), symmetric smile. Strength:- 5/5 equal and symmetric strength both upper and lower extremities.     Assessment & Plan:  For you wellness exam today I have ordered cbc, cmp, a1c, lipid  panel, and hiv.  Vaccine appear up to date and got covid vaccine  Recommend exercise and healthy diet.  We will let you know lab results as they come in.  Follow up date appointment will be determined after lab review.   , PA-C

## 2019-08-15 LAB — HIV ANTIBODY (ROUTINE TESTING W REFLEX): HIV 1&2 Ab, 4th Generation: NONREACTIVE

## 2019-08-16 ENCOUNTER — Telehealth: Payer: Self-pay | Admitting: Medical

## 2019-08-16 MED ORDER — ATORVASTATIN CALCIUM 10 MG PO TABS: 10.0000 mg | ORAL_TABLET | Freq: Every day | ORAL | 11 refills | Status: DC

## 2019-08-16 MED ORDER — METFORMIN HCL 1000 MG PO TABS: ORAL_TABLET | ORAL | 5 refills | Status: DC

## 2019-08-16 NOTE — Telephone Encounter (Signed)
Rx metformin and atorvastatin sent to pt pharmacy. 

## 2019-08-28 DIAGNOSIS — Z20822 Contact with and (suspected) exposure to covid-19: Secondary | ICD-10-CM | POA: Diagnosis not present

## 2019-09-04 ENCOUNTER — Telehealth: Payer: Self-pay | Admitting: Medical

## 2019-09-04 NOTE — Telephone Encounter (Signed)
Pt has conscious sedation consent form for me to approve. I want to know how his sugars have been running, what  dental procedure is he getting done.   Need to know this before filling out form. Also when is procedure.  I won't be in office next week so please call pt tomorrow and notify me.   If prolonged complicated procedure might need preo-op exam.

## 2019-09-05 NOTE — Telephone Encounter (Signed)
Procedure is on 8/19 and sugars have been running around 120's

## 2019-09-05 NOTE — Telephone Encounter (Signed)
Procedure type?

## 2019-09-05 NOTE — Telephone Encounter (Signed)
4 implants & time frame roughly 2 hours

## 2019-09-05 NOTE — Telephone Encounter (Signed)
Started form. Plan to finish on Monday when back from vacation.

## 2019-09-24 DIAGNOSIS — M545 Low back pain: Secondary | ICD-10-CM | POA: Diagnosis not present

## 2019-10-08 DIAGNOSIS — M545 Low back pain: Secondary | ICD-10-CM | POA: Diagnosis not present

## 2019-10-22 DIAGNOSIS — M545 Low back pain: Secondary | ICD-10-CM | POA: Diagnosis not present

## 2019-10-26 DIAGNOSIS — E118 Type 2 diabetes mellitus with unspecified complications: Secondary | ICD-10-CM | POA: Diagnosis not present

## 2019-11-03 DIAGNOSIS — Z20828 Contact with and (suspected) exposure to other viral communicable diseases: Secondary | ICD-10-CM | POA: Diagnosis not present

## 2019-11-18 ENCOUNTER — Telehealth (INDEPENDENT_AMBULATORY_CARE_PROVIDER_SITE_OTHER): Payer: BC Managed Care – PPO | Admitting: Medical

## 2019-11-18 DIAGNOSIS — R0981 Nasal congestion: Secondary | ICD-10-CM | POA: Diagnosis not present

## 2019-11-18 DIAGNOSIS — J029 Acute pharyngitis, unspecified: Secondary | ICD-10-CM

## 2019-11-18 MED ORDER — AZITHROMYCIN 250 MG PO TABS
ORAL_TABLET | ORAL | 0 refills | Status: DC
Start: 2019-11-18 — End: 2020-08-16

## 2019-11-18 NOTE — Patient Instructions (Signed)
Recent onset of sore throat and some postnasal drainage.  Exam today was virtual visit so some disadvantage in making diagnosis review.  Throat did look mildly red and swollen.  Also you note slight swelling of left submandibular node region.  Also you have history of allergies around this time a year.  I decided to prescribe both azithromycin antibiotic as well as allergy meds flonase nasal spray and Xyzal antihistamine.  Rest, hydrate and take meds.  If symptoms persist or worsen as we approach weekend please let me know.  Prior Covid test negative but if your symptoms did worsen would recommend getting repeat.  Follow-up in 7 to 10 days or as needed.

## 2019-11-18 NOTE — Progress Notes (Signed)
Subjective:    Patient ID: Anthony Russell, male    DOB: [birth year], 39 y.o.   MRN: [id number]  HPI  Virtual Visit via Video Note  I connected with [name] on 11/18/19 at  2:20 PM EDT by a video enabled telemedicine application and verified that I am speaking with the correct person using two identifiers.  Location: Patient: home Provider: office  Participants- pt and myself.  Pt did not check, pulse or 02 sat.  Pt had covid vaccine in the past.   I discussed the limitations of evaluation and management by telemedicine and the availability of in person appointments. The patient expressed understanding and agreed to proceed.  History of Present Illness: Pt woke up this morning with dull st that continues. Pt has pain when he swollows. Mild faint swelling of submandibular lymph node on the lefts side. No neck stiffness. No ha. No muscle aches or body aches. No fever, no chills or sweats. Can smell and test. Faint mild nasal congestion. No sneezing. Maybe mild pnd.   Pt dad getting over covid. Pt is vaccinated against covid. Pt was tested for covid 2 weeks ago and result was negative.   Observations/Objective:  General-no acute distress, pleasant, oriented. Lungs- on inspection lungs appear unlabored. Neck- no tracheal deviation or jvd on inspection. Neuro- gross motor function appears intact. heent- no sinus pressure on self palpation. On video inspection throat is mild red. No exudate. Did not see tonsils well.   Assessment and Plan: Recent onset of sore throat and some postnasal drainage.  Exam today was virtual visit so some disadvantage in making diagnosis review.  Throat did look mildly red and swollen.  Also you note slight swelling of left submandibular node region.  Also you have history of allergies around this time a year.  I decided to prescribe both azithromycin antibiotic as well as allergy meds flonase nasal spray and Xyzal antihistamine.  Rest, hydrate and  take meds.  If symptoms persist or worsen as we approach weekend please let me know.  Prior Covid test negative but if your symptoms did worsen would recommend getting repeat.  Follow-up in 7 to 10 days or as needed.  Time spent with patient today was  18 minutes which consisted of chart revdiew, discussing diagnosis, work up treatment and documentation.  Follow Up Instructions:    I discussed the assessment and treatment plan with the patient. The patient was provided an opportunity to ask questions and all were answered. The patient agreed with the plan and demonstrated an understanding of the instructions.   The patient was advised to call back or seek an in-person evaluation if the symptoms worsen or if the condition fails to improve as anticipated.  Time spent with patient today was  18 minutes which consisted of chart revdiew, discussing diagnosis, work up treatment and documentation.   Esperanza Richters, PA-C   Review of Systems  Constitutional: Negative for chills, fatigue and fever.  HENT: Positive for sore throat. Negative for congestion, hearing loss, mouth sores, nosebleeds and sinus pain.   Respiratory: Negative for cough, chest tightness and wheezing.   Cardiovascular: Negative for chest pain and palpitations.  Gastrointestinal: Negative for abdominal pain, nausea and vomiting.  Musculoskeletal: Negative for back pain.  Neurological: Negative for dizziness and headaches.  Hematological: Positive for adenopathy.       See hpi.  Psychiatric/Behavioral: Negative for behavioral problems and confusion.       Objective:   Physical Exam  Assessment & Plan:

## 2019-12-03 DIAGNOSIS — M545 Low back pain, unspecified: Secondary | ICD-10-CM | POA: Diagnosis not present

## 2019-12-17 DIAGNOSIS — M545 Low back pain, unspecified: Secondary | ICD-10-CM | POA: Diagnosis not present

## 2020-04-05 ENCOUNTER — Telehealth: Payer: Self-pay | Admitting: Medical

## 2020-04-05 ENCOUNTER — Other Ambulatory Visit: Payer: Self-pay

## 2020-04-05 ENCOUNTER — Ambulatory Visit (INDEPENDENT_AMBULATORY_CARE_PROVIDER_SITE_OTHER): Payer: BC Managed Care – PPO | Admitting: Medical

## 2020-04-05 VITALS — BP 135/83 | HR 100 | Temp 98.5°F | Resp 18 | Ht 71.0 in | Wt 231.4 lb

## 2020-04-05 DIAGNOSIS — K219 Gastro-esophageal reflux disease without esophagitis: Secondary | ICD-10-CM | POA: Diagnosis not present

## 2020-04-05 DIAGNOSIS — Z23 Encounter for immunization: Secondary | ICD-10-CM

## 2020-04-05 DIAGNOSIS — E119 Type 2 diabetes mellitus without complications: Secondary | ICD-10-CM

## 2020-04-05 DIAGNOSIS — E1169 Type 2 diabetes mellitus with other specified complication: Secondary | ICD-10-CM | POA: Diagnosis not present

## 2020-04-05 DIAGNOSIS — E785 Hyperlipidemia, unspecified: Secondary | ICD-10-CM

## 2020-04-05 DIAGNOSIS — J4520 Mild intermittent asthma, uncomplicated: Secondary | ICD-10-CM | POA: Diagnosis not present

## 2020-04-05 DIAGNOSIS — J301 Allergic rhinitis due to pollen: Secondary | ICD-10-CM

## 2020-04-05 MED ORDER — LEVOCETIRIZINE DIHYDROCHLORIDE 5 MG PO TABS: 5.0000 mg | ORAL_TABLET | Freq: Every evening | ORAL | 3 refills | Status: DC

## 2020-04-05 MED ORDER — FLUTICASONE PROPIONATE 50 MCG/ACT NA SUSP: 2.0000 | Freq: Every day | NASAL | 1 refills | Status: DC

## 2020-04-05 MED ORDER — PANTOPRAZOLE SODIUM 40 MG PO TBEC: 40.0000 mg | DELAYED_RELEASE_TABLET | Freq: Every day | ORAL | 3 refills | Status: DC

## 2020-04-05 MED ORDER — ALBUTEROL SULFATE HFA 108 (90 BASE) MCG/ACT IN AERS: 2.0000 | INHALATION_SPRAY | Freq: Four times a day (QID) | RESPIRATORY_TRACT | 0 refills | Status: DC | PRN

## 2020-04-05 NOTE — Addendum Note (Signed)
Addended by: Maximino Sarin on: 04/05/2020 09:13 AM   Modules accepted: Orders
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
## 2020-04-05 NOTE — Patient Instructions (Addendum)
For diabetes will get a1c today and see if you have good control. Continue metformin. Will see if you need ozempic or other add on treatment. Pneumonia vaccine today.  For high cholesterol continue fenofibrate and check lipid panel. Consider statin use after review.  Gerd well controlled. Continue protonix and gerd diet.  For seasonal allergies xyzal and refill flonase.  For asthma history and mild cough refill albuterol.

## 2020-04-05 NOTE — Telephone Encounter (Signed)
PA started and approved via covermymeds  Key: [id number] Pantoprazole Sodium 40MG  dr tablets     Status: PA Response - Approved

## 2020-04-05 NOTE — Telephone Encounter (Signed)
Patient states he needs a prior authorization for medication prescribed this morning  pantoprazole (PROTONIX) 40 MG tablet [[id number]]    Prior Authorization # [id number]

## 2020-04-29 ENCOUNTER — Other Ambulatory Visit: Payer: Self-pay

## 2020-04-29 ENCOUNTER — Ambulatory Visit (INDEPENDENT_AMBULATORY_CARE_PROVIDER_SITE_OTHER): Payer: BC Managed Care – PPO | Admitting: Medical

## 2020-04-29 VITALS — BP 131/76 | HR 97 | Ht 71.0 in | Wt 234.4 lb

## 2020-04-29 DIAGNOSIS — E119 Type 2 diabetes mellitus without complications: Secondary | ICD-10-CM

## 2020-04-29 DIAGNOSIS — E785 Hyperlipidemia, unspecified: Secondary | ICD-10-CM | POA: Diagnosis not present

## 2020-04-29 DIAGNOSIS — M545 Low back pain, unspecified: Secondary | ICD-10-CM

## 2020-04-29 DIAGNOSIS — E1169 Type 2 diabetes mellitus with other specified complication: Secondary | ICD-10-CM

## 2020-04-29 LAB — HEMOGLOBIN A1C: Hgb A1c MFr Bld: 6.8 % — ABNORMAL HIGH (ref 4.6–6.5)

## 2020-04-29 LAB — COMPREHENSIVE METABOLIC PANEL
ALT: 27 U/L (ref 0–53)
AST: 18 U/L (ref 0–37)
Albumin: 4.2 g/dL (ref 3.5–5.2)
Alkaline Phosphatase: 52 U/L (ref 39–117)
BUN: 14 mg/dL (ref 6–23)
CO2: 33 mEq/L — ABNORMAL HIGH (ref 19–32)
Calcium: 9.8 mg/dL (ref 8.4–10.5)
Chloride: 104 mEq/L (ref 96–112)
Creatinine, Ser: 1.04 mg/dL (ref 0.40–1.50)
GFR: 90.36 mL/min (ref >60.00)
Glucose, Bld: 138 mg/dL — ABNORMAL HIGH (ref 70–99)
Potassium: 4.2 mEq/L (ref 3.5–5.1)
Sodium: 143 mEq/L (ref 135–145)
Total Bilirubin: 0.4 mg/dL (ref 0.2–1.2)
Total Protein: 6.6 g/dL (ref 6.0–8.3)

## 2020-04-29 LAB — LIPID PANEL
Cholesterol: 174 mg/dL (ref 0–200)
HDL: 30.5 mg/dL — ABNORMAL LOW (ref >39.00)
LDL Cholesterol: 106 mg/dL — ABNORMAL HIGH (ref 0–99)
NonHDL: 143.93
Total CHOL/HDL Ratio: 6
Triglycerides: 190 mg/dL — ABNORMAL HIGH (ref 0.0–149.0)
VLDL: 38 mg/dL (ref 0.0–40.0)

## 2020-04-29 MED ORDER — CYCLOBENZAPRINE HCL 10 MG PO TABS: 10.0000 mg | ORAL_TABLET | Freq: Every day | ORAL | 0 refills | Status: DC

## 2020-04-29 NOTE — Patient Instructions (Addendum)
Back pain mid lumbar but no radiating features. Recommend continue advil dual action and can use flexeril 1 tab at night.  Also can try salon pas lidocaine patch over the counter.  Back exercises as tolerated.   If pain persists or changes consider referral to sports med MD.(not already long time since onset. Almost one month so my chart me by Tuesday next week  if not improved and can go ahead and make that referral.  Follow up 10 days or as needed   Back Exercises These exercises help to make your trunk and back strong. They also help to keep the lower back flexible. Doing these exercises can help to prevent back pain or lessen existing pain.  If you have back pain, try to do these exercises 2-3 times each day or as told by your doctor.  As you get better, do the exercises once each day. Repeat the exercises more often as told by your doctor.  To stop back pain from coming back, do the exercises once each day, or as told by your doctor. Exercises Single knee to chest Do these steps 3-5 times in a row for each leg: 1. Lie on your back on a firm bed or the floor with your legs stretched out. 2. Bring one knee to your chest. 3. Grab your knee or thigh with both hands and hold them it in place. 4. Pull on your knee until you feel a gentle stretch in your lower back or buttocks. 5. Keep doing the stretch for 10-30 seconds. 6. Slowly let go of your leg and straighten it. Pelvic tilt Do these steps 5-10 times in a row: 1. Lie on your back on a firm bed or the floor with your legs stretched out. 2. Bend your knees so they point up to the ceiling. Your feet should be flat on the floor. 3. Tighten your lower belly (abdomen) muscles to press your lower back against the floor. This will make your tailbone point up to the ceiling instead of pointing down to your feet or the floor. 4. Stay in this position for 5-10 seconds while you gently tighten your muscles and breathe evenly. Cat-cow Do  these steps until your lower back bends more easily: 1. Get on your hands and knees on a firm surface. Keep your hands under your shoulders, and keep your knees under your hips. You may put padding under your knees. 2. Let your head hang down toward your chest. Tighten (contract) the muscles in your belly. Point your tailbone toward the floor so your lower back becomes rounded like the back of a cat. 3. Stay in this position for 5 seconds. 4. Slowly lift your head. Let the muscles of your belly relax. Point your tailbone up toward the ceiling so your back forms a sagging arch like the back of a cow. 5. Stay in this position for 5 seconds.   Press-ups Do these steps 5-10 times in a row: 1. Lie on your belly (face-down) on the floor. 2. Place your hands near your head, about shoulder-width apart. 3. While you keep your back relaxed and keep your hips on the floor, slowly straighten your arms to raise the top half of your body and lift your shoulders. Do not use your back muscles. You may change where you place your hands in order to make yourself more comfortable. 4. Stay in this position for 5 seconds. 5. Slowly return to lying flat on the floor.   Bridges Do these  steps 10 times in a row: 1. Lie on your back on a firm surface. 2. Bend your knees so they point up to the ceiling. Your feet should be flat on the floor. Your arms should be flat at your sides, next to your body. 3. Tighten your butt muscles and lift your butt off the floor until your waist is almost as high as your knees. If you do not feel the muscles working in your butt and the back of your thighs, slide your feet 1-2 inches farther away from your butt. 4. Stay in this position for 3-5 seconds. 5. Slowly lower your butt to the floor, and let your butt muscles relax. If this exercise is too easy, try doing it with your arms crossed over your chest.   Belly crunches Do these steps 5-10 times in a row: 1. Lie on your back on a firm  bed or the floor with your legs stretched out. 2. Bend your knees so they point up to the ceiling. Your feet should be flat on the floor. 3. Cross your arms over your chest. 4. Tip your chin a little bit toward your chest but do not bend your neck. 5. Tighten your belly muscles and slowly raise your chest just enough to lift your shoulder blades a tiny bit off of the floor. Avoid raising your body higher than that, because it can put too much stress on your low back. 6. Slowly lower your chest and your head to the floor. Back lifts Do these steps 5-10 times in a row: 1. Lie on your belly (face-down) with your arms at your sides, and rest your forehead on the floor. 2. Tighten the muscles in your legs and your butt. 3. Slowly lift your chest off of the floor while you keep your hips on the floor. Keep the back of your head in line with the curve in your back. Look at the floor while you do this. 4. Stay in this position for 3-5 seconds. 5. Slowly lower your chest and your face to the floor. Contact a doctor if:  Your back pain gets a lot worse when you do an exercise.  Your back pain does not get better 2 hours after you exercise. If you have any of these problems, stop doing the exercises. Do not do them again unless your doctor says it is okay. Get help right away if:  You have sudden, very bad back pain. If this happens, stop doing the exercises. Do not do them again unless your doctor says it is okay. This information is not intended to replace advice given to you by your health care provider. Make sure you discuss any questions you have with your health care provider. Document Revised: 10/11/2017 Document Reviewed: 10/11/2017 Elsevier Patient Education  2021 [employer].
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
## 2020-04-29 NOTE — Addendum Note (Signed)
Addended by: Rosita Kea on: 04/29/2020 08:36 AM   Modules accepted: Orders

## 2020-04-29 NOTE — Addendum Note (Signed)
Addended by: Rosita Kea on: 04/29/2020 08:37 AM   Modules accepted: Orders

## 2020-04-30 LAB — MICROALBUMIN, URINE: Microalb, Ur: 0.4 mg/dL

## 2020-05-06 ENCOUNTER — Telehealth: Payer: Self-pay | Admitting: Medical

## 2020-05-06 ENCOUNTER — Encounter: Payer: Self-pay | Admitting: Medical

## 2020-05-06 DIAGNOSIS — M545 Low back pain, unspecified: Secondary | ICD-10-CM

## 2020-05-06 NOTE — Telephone Encounter (Signed)
Referral to sports med placed.

## 2020-05-17 ENCOUNTER — Ambulatory Visit (INDEPENDENT_AMBULATORY_CARE_PROVIDER_SITE_OTHER): Payer: BC Managed Care – PPO | Admitting: Family Medicine

## 2020-05-17 ENCOUNTER — Ambulatory Visit (HOSPITAL_BASED_OUTPATIENT_CLINIC_OR_DEPARTMENT_OTHER)
Admission: RE | Admit: 2020-05-17 | Discharge: 2020-05-17 | Disposition: A | Discharge status: Home / Self Care | Payer: BC Managed Care – PPO | Source: Ambulatory Visit | Attending: Family Medicine | Admitting: Family Medicine

## 2020-05-17 ENCOUNTER — Other Ambulatory Visit: Payer: Self-pay

## 2020-05-17 ENCOUNTER — Encounter: Payer: Self-pay | Admitting: Family Medicine

## 2020-05-17 VITALS — BP 120/72 | Ht 71.0 in | Wt 234.0 lb

## 2020-05-17 DIAGNOSIS — M5441 Lumbago with sciatica, right side: Secondary | ICD-10-CM | POA: Diagnosis not present

## 2020-05-17 DIAGNOSIS — Z9889 Other specified postprocedural states: Secondary | ICD-10-CM | POA: Insufficient documentation

## 2020-05-17 DIAGNOSIS — M7551 Bursitis of right shoulder: Secondary | ICD-10-CM

## 2020-05-17 DIAGNOSIS — M5442 Lumbago with sciatica, left side: Secondary | ICD-10-CM | POA: Insufficient documentation

## 2020-05-17 DIAGNOSIS — M545 Low back pain, unspecified: Secondary | ICD-10-CM | POA: Diagnosis not present

## 2020-05-17 NOTE — Assessment & Plan Note (Signed)
Has long periods of time while working and sitting in the computer.  Likely component of bursitis contributing to his pain. -Counseled on home exercise therapy and supportive care. -Could consider physical therapy or injection.
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
## 2020-05-17 NOTE — Patient Instructions (Signed)
Nice to meet you Please try the exercise for the shoulder  I will call with the xray results.  Please stop by to schedule the MRI   Please send me a message in MyChart with any questions or updates.  We will schedule a virtual visit once the MRI is resulted.   --Dr. Jordan Likes

## 2020-05-19 ENCOUNTER — Telehealth: Payer: Self-pay | Admitting: Family Medicine

## 2020-05-19 NOTE — Telephone Encounter (Signed)
Informed of results.   Myra Rude, MD [facility] 05/19/2020, 9:05 AM

## 2020-05-29 ENCOUNTER — Ambulatory Visit (HOSPITAL_BASED_OUTPATIENT_CLINIC_OR_DEPARTMENT_OTHER)
Admission: RE | Admit: 2020-05-29 | Discharge: 2020-05-29 | Disposition: A | Discharge status: Home / Self Care | Payer: BC Managed Care – PPO | Source: Ambulatory Visit | Attending: Family Medicine | Admitting: Family Medicine

## 2020-05-29 ENCOUNTER — Other Ambulatory Visit: Payer: Self-pay

## 2020-05-29 DIAGNOSIS — M5441 Lumbago with sciatica, right side: Secondary | ICD-10-CM | POA: Diagnosis not present

## 2020-05-29 DIAGNOSIS — M5442 Lumbago with sciatica, left side: Secondary | ICD-10-CM | POA: Insufficient documentation

## 2020-05-29 DIAGNOSIS — Z9889 Other specified postprocedural states: Secondary | ICD-10-CM | POA: Insufficient documentation

## 2020-05-29 DIAGNOSIS — M545 Low back pain, unspecified: Secondary | ICD-10-CM | POA: Diagnosis not present

## 2020-05-31 ENCOUNTER — Telehealth (INDEPENDENT_AMBULATORY_CARE_PROVIDER_SITE_OTHER): Payer: BC Managed Care – PPO | Admitting: Family Medicine

## 2020-05-31 ENCOUNTER — Encounter: Payer: Self-pay | Admitting: Family Medicine

## 2020-05-31 DIAGNOSIS — M47816 Spondylosis without myelopathy or radiculopathy, lumbar region: Secondary | ICD-10-CM

## 2020-05-31 NOTE — Assessment & Plan Note (Signed)
MRI was showing facet changes.  The questionable cyst is thought to be more hypertrophy of the facet joint itself when discussed with radiologist. -Counseled on home exercise therapy and supportive care. -Pursue facet injections.

## 2020-05-31 NOTE — Progress Notes (Signed)
Virtual Visit via Video Note  I connected with Anthony Russell on 05/31/20 at 10:10 AM EDT by a video enabled telemedicine application and verified that I am speaking with the correct person using two identifiers.  Location: Patient: work Provider: office   I discussed the limitations of evaluation and management by telemedicine and the availability of in person appointments. The patient expressed understanding and agreed to proceed.  History of Present Illness:  Anthony Russell is a 40 year old male that is following up after the MRI of his lumbar spine.  He is still having ongoing low back pain with no radicular component.  His MRI was demonstrating facet changes.   Observations/Objective:  Gen: NAD, alert, cooperative with exam, well-appearing   Assessment and Plan:  Facet arthropathy lumbar spine: MRI was showing facet changes.  The questionable cyst is thought to be more hypertrophy of the facet joint itself when discussed with radiologist. -Counseled on home exercise therapy and supportive care. -Pursue facet injections.  Follow Up Instructions:    I discussed the assessment and treatment plan with the patient. The patient was provided an opportunity to ask questions and all were answered. The patient agreed with the plan and demonstrated an understanding of the instructions.   The patient was advised to call back or seek an in-person evaluation if the symptoms worsen or if the condition fails to improve as anticipated.   Anthony Gandy, MD

## 2020-06-03 DIAGNOSIS — F4323 Adjustment disorder with mixed anxiety and depressed mood: Secondary | ICD-10-CM | POA: Diagnosis not present

## 2020-06-10 DIAGNOSIS — F4323 Adjustment disorder with mixed anxiety and depressed mood: Secondary | ICD-10-CM | POA: Diagnosis not present

## 2020-06-14 ENCOUNTER — Other Ambulatory Visit: Payer: Self-pay | Admitting: Family Medicine

## 2020-06-14 ENCOUNTER — Ambulatory Visit
Admission: RE | Admit: 2020-06-14 | Discharge: 2020-06-14 | Disposition: A | Discharge status: Home / Self Care | Payer: BC Managed Care – PPO | Source: Ambulatory Visit | Attending: Family Medicine | Admitting: Family Medicine

## 2020-06-14 DIAGNOSIS — M47816 Spondylosis without myelopathy or radiculopathy, lumbar region: Secondary | ICD-10-CM

## 2020-06-14 DIAGNOSIS — M545 Low back pain, unspecified: Secondary | ICD-10-CM | POA: Diagnosis not present

## 2020-06-14 MED ORDER — METHYLPREDNISOLONE ACETATE 40 MG/ML INJ SUSP (RADIOLOG
120.0000 mg | Freq: Once | INTRAMUSCULAR | Status: AC
  Administered 2020-06-14: 120 mg via INTRA_ARTICULAR

## 2020-06-14 MED ORDER — IOPAMIDOL (ISOVUE-M 200) INJECTION 41%
1.0000 mL | Freq: Once | INTRAMUSCULAR | Status: AC
  Administered 2020-06-14: 1 mL via EPIDURAL

## 2020-06-14 NOTE — Discharge Instructions (Signed)

## 2020-06-17 DIAGNOSIS — F4323 Adjustment disorder with mixed anxiety and depressed mood: Secondary | ICD-10-CM | POA: Diagnosis not present

## 2020-06-24 DIAGNOSIS — F4323 Adjustment disorder with mixed anxiety and depressed mood: Secondary | ICD-10-CM | POA: Diagnosis not present

## 2020-07-22 DIAGNOSIS — F4323 Adjustment disorder with mixed anxiety and depressed mood: Secondary | ICD-10-CM | POA: Diagnosis not present

## 2020-07-27 DIAGNOSIS — F4323 Adjustment disorder with mixed anxiety and depressed mood: Secondary | ICD-10-CM | POA: Diagnosis not present

## 2020-08-04 DIAGNOSIS — M545 Low back pain, unspecified: Secondary | ICD-10-CM | POA: Diagnosis not present

## 2020-08-10 ENCOUNTER — Telehealth: Payer: BC Managed Care – PPO | Admitting: Family

## 2020-08-10 DIAGNOSIS — J4520 Mild intermittent asthma, uncomplicated: Secondary | ICD-10-CM

## 2020-08-11 MED ORDER — ALBUTEROL SULFATE HFA 108 (90 BASE) MCG/ACT IN AERS: 2.0000 | INHALATION_SPRAY | Freq: Four times a day (QID) | RESPIRATORY_TRACT | 0 refills | Status: DC | PRN

## 2020-08-11 MED ORDER — PREDNISONE 20 MG PO TABS: 40.0000 mg | ORAL_TABLET | Freq: Every day | ORAL | 0 refills | Status: AC

## 2020-08-11 NOTE — Progress Notes (Signed)
Visit for Asthma  Based on what you have shared with me, it looks like you may have a flare up of your asthma.  Asthma is a chronic (ongoing) lung disease which results in airway obstruction, inflammation and hyper-responsiveness.   Asthma symptoms vary from person to person, with common symptoms including nighttime awakening and decreased ability to participate in normal activities as a result of shortness of breath. It is often triggered by changes in weather, changes in the season, changes in air temperature, or inside (home, school, daycare or work) allergens such as animal dander, mold, mildew, woodstoves or cockroaches.   It can also be triggered by hormonal changes, extreme emotion, physical exertion or an upper respiratory tract illness.     It is important to identify the trigger, and then eliminate or avoid the trigger if possible.   If you have been prescribed medications to be taken on a regular basis, it is important to follow the asthma action plan and to follow guidelines to adjust medication in response to increasing symptoms of decreased peak expiratory flow rate  Treatment: I have prescribed: Albuterol (Proventil HFA; Ventolin HFA) 108 (90 Base) MCG/ACT Inhaler 2 puffs into the lungs every six hours as needed for wheezing or shortness of breath and Prednisone 40mg by mouth per day for 5  days  HOME CARE Only take medications as instructed by your medical team. Consider wearing a mask or scarf to improve breathing air temperature have been shown to decrease irritation and decrease exacerbations Get rest. Taking a steamy shower or using a humidifier may help nasal congestion sand ease sore throat pain. You can place a towel over your head and breathe in the steam from hot water coming from a faucet. Using a saline nasal spray works much the same way.  Cough drops, hare  candies and sore throat lozenges may ease your cough.  Avoid close contacts especially the very you and the elderly Cover your mouth if you cough or sneeze Always remember to wash your hands.    GET HELP RIGHT AWAY IF: You develop worsening symptoms; breathlessness at rest, drowsy, confused or agitated, unable to speak in full sentences You have coughing fits You develop a severe headache or visual changes You develop shortness of breath, difficulty breathing or start having chest pain Your symptoms persist after you have completed your treatment plan If your symptoms do not improve within 10 days  MAKE SURE YOU Understand these instructions. Will watch your condition. Will get help right away if you are not doing well or get worse.   Your e-visit answers were reviewed by a board certified advanced clinical practitioner to complete your personal care plan, Depending upon the condition, your plan could have included both over the counter or prescription medications.   Please review your pharmacy choice. Your safety is important to us. If you have drug allergies check your prescription carefully.  You can use MyChart to ask questions about today's visit, request a non-urgent  call back, or ask for a work or school excuse for 24 hours related to this e-Visit. If it has been greater than 24 hours you will need to follow up with your provider, or enter a new e-Visit to address those concerns.   You will get an e-mail in the next two days asking about your experience. I hope that your e-visit has been valuable and will speed your recovery. Thank you for using e-visits.   Approximately 5 minutes was spent documenting and reviewing   patient's chart.   

## 2020-08-16 ENCOUNTER — Encounter: Payer: Self-pay | Admitting: Family Medicine

## 2020-08-16 ENCOUNTER — Telehealth (INDEPENDENT_AMBULATORY_CARE_PROVIDER_SITE_OTHER): Payer: BC Managed Care – PPO | Admitting: Family Medicine

## 2020-08-16 ENCOUNTER — Other Ambulatory Visit: Payer: Self-pay

## 2020-08-16 DIAGNOSIS — J208 Acute bronchitis due to other specified organisms: Secondary | ICD-10-CM | POA: Diagnosis not present

## 2020-08-16 DIAGNOSIS — B9689 Other specified bacterial agents as the cause of diseases classified elsewhere: Secondary | ICD-10-CM

## 2020-08-16 MED ORDER — DOXYCYCLINE HYCLATE 100 MG PO TABS: 100.0000 mg | ORAL_TABLET | Freq: Two times a day (BID) | ORAL | 0 refills | Status: AC

## 2020-08-16 NOTE — Progress Notes (Signed)
Chief Complaint  Patient presents with   Cough   Shortness of Breath   Headache    Congestion     Anthony Russell here for URI complaints. Due to COVID-19 pandemic, we are interacting via web portal for an electronic face-to-face visit. I verified patient's ID using 2 identifiers. Patient agreed to proceed with visit via this method. Patient is at home, I am at office. Patient and I are present for visit.   Duration: 2 weeks  Associated symptoms: sinus headache, sinus congestion, sinus pain, rhinorrhea, shortness of breath, chest tightness, and coughing Denies: itchy watery eyes, ear pain, ear drainage, sore throat, wheezing, myalgia, and fevers Treatment to date: 5 d pred burst, albuterol; not helpful; Mucinex DM, Xyzal Sick contacts: No  Past Medical History:  Diagnosis Date   Allergy    Allergy, unspecified not elsewhere classified    rx w/ OTC antihistamines PRN   Anxiety    Asthma    Atypical chest pain    recent neg eval w/ normal cxr/ekg   Depression    Dyspepsia    on protonix 40mg /d   GERD (gastroesophageal reflux disease)    on protonix 40mg /d   Hyperlipidemia    on diet alone   Lumbar back pain    s/p lumbar laminectomy 2007 by DrCabbell   Overweight(278.02)    weight Jan10=246#. .he was 225# in 9/07...diet and exercise was discussed   Exam No conversational dyspnea Age appropriate judgment and insight Nml affect and mood  Acute bacterial bronchitis - Plan: doxycycline (VIBRA-TABS) 100 MG tablet  7 d course. He is getting tested for covid tomorrow and will update me if he tests +. Continue to push fluids, practice good hand hygiene, cover mouth when coughing. F/u prn. If starting to experience fevers, shaking, or shortness of breath, seek immediate care. Pt voiced understanding and agreement to the plan.  [name] [place], DO 08/16/20 1:03 PM

## 2020-08-17 DIAGNOSIS — Z20822 Contact with and (suspected) exposure to covid-19: Secondary | ICD-10-CM | POA: Diagnosis not present

## 2020-08-23 ENCOUNTER — Other Ambulatory Visit: Payer: Self-pay | Admitting: Family Medicine

## 2020-08-23 MED ORDER — FLUTICASONE PROPIONATE HFA 110 MCG/ACT IN AERO: 2.0000 | INHALATION_SPRAY | Freq: Two times a day (BID) | RESPIRATORY_TRACT | 0 refills | Status: DC

## 2020-09-01 DIAGNOSIS — M545 Low back pain, unspecified: Secondary | ICD-10-CM | POA: Diagnosis not present

## 2020-09-06 ENCOUNTER — Encounter: Payer: Self-pay | Admitting: Medical

## 2020-09-08 ENCOUNTER — Other Ambulatory Visit: Payer: Self-pay

## 2020-09-08 ENCOUNTER — Emergency Department (HOSPITAL_BASED_OUTPATIENT_CLINIC_OR_DEPARTMENT_OTHER)
Admission: EM | Admit: 2020-09-08 | Discharge: 2020-09-08 | Disposition: A | Discharge status: Home / Self Care | Payer: BC Managed Care – PPO | Attending: Emergency Medicine | Admitting: Emergency Medicine

## 2020-09-08 ENCOUNTER — Emergency Department (HOSPITAL_COMMUNITY): Payer: BC Managed Care – PPO

## 2020-09-08 ENCOUNTER — Ambulatory Visit: Payer: BC Managed Care – PPO | Admitting: Medical

## 2020-09-08 ENCOUNTER — Encounter (HOSPITAL_BASED_OUTPATIENT_CLINIC_OR_DEPARTMENT_OTHER): Payer: Self-pay | Admitting: Emergency Medicine

## 2020-09-08 ENCOUNTER — Emergency Department (HOSPITAL_BASED_OUTPATIENT_CLINIC_OR_DEPARTMENT_OTHER): Payer: BC Managed Care – PPO

## 2020-09-08 DIAGNOSIS — Z7952 Long term (current) use of systemic steroids: Secondary | ICD-10-CM | POA: Diagnosis not present

## 2020-09-08 DIAGNOSIS — J45909 Unspecified asthma, uncomplicated: Secondary | ICD-10-CM | POA: Insufficient documentation

## 2020-09-08 DIAGNOSIS — R519 Headache, unspecified: Secondary | ICD-10-CM

## 2020-09-08 DIAGNOSIS — Z87891 Personal history of nicotine dependence: Secondary | ICD-10-CM | POA: Diagnosis not present

## 2020-09-08 DIAGNOSIS — G4482 Headache associated with sexual activity: Secondary | ICD-10-CM

## 2020-09-08 DIAGNOSIS — Z20822 Contact with and (suspected) exposure to covid-19: Secondary | ICD-10-CM | POA: Diagnosis not present

## 2020-09-08 DIAGNOSIS — G4452 New daily persistent headache (NDPH): Secondary | ICD-10-CM | POA: Diagnosis not present

## 2020-09-08 DIAGNOSIS — Z7984 Long term (current) use of oral hypoglycemic drugs: Secondary | ICD-10-CM | POA: Diagnosis not present

## 2020-09-08 DIAGNOSIS — Z0389 Encounter for observation for other suspected diseases and conditions ruled out: Secondary | ICD-10-CM | POA: Diagnosis not present

## 2020-09-08 LAB — COMPREHENSIVE METABOLIC PANEL
ALT: 28 U/L (ref 0–44)
AST: 20 U/L (ref 15–41)
Albumin: 4 g/dL (ref 3.5–5.0)
Alkaline Phosphatase: 50 U/L (ref 38–126)
Anion gap: 9 (ref 5–15)
BUN: 19 mg/dL (ref 6–20)
CO2: 28 mmol/L (ref 22–32)
Calcium: 9.2 mg/dL (ref 8.9–10.3)
Chloride: 105 mmol/L (ref 98–111)
Creatinine, Ser: 1.07 mg/dL (ref 0.61–1.24)
GFR, Estimated: >60 mL/min (ref >60)
Glucose, Bld: 152 mg/dL — ABNORMAL HIGH (ref 70–99)
Potassium: 3.7 mmol/L (ref 3.5–5.1)
Sodium: 142 mmol/L (ref 135–145)
Total Bilirubin: 0.3 mg/dL (ref 0.3–1.2)
Total Protein: 6.8 g/dL (ref 6.5–8.1)

## 2020-09-08 LAB — RESP PANEL BY RT-PCR (FLU A&B, COVID) ARPGX2
Influenza A by PCR: NEGATIVE
Influenza B by PCR: NEGATIVE
SARS Coronavirus 2 by RT PCR: NEGATIVE

## 2020-09-08 LAB — CBC WITH DIFFERENTIAL/PLATELET
Abs Immature Granulocytes: 0.02 10*3/uL (ref 0.00–0.07)
Basophils Absolute: 0 10*3/uL (ref 0.0–0.1)
Basophils Relative: 0 %
Eosinophils Absolute: 0.1 10*3/uL (ref 0.0–0.5)
Eosinophils Relative: 2 %
HCT: 43.3 % (ref 39.0–52.0)
Hemoglobin: 14.7 g/dL (ref 13.0–17.0)
Immature Granulocytes: 0 %
Lymphocytes Relative: 35 %
Lymphs Abs: 2.3 10*3/uL (ref 0.7–4.0)
MCH: 30.3 pg (ref 26.0–34.0)
MCHC: 33.9 g/dL (ref 30.0–36.0)
MCV: 89.3 fL (ref 80.0–100.0)
Monocytes Absolute: 0.6 10*3/uL (ref 0.1–1.0)
Monocytes Relative: 8 %
Neutro Abs: 3.7 10*3/uL (ref 1.7–7.7)
Neutrophils Relative %: 55 %
Platelets: 149 10*3/uL — ABNORMAL LOW (ref 150–400)
RBC: 4.85 MIL/uL (ref 4.22–5.81)
RDW: 13.2 % (ref 11.5–15.5)
WBC: 6.7 10*3/uL (ref 4.0–10.5)
nRBC: 0 % (ref 0.0–0.2)

## 2020-09-08 MED ORDER — IOHEXOL 350 MG/ML SOLN
75.0000 mL | Freq: Once | INTRAVENOUS | Status: AC | PRN
  Administered 2020-09-08: 75 mL via INTRAVENOUS

## 2020-09-08 MED ORDER — LIDOCAINE HCL (PF) 1 % IJ SOLN
5.0000 mL | Freq: Once | INTRAMUSCULAR | Status: AC
  Administered 2020-09-08: 5 mL via INTRADERMAL

## 2020-09-08 MED ORDER — DIPHENHYDRAMINE HCL 50 MG/ML IJ SOLN
25.0000 mg | Freq: Once | INTRAMUSCULAR | Status: AC
  Administered 2020-09-08: 25 mg via INTRAVENOUS
  Filled 2020-09-08: qty 1

## 2020-09-08 MED ORDER — LIDOCAINE HCL (PF) 1 % IJ SOLN
INTRAMUSCULAR | Status: AC
  Administered 2020-09-08: 5 mL
  Filled 2020-09-08: qty 5

## 2020-09-08 MED ORDER — LIDOCAINE HCL (PF) 1 % IJ SOLN
5.0000 mL | Freq: Once | INTRAMUSCULAR | Status: AC
  Administered 2020-09-08: 5 mL
  Filled 2020-09-08: qty 5

## 2020-09-08 MED ORDER — PROCHLORPERAZINE EDISYLATE 10 MG/2ML IJ SOLN
10.0000 mg | Freq: Once | INTRAMUSCULAR | Status: AC
  Administered 2020-09-08: 10 mg via INTRAVENOUS
  Filled 2020-09-08: qty 2

## 2020-09-08 MED ORDER — LIDOCAINE HCL (PF) 1 % IJ SOLN: 5.0000 mL | Freq: Once | INTRAMUSCULAR | Status: DC

## 2020-09-08 NOTE — ED Provider Notes (Signed)
[facility] EMERGENCY DEPARTMENT Provider Note   CSN: [id number] Arrival date & time: 09/08/20  0253     History Chief Complaint  Patient presents with   Headache    Anthony Russell is a 40 y.o. male.  HPI      Saturday afternoon having intercourse and headache began like a stabbing, like something was going to explode Has been constant since then, a constant pulsing pain, waxing and waning but always there Hx of headache with intercourse before but has never continued afterward 6/10 now, was 10/10 when it started Saturday, sudden worse headache No nausea or vomiting No numbness, weakness, change in vision, facial droop, difficulty walking or talking Loud sounds make it worse  a little bit No neck pain beyond usual Mom and dad have migraines, no family hx of aneurysm  Has had prior headaches but nothing like this before  Took advil, tylenol, benadryl all at once, went to bed and woke up with it pulsing and came in for evaluation  Past Medical History:  Diagnosis Date   Allergy    Allergy, unspecified not elsewhere classified    rx w/ OTC antihistamines PRN   Anxiety    Asthma    Atypical chest pain    recent neg eval w/ normal cxr/ekg   Depression    Dyspepsia    on protonix 40mg /d   GERD (gastroesophageal reflux disease)    on protonix 40mg /d   Hyperlipidemia    on diet alone   Lumbar back pain    s/p lumbar laminectomy 2007 by DrCabbell   Overweight(278.02)    weight Jan10=246#. .he was 225# in 9/07...diet and exercise was discussed    Patient Active Problem List   Diagnosis Date Noted   Subacromial bursitis of right shoulder joint 05/17/2020   Hypersomnia 11/03/2015   Obesity 11/03/2015   Acute bacterial bronchitis 01/18/2015   Allergic rhinitis 04/15/2014   Asthma in adult 04/15/2014   GERD (gastroesophageal reflux disease) 04/15/2014   IBS (irritable bowel syndrome) 04/01/2013   Nausea alone 01/15/2013   Diarrhea 01/15/2013   Abdominal  cramping 01/15/2013   Depression 10/15/2012   Lesion of eyebrow 02/09/2012   Reactive airways dysfunction syndrome (HCC) 07/05/2011   Gastroparesis 07/05/2011   Anxiety 07/05/2010   TESTOSTERONE DEFICIENCY 05/26/2008   FATIGUE 05/25/2008   ABDOMINAL BLOATING 04/03/2008   HYPERLIPIDEMIA 02/06/2008   OVERWEIGHT 02/06/2008   Facet arthropathy, lumbar 02/06/2008   CHEST PAIN, ATYPICAL 02/06/2008   GERD 11/19/2007   ALLERGY 11/18/2007    Past Surgical History:  Procedure Laterality Date   LUMBAR LAMINECTOMY  2007   DrCabbell   TEAR DUCT PROBING         Family History  Problem Relation Age of Onset   Allergies Mother    Irritable bowel syndrome Mother    Allergies Father    Allergies Brother    Irritable bowel syndrome Brother    Colon cancer Neg Hx     Social History   Tobacco Use   Smoking status: Former    Packs/day: 0.10    Years: 8.00    Pack years: 0.80    Types: Cigarettes    Quit date: 03/03/2011    Years since quitting: 9.5   Smokeless tobacco: Never  Vaping Use   Vaping Use: Never used  Substance Use Topics   Alcohol use: Yes    Comment: rare   Drug use: No    Home Medications Prior to Admission medications   Medication Sig  Start Date End Date Taking? Authorizing Provider  albuterol (VENTOLIN HFA) 108 (90 Base) MCG/ACT inhaler Inhale 2 puffs into the lungs every 6 (six) hours as needed for wheezing or shortness of breath. 08/11/20   Junie Spencer, FNP  atorvastatin (LIPITOR) 10 MG tablet Take 1 tablet (10 mg total) by mouth daily. 08/16/19   Saguier, Ramon Dredge, PA-C  azelastine (ASTELIN) 0.1 % nasal spray Place 2 sprays into both nostrils 2 (two) times daily. Use in each nostril as directed 04/19/18   Saguier, Ramon Dredge, PA-C  cyclobenzaprine (FLEXERIL) 10 MG tablet Take 1 tablet (10 mg total) by mouth at bedtime. 04/29/20   Saguier, Ramon Dredge, PA-C  famciclovir (FAMVIR) 500 MG tablet Take 1 tablet (500 mg total) by mouth 3 (three) times daily. 07/22/19   Saguier,  Ramon Dredge, PA-C  fenofibrate (TRICOR) 48 MG tablet TAKE 1 TABLET(48 MG) BY MOUTH DAILY 03/22/19   Saguier, Ramon Dredge, PA-C  fluticasone [facility]) 50 MCG/ACT nasal spray Place 2 sprays into both nostrils daily. 03/16/18   Guse, Janna Arch, FNP  fluticasone (FLONASE) 50 MCG/ACT nasal spray Place 2 sprays into both nostrils daily. 11/11/18   Saguier, Ramon Dredge, PA-C  fluticasone (FLONASE) 50 MCG/ACT nasal spray Place 2 sprays into both nostrils daily. 04/05/20   Saguier, Ramon Dredge, PA-C  fluticasone (FLOVENT HFA) 110 MCG/ACT inhaler Inhale 2 puffs into the lungs 2 (two) times daily. 08/23/20   Sharlene Dory, DO  HYDROcodone-homatropine (HYCODAN) 5-1.5 MG/5ML syrup Take 5 mLs by mouth every 6 (six) hours as needed. 11/11/18   Saguier, Ramon Dredge, PA-C  levocetirizine (XYZAL) 5 MG tablet Take 1 tablet (5 mg total) by mouth every evening. 11/28/18   Saguier, Ramon Dredge, PA-C  levocetirizine (XYZAL) 5 MG tablet Take 1 tablet (5 mg total) by mouth every evening. 04/05/20   Saguier, Ramon Dredge, PA-C  metFORMIN (GLUCOPHAGE) 1000 MG tablet TAKE 1 TABLET(1000 MG) BY MOUTH TWICE DAILY WITH A MEAL 08/16/19   Saguier, Ramon Dredge, PA-C  montelukast (SINGULAIR) 10 MG tablet Take 1 tablet (10 mg total) by mouth at bedtime. 04/19/18   Saguier, Ramon Dredge, PA-C  OZEMPIC, 0.25 OR 0.5 MG/DOSE, 2 MG/1.5ML SOPN INJECT 0.25 MG UNDER THE SKIN ONCE A WEEK 06/13/18   Saguier, Ramon Dredge, PA-C  pantoprazole (PROTONIX) 40 MG tablet Take 1 tablet (40 mg total) by mouth daily. 04/05/20   Saguier, Ramon Dredge, PA-C  promethazine-dextromethorphan (PROMETHAZINE-DM) 6.25-15 MG/5ML syrup Take 5 mLs by mouth 4 (four) times daily as needed for cough. 01/17/19   Muthersbaugh, Dahlia Client, PA-C    Allergies    Patient has no known allergies.  Review of Systems   Review of Systems  Constitutional:  Negative for fever.  HENT:  Negative for sore throat.   Eyes:  Negative for visual disturbance.  Respiratory:  Negative for shortness of breath.   Cardiovascular:  Negative for chest  pain.  Gastrointestinal:  Negative for abdominal pain, nausea and vomiting.  Genitourinary:  Negative for difficulty urinating.  Musculoskeletal:  Negative for back pain and neck stiffness.  Skin:  Negative for rash.  Neurological:  Positive for headaches. Negative for dizziness, syncope, facial asymmetry, speech difficulty, weakness and numbness.   Physical Exam Updated Vital Signs BP 126/71   Pulse 95   Temp 98.3 F (36.8 C) (Oral)   Resp 20   Ht 5\' 11"  (1.803 m)   Wt 102.1 kg   SpO2 97%   BMI 31.38 kg/m   Physical Exam Vitals and nursing note reviewed.  Constitutional:      General: He is not in  acute distress.    Appearance: He is well-developed. He is not diaphoretic.  HENT:     Head: Normocephalic and atraumatic.  Eyes:     Conjunctiva/sclera: Conjunctivae normal.  Cardiovascular:     Rate and Rhythm: Normal rate and regular rhythm.     Heart sounds: Normal heart sounds. No murmur heard.   No friction rub. No gallop.  Pulmonary:     Effort: Pulmonary effort is normal. No respiratory distress.     Breath sounds: Normal breath sounds. No wheezing or rales.  Abdominal:     General: There is no distension.     Palpations: Abdomen is soft.     Tenderness: There is no abdominal tenderness. There is no guarding.  Musculoskeletal:     Cervical back: Normal range of motion.  Skin:    General: Skin is warm and dry.  Neurological:     Mental Status: He is alert and oriented to person, place, and time.    ED Results / Procedures / Treatments   Labs (all labs ordered are listed, but only abnormal results are displayed) Labs Reviewed  CBC WITH DIFFERENTIAL/PLATELET - Abnormal; Notable for the following components:      Result Value   Platelets 149 (*)    All other components within normal limits  COMPREHENSIVE METABOLIC PANEL - Abnormal; Notable for the following components:   Glucose, Bld 152 (*)    All other components within normal limits  RESP PANEL BY RT-PCR  (FLU A&B, COVID) [id number]    EKG None  Radiology CT Angio Head W or Wo Contrast  Result Date: 09/08/2020 CLINICAL DATA:  40 year old male with persistent headache. Sudden onset severe headache during intercourse 3 days ago which is unresolved. Diabetic. EXAM: CT ANGIOGRAPHY HEAD AND NECK TECHNIQUE: Multidetector CT imaging of the head and neck was performed using the standard protocol during bolus administration of intravenous contrast. Multiplanar CT image reconstructions and MIPs were obtained to evaluate the vascular anatomy. Carotid stenosis measurements (when applicable) are obtained utilizing NASCET criteria, using the distal internal carotid diameter as the denominator. CONTRAST:  75mL OMNIPAQUE IOHEXOL 350 MG/ML SOLN COMPARISON:  None. FINDINGS: CT HEAD Brain: Normal cerebral volume. No midline shift, ventriculomegaly, mass effect, evidence of mass lesion, intracranial hemorrhage or evidence of cortically based acute infarction. Gray-white matter differentiation is within normal limits throughout the brain. Calvarium and skull base: No acute osseous abnormality identified. Hyperostosis of the calvarium, frequently a normal variant. Paranasal sinuses: Visualized paranasal sinuses and mastoids are clear. Orbits: Visualized orbits and scalp soft tissues are within normal limits. CTA NECK Skeleton: Absent dentition with dental implants. Ossification of the posterior longitudinal ligament in the cervical spine most pronounced at C2-C3 and C3-C4 (series 13, image 105). Associated mild spinal stenosis. No acute osseous abnormality identified. Upper chest: Negative. Other neck: Slight asymmetry of the palatine tonsils greater on the left (series 10, image 142). But no masslike enhancement. Remaining pharyngeal contours, parapharyngeal and retropharyngeal spaces appear negative. No cervical lymphadenopathy. Aortic arch: 3 vessel arch configuration with no arch atherosclerosis. Right carotid system: Negative.  Left carotid system: Negative. Vertebral arteries: Minimal calcified plaque in the proximal right subclavian artery. Normal right vertebral artery origin. Right vertebral artery appears patent and normal to the skull base. Normal proximal left subclavian artery and left vertebral artery origin. Left vertebral artery appears mildly dominant, patent, and normal to the skull base. CTA HEAD Suboptimal but adequate intracranial arterial contrast. Intracranial venous contrast is dominant. Posterior circulation: Dominant left vertebral  V4 segment. Patent distal vertebral arteries and PICA origins with no plaque or stenosis. Patent vertebrobasilar junction and basilar artery without stenosis. SCA and PCA origins are within normal limits. Tortuous left P1 segment. Posterior communicating arteries are diminutive or absent. Bilateral PCA branches are within normal limits. Anterior circulation: Both ICA siphons are patent with no siphon plaque or stenosis identified. Patent carotid termini. Patent MCA and ACA origins. Anterior communicating artery and bilateral ACA branches are within normal limits. Left MCA M1 segment and visible left MCA branches are within normal limits. Right MCA M1 segment and visible branches are within normal limits. Venous sinuses: Patent, with venous dominant contrast timing inside the skull. Anatomic variants: Dominant left vertebral artery. Review of the MIP images confirms the above findings IMPRESSION: 1. Suboptimal intracranial arterial contrast bolus but no intracranial aneurysm identified, and normal arterial findings on CTA head and neck. 2. Normal CT appearance of the brain. 3. Asymmetry of the palatine tonsils, larger on the left. No mass-like enhancement or cervical lymphadenopathy. Still, follow-up with ENT for direct inspection of the tonsil would be ideal. 4. Ossification of the posterior longitudinal ligament (OPLL) in the cervical spine with associated mild spinal stenosis.  Electronically Signed   By: Odessa Fleming M.D.   On: 09/08/2020 04:44   CT Angio Neck W and/or Wo Contrast  Result Date: 09/08/2020 CLINICAL DATA:  40 year old male with persistent headache. Sudden onset severe headache during intercourse 3 days ago which is unresolved. Diabetic. EXAM: CT ANGIOGRAPHY HEAD AND NECK TECHNIQUE: Multidetector CT imaging of the head and neck was performed using the standard protocol during bolus administration of intravenous contrast. Multiplanar CT image reconstructions and MIPs were obtained to evaluate the vascular anatomy. Carotid stenosis measurements (when applicable) are obtained utilizing NASCET criteria, using the distal internal carotid diameter as the denominator. CONTRAST:  75mL OMNIPAQUE IOHEXOL 350 MG/ML SOLN COMPARISON:  None. FINDINGS: CT HEAD Brain: Normal cerebral volume. No midline shift, ventriculomegaly, mass effect, evidence of mass lesion, intracranial hemorrhage or evidence of cortically based acute infarction. Gray-white matter differentiation is within normal limits throughout the brain. Calvarium and skull base: No acute osseous abnormality identified. Hyperostosis of the calvarium, frequently a normal variant. Paranasal sinuses: Visualized paranasal sinuses and mastoids are clear. Orbits: Visualized orbits and scalp soft tissues are within normal limits. CTA NECK Skeleton: Absent dentition with dental implants. Ossification of the posterior longitudinal ligament in the cervical spine most pronounced at C2-C3 and C3-C4 (series 13, image 105). Associated mild spinal stenosis. No acute osseous abnormality identified. Upper chest: Negative. Other neck: Slight asymmetry of the palatine tonsils greater on the left (series 10, image 142). But no masslike enhancement. Remaining pharyngeal contours, parapharyngeal and retropharyngeal spaces appear negative. No cervical lymphadenopathy. Aortic arch: 3 vessel arch configuration with no arch atherosclerosis. Right carotid  system: Negative. Left carotid system: Negative. Vertebral arteries: Minimal calcified plaque in the proximal right subclavian artery. Normal right vertebral artery origin. Right vertebral artery appears patent and normal to the skull base. Normal proximal left subclavian artery and left vertebral artery origin. Left vertebral artery appears mildly dominant, patent, and normal to the skull base. CTA HEAD Suboptimal but adequate intracranial arterial contrast. Intracranial venous contrast is dominant. Posterior circulation: Dominant left vertebral V4 segment. Patent distal vertebral arteries and PICA origins with no plaque or stenosis. Patent vertebrobasilar junction and basilar artery without stenosis. SCA and PCA origins are within normal limits. Tortuous left P1 segment. Posterior communicating arteries are diminutive or absent. Bilateral PCA branches  are within normal limits. Anterior circulation: Both ICA siphons are patent with no siphon plaque or stenosis identified. Patent carotid termini. Patent MCA and ACA origins. Anterior communicating artery and bilateral ACA branches are within normal limits. Left MCA M1 segment and visible left MCA branches are within normal limits. Right MCA M1 segment and visible branches are within normal limits. Venous sinuses: Patent, with venous dominant contrast timing inside the skull. Anatomic variants: Dominant left vertebral artery. Review of the MIP images confirms the above findings IMPRESSION: 1. Suboptimal intracranial arterial contrast bolus but no intracranial aneurysm identified, and normal arterial findings on CTA head and neck. 2. Normal CT appearance of the brain. 3. Asymmetry of the palatine tonsils, larger on the left. No mass-like enhancement or cervical lymphadenopathy. Still, follow-up with ENT for direct inspection of the tonsil would be ideal. 4. Ossification of the posterior longitudinal ligament (OPLL) in the cervical spine with associated mild spinal  stenosis. Electronically Signed   By: Odessa Fleming M.D.   On: 09/08/2020 04:44    Procedures .Lumbar Puncture  Date/Time: 09/08/2020 7:30 AM Performed by: Alvira Monday, MD Authorized by: Alvira Monday, MD   Consent:    Consent obtained:  Verbal   Consent given by:  Patient   Risks, benefits, and alternatives were discussed: yes     Risks discussed:  Bleeding, infection, nerve damage, repeat procedure, headache and pain   Alternatives discussed:  No treatment Universal protocol:    Patient identity confirmed:  Verbally with patient Pre-procedure details:    Procedure purpose:  Diagnostic   Preparation: Patient was prepped and draped in usual sterile fashion   Anesthesia:    Anesthesia method:  Local infiltration   Local anesthetic:  Lidocaine 1% w/o epi Procedure details:    Lumbar space:  L4-L5 interspace   Patient position:  Sitting   Number of attempts:  2 Post-procedure details:    Procedure completion:  Tolerated well, no immediate complications   Medications Ordered in ED Medications  prochlorperazine (COMPAZINE) injection 10 mg (10 mg Intravenous Given 09/08/20 0413)  diphenhydrAMINE (BENADRYL) injection 25 mg (25 mg Intravenous Given 09/08/20 0413)  iohexol (OMNIPAQUE) 350 MG/ML injection 75 mL (75 mLs Intravenous Contrast Given 09/08/20 0401)  lidocaine (PF) (XYLOCAINE) 1 % injection 5 mL (5 mLs Other Given by Other 09/08/20 0618)  lidocaine (PF) (XYLOCAINE) 1 % injection 5 mL (5 mLs Intradermal Given by Other 09/08/20 2683)    ED Course  I have reviewed the triage vital signs and the nursing notes.  Pertinent labs & imaging results that were available during my care of the patient were reviewed by me and considered in my medical decision making (see chart for details).    MDM Rules/Calculators/A&P                           40yo male with history of DM, asthma, hyperlipidemia, back pain, history of lumbar microdiscectomy per pt who presents with sudden onset  coital headache.  DDx includes SAH, ICH, dissection, RCVS, CVT or primary headache associated with sexual activity.  Does not have historical findings to suggest meningitis, cardiac ischemia, pheochromocytoma.    CTA completed with suboptimal bolus timing however does not show signs of SAH or aneurysm. Discussed my history and exam with Dr. Iver Nestle and if his LP is without xanthochromia based on my history/exam feels he can pursue outpatient follow up.  Attempted LP however unable to obtain CSF unfortunately. Placed order for  placement by fluoroscopy with radiology and discussed transfer with Dr. Blinda LeatherwoodPollina.  Transferred for LP under fluorscopy to evaluate for occult SAH in setting of sudden onset severe persistent post coital headache. If no signs of SAH on LP, may consider DC with outpatient Neuro follow up.    Final Clinical Impression(s) / ED Diagnoses Final diagnoses:  Coital headache  Severe headache    Rx / DC Orders ED Discharge Orders     None        Alvira MondaySchlossman, Lilliane Sposito, MD 09/08/20 [phone]

## 2020-09-08 NOTE — ED Provider Notes (Signed)
Patient sent over to the emergency department to get LP to rule out subarachnoid hemorrhage.  LP was attempted by the radiologist and was unsuccessful.  The patient has had surgery to his back before.  Radiologist suggested an MRI of the brain without.  This study was negative and the patient was feeling better so was discharged home   Bethann Berkshire, MD 09/08/20 1320

## 2020-09-08 NOTE — ED Triage Notes (Signed)
Pt reports sudden severe HA while having intercourse 3 days ago. Pt reports HA is less severe but has been persistent since. Denies n/v.

## 2020-09-08 NOTE — Procedures (Signed)
Unsuccessful fluoroscopic guided lumbar puncture due to the presence of bony ankylosis in the posterior spinal column. Attempts were made at the L4-5 and L2-3 levels.

## 2020-09-08 NOTE — ED Notes (Signed)
Transported to MRI

## 2020-09-08 NOTE — ED Notes (Signed)
Pt d/c home per MD order. Discharge summary reviewed with pt, pt verbalizes understanding. Reports discharge ride home. No s/s of acute distress noted.

## 2020-09-08 NOTE — ED Notes (Signed)
Pt arrives via [facility] from [facility] with HA and unsuccessful LP

## 2020-09-08 NOTE — ED Notes (Signed)
Patient transported to X-ray 

## 2020-09-08 NOTE — Discharge Instructions (Addendum)
Tylenol or Motrin for pain.  Follow-up with your family doctor if any problems.

## 2020-09-08 NOTE — ED Notes (Signed)
Pt transported to MRI 

## 2020-09-08 NOTE — ED Notes (Signed)
2 doses of 59mL Lidocaine were given by EDP during procedure

## 2020-09-08 NOTE — ED Notes (Signed)
Pt to CT

## 2020-09-10 ENCOUNTER — Telehealth: Payer: Self-pay | Admitting: Medical

## 2020-09-10 ENCOUNTER — Ambulatory Visit (INDEPENDENT_AMBULATORY_CARE_PROVIDER_SITE_OTHER): Payer: BC Managed Care – PPO | Admitting: Medical

## 2020-09-10 ENCOUNTER — Other Ambulatory Visit: Payer: Self-pay

## 2020-09-10 VITALS — BP 113/68 | HR 99 | Resp 18 | Ht 71.0 in | Wt 240.2 lb

## 2020-09-10 DIAGNOSIS — G43819 Other migraine, intractable, without status migrainosus: Secondary | ICD-10-CM

## 2020-09-10 MED ORDER — KETOROLAC TROMETHAMINE 60 MG/2ML IM SOLN
60.0000 mg | Freq: Once | INTRAMUSCULAR | Status: AC
Start: 2020-09-10 — End: 2020-09-10
  Administered 2020-09-10: 60 mg via INTRAMUSCULAR

## 2020-09-10 MED ORDER — SUMATRIPTAN SUCCINATE 50 MG PO TABS: ORAL_TABLET | ORAL | 0 refills | Status: DC

## 2020-09-10 NOTE — Telephone Encounter (Signed)
Will you call pt and see how he is. What level of headache does he have presently. Late appointment slot. So wanted to now if low level or if severe 8/10. Other day in ED was worst ha.   Want to know as options on Friday end of the day limited. Maybe he needs to be seen back in ED??  Let me know level of ha and if other signs and symptoms

## 2020-09-10 NOTE — Patient Instructions (Signed)
History of described probable migraine headaches over the years with light sensitivity.  However most recently had severe headache after sex.  Also you report remote history of additional severe headache after sex about 4 years ago.  Presently  headache level is 2/ 10 and you are describing some slight light sensitivity.  You have normal neurologic exam.  I reviewed extensive work-up done by the emergency department.  Will prescribe Toradol 60 mg IM injection today.  Hopefully your headache will subside/stop.  Then making Imitrex 50 mg tablets available to use 1 tablet at early onset of migraine-like headache.  Can repeat in 2 hours if needed.  If you have recurrent headache severe or any gross motor/sensory deficits then recommend emergency department evaluation again.  Also went ahead and placed referral to see headache specialist Dr. Lucia Gaskins.  Asking for that appointment to be within a week or 2.  Would recommend going ahead and following up with me in 10 to 14 days particularly if appointment to neurologist is delayed

## 2020-09-10 NOTE — Progress Notes (Signed)
Subjective:    Patient ID: Anthony Russell, male    DOB: 09-10-1980, 40 y.o.   MRN: [id number]  HPI  Pt in for follow up from hospital. HA level is 2/10 presently. Early in the day ha was 1/10. Since ED evaluation his ha did not go away. But decreased significantly.   Pt states when he was younger he did have some light sensitive ha in the past. He does not history of migraines in past. Pt states in the past would just take advil or bc powder then get in dark room.  Ha frequency every 2-3 months and about 1-2 month. Usually ha would last just 2-3 hours.   In the ED he had negative ct angio neck, ct angio head, mir and attempted lumbar puncture. Lumbar puncture was unsuccessful.   Eventually his ha decreased some and then discharged.  Pt took tylenol for ha today.  Pt bp is well controlled.   Reviewed ED note. Ha came on with sex. This happened one other time years ago.   Review of Systems  Constitutional:  Negative for chills, fatigue and fever.  Respiratory:  Negative for cough, chest tightness, shortness of breath and wheezing.   Cardiovascular:  Negative for chest pain and palpitations.  Gastrointestinal:  Negative for abdominal pain.  Genitourinary:  Negative for dysuria, flank pain and frequency.  Musculoskeletal:  Negative for back pain, neck pain and neck stiffness.  Skin:  Negative for rash.  Neurological:  Positive for headaches. Negative for dizziness, seizures, syncope and weakness.  Hematological:  Negative for adenopathy. Does not bruise/bleed easily.  Psychiatric/Behavioral:  Negative for behavioral problems, decreased concentration, dysphoric mood and sleep disturbance.    Past Medical History:  Diagnosis Date   Allergy    Allergy, unspecified not elsewhere classified    rx w/ OTC antihistamines PRN   Anxiety    Asthma    Atypical chest pain    recent neg eval w/ normal cxr/ekg   Depression    Dyspepsia    on protonix 40mg /d   GERD (gastroesophageal  reflux disease)    on protonix 40mg /d   Hyperlipidemia    on diet alone   Lumbar back pain    s/p lumbar laminectomy 2007 by DrCabbell   Overweight(278.02)    weight Jan10=246#. .he was 225# in 9/07...diet and exercise was discussed     Social History   Socioeconomic History   Marital status: Single    Spouse name: Not on file   Number of children: 0   Years of education: Not on file   Highest education level: Not on file  Occupational History   Occupation: software   Tobacco Use   Smoking status: Former    Packs/day: 0.10    Years: 8.00    Pack years: 0.80    Types: Cigarettes    Quit date: 03/03/2011    Years since quitting: 9.5   Smokeless tobacco: Never  Vaping Use   Vaping Use: Never used  Substance and Sexual Activity   Alcohol use: Yes    Comment: rare   Drug use: No   Sexual activity: Not on file  Other Topics Concern   Not on file  Social History Narrative   3 cups caffeine a day   Social Determinants of Health   Financial Resource Strain: Not on file  Food Insecurity: Not on file  Transportation Needs: Not on file  Physical Activity: Not on file  Stress: Not on file  Social Connections: Not  on file  Intimate Partner Violence: Not on file    Past Surgical History:  Procedure Laterality Date   LUMBAR LAMINECTOMY  2007   DrCabbell   TEAR DUCT PROBING      Family History  Problem Relation Age of Onset   Allergies Mother    Irritable bowel syndrome Mother    Allergies Father    Allergies Brother    Irritable bowel syndrome Brother    Colon cancer Neg Hx     No Known Allergies  Current Outpatient Medications on File Prior to Visit  Medication Sig Dispense Refill   albuterol (VENTOLIN HFA) 108 (90 Base) MCG/ACT inhaler Inhale 2 puffs into the lungs every 6 (six) hours as needed for wheezing or shortness of breath. 8 g 0   atorvastatin (LIPITOR) 10 MG tablet Take 1 tablet (10 mg total) by mouth daily. 30 tablet 11   azelastine (ASTELIN) 0.1 %  nasal spray Place 2 sprays into both nostrils 2 (two) times daily. Use in each nostril as directed (Patient not taking: No sig reported) 30 mL 12   calcium carbonate (OS-CAL) 1250 (500 Ca) MG chewable tablet Chew 2 tablets by mouth daily.     cyclobenzaprine (FLEXERIL) 10 MG tablet Take 1 tablet (10 mg total) by mouth at bedtime. (Patient not taking: No sig reported) 5 tablet 0   famciclovir (FAMVIR) 500 MG tablet Take 1 tablet (500 mg total) by mouth 3 (three) times daily. (Patient not taking: No sig reported) 21 tablet 0   fenofibrate (TRICOR) 48 MG tablet TAKE 1 TABLET(48 MG) BY MOUTH DAILY (Patient not taking: No sig reported) 30 tablet 3   fluticasone (FLONASE) 50 MCG/ACT nasal spray Place 2 sprays into both nostrils daily. (Patient not taking: No sig reported) 16 g 0   fluticasone (FLONASE) 50 MCG/ACT nasal spray Place 2 sprays into both nostrils daily. (Patient not taking: No sig reported) 16 g 1   fluticasone (FLONASE) 50 MCG/ACT nasal spray Place 2 sprays into both nostrils daily. (Patient not taking: No sig reported) 16 g 1   fluticasone (FLOVENT HFA) 110 MCG/ACT inhaler Inhale 2 puffs into the lungs 2 (two) times daily. 1 each 0   HYDROcodone-homatropine (HYCODAN) 5-1.5 MG/5ML syrup Take 5 mLs by mouth every 6 (six) hours as needed. (Patient not taking: No sig reported) 100 mL 0   L-Arginine 500 MG CAPS Take 2 capsules by mouth 2 (two) times daily.     levocetirizine (XYZAL) 5 MG tablet Take 1 tablet (5 mg total) by mouth every evening. 30 tablet 3   levocetirizine (XYZAL) 5 MG tablet Take 1 tablet (5 mg total) by mouth every evening. (Patient not taking: No sig reported) 30 tablet 3   metFORMIN (GLUCOPHAGE) 1000 MG tablet TAKE 1 TABLET(1000 MG) BY MOUTH TWICE DAILY WITH A MEAL 60 tablet 5   montelukast (SINGULAIR) 10 MG tablet Take 1 tablet (10 mg total) by mouth at bedtime. (Patient not taking: No sig reported) 30 tablet 3   Multiple Vitamin (MULTIVITAMIN ADULT PO) Take 2 tablets by mouth  daily.     Nutritional Supplements (PYCNOGENOL) 300-30 MG CAPS Take 1 capsule by mouth 3 (three) times daily.     OZEMPIC, 0.25 OR 0.5 MG/DOSE, 2 MG/1.5ML SOPN INJECT 0.25 MG UNDER THE SKIN ONCE A WEEK (Patient not taking: No sig reported) 1.5 mL 2   pantoprazole (PROTONIX) 40 MG tablet Take 1 tablet (40 mg total) by mouth daily. 90 tablet 3   promethazine-dextromethorphan (PROMETHAZINE-DM) 6.25-15 MG/5ML  syrup Take 5 mLs by mouth 4 (four) times daily as needed for cough. (Patient not taking: No sig reported) 118 mL 0   No current facility-administered medications on file prior to visit.    BP 113/68   Pulse 99   Resp 18   Ht 5\' 11"  (1.803 m)   Wt 240 lb 3.2 oz (109 kg)   SpO2 99%   BMI 33.50 kg/m       Objective:   Physical Exam   General Mental Status- Alert. General Appearance- Not in acute distress.   Skin General: Color- Normal Color. Moisture- Normal Moisture.  Neck Carotid Arteries- Normal color. Moisture- Normal Moisture. No carotid bruits. No JVD.  Chest and Lung Exam Auscultation: Breath Sounds:-Normal.  Cardiovascular Auscultation:Rythm- Regular. Murmurs & Other Heart Sounds:Auscultation of the heart reveals- No Murmurs.  Abdomen Inspection:-Inspeection Normal. Palpation/Percussion:Note:No mass. Palpation and Percussion of the abdomen reveal- Non Tender, Non Distended + BS, no rebound or guarding.   Neurologic Cranial Nerve exam:- CN III-XII intact(No nystagmus), symmetric smile. Drift Test:- No drift. Romberg Exam:- Negative.  Heal to Toe Gait exam:-Normal. Finger to Nose:- Normal/Intact Strength:- 5/5 equal and symmetric strength both upper and lower extremities.      Assessment & Plan:   History of described probable migraine headaches over the years with light sensitivity.  However most recently had severe headache after sex.  Also you report remote history of additional severe headache after sex about 4 years ago.  Presently  headache level is  2/ 10 and you are describing some slight light sensitivity.  You have normal neurologic exam.  I reviewed extensive work-up done by the emergency department.  Will prescribe Toradol 60 mg IM injection today.  Hopefully your headache will subside/stop.  Then making Imitrex 50 mg tablets available to use 1 tablet at early onset of migraine-like headache.  Can repeat in 2 hours if needed.  If you have recurrent headache severe or any gross motor/sensory deficits then recommend emergency department evaluation again.  Also went ahead and placed referral to see headache specialist Dr. .  Asking for that appointment to be within a week or 2.  Would recommend going ahead and following up with me in 10 to 14 days particularly if appointment to neurologist is delayed  Lucia Gaskins, PA-C

## 2020-09-10 NOTE — Telephone Encounter (Signed)
Patient still having constant  headaches , has photosensitivity, pain level is 2//10 .. no other sx

## 2020-09-13 ENCOUNTER — Telehealth: Payer: Self-pay

## 2020-09-13 MED ORDER — BUTALBITAL-APAP-CAFFEINE 50-325-40 MG PO TABS: 1.0000 | ORAL_TABLET | Freq: Four times a day (QID) | ORAL | 0 refills | Status: DC | PRN

## 2020-09-13 NOTE — Telephone Encounter (Signed)
Called pt back- the HA is still persistent at at 3-4/10 He got a shot of toradol on Friday He also tried imitrex but noted side effects   He did take 1g of tylenol this am around 0700; will be 6 hours by the time he can pick up rx   Will rx fioricet- advised this may cause drowsiness, no additional tylenol with this rx Please let us know if not resolving his sx

## 2020-09-13 NOTE — Addendum Note (Signed)
Addended by: Abbe Amsterdam C on: 09/13/2020 12:30 PM   Modules accepted: Orders

## 2020-09-13 NOTE — Telephone Encounter (Signed)
Pt was seen by ES on Friday 8/12 for ER follow up- HA. He was given Rx for Imitrex that made him nauseous. He says the injection helped some but has worn off. Please advise  Pharmacy: [facility] [place] Call Back: [phone]

## 2020-09-20 ENCOUNTER — Encounter: Payer: Self-pay | Admitting: Medical

## 2020-10-08 DIAGNOSIS — M545 Low back pain, unspecified: Secondary | ICD-10-CM | POA: Diagnosis not present

## 2020-10-12 ENCOUNTER — Encounter (HOSPITAL_COMMUNITY): Payer: Self-pay | Admitting: Radiology
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
## 2020-10-19 ENCOUNTER — Ambulatory Visit (INDEPENDENT_AMBULATORY_CARE_PROVIDER_SITE_OTHER): Payer: BC Managed Care – PPO | Admitting: Psychiatry

## 2020-10-19 ENCOUNTER — Other Ambulatory Visit: Payer: Self-pay

## 2020-10-19 VITALS — BP 137/88 | HR 100 | Ht 71.0 in | Wt 244.8 lb

## 2020-10-19 DIAGNOSIS — G4482 Headache associated with sexual activity: Secondary | ICD-10-CM | POA: Diagnosis not present

## 2020-10-19 DIAGNOSIS — G4453 Primary thunderclap headache: Secondary | ICD-10-CM | POA: Diagnosis not present

## 2020-10-19 MED ORDER — INDOMETHACIN 25 MG PO CAPS
25.0000 mg | ORAL_CAPSULE | Freq: Three times a day (TID) | ORAL | 2 refills | Status: DC
Start: 2020-10-19 — End: 2021-03-01

## 2020-10-19 NOTE — Patient Instructions (Addendum)
Start Indomethacin 25 mg three times a day for 5 days to help break current headache Once headache cycle breaks, you can take Indomethacin 30 minutes prior to intercourse to prevent sex headache Let me know if headache does not improve after Indomethacin course

## 2020-10-25 MED ORDER — PROPRANOLOL HCL 20 MG PO TABS: 20.0000 mg | ORAL_TABLET | Freq: Two times a day (BID) | ORAL | 3 refills | Status: DC

## 2020-11-02 ENCOUNTER — Encounter: Payer: Self-pay | Admitting: Medical

## 2020-11-03 DIAGNOSIS — E119 Type 2 diabetes mellitus without complications: Secondary | ICD-10-CM | POA: Diagnosis not present

## 2020-11-03 NOTE — Telephone Encounter (Signed)
Letter in " Letters" from 10/08/2020

## 2020-11-08 ENCOUNTER — Ambulatory Visit (INDEPENDENT_AMBULATORY_CARE_PROVIDER_SITE_OTHER): Payer: BC Managed Care – PPO | Admitting: Medical

## 2020-11-08 ENCOUNTER — Other Ambulatory Visit: Payer: Self-pay

## 2020-11-08 VITALS — BP 127/69 | HR 87 | Temp 98.2°F | Resp 18 | Ht 71.0 in | Wt 240.0 lb

## 2020-11-08 DIAGNOSIS — J351 Hypertrophy of tonsils: Secondary | ICD-10-CM

## 2020-11-08 DIAGNOSIS — Z1283 Encounter for screening for malignant neoplasm of skin: Secondary | ICD-10-CM

## 2020-11-08 DIAGNOSIS — J029 Acute pharyngitis, unspecified: Secondary | ICD-10-CM

## 2020-11-08 LAB — POCT RAPID STREP A (OFFICE): Rapid Strep A Screen: NEGATIVE

## 2020-11-08 NOTE — Patient Instructions (Addendum)
Your rapid strep test came back negative. No throat culture available. Since 2 months since abnormal findings with negative study did not give tx. Did go ahead and referred to ENT.   Also placed referral for skin cancer screening.   Update me by 2 weeks if you have not heard from specialist.  Follow up 2 month early am appt so will get a1c, cmp and lipid panel.(Follow chronic med problems.) Return sooner if needed.

## 2020-11-08 NOTE — Progress Notes (Signed)
Subjective:    Patient ID: Anthony Russell, male    DOB: Jun 20, 1980, 40 y.o.   MRN: [id number]  HPI   Pt in for follow up.     This was to discuss ct findings regarding ct  and mri imaging.    IMPRESSION: MRI 1. No evidence of acute intracranial abnormality. 2. Asymmetrically enlarged left palatine tonsil seen on same day CT is incompletely imaged.  IMPRESSION: CT . Asymmetry of the palatine tonsils, larger on the left. No mass-like enhancement or cervical lymphadenopathy. Still, follow-up with ENT for direct inspection of the tonsil would be ideal.  Pt states occasional mild st on and off. Really never thought about if had throat pain until report finding.   Also moderate sized mole that he had some concerns. And rt side forearm bump that he has noted for year.   Review of Systems  Constitutional:  Negative for chills, fatigue and fever.  HENT:  Positive for sore throat.        Minimal at best.  Respiratory:  Negative for cough, chest tightness and shortness of breath.   Cardiovascular:  Negative for chest pain and palpitations.  Gastrointestinal:  Negative for abdominal pain.  Genitourinary:  Negative for dysuria, flank pain, frequency, hematuria, penile discharge and penile pain.  Musculoskeletal:  Negative for joint swelling and myalgias.    Past Medical History:  Diagnosis Date   Allergy    Allergy, unspecified not elsewhere classified    rx w/ OTC antihistamines PRN   Anxiety    Asthma    Atypical chest pain    recent neg eval w/ normal cxr/ekg   Depression    Dyspepsia    on protonix 40mg /d   GERD (gastroesophageal reflux disease)    on protonix 40mg /d   Hyperlipidemia    on diet alone   Lumbar back pain    s/p lumbar laminectomy 2007 by DrCabbell   Overweight(278.02)    weight Jan10=246#. .he was 225# in 9/07...diet and exercise was discussed     Social History   Socioeconomic History   Marital status: Single    Spouse name: Not on file    Number of children: 0   Years of education: Not on file   Highest education level: Not on file  Occupational History   Occupation: software   Tobacco Use   Smoking status: Former    Packs/day: 0.10    Years: 8.00    Pack years: 0.80    Types: Cigarettes    Quit date: 03/03/2011    Years since quitting: 9.6   Smokeless tobacco: Never  Vaping Use   Vaping Use: Never used  Substance and Sexual Activity   Alcohol use: Yes    Comment: rare   Drug use: No   Sexual activity: Not on file  Other Topics Concern   Not on file  Social History Narrative   3 cups caffeine a day   Social Determinants of Health   Financial Resource Strain: Not on file  Food Insecurity: Not on file  Transportation Needs: Not on file  Physical Activity: Not on file  Stress: Not on file  Social Connections: Not on file  Intimate Partner Violence: Not on file    Past Surgical History:  Procedure Laterality Date   LUMBAR LAMINECTOMY  2007   DrCabbell   TEAR DUCT PROBING      Family History  Problem Relation Age of Onset   Allergies Mother    Irritable bowel syndrome Mother  Allergies Father    Allergies Brother    Irritable bowel syndrome Brother    Colon cancer Neg Hx     No Known Allergies  Current Outpatient Medications on File Prior to Visit  Medication Sig Dispense Refill   albuterol (VENTOLIN HFA) 108 (90 Base) MCG/ACT inhaler Inhale 2 puffs into the lungs every 6 (six) hours as needed for wheezing or shortness of breath. 8 g 0   atorvastatin (LIPITOR) 10 MG tablet Take 1 tablet (10 mg total) by mouth daily. 30 tablet 11   butalbital-acetaminophen-caffeine (FIORICET) 50-325-40 MG tablet Take 1-2 tablets by mouth every 6 (six) hours as needed for headache. Max 6 per 24 hours 20 tablet 0   calcium carbonate (OS-CAL) 1250 (500 Ca) MG chewable tablet Chew 2 tablets by mouth daily.     fluticasone (FLOVENT HFA) 110 MCG/ACT inhaler Inhale 2 puffs into the lungs 2 (two) times daily. 1 each 0    L-Arginine 500 MG CAPS Take 2 capsules by mouth 2 (two) times daily.     levocetirizine (XYZAL) 5 MG tablet Take 1 tablet (5 mg total) by mouth every evening. 30 tablet 3   metFORMIN (GLUCOPHAGE) 1000 MG tablet TAKE 1 TABLET(1000 MG) BY MOUTH TWICE DAILY WITH A MEAL 60 tablet 5   Multiple Vitamin (MULTIVITAMIN ADULT PO) Take 2 tablets by mouth daily.     Nutritional Supplements (PYCNOGENOL) 300-30 MG CAPS Take 1 capsule by mouth 3 (three) times daily.     pantoprazole (PROTONIX) 40 MG tablet Take 1 tablet (40 mg total) by mouth daily. 90 tablet 3   propranolol (INDERAL) 20 MG tablet Take 1 tablet (20 mg total) by mouth 2 (two) times daily. 60 tablet 3   indomethacin (INDOCIN) 25 MG capsule Take 1 capsule (25 mg total) by mouth 3 (three) times daily with meals. For five days. 15 capsule 2   No current facility-administered medications on file prior to visit.    BP 127/69   Pulse 87   Temp 98.2 F (36.8 C)   Resp 18   Ht 5\' 11"  (1.803 m)   Wt 240 lb (108.9 kg)   SpO2 99%   BMI 33.47 kg/m       Objective:   Physical Exam   General- No acute distress. Pleasant patient. Neck- Full range of motion, no jvd Lungs- Clear, even and unlabored. Heart- regular rate and rhythm. Neurologic- CNII- XII grossly intact.  Heent- moderate enlarged tonsils. But larger left side that is asymettric.  Skin- moderate sized mole left upper back about 9-10 mm. Light brown and irregular. Rt forearm. Can feel small bump but can't see.    Assessment & Plan:   Patient Instructions  Your rapid strep test came back negative. No throat culture available. Since 2 months since abnormal findings with negative study did not give tx. Did go ahead and referred to ENT.   Also placed referral for skin cancer screening.   Update me by 2 weeks if you have not heard from specialist.  Follow up 2 month early am appt so will get a1c, cmp and lipid panel.   , PA-C

## 2020-11-16 ENCOUNTER — Ambulatory Visit (INDEPENDENT_AMBULATORY_CARE_PROVIDER_SITE_OTHER): Payer: BC Managed Care – PPO | Admitting: Medical

## 2020-11-16 ENCOUNTER — Other Ambulatory Visit: Payer: Self-pay

## 2020-11-16 VITALS — BP 125/75 | HR 91 | Resp 18 | Ht 71.0 in | Wt 244.0 lb

## 2020-11-16 DIAGNOSIS — R1013 Epigastric pain: Secondary | ICD-10-CM

## 2020-11-16 DIAGNOSIS — E119 Type 2 diabetes mellitus without complications: Secondary | ICD-10-CM | POA: Diagnosis not present

## 2020-11-16 DIAGNOSIS — Z23 Encounter for immunization: Secondary | ICD-10-CM

## 2020-11-16 DIAGNOSIS — K219 Gastro-esophageal reflux disease without esophagitis: Secondary | ICD-10-CM

## 2020-11-16 MED ORDER — FAMOTIDINE 20 MG PO TABS
20.0000 mg | ORAL_TABLET | Freq: Every day | ORAL | 3 refills | Status: DC
Start: 2020-11-16 — End: 2020-12-16

## 2020-11-16 NOTE — Progress Notes (Signed)
Subjective:    Patient ID: Anthony Russell, male    DOB: May 02, 1980, 40 y.o.   MRN: [id number]  HPI Pt in states on Sunday morning around 3 am he had pressure in upper stomach region. He points to mid stomach and  xyphoid process. Sunday night occurred again when laying down flat on his back. He notes if he reclines partially won't have   Pt does report using pillows at times to help with reflux. He states can taste acid in his mouth.   Pt states not coughing. No wheezing.   Pt states his egastric/xyphoid process region pain felt better after using netty pott.  Some belching recently.   Saturday night at pork roast Sunday night . Also on weekend at some hush puppies.  Pt states not having chest pain. No shortness of breath. Ekg done in past normal. Feels like has to breath deeper briefly with epigastric pain. He states did not have sob.  No leg pain.  Takes protonix only twice a week.  Review of Systems  Constitutional:  Negative for chills, fatigue and fever.  Respiratory:  Negative for chest tightness, shortness of breath and wheezing.   Cardiovascular:  Negative for chest pain and palpitations.  Gastrointestinal:  Positive for abdominal pain. Negative for abdominal distention, anal bleeding, diarrhea, nausea, rectal pain and vomiting.  Genitourinary:  Negative for dysuria and enuresis.  Musculoskeletal:  Negative for back pain.  Skin:  Negative for rash.    Past Medical History:  Diagnosis Date   Allergy    Allergy, unspecified not elsewhere classified    rx w/ OTC antihistamines PRN   Anxiety    Asthma    Atypical chest pain    recent neg eval w/ normal cxr/ekg   Depression    Dyspepsia    on protonix 40mg /d   GERD (gastroesophageal reflux disease)    on protonix 40mg /d   Hyperlipidemia    on diet alone   Lumbar back pain    s/p lumbar laminectomy 2007 by DrCabbell   Overweight(278.02)    weight Jan10=246#. .he was 225# in 9/07...diet and exercise was discussed      Social History   Socioeconomic History   Marital status: Single    Spouse name: Not on file   Number of children: 0   Years of education: Not on file   Highest education level: Not on file  Occupational History   Occupation: software   Tobacco Use   Smoking status: Former    Packs/day: 0.10    Years: 8.00    Pack years: 0.80    Types: Cigarettes    Quit date: 03/03/2011    Years since quitting: 9.7   Smokeless tobacco: Never  Vaping Use   Vaping Use: Never used  Substance and Sexual Activity   Alcohol use: Yes    Comment: rare   Drug use: No   Sexual activity: Not on file  Other Topics Concern   Not on file  Social History Narrative   3 cups caffeine a day   Social Determinants of Health   Financial Resource Strain: Not on file  Food Insecurity: Not on file  Transportation Needs: Not on file  Physical Activity: Not on file  Stress: Not on file  Social Connections: Not on file  Intimate Partner Violence: Not on file    Past Surgical History:  Procedure Laterality Date   LUMBAR LAMINECTOMY  2007   DrCabbell   TEAR DUCT PROBING  Family History  Problem Relation Age of Onset   Allergies Mother    Irritable bowel syndrome Mother    Allergies Father    Allergies Brother    Irritable bowel syndrome Brother    Colon cancer Neg Hx     No Known Allergies  Current Outpatient Medications on File Prior to Visit  Medication Sig Dispense Refill   albuterol (VENTOLIN HFA) 108 (90 Base) MCG/ACT inhaler Inhale 2 puffs into the lungs every 6 (six) hours as needed for wheezing or shortness of breath. 8 g 0   atorvastatin (LIPITOR) 10 MG tablet Take 1 tablet (10 mg total) by mouth daily. 30 tablet 11   butalbital-acetaminophen-caffeine (FIORICET) 50-325-40 MG tablet Take 1-2 tablets by mouth every 6 (six) hours as needed for headache. Max 6 per 24 hours 20 tablet 0   calcium carbonate (OS-CAL) 1250 (500 Ca) MG chewable tablet Chew 2 tablets by mouth daily.      fluticasone (FLOVENT HFA) 110 MCG/ACT inhaler Inhale 2 puffs into the lungs 2 (two) times daily. 1 each 0   indomethacin (INDOCIN) 25 MG capsule Take 1 capsule (25 mg total) by mouth 3 (three) times daily with meals. For five days. 15 capsule 2   L-Arginine 500 MG CAPS Take 2 capsules by mouth 2 (two) times daily.     levocetirizine (XYZAL) 5 MG tablet Take 1 tablet (5 mg total) by mouth every evening. 30 tablet 3   metFORMIN (GLUCOPHAGE) 1000 MG tablet TAKE 1 TABLET(1000 MG) BY MOUTH TWICE DAILY WITH A MEAL 60 tablet 5   Multiple Vitamin (MULTIVITAMIN ADULT PO) Take 2 tablets by mouth daily.     Nutritional Supplements (PYCNOGENOL) 300-30 MG CAPS Take 1 capsule by mouth 3 (three) times daily.     pantoprazole (PROTONIX) 40 MG tablet Take 1 tablet (40 mg total) by mouth daily. 90 tablet 3   propranolol (INDERAL) 20 MG tablet Take 1 tablet (20 mg total) by mouth 2 (two) times daily. 60 tablet 3   No current facility-administered medications on file prior to visit.    BP 125/75   Pulse 91   Resp 18   Ht 5\' 11"  (1.803 m)   Wt 244 lb (110.7 kg)   SpO2 98%   BMI 34.03 kg/m       Objective:   Physical Exam   General- No acute distress. Pleasant patient. Neck- Full range of motion, no jvd Lungs- Clear, even and unlabored. Heart- regular rate and rhythm. Neurologic- CNII- XII grossly intact.  Abdomen- soft, nd, +bs, no rebound or guarding. No organomegally. Epigastric tenderness to palpation. When lies supine pain is worse. Remove pillow  and when completely lied flat pain seemed to radiate from stomach to lower chest.  [place]ee ext-no pedal edema. Negative homans signs.    Assessment & Plan:   Patient Instructions  Recent epigastric pain described worse when lying flat which often is consistent with gerd/reflux.   We gave you gi cocktail in office today.  Take protonix daily and adding famotadine 20 mg to take 1 tab at night.    Recommend strict healthy diet.   For history of  diabetes will get cmp and a1c today.   Include lipase and cbc  for abd pain.  Follow up in 10 days or sooner if needed.    Time spent with patient today was 30  minutes which consisted of chart revdiew, discussing diagnosis, work up treatment and documentation.

## 2020-11-16 NOTE — Patient Instructions (Addendum)
Recent epigastric pain described worse when lying flat which often is consistent with gerd/reflux.   We gave you gi cocktail in office today.  Take protonix daily and adding famotadine 20 mg to take 1 tab at night.    Recommend strict healthy diet.   For history of diabetes will get cmp and a1c today.   Include lipase and cbc  for abd pain.  Follow up in 10 days or sooner if needed.

## 2020-11-17 LAB — COMPREHENSIVE METABOLIC PANEL
ALT: 25 U/L (ref 0–53)
AST: 14 U/L (ref 0–37)
Albumin: 4.4 g/dL (ref 3.5–5.2)
Alkaline Phosphatase: 62 U/L (ref 39–117)
BUN: 13 mg/dL (ref 6–23)
CO2: 29 mEq/L (ref 19–32)
Calcium: 9.6 mg/dL (ref 8.4–10.5)
Chloride: 104 mEq/L (ref 96–112)
Creatinine, Ser: 0.97 mg/dL (ref 0.40–1.50)
GFR: 97.86 mL/min (ref >60.00)
Glucose, Bld: 239 mg/dL — ABNORMAL HIGH (ref 70–99)
Potassium: 4.3 mEq/L (ref 3.5–5.1)
Sodium: 140 mEq/L (ref 135–145)
Total Bilirubin: 0.3 mg/dL (ref 0.2–1.2)
Total Protein: 6.8 g/dL (ref 6.0–8.3)

## 2020-11-17 LAB — CBC WITH DIFFERENTIAL/PLATELET
Basophils Absolute: 0.1 10*3/uL (ref 0.0–0.1)
Basophils Relative: 1.1 % (ref 0.0–3.0)
Eosinophils Absolute: 0.1 10*3/uL (ref 0.0–0.7)
Eosinophils Relative: 1.5 % (ref 0.0–5.0)
HCT: 43.6 % (ref 39.0–52.0)
Hemoglobin: 14.9 g/dL (ref 13.0–17.0)
Lymphocytes Relative: 27.5 % (ref 12.0–46.0)
Lymphs Abs: 1.9 10*3/uL (ref 0.7–4.0)
MCHC: 34.1 g/dL (ref 30.0–36.0)
MCV: 88.5 fl (ref 78.0–100.0)
Monocytes Absolute: 0.5 10*3/uL (ref 0.1–1.0)
Monocytes Relative: 7 % (ref 3.0–12.0)
Neutro Abs: 4.3 10*3/uL (ref 1.4–7.7)
Neutrophils Relative %: 62.9 % (ref 43.0–77.0)
Platelets: 166 10*3/uL (ref 150.0–400.0)
RBC: 4.93 Mil/uL (ref 4.22–5.81)
RDW: 13.9 % (ref 11.5–15.5)
WBC: 6.9 10*3/uL (ref 4.0–10.5)

## 2020-11-17 LAB — LIPASE: Lipase: 34 U/L (ref 11.0–59.0)

## 2020-11-17 LAB — HEMOGLOBIN A1C: Hgb A1c MFr Bld: 7.5 % — ABNORMAL HIGH (ref 4.6–6.5)

## 2020-11-17 MED ORDER — DAPAGLIFLOZIN PROPANEDIOL 5 MG PO TABS: 5.0000 mg | ORAL_TABLET | Freq: Every day | ORAL | 3 refills | Status: DC

## 2020-11-17 NOTE — Addendum Note (Signed)
Addended by: Gwenevere Abbot on: 11/17/2020 01:09 PM   Modules accepted: Orders

## 2020-11-23 DIAGNOSIS — Z20822 Contact with and (suspected) exposure to covid-19: Secondary | ICD-10-CM | POA: Diagnosis not present

## 2020-11-29 ENCOUNTER — Ambulatory Visit (INDEPENDENT_AMBULATORY_CARE_PROVIDER_SITE_OTHER): Payer: BC Managed Care – PPO | Admitting: Medical

## 2020-11-29 ENCOUNTER — Other Ambulatory Visit: Payer: Self-pay

## 2020-11-29 VITALS — BP 134/77 | HR 94 | Temp 98.2°F | Resp 18 | Ht 71.0 in | Wt 241.0 lb

## 2020-11-29 DIAGNOSIS — E1169 Type 2 diabetes mellitus with other specified complication: Secondary | ICD-10-CM | POA: Diagnosis not present

## 2020-11-29 DIAGNOSIS — K219 Gastro-esophageal reflux disease without esophagitis: Secondary | ICD-10-CM | POA: Diagnosis not present

## 2020-11-29 DIAGNOSIS — E119 Type 2 diabetes mellitus without complications: Secondary | ICD-10-CM | POA: Diagnosis not present

## 2020-11-29 DIAGNOSIS — E785 Hyperlipidemia, unspecified: Secondary | ICD-10-CM

## 2020-11-29 NOTE — Progress Notes (Signed)
Subjective:    Patient ID: Anthony Russell, male    DOB: October 18, 1980, 40 y.o.   MRN: [id number]  HPI  Pt in today for follow up.  He states his stomach feels better overall know but still uneasy upset stomach sensation with a lot of belching.   Pt states he no longer had abdomen pain. He states when given gi cocktail on last visit he noticed pain resolved within the hour. Pt is on both protonix and famotadine.  Labs were done and cbc, cmp and lipase normal.   Pt has diabetes and his a1c was 7.5 2 weeks ago. 7 months ago was 6.8. After reviewing lab I sent in farxiga since invokana was not covered.. Pt is already on metformin 1000 mg twice daily.  Pt has high cholesterol. He is taking atorvastatin on sporadic.     Review of Systems  Constitutional:  Negative for chills, fatigue and fever.  HENT:  Negative for congestion and postnasal drip.   Respiratory:  Negative for chest tightness, shortness of breath and wheezing.   Cardiovascular:  Negative for chest pain and palpitations.  Gastrointestinal:  Negative for abdominal distention, abdominal pain, constipation, diarrhea, nausea and vomiting.       See hpi.  Genitourinary:  Negative for dysuria, flank pain and frequency.  Musculoskeletal:  Negative for back pain, myalgias and neck pain.  Skin:  Negative for rash.    Past Medical History:  Diagnosis Date   Allergy    Allergy, unspecified not elsewhere classified    rx w/ OTC antihistamines PRN   Anxiety    Asthma    Atypical chest pain    recent neg eval w/ normal cxr/ekg   Depression    Dyspepsia    on protonix 40mg /d   GERD (gastroesophageal reflux disease)    on protonix 40mg /d   Hyperlipidemia    on diet alone   Lumbar back pain    s/p lumbar laminectomy 2007 by DrCabbell   Overweight(278.02)    weight Jan10=246#. .he was 225# in 9/07...diet and exercise was discussed     Social History   Socioeconomic History   Marital status: Single    Spouse name: Not on  file   Number of children: 0   Years of education: Not on file   Highest education level: Not on file  Occupational History   Occupation: software   Tobacco Use   Smoking status: Former    Packs/day: 0.10    Years: 8.00    Pack years: 0.80    Types: Cigarettes    Quit date: 03/03/2011    Years since quitting: 9.7   Smokeless tobacco: Never  Vaping Use   Vaping Use: Never used  Substance and Sexual Activity   Alcohol use: Yes    Comment: rare   Drug use: No   Sexual activity: Not on file  Other Topics Concern   Not on file  Social History Narrative   3 cups caffeine a day   Social Determinants of Health   Financial Resource Strain: Not on file  Food Insecurity: Not on file  Transportation Needs: Not on file  Physical Activity: Not on file  Stress: Not on file  Social Connections: Not on file  Intimate Partner Violence: Not on file    Past Surgical History:  Procedure Laterality Date   LUMBAR LAMINECTOMY  2007   DrCabbell   TEAR DUCT PROBING      Family History  Problem Relation Age of Onset  Allergies Mother    Irritable bowel syndrome Mother    Allergies Father    Allergies Brother    Irritable bowel syndrome Brother    Colon cancer Neg Hx     No Known Allergies  Current Outpatient Medications on File Prior to Visit  Medication Sig Dispense Refill   albuterol (VENTOLIN HFA) 108 (90 Base) MCG/ACT inhaler Inhale 2 puffs into the lungs every 6 (six) hours as needed for wheezing or shortness of breath. 8 g 0   atorvastatin (LIPITOR) 10 MG tablet Take 1 tablet (10 mg total) by mouth daily. 30 tablet 11   butalbital-acetaminophen-caffeine (FIORICET) 50-325-40 MG tablet Take 1-2 tablets by mouth every 6 (six) hours as needed for headache. Max 6 per 24 hours 20 tablet 0   calcium carbonate (OS-CAL) 1250 (500 Ca) MG chewable tablet Chew 2 tablets by mouth daily.     dapagliflozin propanediol (FARXIGA) 5 MG TABS tablet Take 1 tablet (5 mg total) by mouth daily. 30  tablet 3   famotidine (PEPCID) 20 MG tablet Take 1 tablet (20 mg total) by mouth at bedtime. 30 tablet 3   fluticasone (FLOVENT HFA) 110 MCG/ACT inhaler Inhale 2 puffs into the lungs 2 (two) times daily. 1 each 0   L-Arginine 500 MG CAPS Take 2 capsules by mouth 2 (two) times daily.     levocetirizine (XYZAL) 5 MG tablet Take 1 tablet (5 mg total) by mouth every evening. 30 tablet 3   metFORMIN (GLUCOPHAGE) 1000 MG tablet TAKE 1 TABLET(1000 MG) BY MOUTH TWICE DAILY WITH A MEAL 60 tablet 5   Multiple Vitamin (MULTIVITAMIN ADULT PO) Take 2 tablets by mouth daily.     Nutritional Supplements (PYCNOGENOL) 300-30 MG CAPS Take 1 capsule by mouth 3 (three) times daily.     pantoprazole (PROTONIX) 40 MG tablet Take 1 tablet (40 mg total) by mouth daily. 90 tablet 3   propranolol (INDERAL) 20 MG tablet Take 1 tablet (20 mg total) by mouth 2 (two) times daily. 60 tablet 3   indomethacin (INDOCIN) 25 MG capsule Take 1 capsule (25 mg total) by mouth 3 (three) times daily with meals. For five days. 15 capsule 2   No current facility-administered medications on file prior to visit.    BP 134/77   Pulse 94   Temp 98.2 F (36.8 C)   Resp 18   Ht 5\' 11"  (1.803 m)   Wt 241 lb (109.3 kg)   SpO2 99%   BMI 33.61 kg/m        Objective:   Physical Exam  General- No acute distress. Pleasant patient. Neck- Full range of motion, no jvd Lungs- Clear, even and unlabored. Heart- regular rate and rhythm. Neurologic- CNII- XII grossly intact.  Abdomen- soft, mild epigastric tenderenss, nd, +bs, no rebound or guarding.  Back- no cva tenderness.        Assessment & Plan:   Patient Instructions  symptoms recently improved with protonix and famotadine. Your symptoms are less know but still present. Continue current med but went ahead and placed referral to GI MD. If symptoms area still persisting by then you may need egd.  For diabetes not controlled continue metformin, low sugar diet, regular  exercise and adding farxiga. Repeat a1c in 3 months.  For high cholesterol continue atorvastatin. You described sporadic use. Advise to take daily or at least 3 times a week.  Will recheck fasting lipid panel in 3 months.   Follow up in 3 months or sooner  if needed.    Esperanza Richters, PA-C

## 2020-11-29 NOTE — Patient Instructions (Addendum)
Gerd symptoms recently improved with protonix and famotadine. Your symptoms are less know but still present. Continue current med but went ahead and placed referral to GI MD. If symptoms area still persisting by then you may need egd.  For diabetes not controlled continue metformin, low sugar diet, regular exercise and adding farxiga. Repeat a1c in 3 months.  For high cholesterol continue atorvastatin. You described sporadic use. Advise to take daily or at least 3 times a week.  Will recheck fasting lipid panel in 3 months.   Follow up in 3 months or sooner if needed.

## 2020-12-01 DIAGNOSIS — J358 Other chronic diseases of tonsils and adenoids: Secondary | ICD-10-CM | POA: Diagnosis not present

## 2020-12-16 ENCOUNTER — Ambulatory Visit (INDEPENDENT_AMBULATORY_CARE_PROVIDER_SITE_OTHER): Payer: BC Managed Care – PPO | Admitting: Physician Assistant

## 2020-12-16 ENCOUNTER — Other Ambulatory Visit (INDEPENDENT_AMBULATORY_CARE_PROVIDER_SITE_OTHER): Payer: BC Managed Care – PPO

## 2020-12-16 ENCOUNTER — Encounter: Payer: Self-pay | Admitting: Physician Assistant

## 2020-12-16 VITALS — BP 104/66 | HR 92 | Ht 68.5 in | Wt 241.4 lb

## 2020-12-16 DIAGNOSIS — K219 Gastro-esophageal reflux disease without esophagitis: Secondary | ICD-10-CM | POA: Diagnosis not present

## 2020-12-16 DIAGNOSIS — R194 Change in bowel habit: Secondary | ICD-10-CM

## 2020-12-16 DIAGNOSIS — R1010 Upper abdominal pain, unspecified: Secondary | ICD-10-CM

## 2020-12-16 LAB — COMPREHENSIVE METABOLIC PANEL
ALT: 26 U/L (ref 0–53)
AST: 15 U/L (ref 0–37)
Albumin: 4.5 g/dL (ref 3.5–5.2)
Alkaline Phosphatase: 59 U/L (ref 39–117)
BUN: 15 mg/dL (ref 6–23)
CO2: 31 mEq/L (ref 19–32)
Calcium: 9.7 mg/dL (ref 8.4–10.5)
Chloride: 102 mEq/L (ref 96–112)
Creatinine, Ser: 1 mg/dL (ref 0.40–1.50)
GFR: 94.3 mL/min (ref >60.00)
Glucose, Bld: 197 mg/dL — ABNORMAL HIGH (ref 70–99)
Potassium: 4 mEq/L (ref 3.5–5.1)
Sodium: 141 mEq/L (ref 135–145)
Total Bilirubin: 0.6 mg/dL (ref 0.2–1.2)
Total Protein: 7.1 g/dL (ref 6.0–8.3)

## 2020-12-16 LAB — CBC WITH DIFFERENTIAL/PLATELET
Basophils Absolute: 0 10*3/uL (ref 0.0–0.1)
Basophils Relative: 0.5 % (ref 0.0–3.0)
Eosinophils Absolute: 0.1 10*3/uL (ref 0.0–0.7)
Eosinophils Relative: 1.4 % (ref 0.0–5.0)
HCT: 43.9 % (ref 39.0–52.0)
Hemoglobin: 14.8 g/dL (ref 13.0–17.0)
Lymphocytes Relative: 25.1 % (ref 12.0–46.0)
Lymphs Abs: 1.6 10*3/uL (ref 0.7–4.0)
MCHC: 33.6 g/dL (ref 30.0–36.0)
MCV: 88 fl (ref 78.0–100.0)
Monocytes Absolute: 0.5 10*3/uL (ref 0.1–1.0)
Monocytes Relative: 7.1 % (ref 3.0–12.0)
Neutro Abs: 4.3 10*3/uL (ref 1.4–7.7)
Neutrophils Relative %: 65.9 % (ref 43.0–77.0)
Platelets: 166 10*3/uL (ref 150.0–400.0)
RBC: 4.99 Mil/uL (ref 4.22–5.81)
RDW: 14.3 % (ref 11.5–15.5)
WBC: 6.5 10*3/uL (ref 4.0–10.5)

## 2020-12-16 LAB — SEDIMENTATION RATE: Sed Rate: 6 mm/hr (ref 0–15)

## 2020-12-16 MED ORDER — PANTOPRAZOLE SODIUM 40 MG PO TBEC: 40.0000 mg | DELAYED_RELEASE_TABLET | Freq: Two times a day (BID) | ORAL | 6 refills | Status: DC

## 2020-12-16 MED ORDER — GLYCOPYRROLATE 2 MG PO TABS: 2.0000 mg | ORAL_TABLET | Freq: Two times a day (BID) | ORAL | 2 refills | Status: DC | PRN

## 2020-12-16 MED ORDER — FAMOTIDINE 20 MG PO TABS: 20.0000 mg | ORAL_TABLET | Freq: Every day | ORAL | 3 refills | Status: DC

## 2020-12-16 NOTE — Progress Notes (Signed)
Subjective:    Patient ID: Anthony Russell, male    DOB: [birth year], 40 y.o.   MRN: [id number]  HPI Anthony Russell is a 40 year old white male, new to GI today referred by Ed Saguier PA-C for evaluation of epigastric pain.  He had been seen here remotely by Dr. Deatra Ina, and had undergone EGD in 2010 which was normal. Patient has history of GERD, IBS, asthma, anxiety/depression, adult onset diabetes mellitus and obesity. He reports that his current symptoms started about a month ago with epigastric pain that seems to be exacerbated by lying down, and is bothersome to the point that it is keeping him from sleeping.  He says once this discomfort starts he has a rolling "" unsettled feeling in his upper abdomen which sometimes causes the urge for a bowel movement.  He says he usually will have a bowel movement and then will continue to have a sensation like he needs to go to the bathroom but does not have any further stools.  No melena or hematochezia, no associated nausea or vomiting.[name] He has been on Protonix 40 mg p.o. daily long-term which she says has helped with reflux symptoms but he generally still has in acid he taste in his mouth every morning.  He tries to follow antireflux precautions uses a wedge pillow to sleep etc.  He was just started on Pepcid in addition to the pantoprazole and has been taking that in the evenings, thus far with little benefit.  He does endorse heartburn and indigestion, appetite has been fair, no weight loss.  No regular use of aspirin or NSAIDs.  No dysphagia or odynophagia. No new medications or supplements.  No regular EtOH use. Recent labs on 11/16/2020 with normal CBC, unremarkable c-Met and negative lipase.  Review of Systems Pertinent positive and negative review of systems were noted in the above HPI section.  All other review of systems was otherwise negative.   Outpatient Encounter Medications as of 12/16/2020  Medication Sig   albuterol (VENTOLIN HFA) 108 (90 Base)  MCG/ACT inhaler Inhale 2 puffs into the lungs every 6 (six) hours as needed for wheezing or shortness of breath.   atorvastatin (LIPITOR) 10 MG tablet Take 1 tablet (10 mg total) by mouth daily.   calcium carbonate (OS-CAL) 1250 (500 Ca) MG chewable tablet Chew 2 tablets by mouth daily.   dapagliflozin propanediol (FARXIGA) 5 MG TABS tablet Take 1 tablet (5 mg total) by mouth daily.   fluticasone (FLOVENT HFA) 110 MCG/ACT inhaler Inhale 2 puffs into the lungs 2 (two) times daily.   glycopyrrolate (ROBINUL) 2 MG tablet Take 1 tablet (2 mg total) by mouth 2 (two) times daily as needed (Abdominal disccomfort).   levocetirizine (XYZAL) 5 MG tablet Take 1 tablet (5 mg total) by mouth every evening.   metFORMIN (GLUCOPHAGE) 1000 MG tablet TAKE 1 TABLET(1000 MG) BY MOUTH TWICE DAILY WITH A MEAL   Multiple Vitamin (MULTIVITAMIN ADULT PO) Take 2 tablets by mouth daily.   Nutritional Supplements (PYCNOGENOL) 300-30 MG CAPS Take 1 capsule by mouth 3 (three) times daily.   propranolol (INDERAL) 20 MG tablet Take 1 tablet (20 mg total) by mouth 2 (two) times daily.   [DISCONTINUED] famotidine (PEPCID) 20 MG tablet Take 1 tablet (20 mg total) by mouth at bedtime.   [DISCONTINUED] pantoprazole (PROTONIX) 40 MG tablet Take 1 tablet (40 mg total) by mouth daily.   famotidine (PEPCID) 20 MG tablet Take 1 tablet (20 mg total) by mouth at bedtime.   indomethacin (  INDOCIN) 25 MG capsule Take 1 capsule (25 mg total) by mouth 3 (three) times daily with meals. For five days. (Patient not taking: Reported on 12/16/2020)   L-Arginine 500 MG CAPS Take 2 capsules by mouth 2 (two) times daily. (Patient not taking: Reported on 12/16/2020)   pantoprazole (PROTONIX) 40 MG tablet Take 1 tablet (40 mg total) by mouth 2 (two) times daily before a meal.   [DISCONTINUED] butalbital-acetaminophen-caffeine (FIORICET) 50-325-40 MG tablet Take 1-2 tablets by mouth every 6 (six) hours as needed for headache. Max 6 per 24 hours   No  facility-administered encounter medications on file as of 12/16/2020.   No Known Allergies Patient Active Problem List   Diagnosis Date Noted   Subacromial bursitis of right shoulder joint 05/17/2020   Hypersomnia 11/03/2015   Obesity 11/03/2015   Acute bacterial bronchitis 01/18/2015   Allergic rhinitis 04/15/2014   Asthma in adult 04/15/2014   GERD (gastroesophageal reflux disease) 04/15/2014   IBS (irritable bowel syndrome) 04/01/2013   Nausea alone 01/15/2013   Diarrhea 01/15/2013   Abdominal cramping 01/15/2013   Depression 10/15/2012   Lesion of eyebrow 02/09/2012   Reactive airways dysfunction syndrome ([place]) 07/05/2011   Gastroparesis 07/05/2011   Anxiety 07/05/2010   TESTOSTERONE DEFICIENCY 05/26/2008   FATIGUE 05/25/2008   ABDOMINAL BLOATING 04/03/2008   HYPERLIPIDEMIA 02/06/2008   OVERWEIGHT 02/06/2008   Facet arthropathy, lumbar 02/06/2008   CHEST PAIN, ATYPICAL 02/06/2008   GERD 11/19/2007   ALLERGY 11/18/2007   Social History   Socioeconomic History   Marital status: Single    Spouse name: Not on file   Number of children: 0   Years of education: Not on file   Highest education level: Not on file  Occupational History   Occupation: Gaffer  Tobacco Use   Smoking status: Former    Packs/day: 0.10    Years: 8.00    Pack years: 0.80    Types: Cigarettes    Quit date: 03/03/2011    Years since quitting: 9.7   Smokeless tobacco: Never  Vaping Use   Vaping Use: Never used  Substance and Sexual Activity   Alcohol use: Yes    Comment: once or twice a month   Drug use: No   Sexual activity: Not on file  Other Topics Concern   Not on file  Social History Narrative   3 cups caffeine a day   Social Determinants of Health   Financial Resource Strain: Not on file  Food Insecurity: Not on file  Transportation Needs: Not on file  Physical Activity: Not on file  Stress: Not on file  Social Connections: Not on file  Intimate Partner Violence:  Not on file    [name] family history includes Allergies in his brother, father, and mother; Breast cancer in his maternal aunt; Diabetes in his father and mother; Heart disease in his brother; Hyperlipidemia in his father; Irritable bowel syndrome in his brother and mother.      Objective:    Vitals:   12/16/20 0928  BP: 104/66  Pulse: 92    Physical Exam Well-developed well-nourished WM in no acute distress.  Height, ITGPQD,826  BMI 36.1  HEENT; nontraumatic normocephalic, EOMI, PE R LA, sclera anicteric. Oropharynx; not examined Neck; supple, no JVD Cardiovascular; regular rate and rhythm with S1-S2, no murmur rub or gallop Pulmonary; Clear bilaterally Abdomen; soft, obese, nondistended, there is tenderness in the epigastrium and hypogastrium with some guarding ,no palpable mass or hepatosplenomegaly, bowel sounds are active Rectal;  not done today Skin; benign exam, no jaundice rash or appreciable lesions Extremities; no clubbing cyanosis or edema skin warm and dry Neuro/Psych; alert and oriented x4, grossly nonfocal mood and affect appropriate        Assessment & Plan:   #41 40 year old white male with 1 month history of constant upper abdominal pain, primarily in the epigastrium exacerbated by lying down, and frequently associated with a rolling unsettled sensation which usually precipitates a bowel movement. Patient does have history of chronic GERD, has been on chronic Protonix, PCP added Pepcid over the past couple of weeks thus far with little benefit.  Etiology of patient's current epigastric abdominal pain is not entirely clear, unusual for symptoms to be quickly exacerbated by lying down. Rule out gastropathy, peptic ulcer disease, I do not think that the epigastric pain represents GERD, but possible, rule out other intra-abdominal inflammatory process  #2 adult onset diabetes mellitus #3.  Chronic anxiety/depression #4.  IBS  Plan; Will increase Protonix to  40 mg p.o. twice daily AC breakfast and AC dinner Continue famotidine/Pepcid 20 mg, have asked him to take this 1 to 2 hours prior to bedtime Trial of glycopyrrolate 2 mg p.o. twice daily CBC with differential, c-Met, sed rate Scheduled for CT of the abdomen and pelvis with contrast. If CT is unremarkable and symptoms are persisting he will likely need EGD with Dr. Lorenso Courier. Patient will be established with Dr. Lorenso Courier.  Anthony Russell Genia Harold PA-C 12/16/2020   Cc: Mackie Pai, PA-C

## 2020-12-16 NOTE — Patient Instructions (Signed)
If you are age 40 or younger, your body mass index should be between 19-25. Your Body mass index is 36.17 kg/m. If this is out of the aformentioned range listed, please consider follow up with your Primary Care Provider.  ________________________________________________________  The [facility] providers would like to encourage you to use [facility] to communicate with providers for non-urgent requests or questions.  Due to long hold times on the telephone, sending your provider a message by [facility] may be a faster and more efficient way to get a response.  Please allow 48 business hours for a response.  Please remember that this is for non-urgent requests.  _______________________________________________________  Dennis Bast have been scheduled for a CT scan of the abdomen and pelvis at [facility] ([address]---this is in the same building as [employer]).   You are scheduled on 12/27/2020 at 4:00 pm. You should arrive 15 minutes prior to your appointment time for registration. Please follow the written instructions below on the day of your exam:  WARNING: IF YOU ARE ALLERGIC TO IODINE/X-RAY DYE, PLEASE NOTIFY RADIOLOGY IMMEDIATELY AT [phone]! YOU WILL BE GIVEN A 13 HOUR PREMEDICATION PREP.  1) Do not eat or drink anything after 12:00 pm (4 hours prior to your test) 2) You have been given 2 bottles of oral contrast to drink. The solution may taste better if refrigerated, but do NOT add ice or any other liquid to this solution. Shake well before drinking.    Drink 1 bottle of contrast @ 2:00 pm (2 hours prior to your exam)  Drink 1 bottle of contrast @ 3:00 pm (1 hour prior to your exam)  You may take any medications as prescribed with a small amount of water, if necessary. If you take any of the following medications: METFORMIN, GLUCOPHAGE, GLUCOVANCE, AVANDAMET, RIOMET, FORTAMET, [place] MET, JANUMET, GLUMETZA or METAGLIP, you MAY be asked to HOLD this medication 48 hours  AFTER the exam.  The purpose of you drinking the oral contrast is to aid in the visualization of your intestinal tract. The contrast solution may cause some diarrhea. Depending on your individual set of symptoms, you may also receive an intravenous injection of x-ray contrast/dye. Plan on being at [facility] for 30 minutes or longer, depending on the type of exam you are having performed.  This test typically takes 30-45 minutes to complete.  If you have any questions regarding your exam or if you need to reschedule, you may call the CT department at [phone] between the hours of 8:00 am and 5:00 pm, Monday-Friday.  ____________________________________________________________  Your provider has requested that you go to the basement level for lab work before leaving today. Press "B" on the elevator. The lab is located at the first door on the left as you exit the elevator.  Increase Pantoprazole 40 mg 1 tablet twice daily before meals.  Continue Famotidine 20 mg 1 tablet before bedtime.   START Glycopyrrolate 2 mg 1 tablet twice daily as needed for abdominal discomfort.  Follow up pending the results of your CT.  Thank you for entrusting me with your care and choosing [facility].  Amy Esterwood, PA-C

## 2020-12-19 NOTE — Progress Notes (Signed)
I agree with the assessment and plan as outlined by Ms. Esterwood. 

## 2020-12-22 ENCOUNTER — Other Ambulatory Visit: Payer: Self-pay | Admitting: Medical

## 2020-12-27 ENCOUNTER — Other Ambulatory Visit: Payer: Self-pay

## 2020-12-27 ENCOUNTER — Ambulatory Visit (INDEPENDENT_AMBULATORY_CARE_PROVIDER_SITE_OTHER)
Admission: RE | Admit: 2020-12-27 | Discharge: 2020-12-27 | Disposition: A | Discharge status: Home / Self Care | Payer: BC Managed Care – PPO | Source: Ambulatory Visit | Attending: Nurse Practitioner | Admitting: Nurse Practitioner

## 2020-12-27 DIAGNOSIS — K219 Gastro-esophageal reflux disease without esophagitis: Secondary | ICD-10-CM | POA: Diagnosis not present

## 2020-12-27 DIAGNOSIS — R109 Unspecified abdominal pain: Secondary | ICD-10-CM | POA: Diagnosis not present

## 2020-12-27 DIAGNOSIS — R194 Change in bowel habit: Secondary | ICD-10-CM

## 2020-12-27 DIAGNOSIS — R1010 Upper abdominal pain, unspecified: Secondary | ICD-10-CM | POA: Diagnosis not present

## 2020-12-27 MED ORDER — IOHEXOL 300 MG/ML  SOLN
100.0000 mL | Freq: Once | INTRAMUSCULAR | Status: AC | PRN
  Administered 2020-12-27: 100 mL via INTRAVENOUS

## 2021-01-15 ENCOUNTER — Telehealth: Payer: BC Managed Care – PPO | Admitting: Nurse Practitioner

## 2021-01-15 DIAGNOSIS — J069 Acute upper respiratory infection, unspecified: Secondary | ICD-10-CM | POA: Diagnosis not present

## 2021-01-15 NOTE — Progress Notes (Signed)
Virtual Visit Consent   STEPHANE NIEMANN, you are scheduled for a virtual visit with a [facility] provider today.     Just as with appointments in the office, your consent must be obtained to participate.  Your consent will be active for this visit and any virtual visit you may have with one of our providers in the next 365 days.     If you have a MyChart account, a copy of this consent can be sent to you electronically.  All virtual visits are billed to your insurance company just like a traditional visit in the office.    As this is a virtual visit, video technology does not allow for your provider to perform a traditional examination.  This may limit your provider's ability to fully assess your condition.  If your provider identifies any concerns that need to be evaluated in person or the need to arrange testing (such as labs, EKG, etc.), we will make arrangements to do so.     Although advances in technology are sophisticated, we cannot ensure that it will always work on either your end or our end.  If the connection with a video visit is poor, the visit may have to be switched to a telephone visit.  With either a video or telephone visit, we are not always able to ensure that we have a secure connection.     I need to obtain your verbal consent now.   Are you willing to proceed with your visit today?    Anthony Russell has provided verbal consent on 01/15/2021 for a virtual visit (video or telephone).   [name], FNP   Date: 01/15/2021 5:36 PM   Virtual Visit via Video Note   I, [name], connected with  Anthony Russell  ([id number], 06/15/1980) on 01/15/21 at  5:45 PM EST by a video-enabled telemedicine application and verified that I am speaking with the correct person using two identifiers.  Location: Patient: Virtual Visit Location Patient: Home Provider: Virtual Visit Location Provider: Home Office   I discussed the limitations of evaluation and management by telemedicine  and the availability of in person appointments. The patient expressed understanding and agreed to proceed.    History of Present Illness: Anthony Russell is a 40 y.o. who identifies as a male who was assigned male at birth, and is being seen today with complaints of a sore throat that has persisted over the past 3 days. He took Mucinex and throat lozenges with some relief.   He has also been using Xyzal.   He does have some nasal congestion without cough.   Denies any known sick contacts.   Denies a history of COVID.   He did have a flu shot this year.    Problems:  Patient Active Problem List   Diagnosis Date Noted   Subacromial bursitis of right shoulder joint 05/17/2020   Hypersomnia 11/03/2015   Obesity 11/03/2015   Acute bacterial bronchitis 01/18/2015   Allergic rhinitis 04/15/2014   Asthma in adult 04/15/2014   GERD (gastroesophageal reflux disease) 04/15/2014   IBS (irritable bowel syndrome) 04/01/2013   Nausea alone 01/15/2013   Diarrhea 01/15/2013   Abdominal cramping 01/15/2013   Depression 10/15/2012   Lesion of eyebrow 02/09/2012   Reactive airways dysfunction syndrome ([facility]) 07/05/2011   Gastroparesis 07/05/2011   Anxiety 07/05/2010   TESTOSTERONE DEFICIENCY 05/26/2008   FATIGUE 05/25/2008   ABDOMINAL BLOATING 04/03/2008   HYPERLIPIDEMIA 02/06/2008   OVERWEIGHT 02/06/2008  Facet arthropathy, lumbar 02/06/2008   CHEST PAIN, ATYPICAL 02/06/2008   GERD 11/19/2007   ALLERGY 11/18/2007    Allergies: No Known Allergies Medications:  Current Outpatient Medications:    albuterol (VENTOLIN HFA) 108 (90 Base) MCG/ACT inhaler, Inhale 2 puffs into the lungs every 6 (six) hours as needed for wheezing or shortness of breath., Disp: 8 g, Rfl: 0   atorvastatin (LIPITOR) 10 MG tablet, Take 1 tablet (10 mg total) by mouth daily., Disp: 30 tablet, Rfl: 11   calcium carbonate (OS-CAL) 1250 (500 Ca) MG chewable tablet, Chew 2 tablets by mouth daily., Disp: , Rfl:     dapagliflozin propanediol (FARXIGA) 5 MG TABS tablet, Take 1 tablet (5 mg total) by mouth daily., Disp: 30 tablet, Rfl: 3   famotidine (PEPCID) 20 MG tablet, Take 1 tablet (20 mg total) by mouth at bedtime., Disp: 30 tablet, Rfl: 3   fluticasone (FLOVENT HFA) 110 MCG/ACT inhaler, Inhale 2 puffs into the lungs 2 (two) times daily., Disp: 1 each, Rfl: 0   glycopyrrolate (ROBINUL) 2 MG tablet, Take 1 tablet (2 mg total) by mouth 2 (two) times daily as needed (Abdominal disccomfort)., Disp: 60 tablet, Rfl: 2   indomethacin (INDOCIN) 25 MG capsule, Take 1 capsule (25 mg total) by mouth 3 (three) times daily with meals. For five days. (Patient not taking: Reported on 12/16/2020), Disp: 15 capsule, Rfl: 2   L-Arginine 500 MG CAPS, Take 2 capsules by mouth 2 (two) times daily. (Patient not taking: Reported on 12/16/2020), Disp: , Rfl:    levocetirizine (XYZAL) 5 MG tablet, Take 1 tablet (5 mg total) by mouth every evening., Disp: 30 tablet, Rfl: 3   metFORMIN (GLUCOPHAGE) 1000 MG tablet, TAKE 1 TABLET(1000 MG) BY MOUTH TWICE DAILY WITH A MEAL, Disp: 180 tablet, Rfl: 1   Multiple Vitamin (MULTIVITAMIN ADULT PO), Take 2 tablets by mouth daily., Disp: , Rfl:    Nutritional Supplements (PYCNOGENOL) 300-30 MG CAPS, Take 1 capsule by mouth 3 (three) times daily., Disp: , Rfl:    pantoprazole (PROTONIX) 40 MG tablet, Take 1 tablet (40 mg total) by mouth 2 (two) times daily before a meal., Disp: 60 tablet, Rfl: 6   propranolol (INDERAL) 20 MG tablet, Take 1 tablet (20 mg total) by mouth 2 (two) times daily., Disp: 60 tablet, Rfl: 3  Observations/Objective: Patient is well-developed, well-nourished in no acute distress.  Resting comfortably at home.  Head is normocephalic, atraumatic.  No labored breathing.  Speech is clear and coherent with logical content.  Patient is alert and oriented at baseline.    Assessment and Plan: 1. Viral upper respiratory tract infection Patient is planning to get COVID test  tomorrow Will continue to manage symptoms with OTC medications until that time.   Warm salt water gargles and lozenges for added comfort      Follow Up Instructions: I discussed the assessment and treatment plan with the patient. The patient was provided an opportunity to ask questions and all were answered. The patient agreed with the plan and demonstrated an understanding of the instructions.  A copy of instructions were sent to the patient via MyChart unless otherwise noted below.     The patient was advised to call back or seek an in-person evaluation if the symptoms worsen or if the condition fails to improve as anticipated.  Time:  I spent 10 minutes with the patient via telehealth technology discussing the above problems/concerns.    [name], FNP

## 2021-01-16 DIAGNOSIS — Z20822 Contact with and (suspected) exposure to covid-19: Secondary | ICD-10-CM | POA: Diagnosis not present

## 2021-01-17 NOTE — Progress Notes (Deleted)
° °  CC:  headaches  Follow-up Visit  Last visit: 10/19/20  Brief HPI: 40 year old male with a history of asthma, HLD, DM who follows in clinic for daily headaches following a thunderclap headache during intercourse on 09/04/20. CTA head/neck was negative for aneurysm and MRI brain showed no acute intracranial process.  At his last visit he was started on indomethacin to break his current headache.  Interval History: Indomethacin helped initially but then the headache returned. He was started on propranolol 20 mg BID for headache prevention.   Headache days per month: *** Headache free days per month: *** Headache severity: ***  Current Headache Regimen: Preventative: *** Abortive: ***  # of doses of abortive medications per month: ***  Prior Therapies                                  ***  Physical Exam:   Vital Signs: There were no vitals taken for this visit. GENERAL:  well appearing, in no acute distress, alert  SKIN:  Color, texture, turgor normal. No rashes or lesions HEAD:  Normocephalic/atraumatic. RESP: normal respiratory effort MSK:  No gross joint deformities.   NEUROLOGICAL: Mental Status: Alert, oriented to person, place and time, Follows commands, and Speech fluent and appropriate. Cranial Nerves: PERRL, face symmetric, no dysarthria, hearing grossly intact Motor: moves all extremities equally Gait: normal-based.  IMPRESSION: ***  PLAN: ***   Follow-up: ***  I spent a total of *** minutes on the date of the service. Headache education was done. Discussed lifestyle modification including increased oral hydration, decreased caffeine, exercise and stress management. Discussed treatment options including preventive and acute medications, natural supplements, and infusion therapy. Discussed medication overuse headache and to limit use of acute treatments to no more than 2 days/week or 10 days/month. Discussed medication side effects, adverse reactions and drug  interactions. Written educational materials and patient instructions outlining all of the above were given.  Ocie Doyne, MD

## 2021-01-18 ENCOUNTER — Telehealth: Payer: Self-pay | Admitting: Psychiatry

## 2021-01-18 ENCOUNTER — Telehealth: Payer: BC Managed Care – PPO | Admitting: Physician Assistant

## 2021-01-18 ENCOUNTER — Emergency Department (HOSPITAL_BASED_OUTPATIENT_CLINIC_OR_DEPARTMENT_OTHER)
Admission: EM | Admit: 2021-01-18 | Discharge: 2021-01-18 | Disposition: A | Discharge status: Home / Self Care | Payer: BC Managed Care – PPO | Attending: Emergency Medicine | Admitting: Emergency Medicine

## 2021-01-18 ENCOUNTER — Other Ambulatory Visit: Payer: Self-pay

## 2021-01-18 ENCOUNTER — Emergency Department (HOSPITAL_BASED_OUTPATIENT_CLINIC_OR_DEPARTMENT_OTHER): Payer: BC Managed Care – PPO

## 2021-01-18 ENCOUNTER — Encounter (HOSPITAL_BASED_OUTPATIENT_CLINIC_OR_DEPARTMENT_OTHER): Payer: Self-pay

## 2021-01-18 ENCOUNTER — Ambulatory Visit: Payer: BC Managed Care – PPO | Admitting: Psychiatry

## 2021-01-18 DIAGNOSIS — Z79899 Other long term (current) drug therapy: Secondary | ICD-10-CM | POA: Insufficient documentation

## 2021-01-18 DIAGNOSIS — Z87891 Personal history of nicotine dependence: Secondary | ICD-10-CM | POA: Diagnosis not present

## 2021-01-18 DIAGNOSIS — J0510 Acute epiglottitis without obstruction: Secondary | ICD-10-CM | POA: Diagnosis not present

## 2021-01-18 DIAGNOSIS — Z7984 Long term (current) use of oral hypoglycemic drugs: Secondary | ICD-10-CM | POA: Diagnosis not present

## 2021-01-18 DIAGNOSIS — Z20822 Contact with and (suspected) exposure to covid-19: Secondary | ICD-10-CM | POA: Diagnosis not present

## 2021-01-18 DIAGNOSIS — I10 Essential (primary) hypertension: Secondary | ICD-10-CM | POA: Insufficient documentation

## 2021-01-18 DIAGNOSIS — Z7951 Long term (current) use of inhaled steroids: Secondary | ICD-10-CM | POA: Insufficient documentation

## 2021-01-18 DIAGNOSIS — R000 Tachycardia, unspecified: Secondary | ICD-10-CM | POA: Diagnosis not present

## 2021-01-18 DIAGNOSIS — J020 Streptococcal pharyngitis: Secondary | ICD-10-CM

## 2021-01-18 DIAGNOSIS — J384 Edema of larynx: Secondary | ICD-10-CM | POA: Diagnosis not present

## 2021-01-18 DIAGNOSIS — E119 Type 2 diabetes mellitus without complications: Secondary | ICD-10-CM | POA: Diagnosis not present

## 2021-01-18 DIAGNOSIS — J351 Hypertrophy of tonsils: Secondary | ICD-10-CM | POA: Diagnosis not present

## 2021-01-18 DIAGNOSIS — J029 Acute pharyngitis, unspecified: Secondary | ICD-10-CM

## 2021-01-18 DIAGNOSIS — J45909 Unspecified asthma, uncomplicated: Secondary | ICD-10-CM | POA: Diagnosis not present

## 2021-01-18 DIAGNOSIS — R1310 Dysphagia, unspecified: Secondary | ICD-10-CM | POA: Diagnosis not present

## 2021-01-18 LAB — COMPREHENSIVE METABOLIC PANEL
ALT: 28 U/L (ref 0–44)
AST: 15 U/L (ref 15–41)
Albumin: 4 g/dL (ref 3.5–5.0)
Alkaline Phosphatase: 68 U/L (ref 38–126)
Anion gap: 9 (ref 5–15)
BUN: 10 mg/dL (ref 6–20)
CO2: 27 mmol/L (ref 22–32)
Calcium: 8.9 mg/dL (ref 8.9–10.3)
Chloride: 103 mmol/L (ref 98–111)
Creatinine, Ser: 0.94 mg/dL (ref 0.61–1.24)
GFR, Estimated: >60 mL/min (ref >60)
Glucose, Bld: 168 mg/dL — ABNORMAL HIGH (ref 70–99)
Potassium: 3.7 mmol/L (ref 3.5–5.1)
Sodium: 139 mmol/L (ref 135–145)
Total Bilirubin: 0.6 mg/dL (ref 0.3–1.2)
Total Protein: 8 g/dL (ref 6.5–8.1)

## 2021-01-18 LAB — CBC WITH DIFFERENTIAL/PLATELET
Abs Immature Granulocytes: 0.06 10*3/uL (ref 0.00–0.07)
Basophils Absolute: 0 10*3/uL (ref 0.0–0.1)
Basophils Relative: 0 %
Eosinophils Absolute: 0 10*3/uL (ref 0.0–0.5)
Eosinophils Relative: 0 %
HCT: 45.8 % (ref 39.0–52.0)
Hemoglobin: 15.3 g/dL (ref 13.0–17.0)
Immature Granulocytes: 1 %
Lymphocytes Relative: 11 %
Lymphs Abs: 1.4 10*3/uL (ref 0.7–4.0)
MCH: 29.9 pg (ref 26.0–34.0)
MCHC: 33.4 g/dL (ref 30.0–36.0)
MCV: 89.6 fL (ref 80.0–100.0)
Monocytes Absolute: 1.1 10*3/uL — ABNORMAL HIGH (ref 0.1–1.0)
Monocytes Relative: 9 %
Neutro Abs: 9.8 10*3/uL — ABNORMAL HIGH (ref 1.7–7.7)
Neutrophils Relative %: 79 %
Platelets: 182 10*3/uL (ref 150–400)
RBC: 5.11 MIL/uL (ref 4.22–5.81)
RDW: 13.5 % (ref 11.5–15.5)
WBC: 12.4 10*3/uL — ABNORMAL HIGH (ref 4.0–10.5)
nRBC: 0 % (ref 0.0–0.2)

## 2021-01-18 LAB — RESP PANEL BY RT-PCR (FLU A&B, COVID) ARPGX2
Influenza A by PCR: NEGATIVE
Influenza B by PCR: NEGATIVE
SARS Coronavirus 2 by RT PCR: NEGATIVE

## 2021-01-18 LAB — GROUP A STREP BY PCR: Group A Strep by PCR: NOT DETECTED

## 2021-01-18 LAB — MONONUCLEOSIS SCREEN: Mono Screen: NEGATIVE

## 2021-01-18 MED ORDER — CEFDINIR 125 MG/5ML PO SUSR: 300.0000 mg | Freq: Two times a day (BID) | ORAL | 0 refills | Status: DC

## 2021-01-18 MED ORDER — MORPHINE SULFATE (PF) 2 MG/ML IV SOLN
2.0000 mg | Freq: Once | INTRAVENOUS | Status: AC
  Administered 2021-01-18: 14:00:00 2 mg via INTRAVENOUS
  Filled 2021-01-18: qty 1

## 2021-01-18 MED ORDER — LIDOCAINE VISCOUS HCL 2 % MT SOLN: OROMUCOSAL | 0 refills | Status: DC

## 2021-01-18 MED ORDER — IOHEXOL 300 MG/ML  SOLN
100.0000 mL | Freq: Once | INTRAMUSCULAR | Status: AC | PRN
  Administered 2021-01-18: 13:00:00 75 mL via INTRAVENOUS

## 2021-01-18 MED ORDER — SODIUM CHLORIDE 0.9 % IV BOLUS
1000.0000 mL | Freq: Once | INTRAVENOUS | Status: AC
  Administered 2021-01-18: 11:00:00 1000 mL via INTRAVENOUS

## 2021-01-18 MED ORDER — DEXAMETHASONE SODIUM PHOSPHATE 10 MG/ML IJ SOLN
10.0000 mg | Freq: Once | INTRAMUSCULAR | Status: AC
  Administered 2021-01-18: 11:00:00 10 mg via INTRAVENOUS
  Filled 2021-01-18: qty 1

## 2021-01-18 MED ORDER — MORPHINE SULFATE (PF) 2 MG/ML IV SOLN
2.0000 mg | Freq: Once | INTRAVENOUS | Status: AC
  Administered 2021-01-18: 11:00:00 2 mg via INTRAVENOUS
  Filled 2021-01-18: qty 1

## 2021-01-18 MED ORDER — ACETAMINOPHEN 160 MG/5ML PO SOLN
650.0000 mg | Freq: Once | ORAL | Status: AC
  Administered 2021-01-18: 11:00:00 650 mg via ORAL
  Filled 2021-01-18: qty 20.3

## 2021-01-18 MED ORDER — CLINDAMYCIN HCL 150 MG PO CAPS: 150.0000 mg | ORAL_CAPSULE | Freq: Three times a day (TID) | ORAL | 0 refills | Status: DC

## 2021-01-18 MED ORDER — PREDNISOLONE 15 MG/5ML PO SOLN: 30.0000 mg | Freq: Two times a day (BID) | ORAL | 0 refills | Status: DC

## 2021-01-18 MED ORDER — CLINDAMYCIN PHOSPHATE 600 MG/50ML IV SOLN
600.0000 mg | Freq: Once | INTRAVENOUS | Status: AC
  Administered 2021-01-18: 14:00:00 600 mg via INTRAVENOUS
  Filled 2021-01-18 (×2): qty 50

## 2021-01-18 MED ORDER — NYSTATIN 100000 UNIT/ML MT SUSP: 500000.0000 [IU] | Freq: Four times a day (QID) | OROMUCOSAL | 0 refills | Status: DC

## 2021-01-18 NOTE — Patient Instructions (Signed)
Anthony Russell, thank you for joining Margaretann Loveless, PA-C for today's virtual visit.  While this provider is not your primary care provider (PCP), if your PCP is located in our provider database this encounter information will be shared with them immediately following your visit.  Consent: (Patient) Anthony Russell provided verbal consent for this virtual visit at the beginning of the encounter.  Current Medications:  Current Outpatient Medications:    cefdinir (OMNICEF) 125 MG/5ML suspension, Take 12 mLs (300 mg total) by mouth 2 (two) times daily., Disp: 240 mL, Rfl: 0   lidocaine (XYLOCAINE) 2 % solution, 5 mL swish and swallow every 4 hours as needed for throat pain, Disp: 100 mL, Rfl: 0   prednisoLONE (PRELONE) 15 MG/5ML SOLN, Take 10 mLs (30 mg total) by mouth 2 (two) times daily., Disp: 200 mL, Rfl: 0   albuterol (VENTOLIN HFA) 108 (90 Base) MCG/ACT inhaler, Inhale 2 puffs into the lungs every 6 (six) hours as needed for wheezing or shortness of breath., Disp: 8 g, Rfl: 0   atorvastatin (LIPITOR) 10 MG tablet, Take 1 tablet (10 mg total) by mouth daily., Disp: 30 tablet, Rfl: 11   calcium carbonate (OS-CAL) 1250 (500 Ca) MG chewable tablet, Chew 2 tablets by mouth daily., Disp: , Rfl:    dapagliflozin propanediol (FARXIGA) 5 MG TABS tablet, Take 1 tablet (5 mg total) by mouth daily., Disp: 30 tablet, Rfl: 3   famotidine (PEPCID) 20 MG tablet, Take 1 tablet (20 mg total) by mouth at bedtime., Disp: 30 tablet, Rfl: 3   fluticasone (FLOVENT HFA) 110 MCG/ACT inhaler, Inhale 2 puffs into the lungs 2 (two) times daily., Disp: 1 each, Rfl: 0   glycopyrrolate (ROBINUL) 2 MG tablet, Take 1 tablet (2 mg total) by mouth 2 (two) times daily as needed (Abdominal disccomfort)., Disp: 60 tablet, Rfl: 2   indomethacin (INDOCIN) 25 MG capsule, Take 1 capsule (25 mg total) by mouth 3 (three) times daily with meals. For five days. (Patient not taking: Reported on 12/16/2020), Disp: 15 capsule, Rfl: 2    L-Arginine 500 MG CAPS, Take 2 capsules by mouth 2 (two) times daily. (Patient not taking: Reported on 12/16/2020), Disp: , Rfl:    levocetirizine (XYZAL) 5 MG tablet, Take 1 tablet (5 mg total) by mouth every evening., Disp: 30 tablet, Rfl: 3   metFORMIN (GLUCOPHAGE) 1000 MG tablet, TAKE 1 TABLET(1000 MG) BY MOUTH TWICE DAILY WITH A MEAL, Disp: 180 tablet, Rfl: 1   Multiple Vitamin (MULTIVITAMIN ADULT PO), Take 2 tablets by mouth daily., Disp: , Rfl:    Nutritional Supplements (PYCNOGENOL) 300-30 MG CAPS, Take 1 capsule by mouth 3 (three) times daily., Disp: , Rfl:    pantoprazole (PROTONIX) 40 MG tablet, Take 1 tablet (40 mg total) by mouth 2 (two) times daily before a meal., Disp: 60 tablet, Rfl: 6   propranolol (INDERAL) 20 MG tablet, Take 1 tablet (20 mg total) by mouth 2 (two) times daily., Disp: 60 tablet, Rfl: 3   Medications ordered in this encounter:  Meds ordered this encounter  Medications   lidocaine (XYLOCAINE) 2 % solution    Sig: 5 mL swish and swallow every 4 hours as needed for throat pain    Dispense:  100 mL    Refill:  0    Order Specific Question:   Supervising Provider    Answer:   Hyacinth Meeker, BRIAN [[id number]]   prednisoLONE (PRELONE) 15 MG/5ML SOLN    Sig: Take 10 mLs (30 mg total)  by mouth 2 (two) times daily.    Dispense:  200 mL    Refill:  0    Order Specific Question:   Supervising Provider    Answer:   MILLER, BRIAN [[id number]]   cefdinir (OMNICEF) 125 MG/5ML suspension    Sig: Take 12 mLs (300 mg total) by mouth 2 (two) times daily.    Dispense:  240 mL    Refill:  0    Order Specific Question:   Supervising Provider    Answer:   Hyacinth Meeker, BRIAN [[id number]]     *If you need refills on other medications prior to your next appointment, please contact your pharmacy*  Follow-Up: Call back or seek an in-person evaluation if the symptoms worsen or if the condition fails to improve as anticipated.  Other Instructions Strep Throat, Adult Strep throat is an infection in the  throat that is caused by bacteria. It is common during the cold months of the year. It mostly affects children who are 40-9 years old. However, people of all ages can get it at any time of the year. This infection spreads from person to person (is contagious) through coughing, sneezing, or having close contact. Your health care provider may use other names to describe the infection. When strep throat affects the tonsils, it is called tonsillitis. When it affects the back of the throat, it is called pharyngitis. What are the causes? This condition is caused by the Streptococcus pyogenes bacteria. What increases the risk? You are more likely to develop this condition if: You care for school-age children, or are around school-age children. Children are more likely to get strep throat and may spread it to others. You spend time in crowded places where the infection can spread easily. You have close contact with someone who has strep throat. What are the signs or symptoms? Symptoms of this condition include: Fever or chills. Redness, swelling, or pain in the tonsils or throat. Pain or difficulty when swallowing. White or yellow spots on the tonsils or throat. Tender glands in the neck and under the jaw. Bad smelling breath. Red rash all over the body. This is rare. How is this diagnosed? This condition is diagnosed by tests that check for the presence and the amount of bacteria that cause strep throat. They are: Rapid strep test. Your throat is swabbed and checked for the presence of bacteria. Results are usually ready in minutes. Throat culture test. Your throat is swabbed. The sample is placed in a cup that allows infections to grow. Results are usually ready in 1 or 2 days. How is this treated? This condition may be treated with: Medicines that kill germs (antibiotics). Medicines that relieve pain or fever. These include: Ibuprofen or acetaminophen. Aspirin, only for people who are over the  age of 40. Throat lozenges. Throat sprays. Follow these instructions at home: Medicines  Take over-the-counter and prescription medicines only as told by your health care provider. Take your antibiotic medicine as told by your health care provider. Do not stop taking the antibiotic even if you start to feel better. Eating and drinking  If you have trouble swallowing, try eating soft foods until your sore throat feels better. Drink enough fluid to keep your urine pale yellow. To help relieve pain, you may have: Warm fluids, such as soup and tea. Cold fluids, such as frozen desserts or popsicles. General instructions Gargle with a salt-water mixture 3-4 times a day or as needed. To make a salt-water mixture, completely dissolve -1  tsp (3-6 g) of salt in 1 cup (237 mL) of warm water. Get plenty of rest. Stay home from work or school until you have been taking antibiotics for 24 hours. Do not use any products that contain nicotine or tobacco. These products include cigarettes, chewing tobacco, and vaping devices, such as e-cigarettes. If you need help quitting, ask your health care provider. It is up to you to get your test results. Ask your health care provider, or the department that is doing the test, when your results will be ready. Keep all follow-up visits. This is important. How is this prevented?  Do not share food, drinking cups, or personal items that could cause the infection to spread to other people. Wash your hands often with soap and water for at least 20 seconds. If soap and water are not available, use hand sanitizer. Make sure that all people in your house wash their hands well. Have family members tested if they have a sore throat or fever. They may need an antibiotic if they have strep throat. Contact a health care provider if: You have swelling in your neck that keeps getting bigger. You develop a rash, cough, or earache. You cough up a thick mucus that is green,  yellow-brown, or bloody. You have pain or discomfort that does not get better with medicine. Your symptoms seem to be getting worse. You have a fever. Get help right away if: You have new symptoms, such as vomiting, severe headache, stiff or painful neck, chest pain, or shortness of breath. You have severe throat pain, drooling, or changes in your voice. You have swelling of the neck, or the skin on the neck becomes red and tender. You have signs of dehydration, such as tiredness (fatigue), dry mouth, and decreased urination. You become increasingly sleepy, or you cannot wake up completely. Your joints become red or painful. These symptoms may represent a serious problem that is an emergency. Do not wait to see if the symptoms will go away. Get medical help right away. Call your local emergency services (911 in the [place] Do not drive yourself to the hospital. Summary Strep throat is an infection in the throat that is caused by the Streptococcus pyogenes bacteria. This infection is spread from person to person (is contagious) through coughing, sneezing, or having close contact. Take your medicines, including antibiotics, as told by your health care provider. Do not stop taking the antibiotic even if you start to feel better. To prevent the spread of germs, wash your hands well with soap and water. Have others do the same. Do not share food, drinking cups, or personal items. Get help right away if you have new symptoms, such as vomiting, severe headache, stiff or painful neck, chest pain, or shortness of breath. This information is not intended to replace advice given to you by your health care provider. Make sure you discuss any questions you have with your health care provider. Document Revised: 05/11/2020 Document Reviewed: 05/11/2020 Elsevier Patient Education  2022 [employer].    If you have been instructed to have an in-person evaluation today at a local Urgent Care facility, please  use the link below. It will take you to a list of all of our available [facility], including address, phone number and hours of operation. Please do not delay care.  [facility]  If you or a family member do not have a primary care provider, use the link below to schedule a visit and  establish care. When you choose a [facility] primary care physician or advanced practice provider, you gain a long-term partner in health. Find a Primary Care Provider  Learn more about [facility]'s in-office and virtual care options: [facility] - Get Care Now

## 2021-01-18 NOTE — ED Provider Notes (Signed)
[facility] EMERGENCY DEPARTMENT Provider Note   CSN: [id number] Arrival date & time: 01/18/21  1012     History Chief Complaint  Patient presents with   Sore Throat    Anthony Russell is a 40 y.o. male with past medical history significant for acid reflux, diabetes who presents with worsening sore throat since last Thursday.  Patient reports that he was seen at virtual urgent care, diagnosed with viral upper respiratory infection, and has been using over-the-counter treatment with minimal improvement.  Patient reports that he woke up this morning with significantly worsening throat pain, noticed some white lesions in the back of his throat, and has been having difficulty tolerating his saliva secondary to pain with swallowing.  Patient endorses some fever, chills.  Patient denies nausea, vomiting, diarrhea.  Patient denies headache.  Patient does endorse some dry cough.  Patient denies recent sick contacts.   Sore Throat      Past Medical History:  Diagnosis Date   Allergy    Allergy, unspecified not elsewhere classified    rx w/ OTC antihistamines PRN   Anxiety    Asthma    Atypical chest pain    recent neg eval w/ normal cxr/ekg   Depression    DM (diabetes mellitus) (HCC)    Dyspepsia    on protonix 40mg /d   GERD (gastroesophageal reflux disease)    on protonix 40mg /d   HTN (hypertension)    Hyperlipidemia    on diet alone   IBS (irritable bowel syndrome)    Lumbar back pain    s/p lumbar laminectomy 2007 by DrCabbell   Migraines    Overweight(278.02)    weight Jan10=246#. .he was 225# in 9/07...diet and exercise was discussed   Sleep apnea     Patient Active Problem List   Diagnosis Date Noted   Subacromial bursitis of right shoulder joint 05/17/2020   Hypersomnia 11/03/2015   Obesity 11/03/2015   Acute bacterial bronchitis 01/18/2015   Allergic rhinitis 04/15/2014   Asthma in adult 04/15/2014   GERD (gastroesophageal reflux disease) 04/15/2014    IBS (irritable bowel syndrome) 04/01/2013   Nausea alone 01/15/2013   Diarrhea 01/15/2013   Abdominal cramping 01/15/2013   Depression 10/15/2012   Lesion of eyebrow 02/09/2012   Reactive airways dysfunction syndrome (HCC) 07/05/2011   Gastroparesis 07/05/2011   Anxiety 07/05/2010   TESTOSTERONE DEFICIENCY 05/26/2008   FATIGUE 05/25/2008   ABDOMINAL BLOATING 04/03/2008   HYPERLIPIDEMIA 02/06/2008   OVERWEIGHT 02/06/2008   Facet arthropathy, lumbar 02/06/2008   CHEST PAIN, ATYPICAL 02/06/2008   GERD 11/19/2007   ALLERGY 11/18/2007    Past Surgical History:  Procedure Laterality Date   DENTAL SURGERY     LUMBAR LAMINECTOMY  01/30/2005   DrCabbell   TEAR DUCT PROBING         Family History  Problem Relation Age of Onset   Allergies Mother    Irritable bowel syndrome Mother    Diabetes Mother    Allergies Father    Diabetes Father    Hyperlipidemia Father    Irritable bowel syndrome Brother    Heart disease Brother    Allergies Brother    Breast cancer Maternal Aunt    Colon cancer Neg Hx     Social History   Tobacco Use   Smoking status: Former    Packs/day: 0.10    Years: 8.00    Pack years: 0.80    Types: Cigarettes    Quit date: 03/03/2011    Years  since quitting: 9.8   Smokeless tobacco: Never  Vaping Use   Vaping Use: Never used  Substance Use Topics   Alcohol use: Yes    Comment: once or twice a month   Drug use: No    Home Medications Prior to Admission medications   Medication Sig Start Date End Date Taking? Authorizing Provider  clindamycin (CLEOCIN) 150 MG capsule Take 1 capsule (150 mg total) by mouth 3 (three) times daily. 01/18/21  Yes Almer Littleton H, PA-C  nystatin (MYCOSTATIN) 100000 UNIT/ML suspension Take 5 mLs (500,000 Units total) by mouth 4 (four) times daily. 01/18/21  Yes Juliany Daughety H, PA-C  albuterol (VENTOLIN HFA) 108 (90 Base) MCG/ACT inhaler Inhale 2 puffs into the lungs every 6 (six) hours as needed for  wheezing or shortness of breath. 08/11/20   Junie Spencer, FNP  atorvastatin (LIPITOR) 10 MG tablet Take 1 tablet (10 mg total) by mouth daily. 08/16/19   Saguier, Ramon Dredge, PA-C  calcium carbonate (OS-CAL) 1250 (500 Ca) MG chewable tablet Chew 2 tablets by mouth daily.    [provider]  cefdinir (OMNICEF) 125 MG/5ML suspension Take 12 mLs (300 mg total) by mouth 2 (two) times daily. 01/18/21   Margaretann Loveless, PA-C  dapagliflozin propanediol (FARXIGA) 5 MG TABS tablet Take 1 tablet (5 mg total) by mouth daily. 11/17/20   Saguier, Ramon Dredge, PA-C  famotidine (PEPCID) 20 MG tablet Take 1 tablet (20 mg total) by mouth at bedtime. 12/16/20   Meredith Pel, NP  fluticasone (FLOVENT HFA) 110 MCG/ACT inhaler Inhale 2 puffs into the lungs 2 (two) times daily. 08/23/20   Sharlene Dory, DO  glycopyrrolate (ROBINUL) 2 MG tablet Take 1 tablet (2 mg total) by mouth 2 (two) times daily as needed (Abdominal disccomfort). 12/16/20   Meredith Pel, NP  indomethacin (INDOCIN) 25 MG capsule Take 1 capsule (25 mg total) by mouth 3 (three) times daily with meals. For five days. Patient not taking: Reported on 12/16/2020 10/19/20   Ocie Doyne, MD  L-Arginine 500 MG CAPS Take 2 capsules by mouth 2 (two) times daily. Patient not taking: Reported on 12/16/2020 08/30/20   [provider]  levocetirizine (XYZAL) 5 MG tablet Take 1 tablet (5 mg total) by mouth every evening. 11/28/18   Saguier, Ramon Dredge, PA-C  lidocaine (XYLOCAINE) 2 % solution 5 mL swish and swallow every 4 hours as needed for throat pain 01/18/21   Joycelyn Man M, PA-C  metFORMIN (GLUCOPHAGE) 1000 MG tablet TAKE 1 TABLET(1000 MG) BY MOUTH TWICE DAILY WITH A MEAL 12/22/20   Saguier, Ramon Dredge, PA-C  Multiple Vitamin (MULTIVITAMIN ADULT PO) Take 2 tablets by mouth daily.    [provider]  Nutritional Supplements (PYCNOGENOL) 300-30 MG CAPS Take 1 capsule by mouth 3 (three) times daily. 08/30/20   [provider]  pantoprazole (PROTONIX) 40 MG tablet Take 1 tablet (40 mg total) by mouth 2 (two) times daily before a meal. 12/16/20   Meredith Pel, NP  prednisoLONE (PRELONE) 15 MG/5ML SOLN Take 10 mLs (30 mg total) by mouth 2 (two) times daily. 01/18/21   Margaretann Loveless, PA-C  propranolol (INDERAL) 20 MG tablet Take 1 tablet (20 mg total) by mouth 2 (two) times daily. 10/25/20   Ocie Doyne, MD    Allergies    Patient has no known allergies.  Review of Systems   Review of Systems  Constitutional:  Positive for fever.  HENT:  Positive for sore throat.   All other  systems reviewed and are negative.  Physical Exam Updated Vital Signs BP (!) 150/88 (BP Location: Left Arm)    Pulse (!) 109    Temp 98.8 F (37.1 C) (Oral)    Resp 19    Ht 5\' 10"  (1.778 m)    Wt 107 kg    SpO2 96%    BMI 33.86 kg/m   Physical Exam Vitals and nursing note reviewed.  Constitutional:      General: He is not in acute distress.    Appearance: Normal appearance. He is ill-appearing.  HENT:     Head: Normocephalic and atraumatic.     Mouth/Throat:     Comments: Posterior oropharynx erythematous, he has 1+ tonsils bilaterally with white exudate.  There is no evidence of peritonsillar abscess noted.  Uvula is midline, no deviation noted.  Patient does have intact swallow reflex with pain.  He is currently tolerating his own saliva. Eyes:     General:        Right eye: No discharge.        Left eye: No discharge.  Cardiovascular:     Rate and Rhythm: Normal rate and regular rhythm.  Pulmonary:     Effort: Pulmonary effort is normal. No respiratory distress.  Musculoskeletal:        General: No deformity.  Skin:    General: Skin is warm and dry.  Neurological:     Mental Status: He is alert and oriented to person, place, and time.  Psychiatric:        Mood and Affect: Mood normal.        Behavior: Behavior normal.    ED Results / Procedures / Treatments   Labs (all labs ordered are  listed, but only abnormal results are displayed) Labs Reviewed  CBC WITH DIFFERENTIAL/PLATELET - Abnormal; Notable for the following components:      Result Value   WBC 12.4 (*)    Neutro Abs 9.8 (*)    Monocytes Absolute 1.1 (*)    All other components within normal limits  COMPREHENSIVE METABOLIC PANEL - Abnormal; Notable for the following components:   Glucose, Bld 168 (*)    All other components within normal limits  GROUP A STREP BY PCR  RESP PANEL BY RT-PCR (FLU A&B, COVID) [id number]  MONONUCLEOSIS SCREEN  URINALYSIS, ROUTINE W REFLEX MICROSCOPIC    EKG EKG Interpretation  Date/Time:  Tuesday January 18 2021 10:34:06 EST Ventricular Rate:  122 PR Interval:  156 QRS Duration: 102 QT Interval:  327 QTC Calculation: 466 R Axis:   103 Text Interpretation: Sinus tachycardia Right axis deviation Borderline T wave abnormalities Similar to Feb 2020 tracing Confirmed by Mar 2020 [phone]) on 01/18/2021 10:37:27 AM  Radiology CT Soft Tissue Neck W Contrast  Result Date: 01/18/2021 CLINICAL DATA:  Epiglottitis or tonsillitis suspected. Difficulty swallowing. Sore throat. EXAM: CT NECK WITH CONTRAST TECHNIQUE: Multidetector CT imaging of the neck was performed using the standard protocol following the bolus administration of intravenous contrast. CONTRAST:  26mL OMNIPAQUE IOHEXOL 300 MG/ML  SOLN COMPARISON:  CT angio head and neck 09/08/2020 FINDINGS: Pharynx and larynx: Asymmetric enlargement left palatine tonsil. This shows homogeneous enhancement and measures 2.4 cm in diameter. This is prominent on the prior study as well. No peritonsillar abscess identified. Epiglottitis is mildly edematous. Vocal cords normal. Airway intact. Salivary glands: No inflammation, mass, or stone. Thyroid: Negative Lymph nodes: Posterior lymph nodes on the right measuring up to 7 mm. Left level 2 lymph node 10 mm.  Posterior lymph nodes on the left measuring up to 8 mm. Vascular: Normal vascular enhancement.  Limited intracranial: Negative Visualized orbits: Negative Mastoids and visualized paranasal sinuses: Moderate mucosal edema in the sphenoid sinus. Remaining sinuses clear. Skeleton: Ossification posterior longitudinal ligament at C3-C5 causing mild spinal stenosis. No acute skeletal abnormality. Upper chest: Lung apices clear bilaterally. Other: None IMPRESSION: 1. Mild edema in the epiglottis most compatible with epiglottitis. Airway intact. 2. Asymmetric enlargement of the left palatine tonsil similar to the prior CTA 09/08/2020. This could represent asymmetric inflammation however neoplasm is possible. Direct mucosal evaluation recommended. Negative for peritonsillar abscess. Electronically Signed   By: Marlan Palau M.D.   On: 01/18/2021 13:15    Procedures Procedures   Medications Ordered in ED Medications  sodium chloride 0.9 % bolus 1,000 mL (0 mLs Intravenous Stopped 01/18/21 1218)  dexamethasone (DECADRON) injection 10 mg (10 mg Intravenous Given 01/18/21 1117)  acetaminophen (TYLENOL) 160 MG/5ML solution 650 mg (650 mg Oral Given 01/18/21 1118)  morphine 2 MG/ML injection 2 mg (2 mg Intravenous Given 01/18/21 1115)  iohexol (OMNIPAQUE) 300 MG/ML solution 100 mL (75 mLs Intravenous Contrast Given 01/18/21 1252)  morphine 2 MG/ML injection 2 mg (2 mg Intravenous Given 01/18/21 1345)  clindamycin (CLEOCIN) IVPB 600 mg (0 mg Intravenous Stopped 01/18/21 1437)    ED Course  I have reviewed the triage vital signs and the nursing notes.  Pertinent labs & imaging results that were available during my care of the patient were reviewed by me and considered in my medical decision making (see chart for details).  Clinical Course as of 01/18/21 1515  Tue Jan 18, 2021  1355 Dr. Marene Lenz recommends decadron, abx, and follow up in her clinic [CP]    Clinical Course User Index [CP] Montel Clock, Edyth Gunnels   MDM Rules/Calculators/A&P                         I discussed this case with my  attending physician who cosigned this note including patient's presenting symptoms, physical exam, and planned diagnostics and interventions. Attending physician stated agreement with plan or made changes to plan which were implemented.   Attending physician assessed patient at bedside.  Clinically ill-appearing male with what appears to be poorly controlled tonsillitis versus strep pharyngitis.  Given white exudate in the back of the throat high clinical suspicion for strep pharyngitis.  Patient is diabetic, however slightly less concern for oropharyngeal candidiasis, patient reports that his sugar has been well controlled recently, and I would not expect such a high fever, tachycardia with candidiasis.  Additionally patient is not immunocompromise.  Do not see clinical evidence of peritonsillar abscess, uvular deviation, or frank trismus at this time, however patient is extremely uncomfortable, and having difficulty swallowing due to pain.  We will control patient's pain, run basic lab work, as well as administer fluids and steroids at this time.  On reevaluation patient appears somewhat more comfortable.  Patient's lab work is significant for mild elevation of white count with neutrophil predominance, CMP is unremarkable other than slightly elevated blood glucose of 168, in context of known diabetes.  Given significance of patient's inability to open mouth, and pain in the back of throat, concern for swelling we will obtain CT soft tissue neck.  CT soft tissue neck reports epiglottitis, as well as some unilateral swelling around one of the tonsils but does not show evidence of peritonsillar abscess, but they did recommend further evaluation by ENT.  Patient does endorse history of tobacco smoking that he quit in 2014, so some concern that this may represent an early malignancy, and ENT recommended further evaluation in their office.  Spoke with Dr. Marene Lenz with ENT about CT results, and recommendations  for treatment.  She recommends 10 mg intramuscular Decadron which we have already administered, dose of antibiotics, and monitoring for a few hours to ensure that patient maintains patent airway while he is being monitored.  On reevaluation patient continues to maintain patent airway, his vital signs are improved, temperature of 98.8, pulse rate remains somewhat tachycardic but markedly improved from 135 on arrival.  Patient has received 1 dose of clindamycin, 1 L fluid bolus, as well as his pain is controlled at this time.  Given recommendations we will recommend the patient follow-up closely with ENT, discharged with prescription for clindamycin, patient already has a prescription for viscous lidocaine from his urgent care, and we will also give him a prescription of oral nystatin for presumed oropharyngeal candidiasis in the context of white exudate on the back of throat with negative strep swab.  Very strict return precautions given, as patient will need further evaluation if he begins to develop worsening difficulty swallowing, breathing.  Patient understands and agrees to this plan, discharged in stable condition at this time.  Final Clinical Impression(s) / ED Diagnoses Final diagnoses:  Epiglottitis  Acute pharyngitis, unspecified etiology    Rx / DC Orders ED Discharge Orders          Ordered    nystatin (MYCOSTATIN) 100000 UNIT/ML suspension  4 times daily        01/18/21 1506    clindamycin (CLEOCIN) 150 MG capsule  3 times daily        01/18/21 [address] [place], PA-C 01/18/21 1516    Maia Plan, MD 01/19/21 [phone]

## 2021-01-18 NOTE — Telephone Encounter (Signed)
Noted  

## 2021-01-18 NOTE — ED Notes (Signed)
ED Provider at bedside. 

## 2021-01-18 NOTE — Progress Notes (Signed)
Virtual Visit Consent   Anthony Russell, you are scheduled for a virtual visit with a [facility] provider today.     Just as with appointments in the office, your consent must be obtained to participate.  Your consent will be active for this visit and any virtual visit you may have with one of our providers in the next 365 days.     If you have a MyChart account, a copy of this consent can be sent to you electronically.  All virtual visits are billed to your insurance company just like a traditional visit in the office.    As this is a virtual visit, video technology does not allow for your provider to perform a traditional examination.  This may limit your provider's ability to fully assess your condition.  If your provider identifies any concerns that need to be evaluated in person or the need to arrange testing (such as labs, EKG, etc.), we will make arrangements to do so.     Although advances in technology are sophisticated, we cannot ensure that it will always work on either your end or our end.  If the connection with a video visit is poor, the visit may have to be switched to a telephone visit.  With either a video or telephone visit, we are not always able to ensure that we have a secure connection.     I need to obtain your verbal consent now.   Are you willing to proceed with your visit today?    [name] has provided verbal consent on 01/18/2021 for a virtual visit (video or telephone).   Margaretann Loveless, PA-C   Date: 01/18/2021 9:30 AM   Virtual Visit via Video Note   I, Margaretann Loveless, connected with  Anthony Russell  ([id number], 26-Mar-1980) on 01/18/21 at  9:30 AM EST by a video-enabled telemedicine application and verified that I am speaking with the correct person using two identifiers.  Location: Patient: Virtual Visit Location Patient: Home Provider: Virtual Visit Location Provider: Home Office   I discussed the limitations of evaluation and management by  telemedicine and the availability of in person appointments. The patient expressed understanding and agreed to proceed.    History of Present Illness: Anthony Russell is a 40 y.o. who identifies as a male who was assigned male at birth, and is being seen today for sore throat.  HPI: Sore Throat  This is a new problem. The current episode started 1 to 4 weeks ago (Thursday last week). The problem has been gradually worsening. The maximum temperature recorded prior to his arrival was 100.4 - 100.9 F. The fever has been present for Less than 1 day. The pain is severe. Associated symptoms include congestion, coughing, headaches, a hoarse voice, swollen glands (tender to touch bilaterally) and trouble swallowing. Pertinent negatives include no drooling, ear discharge, ear pain, plugged ear sensation, shortness of breath or vomiting. He has tried acetaminophen (Mucinex, dayquil, and tylenol, mucinex sinus max, chloraseptic spray) for the symptoms. The treatment provided no relief.     Problems:  Patient Active Problem List   Diagnosis Date Noted   Subacromial bursitis of right shoulder joint 05/17/2020   Hypersomnia 11/03/2015   Obesity 11/03/2015   Acute bacterial bronchitis 01/18/2015   Allergic rhinitis 04/15/2014   Asthma in adult 04/15/2014   GERD (gastroesophageal reflux disease) 04/15/2014   IBS (irritable bowel syndrome) 04/01/2013   Nausea alone 01/15/2013   Diarrhea 01/15/2013   Abdominal  cramping 01/15/2013   Depression 10/15/2012   Lesion of eyebrow 02/09/2012   Reactive airways dysfunction syndrome (HCC) 07/05/2011   Gastroparesis 07/05/2011   Anxiety 07/05/2010   TESTOSTERONE DEFICIENCY 05/26/2008   FATIGUE 05/25/2008   ABDOMINAL BLOATING 04/03/2008   HYPERLIPIDEMIA 02/06/2008   OVERWEIGHT 02/06/2008   Facet arthropathy, lumbar 02/06/2008   CHEST PAIN, ATYPICAL 02/06/2008   GERD 11/19/2007   ALLERGY 11/18/2007    Allergies: No Known Allergies Medications:  Current  Outpatient Medications:    cefdinir (OMNICEF) 125 MG/5ML suspension, Take 12 mLs (300 mg total) by mouth 2 (two) times daily., Disp: 240 mL, Rfl: 0   lidocaine (XYLOCAINE) 2 % solution, 5 mL swish and swallow every 4 hours as needed for throat pain, Disp: 100 mL, Rfl: 0   prednisoLONE (PRELONE) 15 MG/5ML SOLN, Take 10 mLs (30 mg total) by mouth 2 (two) times daily., Disp: 200 mL, Rfl: 0   albuterol (VENTOLIN HFA) 108 (90 Base) MCG/ACT inhaler, Inhale 2 puffs into the lungs every 6 (six) hours as needed for wheezing or shortness of breath., Disp: 8 g, Rfl: 0   atorvastatin (LIPITOR) 10 MG tablet, Take 1 tablet (10 mg total) by mouth daily., Disp: 30 tablet, Rfl: 11   calcium carbonate (OS-CAL) 1250 (500 Ca) MG chewable tablet, Chew 2 tablets by mouth daily., Disp: , Rfl:    dapagliflozin propanediol (FARXIGA) 5 MG TABS tablet, Take 1 tablet (5 mg total) by mouth daily., Disp: 30 tablet, Rfl: 3   famotidine (PEPCID) 20 MG tablet, Take 1 tablet (20 mg total) by mouth at bedtime., Disp: 30 tablet, Rfl: 3   fluticasone (FLOVENT HFA) 110 MCG/ACT inhaler, Inhale 2 puffs into the lungs 2 (two) times daily., Disp: 1 each, Rfl: 0   glycopyrrolate (ROBINUL) 2 MG tablet, Take 1 tablet (2 mg total) by mouth 2 (two) times daily as needed (Abdominal disccomfort)., Disp: 60 tablet, Rfl: 2   indomethacin (INDOCIN) 25 MG capsule, Take 1 capsule (25 mg total) by mouth 3 (three) times daily with meals. For five days. (Patient not taking: Reported on 12/16/2020), Disp: 15 capsule, Rfl: 2   L-Arginine 500 MG CAPS, Take 2 capsules by mouth 2 (two) times daily. (Patient not taking: Reported on 12/16/2020), Disp: , Rfl:    levocetirizine (XYZAL) 5 MG tablet, Take 1 tablet (5 mg total) by mouth every evening., Disp: 30 tablet, Rfl: 3   metFORMIN (GLUCOPHAGE) 1000 MG tablet, TAKE 1 TABLET(1000 MG) BY MOUTH TWICE DAILY WITH A MEAL, Disp: 180 tablet, Rfl: 1   Multiple Vitamin (MULTIVITAMIN ADULT PO), Take 2 tablets by mouth  daily., Disp: , Rfl:    Nutritional Supplements (PYCNOGENOL) 300-30 MG CAPS, Take 1 capsule by mouth 3 (three) times daily., Disp: , Rfl:    pantoprazole (PROTONIX) 40 MG tablet, Take 1 tablet (40 mg total) by mouth 2 (two) times daily before a meal., Disp: 60 tablet, Rfl: 6   propranolol (INDERAL) 20 MG tablet, Take 1 tablet (20 mg total) by mouth 2 (two) times daily., Disp: 60 tablet, Rfl: 3  Observations/Objective: Patient is well-developed, well-nourished in no acute distress.  Resting comfortably at home.  Head is normocephalic, atraumatic.  No labored breathing.  Speech is clear and coherent with logical content.  Patient is alert and oriented at baseline.  Hoarse voice, pain with talking   Assessment and Plan: 1. Strep pharyngitis - lidocaine (XYLOCAINE) 2 % solution; 5 mL swish and swallow every 4 hours as needed for throat pain  Dispense: 100  mL; Refill: 0 - prednisoLONE (PRELONE) 15 MG/5ML SOLN; Take 10 mLs (30 mg total) by mouth 2 (two) times daily.  Dispense: 200 mL; Refill: 0 - cefdinir (OMNICEF) 125 MG/5ML suspension; Take 12 mLs (300 mg total) by mouth 2 (two) times daily.  Dispense: 240 mL; Refill: 0  - Suspect strep - Will treat with Omnicef and Prednisolone - Viscous lidocaine for pain - Salt water gargles - Push fluids - Rest - Discussed strict ER precautions (particularly if he developed SOB or inability to swallow saliva) - Seek in person evaluation if symptoms worsen or fail to improve  Follow Up Instructions: I discussed the assessment and treatment plan with the patient. The patient was provided an opportunity to ask questions and all were answered. The patient agreed with the plan and demonstrated an understanding of the instructions.  A copy of instructions were sent to the patient via MyChart unless otherwise noted below.    The patient was advised to call back or seek an in-person evaluation if the symptoms worsen or if the condition fails to improve as  anticipated.  Time:  I spent 12 minutes with the patient via telehealth technology discussing the above problems/concerns.    Margaretann Loveless, PA-C

## 2021-01-18 NOTE — Telephone Encounter (Signed)
FYI- Pt called to reschedule appt, very sick and cannot talk.

## 2021-01-18 NOTE — ED Triage Notes (Signed)
Pt arrives with c/o sore throat states he was seen at [facility] on Sunday and told he had a viral URI. States he continues to get worse to the point of having trouble swallowing states he had a virtual visit was told he should come to ED. Symptoms started on Thursday.

## 2021-01-18 NOTE — Discharge Instructions (Signed)
As we discussed, after speaking to our ENT specialist we have minimal concern that your airway is going to worsen after administering antibiotics, steroids, and fluids in the office today.  Based on our evaluation we do have some concern that you have oropharyngeal candidiasis, which is a fungal infection in the back of your throat.  I recommend that you use the nystatin solution as prescribed up to 4 times daily.  Additionally I recommend you take the antibiotics that you are prescribed 3 times daily, and follow-up with the ENT doctor's contact information I have attached as soon as you are able.  It is crucial that you return for further evaluation if you begin to have difficulty tolerating your own saliva, or difficulty breathing.  Please return immediately if you begin to have these symptoms.

## 2021-01-18 NOTE — ED Notes (Signed)
Patient transported to CT 

## 2021-01-19 DIAGNOSIS — J040 Acute laryngitis: Secondary | ICD-10-CM | POA: Diagnosis not present

## 2021-01-19 DIAGNOSIS — Z87891 Personal history of nicotine dependence: Secondary | ICD-10-CM | POA: Diagnosis not present

## 2021-01-19 DIAGNOSIS — J3489 Other specified disorders of nose and nasal sinuses: Secondary | ICD-10-CM | POA: Diagnosis not present

## 2021-01-25 ENCOUNTER — Encounter: Payer: Self-pay | Admitting: Internal Medicine

## 2021-01-28 ENCOUNTER — Ambulatory Visit (INDEPENDENT_AMBULATORY_CARE_PROVIDER_SITE_OTHER): Payer: BC Managed Care – PPO | Admitting: Medical

## 2021-01-28 ENCOUNTER — Encounter: Payer: Self-pay | Admitting: Medical

## 2021-01-28 ENCOUNTER — Other Ambulatory Visit: Payer: Self-pay | Admitting: Medical

## 2021-01-28 VITALS — BP 118/80 | HR 97 | Temp 98.4°F | Resp 18 | Ht 70.0 in | Wt 234.2 lb

## 2021-01-28 DIAGNOSIS — K219 Gastro-esophageal reflux disease without esophagitis: Secondary | ICD-10-CM | POA: Diagnosis not present

## 2021-01-28 DIAGNOSIS — J029 Acute pharyngitis, unspecified: Secondary | ICD-10-CM

## 2021-01-28 DIAGNOSIS — E119 Type 2 diabetes mellitus without complications: Secondary | ICD-10-CM | POA: Diagnosis not present

## 2021-01-28 MED ORDER — SUCRALFATE 1 G PO TABS: 1.0000 g | ORAL_TABLET | Freq: Three times a day (TID) | ORAL | 0 refills | Status: DC

## 2021-01-28 NOTE — Patient Instructions (Signed)
Recent description of pharyngitis signs and symptoms with possible epiglottitis diagnosed in the emergency department.  You had finished course of clindamycin, nystatin, lidocaine and they gave you a steroid injection in the emergency department.  You report gradual improvement with 70% decrease in discomfort.  Now having only discomfort in the morning and by afternoon no significant pain described.  I do not think further antibiotics or steroids are indicated presently.  Particularly since ENT work-up was negative.  You had mentioned that you feel like you are having severe reflux with sour and acid the case in your throat region.  Sometimes feeling like acid might be going into your sinuses.  At this point recommend continuing Protonix daily, famotidine twice daily and will add on Carafate.  Recommend following through with the EGD in February.  Would asked that she give me an update on Tuesday regarding as adding Carafate has decreased her symptoms overall.  Diabetes with recent mild sugar levels.  Recommend checking sugars twice daily once fasting in the morning and 1 postmeal.  Continue metformin and Farxiga.  Give me update on blood sugar readings on Tuesday as well.  Follow-up date to be determined based on update on Tuesday.  Or sooner if needed.

## 2021-01-28 NOTE — Progress Notes (Signed)
Subjective:    Patient ID: Anthony Russell, male    DOB: [birth year], 40 y.o.   MRN: [id number]  HPI Pt in for follow up from the ED.  Arrival date & time: 01/18/21  1012  "Anthony Russell is a 40 y.o. male with past medical history significant for acid reflux, diabetes who presents with worsening sore throat since last Thursday.  Patient reports that he was seen at virtual urgent care, diagnosed with viral upper respiratory infection, and has been using over-the-counter treatment with minimal improvement.  Patient reports that he woke up this morning with significantly worsening throat pain, noticed some white lesions in the back of his throat, and has been having difficulty tolerating his saliva secondary to pain with swallowing.  Patient endorses some fever, chills.  Patient denies nausea, vomiting, diarrhea.  Patient denies headache.  Patient does endorse some dry cough.  Patient denies recent sick contacts."   Exam in ED. "Posterior oropharynx erythematous, he has 1+ tonsils bilaterally with white exudate.  There is no evidence of peritonsillar abscess noted.  Uvula is midline, no deviation noted.  Patient does have intact swallow reflex with pain.  He is currently tolerating his own saliva."  "I discussed this case with my attending physician who cosigned this note including patient's presenting symptoms, physical exam, and planned diagnostics and interventions. Attending physician stated agreement with plan or made changes to plan which were implemented.    "Attending physician assessed patient at bedside.   Clinically ill-appearing male with what appears to be poorly controlled tonsillitis versus strep pharyngitis.  Given white exudate in the back of the throat high clinical suspicion for strep pharyngitis.  Patient is diabetic, however slightly less concern for oropharyngeal candidiasis, patient reports that his sugar has been well controlled recently, and I would not expect such a high  fever, tachycardia with candidiasis.  Additionally patient is not immunocompromise.  Do not see clinical evidence of peritonsillar abscess, uvular deviation, or frank trismus at this time, however patient is extremely uncomfortable, and having difficulty swallowing due to pain.  We will control patient's pain, run basic lab work, as well as administer fluids and steroids at this time.  On reevaluation patient appears somewhat more comfortable.   Patient's lab work is significant for mild elevation of white count with neutrophil predominance, CMP is unremarkable other than slightly elevated blood glucose of 168, in context of known diabetes.   Given significance of patient's inability to open mouth, and pain in the back of throat, concern for swelling we will obtain CT soft tissue neck.  CT soft tissue neck reports epiglottitis, as well as some unilateral swelling around one of the tonsils but does not show evidence of peritonsillar abscess, but they did recommend further evaluation by ENT.  Patient does endorse history of tobacco smoking that he quit in 2014, so some concern that this may represent an early malignancy, and ENT recommended further evaluation in their office.  Spoke with Dr. Marene Lenz with ENT about CT results, and recommendations for treatment.  She recommends 10 mg intramuscular Decadron which we have already administered, dose of antibiotics, and monitoring for a few hours to ensure that patient maintains patent airway while he is being monitored.   On reevaluation patient continues to maintain patent airway, his vital signs are improved, temperature of 98.8, pulse rate remains somewhat tachycardic but markedly improved from 135 on arrival.  Patient has received 1 dose of clindamycin, 1 L fluid bolus, as well as  his pain is controlled at this time.  Given recommendations we will recommend the patient follow-up closely with ENT, discharged with prescription for clindamycin, patient already has  a prescription for viscous lidocaine from his urgent care, and we will also give him a prescription of oral nystatin for presumed oropharyngeal candidiasis in the context of white exudate on the back of throat with negative strep swab.   Very strict return precautions given, as patient will need further evaluation if he begins to develop worsening difficulty swallowing, breathing.  Patient understands and agrees to this plan, discharged in stable condition at this time."  End of ED notes.  Then follow up with ENT.  "Anthony Russell is here today with his mother for follow-up of possible epiglottitis. He was seen at [facility] ER yesterday and CT scan of neck indicated some possible slight enlargement of the epiglottis. The CT also noted some tonsillar asymmetry which is been present from a scan back in August. He had seen Dr. Christell Constant in [facility] who did not feel that there was any worrisome characteristics to the tonsillar asymmetry. He went to urgent care earlier this week for sore throat and was told it was probably a viral syndrome. He went to the ER yesterday he has been started on clindamycin and nystatin mouth rinse. He was given a long-acting steroid injection. He is feeling much better today. He notes no difficulties with breathing, eating or drinking."   Exam at ent.  "Oral cavity/Oropharynx: Lips normal, edentulous-upper and lower dentures present, normal oral vestibule. Normal floor of mouth, tongue and oral mucosa, no mucosal lesions, ulcer or mass, normal tongue mobility.  Hard and soft palate normal with normal mobility. One plus tonsils, no erythema or exudate. Base of tongue, retromolar trigone and oral pharynx normal. Normal sensation, mobility and gag."  "Impression & Plans:   1) tonsillar asymmetry 2) laryngitis-improved  Continue and finish course of clindamycin and nystatin Return to clinic if any lingering or persisting symptoms"  Pt tells me he feels about 70% better. Worse in  morning. Pt has minimal st in am and pnd. When he coughs will bring up faint colored mucus.   Pt feels like recently his reflux has been bad recently. He states symptoms flared before he went to ED. He state acid and sour taste in throat. Pt is on protonix, famotadine. He is on famotadine only once a day.  Pt feels like maybe having reflux into his nose. Maybe irritating his tonsils per pt. He has been elevated upward to decrease    Pt had endoscopy coming up in February.   Diabetic pt and lat a1c was 7.5 2 months ago. Pt on metformin and farxiga.     Review of Systems     Objective:   Physical Exam  General- No acute distress. Pleasant patient. Neck- Full range of motion, no jvd Lungs- Clear, even and unlabored. Heart- regular rate and rhythm. Neurologic- CNII- XII grossly intact.  HEENT-no frontal or maxillary sinus pressure to palpation.  Throat 1+ symmetric tonsils bilaterally.  No exudate.  Uvula midline.  No lymphadenopathy of the neck.  Abdomen-soft, nontender, nondistended, positive bowel sounds, no rebound or guarding Back-no CVA tenderness      Assessment & Plan:   Patient Instructions  Recent description of pharyngitis signs and symptoms with possible epiglottitis diagnosed in the emergency department.  You had finished course of clindamycin, nystatin, lidocaine and they gave you a steroid injection in the emergency department.  You report gradual  improvement with 70% decrease in discomfort.  Now having only discomfort in the morning and by afternoon no significant pain described.  I do not think further antibiotics or steroids are indicated presently.  Particularly since ENT work-up was negative.  You had mentioned that you feel like you are having severe reflux with sour and acid the case in your throat region.  Sometimes feeling like acid might be going into your sinuses.  At this point recommend continuing Protonix daily, famotidine twice daily and will add on  Carafate.  Recommend following through with the EGD in February.  Would asked that she give me an update on Tuesday regarding as adding Carafate has decreased her symptoms overall.  Diabetes with recent mild sugar levels.  Recommend checking sugars twice daily once fasting in the morning and 1 postmeal.  Continue metformin and Farxiga.  Give me update on blood sugar readings on Tuesday as well.  Follow-up date to be determined based on update on Tuesday.  Or sooner if needed.

## 2021-01-31 DIAGNOSIS — Z20822 Contact with and (suspected) exposure to covid-19: Secondary | ICD-10-CM | POA: Diagnosis not present

## 2021-02-01 ENCOUNTER — Emergency Department (HOSPITAL_BASED_OUTPATIENT_CLINIC_OR_DEPARTMENT_OTHER)
Admission: EM | Admit: 2021-02-01 | Discharge: 2021-02-01 | Disposition: A | Discharge status: Home / Self Care | Payer: BC Managed Care – PPO | Attending: Emergency Medicine | Admitting: Emergency Medicine

## 2021-02-01 ENCOUNTER — Encounter: Payer: Self-pay | Admitting: Medical

## 2021-02-01 ENCOUNTER — Other Ambulatory Visit: Payer: Self-pay

## 2021-02-01 ENCOUNTER — Emergency Department (HOSPITAL_BASED_OUTPATIENT_CLINIC_OR_DEPARTMENT_OTHER): Payer: BC Managed Care – PPO

## 2021-02-01 ENCOUNTER — Encounter (HOSPITAL_BASED_OUTPATIENT_CLINIC_OR_DEPARTMENT_OTHER): Payer: Self-pay | Admitting: Emergency Medicine

## 2021-02-01 DIAGNOSIS — R200 Anesthesia of skin: Secondary | ICD-10-CM | POA: Diagnosis not present

## 2021-02-01 DIAGNOSIS — Z20822 Contact with and (suspected) exposure to covid-19: Secondary | ICD-10-CM | POA: Insufficient documentation

## 2021-02-01 DIAGNOSIS — J45909 Unspecified asthma, uncomplicated: Secondary | ICD-10-CM | POA: Insufficient documentation

## 2021-02-01 DIAGNOSIS — R29898 Other symptoms and signs involving the musculoskeletal system: Secondary | ICD-10-CM | POA: Diagnosis not present

## 2021-02-01 DIAGNOSIS — R202 Paresthesia of skin: Secondary | ICD-10-CM | POA: Insufficient documentation

## 2021-02-01 DIAGNOSIS — Z7984 Long term (current) use of oral hypoglycemic drugs: Secondary | ICD-10-CM | POA: Insufficient documentation

## 2021-02-01 DIAGNOSIS — Z79899 Other long term (current) drug therapy: Secondary | ICD-10-CM | POA: Insufficient documentation

## 2021-02-01 DIAGNOSIS — Z7951 Long term (current) use of inhaled steroids: Secondary | ICD-10-CM | POA: Insufficient documentation

## 2021-02-01 DIAGNOSIS — R29818 Other symptoms and signs involving the nervous system: Secondary | ICD-10-CM | POA: Diagnosis not present

## 2021-02-01 DIAGNOSIS — R000 Tachycardia, unspecified: Secondary | ICD-10-CM | POA: Diagnosis not present

## 2021-02-01 DIAGNOSIS — E119 Type 2 diabetes mellitus without complications: Secondary | ICD-10-CM | POA: Diagnosis not present

## 2021-02-01 LAB — URINALYSIS, ROUTINE W REFLEX MICROSCOPIC
Bilirubin Urine: NEGATIVE
Glucose, UA: >=500 mg/dL — AB
Hgb urine dipstick: NEGATIVE
Ketones, ur: NEGATIVE mg/dL
Leukocytes,Ua: NEGATIVE
Nitrite: NEGATIVE
Protein, ur: NEGATIVE mg/dL
Specific Gravity, Urine: <1.005 — ABNORMAL LOW (ref 1.005–1.030)
pH: 6 (ref 5.0–8.0)

## 2021-02-01 LAB — DIFFERENTIAL
Abs Immature Granulocytes: 0.06 10*3/uL (ref 0.00–0.07)
Basophils Absolute: 0 10*3/uL (ref 0.0–0.1)
Basophils Relative: 0 %
Eosinophils Absolute: 0 10*3/uL (ref 0.0–0.5)
Eosinophils Relative: 0 %
Immature Granulocytes: 1 %
Lymphocytes Relative: 11 %
Lymphs Abs: 1.4 10*3/uL (ref 0.7–4.0)
Monocytes Absolute: 0.5 10*3/uL (ref 0.1–1.0)
Monocytes Relative: 4 %
Neutro Abs: 10.3 10*3/uL — ABNORMAL HIGH (ref 1.7–7.7)
Neutrophils Relative %: 84 %

## 2021-02-01 LAB — COMPREHENSIVE METABOLIC PANEL
ALT: 36 U/L (ref 0–44)
AST: 27 U/L (ref 15–41)
Albumin: 4.1 g/dL (ref 3.5–5.0)
Alkaline Phosphatase: 54 U/L (ref 38–126)
Anion gap: 13 (ref 5–15)
BUN: 11 mg/dL (ref 6–20)
CO2: 23 mmol/L (ref 22–32)
Calcium: 9 mg/dL (ref 8.9–10.3)
Chloride: 102 mmol/L (ref 98–111)
Creatinine, Ser: 1.03 mg/dL (ref 0.61–1.24)
GFR, Estimated: >60 mL/min (ref >60)
Glucose, Bld: 207 mg/dL — ABNORMAL HIGH (ref 70–99)
Potassium: 3.6 mmol/L (ref 3.5–5.1)
Sodium: 138 mmol/L (ref 135–145)
Total Bilirubin: 0.4 mg/dL (ref 0.3–1.2)
Total Protein: 7.4 g/dL (ref 6.5–8.1)

## 2021-02-01 LAB — PROTIME-INR
INR: 0.9 (ref 0.8–1.2)
Prothrombin Time: 12.1 seconds (ref 11.4–15.2)

## 2021-02-01 LAB — RESP PANEL BY RT-PCR (FLU A&B, COVID) ARPGX2
Influenza A by PCR: NEGATIVE
Influenza B by PCR: NEGATIVE
SARS Coronavirus 2 by RT PCR: NEGATIVE

## 2021-02-01 LAB — CBC
HCT: 44.7 % (ref 39.0–52.0)
Hemoglobin: 15.1 g/dL (ref 13.0–17.0)
MCH: 29.8 pg (ref 26.0–34.0)
MCHC: 33.8 g/dL (ref 30.0–36.0)
MCV: 88.2 fL (ref 80.0–100.0)
Platelets: 195 10*3/uL (ref 150–400)
RBC: 5.07 MIL/uL (ref 4.22–5.81)
RDW: 13.6 % (ref 11.5–15.5)
WBC: 12.4 10*3/uL — ABNORMAL HIGH (ref 4.0–10.5)
nRBC: 0 % (ref 0.0–0.2)

## 2021-02-01 LAB — RAPID URINE DRUG SCREEN, HOSP PERFORMED
Amphetamines: NOT DETECTED
Barbiturates: NOT DETECTED
Benzodiazepines: NOT DETECTED
Cocaine: NOT DETECTED
Opiates: NOT DETECTED
Tetrahydrocannabinol: NOT DETECTED

## 2021-02-01 LAB — APTT: aPTT: 26 seconds (ref 24–36)

## 2021-02-01 LAB — URINALYSIS, MICROSCOPIC (REFLEX)

## 2021-02-01 LAB — CBG MONITORING, ED
Glucose-Capillary: 216 mg/dL — ABNORMAL HIGH (ref 70–99)
Glucose-Capillary: 218 mg/dL — ABNORMAL HIGH (ref 70–99)

## 2021-02-01 LAB — ETHANOL: Alcohol, Ethyl (B): <10 mg/dL (ref <10)

## 2021-02-01 LAB — TROPONIN I (HIGH SENSITIVITY): Troponin I (High Sensitivity): <2 ng/L (ref <18)

## 2021-02-01 MED ORDER — BENZONATATE 100 MG PO CAPS
200.0000 mg | ORAL_CAPSULE | Freq: Once | ORAL | Status: DC
  Filled 2021-02-01: qty 2

## 2021-02-01 MED ORDER — PROCHLORPERAZINE EDISYLATE 10 MG/2ML IJ SOLN
10.0000 mg | Freq: Once | INTRAMUSCULAR | Status: AC
  Administered 2021-02-01: 10 mg via INTRAVENOUS
  Filled 2021-02-01: qty 2

## 2021-02-01 MED ORDER — DIPHENHYDRAMINE HCL 50 MG/ML IJ SOLN
25.0000 mg | Freq: Once | INTRAMUSCULAR | Status: AC
  Administered 2021-02-01: 25 mg via INTRAVENOUS
  Filled 2021-02-01: qty 1

## 2021-02-01 MED ORDER — IOHEXOL 350 MG/ML SOLN
100.0000 mL | Freq: Once | INTRAVENOUS | Status: AC | PRN
  Administered 2021-02-01: 100 mL via INTRAVENOUS

## 2021-02-01 MED ORDER — SODIUM CHLORIDE 0.9 % IV BOLUS
1000.0000 mL | Freq: Once | INTRAVENOUS | Status: AC
  Administered 2021-02-01: 1000 mL via INTRAVENOUS

## 2021-02-01 NOTE — ED Triage Notes (Addendum)
Pt reports heart started racing at home; LUE numbness started after, about 1330; sts numbness is starting in LLE now

## 2021-02-01 NOTE — ED Notes (Signed)
Pt notified staff that he was leaving. 

## 2021-02-01 NOTE — ED Triage Notes (Signed)
Sent by [facility] for MRI due to left arm numbness

## 2021-02-01 NOTE — ED Notes (Signed)
BEFAST NEGATIVE °

## 2021-02-01 NOTE — Consult Note (Signed)
[facility] Telemedicine Consult   Requesting Provider: Casimer Lanius Consult Participants: Patient, Nurse, telestroke nurse Location of the provider: [facility] Location of the patient: [facility]  This consult was provided via telemedicine with 2-way video and audio communication. The patient/family was informed that care would be provided in this way and agreed to receive care in this manner.    Chief Complaint: Left-sided numbness  HPI: Patient was in his normal state of health at 130, then subsequently noticed some tingling that started on his left arm and then spread to his left trunk.  Left leg was involved some as well.  Due to the symptoms, a code stroke was activated.  On evaluation, his NIH was one.  He had a headache earlier, but none currently.    LKW: 13:30 tpa given?: No, out of window IR Thrombectomy? No, no LVO Modified Rankin Scale: 0-Completely asymptomatic and back to baseline post- stroke Time of teleneurologist evaluation: 15:11  Exam: Vitals:   02/01/21 1435  BP: 129/90  Pulse: (!) 128  Resp: 20  Temp: 98.9 F (37.2 C)  SpO2: 96%    General: 0  1A: Level of Consciousness - 0 1B: Ask Month and Age - 0 1C: 'Blink Eyes' & 'Squeeze Hands' - 0 2: Test Horizontal Extraocular Movements - 0 3: Test Visual Fields - 0 4: Test Facial Palsy - 0 5A: Test Left Arm Motor Drift - 0 5B: Test Right Arm Motor Drift - 0 6A: Test Left Leg Motor Drift - 0 6B: Test Right Leg Motor Drift - 0 7: Test Limb Ataxia - 0 8: Test Sensation - 0 9: Test Language/Aphasia- 0 10: Test Dysarthria - 0 11: Test Extinction/Inattention - 0 NIHSS score: 1   Imaging Reviewed: CT head-negative  Labs reviewed in epic and pertinent values follow: Glucose 207   Assessment: 41 year old male with left-sided numbness and tingling.  Given the presence of positive symptoms as well as a headache, I do suspect that he may have complicated  migraine.  Given his history of diabetes, I do think that ruling out acute ischemic stroke is necessary and would recommend transfer to an ED where an MRI could be performed.  Recommendations: 1) MRI brain 2) CT angiogram head and neck 3) if the above is negative, no further acute recommendations at this time and would treat as complicated migraine.    This patient is receiving care for possible acute neurological changes. There was 30 minutes of care by this provider at the time of service, including time for direct evaluation via telemedicine, review of medical records, imaging studies and discussion of findings with providers, the patient and/or family.  Ritta Slot, MD [facility] [phone]  If 7pm- 7am, please page neurology on call as listed in [facility].

## 2021-02-01 NOTE — ED Provider Notes (Addendum)
Patient signed out to me at 3 PM.  Code stroke initiated when patient arrived.  Around 130 he noticed left upper arm and left lower leg numbness.  Was having some palpitations.  Does have history of diabetes.  Thus far code stroke work-up was unremarkable.  He has had a CT scan of his head and CTA of his head and neck with no acute findings.  Dr. Amada Jupiter recommends MRI for further stroke rule out with neurology.  Suspicion for possibly complex migraine as he has been having headaches on and off for the last several months.  Had a mild headache when this first started but headache and numbness are improving.  Patient tachycardic but appears to be a sinus tachycardia.  Will give IV fluids and headache cocktail.  Of note he is recovering from a cold/epiglottitis per patient and per review of old charts.  He appears symptomatically improved from that standpoint but still has a chronic cough.  We will give him Tessalon Perle.  Patient to be transferred to [facility] for MRI.  Dr. Wallace Cullens excepting.  This chart was dictated using voice recognition software.  Despite best efforts to proofread,  errors can occur which can change the documentation meaning.    Virgina Norfolk, DO 02/01/21 1624    Virgina Norfolk, DO 02/08/21 1148

## 2021-02-01 NOTE — ED Notes (Signed)
Pt ambulatory to bathroom with steady gait, no assistance needed.

## 2021-02-01 NOTE — ED Notes (Signed)
Pt transported to CT ?

## 2021-02-01 NOTE — ED Notes (Signed)
Teleneurologist present on call.

## 2021-02-01 NOTE — ED Provider Notes (Signed)
[facility] EMERGENCY DEPARTMENT Provider Note   CSN: [id number] Arrival date & time: 02/01/21  1407     History  Chief Complaint  Patient presents with   Tachycardia   Numbness    Anthony Russell is a 41 y.o. male.  HPI     41yo male with history of DM, asthma, hyperlipidemia, back pain, history of lumbar microdiscectomy, recent diagnosis of laryngitis/mild epiglottitis, who presents with concern for palpitations and left sided tingling.   Symptoms began acutely at 130PM. Palpitations like heart racing, not true chest pain.  Then developed symptoms in left arm, tingling sensation, sensation of heaviness in the left arm, and then developed tingling along left side including left leg that began on arrival to ED.  Has numbness now of left leg.  No dyspnea.  Denies difficulty talking or walking, visual changes or facial droop.     Home Medications Prior to Admission medications   Medication Sig Start Date End Date Taking? Authorizing Provider  albuterol (VENTOLIN HFA) 108 (90 Base) MCG/ACT inhaler Inhale 2 puffs into the lungs every 6 (six) hours as needed for wheezing or shortness of breath. 08/11/20   Sharion Balloon, FNP  atorvastatin (LIPITOR) 10 MG tablet Take 1 tablet (10 mg total) by mouth daily. 08/16/19   Saguier, Percell Miller, PA-C  calcium carbonate (OS-CAL) 1250 (500 Ca) MG chewable tablet Chew 2 tablets by mouth daily.    [provider]  cefdinir (OMNICEF) 125 MG/5ML suspension Take 12 mLs (300 mg total) by mouth 2 (two) times daily. 01/18/21   Mar Daring, PA-C  clindamycin (CLEOCIN) 150 MG capsule Take 1 capsule (150 mg total) by mouth 3 (three) times daily. 01/18/21   Prosperi, Christian H, PA-C  dapagliflozin propanediol (FARXIGA) 5 MG TABS tablet Take 1 tablet (5 mg total) by mouth daily. 11/17/20   Saguier, Percell Miller, PA-C  famotidine (PEPCID) 20 MG tablet Take 1 tablet (20 mg total) by mouth at bedtime. 12/16/20   Willia Craze, NP   fluticasone (FLOVENT HFA) 110 MCG/ACT inhaler Inhale 2 puffs into the lungs 2 (two) times daily. 08/23/20   Shelda Pal, DO  glycopyrrolate (ROBINUL) 2 MG tablet Take 1 tablet (2 mg total) by mouth 2 (two) times daily as needed (Abdominal disccomfort). 12/16/20   Willia Craze, NP  indomethacin (INDOCIN) 25 MG capsule Take 1 capsule (25 mg total) by mouth 3 (three) times daily with meals. For five days. 10/19/20   Genia Harold, MD  L-Arginine 500 MG CAPS Take 2 capsules by mouth 2 (two) times daily. 08/30/20   [provider]  levocetirizine (XYZAL) 5 MG tablet Take 1 tablet (5 mg total) by mouth every evening. 11/28/18   Saguier, Percell Miller, PA-C  lidocaine (XYLOCAINE) 2 % solution 5 mL swish and swallow every 4 hours as needed for throat pain 01/18/21   Fenton Malling M, PA-C  metFORMIN (GLUCOPHAGE) 1000 MG tablet TAKE 1 TABLET(1000 MG) BY MOUTH TWICE DAILY WITH A MEAL 12/22/20   Saguier, Percell Miller, PA-C  Multiple Vitamin (MULTIVITAMIN ADULT PO) Take 2 tablets by mouth daily.    [provider]  Nutritional Supplements (PYCNOGENOL) 300-30 MG CAPS Take 1 capsule by mouth 3 (three) times daily. 08/30/20   [provider]  nystatin (MYCOSTATIN) 100000 UNIT/ML suspension Take 5 mLs (500,000 Units total) by mouth 4 (four) times daily. 01/18/21   Prosperi, Christian H, PA-C  pantoprazole (PROTONIX) 40 MG tablet Take 1 tablet (40 mg total) by mouth 2 (two)  times daily before a meal. 12/16/20   Willia Craze, NP  prednisoLONE (PRELONE) 15 MG/5ML SOLN Take 10 mLs (30 mg total) by mouth 2 (two) times daily. 01/18/21   Mar Daring, PA-C  propranolol (INDERAL) 20 MG tablet Take 1 tablet (20 mg total) by mouth 2 (two) times daily. 10/25/20   Genia Harold, MD  sucralfate (CARAFATE) 1 g tablet Take 1 tablet (1 g total) by mouth 4 (four) times daily -  with meals and at bedtime. 01/28/21   Saguier, Percell Miller, PA-C      Allergies    Patient has no known allergies.     Review of Systems   Review of Systems  Constitutional:  Negative for fever.  Eyes:  Negative for visual disturbance.  Respiratory:  Negative for shortness of breath.   Cardiovascular:  Positive for palpitations. Negative for chest pain.  Gastrointestinal:  Negative for vomiting.  Skin:  Negative for wound.  Neurological:  Positive for numbness. Negative for tremors, facial asymmetry, speech difficulty, weakness, light-headedness and headaches.   Physical Exam Updated Vital Signs BP 125/83    Pulse (!) 123    Temp 98.9 F (37.2 C) (Oral)    Resp (!) 26    Ht 5\' 10"  (1.778 m)    Wt 104.8 kg    SpO2 98%    BMI 33.15 kg/m  Physical Exam Vitals and nursing note reviewed.  Constitutional:      General: He is not in acute distress.    Appearance: He is well-developed. He is not diaphoretic.  HENT:     Head: Normocephalic and atraumatic.  Eyes:     General: No visual field deficit.    Conjunctiva/sclera: Conjunctivae normal.  Cardiovascular:     Rate and Rhythm: Normal rate and regular rhythm.     Heart sounds: Normal heart sounds. No murmur heard.   No friction rub. No gallop.  Pulmonary:     Effort: Pulmonary effort is normal. No respiratory distress.     Breath sounds: Normal breath sounds. No wheezing or rales.  Abdominal:     General: There is no distension.     Palpations: Abdomen is soft.     Tenderness: There is no abdominal tenderness. There is no guarding.  Musculoskeletal:     Cervical back: Normal range of motion.  Skin:    General: Skin is warm and dry.  Neurological:     Mental Status: He is alert and oriented to person, place, and time.     GCS: GCS eye subscore is 4. GCS verbal subscore is 5. GCS motor subscore is 6.     Cranial Nerves: No cranial nerve deficit, dysarthria or facial asymmetry.     Sensory: Sensory deficit (left arm and leg) present.     Motor: No weakness.    ED Results / Procedures / Treatments   Labs (all labs ordered are listed, but  only abnormal results are displayed) Labs Reviewed  CBC - Abnormal; Notable for the following components:      Result Value   WBC 12.4 (*)    All other components within normal limits  DIFFERENTIAL - Abnormal; Notable for the following components:   Neutro Abs 10.3 (*)    All other components within normal limits  COMPREHENSIVE METABOLIC PANEL - Abnormal; Notable for the following components:   Glucose, Bld 207 (*)    All other components within normal limits  URINALYSIS, ROUTINE W REFLEX MICROSCOPIC - Abnormal; Notable for the following components:  Specific Gravity, Urine <1.005 (*)    Glucose, UA >=500 (*)    All other components within normal limits  URINALYSIS, MICROSCOPIC (REFLEX) - Abnormal; Notable for the following components:   Bacteria, UA FEW (*)    All other components within normal limits  CBG MONITORING, ED - Abnormal; Notable for the following components:   Glucose-Capillary 218 (*)    All other components within normal limits  CBG MONITORING, ED - Abnormal; Notable for the following components:   Glucose-Capillary 216 (*)    All other components within normal limits  RESP PANEL BY RT-PCR (FLU A&B, COVID) ARPGX2  ETHANOL  PROTIME-INR  APTT  RAPID URINE DRUG SCREEN, HOSP PERFORMED  TROPONIN I (HIGH SENSITIVITY)    EKG EKG Interpretation  Date/Time:  Tuesday February 01 2021 15:02:03 EST Ventricular Rate:  124 PR Interval:  132 QRS Duration: 101 QT Interval:  319 QTC Calculation: 459 R Axis:   102 Text Interpretation: Sinus tachycardia Right axis deviation Nonspecific T abnormalities, inferior leads Confirmed by Lennice Sites ([id number]) on 02/01/2021 3:14:49 PM  Radiology CT HEAD CODE STROKE WO CONTRAST  Result Date: 02/01/2021 CLINICAL DATA:  Code stroke. Neuro deficit, acute, stroke suspected; left extremity numbness EXAM: CT HEAD WITHOUT CONTRAST TECHNIQUE: Contiguous axial images were obtained from the base of the skull through the vertex without intravenous  contrast. COMPARISON:  August 2022 FINDINGS: Brain: There is no acute intracranial hemorrhage, mass effect, or edema. Gray-white differentiation is preserved. Ventricles and sulci are normal in size and configuration. No extra-axial collection. Vascular: No hyperdense vessel. Skull: Unremarkable. Sinuses/Orbits: Unremarkable. Other: Mastoid air cells are clear. ASPECTS ([place] Stroke Program Early CT Score) - Ganglionic level infarction (caudate, lentiform nuclei, internal capsule, insula, M1-M3 cortex): 7 - Supraganglionic infarction (M4-M6 cortex): 3 Total score (0-10 with 10 being normal): 10 IMPRESSION: There is no acute intracranial hemorrhage or evidence of acute infarction. ASPECT score is 10. These results were called by telephone at the time of interpretation on 02/01/2021 at 2:59 pm to provider [facility] , who verbally acknowledged these results. Electronically Signed   By: Macy Mis M.D.   On: 02/01/2021 15:01   CT ANGIO HEAD NECK W WO CM (CODE STROKE)  Result Date: 02/01/2021 CLINICAL DATA:  Left upper extremity numbness following tachycardia. Left lower extremity weakness. EXAM: CT ANGIOGRAPHY HEAD AND NECK TECHNIQUE: Multidetector CT imaging of the head and neck was performed using the standard protocol during bolus administration of intravenous contrast. Multiplanar CT image reconstructions and MIPs were obtained to evaluate the vascular anatomy. Carotid stenosis measurements (when applicable) are obtained utilizing NASCET criteria, using the distal internal carotid diameter as the denominator. CONTRAST:  168mL OMNIPAQUE IOHEXOL 350 MG/ML SOLN COMPARISON:  Head CT earlier same day.  MRI 09/08/2020. FINDINGS: CTA NECK FINDINGS Aortic arch: Normal Right carotid system: Normal. No atherosclerotic disease or stenosis. Cervical ICA is normal. Left carotid system: Normal. No atherosclerotic disease or stenosis. Cervical ICA is normal. Vertebral arteries: Normal.  Left vertebral artery is  dominant. Skeleton: Ossification of the posterior longitudinal ligament at C2-3, C3-4 and C4-5 with spinal stenosis. Other neck: No mass or lymphadenopathy. Upper chest: Normal Review of the MIP images confirms the above findings CTA HEAD FINDINGS Anterior circulation: Both internal carotid arteries widely patent through the skull base and siphon regions. No siphon stenosis. The anterior and middle cerebral vessels are patent. No large vessel occlusion or proximal stenosis. No aneurysm or vascular malformation. Posterior circulation: Both vertebral arteries widely patent through the foramen  magnum to the basilar. No basilar stenosis. Posterior circulation branch vessels are normal. Venous sinuses: Patent and normal. Anatomic variants: None significant. Review of the MIP images confirms the above findings IMPRESSION: Normal CT angiography of the neck and intracranial vessels. No large vessel occlusion. Ossification of posterior longitudinal ligament in the upper to mid cervical region which causes at least a degree of cervical spinal stenosis. Electronically Signed   By: Nelson Chimes M.D.   On: 02/01/2021 16:14    Procedures Procedures    Medications Ordered in ED Medications  sodium chloride 0.9 % bolus 1,000 mL (0 mLs Intravenous Stopped 02/01/21 1803)  iohexol (OMNIPAQUE) 350 MG/ML injection 100 mL (100 mLs Intravenous Contrast Given 02/01/21 1550)  prochlorperazine (COMPAZINE) injection 10 mg (10 mg Intravenous Given 02/01/21 1642)  diphenhydrAMINE (BENADRYL) injection 25 mg (25 mg Intravenous Given 02/01/21 1643)    ED Course/ Medical Decision Making/ A&P                           Medical Decision Making   41yo male with history of DM, asthma, hyperlipidemia, back pain, history of lumbar microdiscectomy, recent diagnosis of laryngitis/mild epiglottitis, who presents with concern for palpitations and left sided tingling.   CODE STROKE called given timing of symptoms, concern for numbness, subjective  weakness, risk factors.  EKG with sinus tachycardia.  Troponin ordered and pending.  CT head without acute abnormalities. Continued code stroke eval with Neurology ongoign at time of transfer of care to Dr. Ronnald Nian.         Final Clinical Impression(s) / ED Diagnoses Final diagnoses:  Paresthesia    Rx / DC Orders ED Discharge Orders     None         Gareth Morgan, MD 02/01/21 2226

## 2021-02-02 ENCOUNTER — Telehealth: Payer: Self-pay | Admitting: Medical

## 2021-02-02 NOTE — Telephone Encounter (Signed)
Patient's mom called with concerns regarding the MRI that is needed for her son. She was informed of Edward's recommendation and that going to [facility] or [facility] might be the fastest route to an MRI. Pt's mother stated the patient did not want to go back to the ER due to wait time and bad congestion.Pt's mom stated she though Ramon Dredge could just schedule an MRI for her son and he could get it done by Saturday by the latest. She was informed that there are some steps that have to be taken before that can be set up which would take longer. She stated she would talk to her son and figure out what he would do.

## 2021-02-02 NOTE — Telephone Encounter (Signed)
error 

## 2021-02-04 ENCOUNTER — Ambulatory Visit (INDEPENDENT_AMBULATORY_CARE_PROVIDER_SITE_OTHER): Payer: BC Managed Care – PPO | Admitting: Medical

## 2021-02-04 ENCOUNTER — Telehealth: Payer: Self-pay | Admitting: Medical

## 2021-02-04 VITALS — BP 129/78 | HR 100 | Temp 98.5°F | Resp 16 | Ht 70.0 in | Wt 232.6 lb

## 2021-02-04 DIAGNOSIS — R200 Anesthesia of skin: Secondary | ICD-10-CM

## 2021-02-04 DIAGNOSIS — R202 Paresthesia of skin: Secondary | ICD-10-CM

## 2021-02-04 DIAGNOSIS — G43819 Other migraine, intractable, without status migrainosus: Secondary | ICD-10-CM

## 2021-02-04 DIAGNOSIS — E119 Type 2 diabetes mellitus without complications: Secondary | ICD-10-CM | POA: Diagnosis not present

## 2021-02-04 DIAGNOSIS — R29898 Other symptoms and signs involving the musculoskeletal system: Secondary | ICD-10-CM | POA: Diagnosis not present

## 2021-02-04 NOTE — Patient Instructions (Signed)
Recent event of headache, left arm heaviness and tingling.  CT  head without contrast and CTA head were both negative.  In the emergency department it was recommended that MRI be done to further evaluate for possible stroke.  Unfortunate that was never done.  Thankfully all sinus symptoms have resolved.  Emergency department MD and other specialist do not probable complex migraine headache.  Patient has been seen by neurologist who thinks patient has likely migraine headaches as well.   Normal neurologic exam today.  At this point we will try to schedule MRI outpatient early next week.  Order has been placed and will need to try to get prior authorization on Monday.  Sent message to staff to see if that can be done and hopefully get scheduled by Wednesday.  During the interim if you have any recurrent headaches with motor or sensory deficits after recommend to be seen in the emergency department again.  Recommend getting back on beta-blocker that was prescribed by neurologist.  Beta-blockers can prevent migraine headaches.  Unfortunately you had reaction to Imitrex and Fioricet was over sedating.  Keep appointment with neurologist in February.  Diabetes-please take your diabetic medications.  Placed future order to get A1c after January 18.  Metabolic panel will be done on that day as well.  You can call your insurance company to see if you might qualify for CGM.  Follow-up date to be determined after labs as well as after hopefully MRI review.

## 2021-02-04 NOTE — Progress Notes (Signed)
Subjective:    Patient ID: Anthony Russell, male    DOB: Dec 26, 1980, 41 y.o.   MRN: [id number]  HPI Pt in for follow up Friday late afternoon/last appt.   He was seen the other day in the ED on 02-01-2021.  " Chief Complaint  Patient presents with   Tachycardia   Numbness      Anthony Russell is a 41 y.o. male.   HPI     41yo male with history of DM, asthma, hyperlipidemia, back pain, history of lumbar microdiscectomy, recent diagnosis of laryngitis/mild epiglottitis, who presents with concern for palpitations and left sided tingling.    Symptoms began acutely at 130PM. Palpitations like heart racing, not true chest pain.  Then developed symptoms in left arm, tingling sensation, sensation of heaviness in the left arm, and then developed tingling along left side including left leg that began on arrival to ED.  Has numbness now of left leg.  No dyspnea.  Denies difficulty talking or walking, visual changes or facial droop. "  His vitals day of ED visit "Updated Vital Signs BP 125/83    Pulse (!) 123    Temp 98.9 F (37.2 C) (Oral)    Resp (!) 26    Ht 5\' 10"  (1.778 m)    Wt 104.8 kg    SpO2 98%    BMI 33.15 kg/m "  Neuro exam day of ED visit. "Neurological:     Mental Status: He is alert and oriented to person, place, and time.     GCS: GCS eye subscore is 4. GCS verbal subscore is 5. GCS motor subscore is 6.     Cranial Nerves: No cranial nerve deficit, dysarthria or facial asymmetry.     Sensory: Sensory deficit (left arm and leg) present.     Motor: No weakness."  Sugar was 216. His wbc was 12.4.  Text Interpretation:      Sinus tachycardia Right axis deviation Nonspecific T abnormalities, inferior leads Confirmed by , Adam ([id number]) on 02/01/2021 3:14:49 PM  Code stroke called. CODE STROKE called given timing of symptoms, concern for numbness, subjective weakness, risk factors.  EKG with sinus tachycardia.  Troponin ordered and pending.   CT head without acute  abnormalities. Continued code stroke eval with Neurology ongoign at time of transfer of care to Dr. 04/01/2021  Pt also had CTA  IMPRESSION: Normal CT angiography of the neck and intracranial vessels. No large vessel occlusion.   Later ED Dr. Lockie Mola noted.  "Dr. Pernell Dupre recommends MRI for further stroke rule out with neurology.  Suspicion for possibly complex migraine as he has been having headaches on and off for the last several months.  Had a mild headache when this first started but headache and numbness are improving.  Patient tachycardic but appears to be a sinus tachycardia.  Will give IV fluids and headache cocktail.  Of note he is recovering from a cold/epiglottitis per patient and per review of old charts.  He appears symptomatically improved from that standpoint but still has a chronic cough.  We will give him Tessalon Perle.  Patient to be transferred to [facility] for MRI.  [facility]. [facility] excepting."  Pt left ED before getting mri.  He sent me various message after ED visit asking about getting mri. My schedule was full and I explained that it is best for him to return to the emergency department to get MRI as MRIs typically require prior authorization.  From the primary care standpoint  we typically have to see patient document office visit and reason for doing the MRI then wait for prior authorization which may or may not happen/be approved.  In light of the potential urgency of the situation advised to return to an ED that has MRI capability.  Patient did not get the study done and am now seeing him Friday late afternoon.   Current symptoms below.  See ROS.     Pt tells me that he feels like he got overeacted to sugar sugar change. His sugar was 102 then was 219. He actually thought 102 was low and then got more nervous when went up.   He ate various food as he though his sugar was low.  Pt did have ha before the tingling and heavy sensation in arm came.  Pt tells me that he  actually did get transferred by [facility] for mri. But there was prolonged wait of 2 hours and order was never placed for mri. He waited about another hour then left.  He states ha, tingling and arm heaviness did resolve.   Pt states he was skipping his diabetic meds and stomach meds. His sugar was 299 this morning. He did take meds later in day.   Pt has appt with neurologist to discuss poist coital headache. Appt with neurologist. Pt was taking propanolol. But he stopped about one week ago. Pt does report getting some ha with light senstivity for about 10 years. Frequency about once every 3-4 months. States in past would just go to dark room and sleep off/wait out ha.  Review of Systems  Constitutional:  Negative for chills, fatigue and fever.  Respiratory:  Negative for cough, chest tightness, shortness of breath and wheezing.   Cardiovascular:  Negative for chest pain and palpitations.  Gastrointestinal:  Negative for abdominal pain, constipation and diarrhea.  Musculoskeletal:  Negative for back pain, joint swelling and myalgias.  Skin:  Negative for rash.  Neurological:  Negative for dizziness, syncope, speech difficulty, weakness, numbness and headaches.  Hematological:  Negative for adenopathy. Does not bruise/bleed easily.  Psychiatric/Behavioral:  Negative for behavioral problems, confusion and dysphoric mood. The patient is not nervous/anxious.    Past Medical History:  Diagnosis Date   Allergy    Allergy, unspecified not elsewhere classified    rx w/ OTC antihistamines PRN   Anxiety    Asthma    Atypical chest pain    recent neg eval w/ normal cxr/ekg   Depression    DM (diabetes mellitus) (HCC)    Dyspepsia    on protonix 40mg /d   GERD (gastroesophageal reflux disease)    on protonix 40mg /d   HTN (hypertension)    Hyperlipidemia    on diet alone   IBS (irritable bowel syndrome)    Lumbar back pain    s/p lumbar laminectomy 2007 by DrCabbell   Migraines     Overweight(278.02)    weight Jan10=246#.[name].he was 225# in 9/07...diet and exercise was discussed   Sleep apnea      Social History   Socioeconomic History   Marital status: Single    Spouse name: Not on file   Number of children: 0   Years of education: Not on file   Highest education level: Not on file  Occupational History   Occupation: Systems analyst  Tobacco Use   Smoking status: Former    Packs/day: 0.10    Years: 8.00    Pack years: 0.80    Types: Cigarettes  Quit date: 03/03/2011    Years since quitting: 9.9   Smokeless tobacco: Never  Vaping Use   Vaping Use: Never used  Substance and Sexual Activity   Alcohol use: Yes    Comment: once or twice a month   Drug use: No   Sexual activity: Not on file  Other Topics Concern   Not on file  Social History Narrative   3 cups caffeine a day   Social Determinants of Health   Financial Resource Strain: Not on file  Food Insecurity: Not on file  Transportation Needs: Not on file  Physical Activity: Not on file  Stress: Not on file  Social Connections: Not on file  Intimate Partner Violence: Not on file    Past Surgical History:  Procedure Laterality Date   DENTAL SURGERY     LUMBAR LAMINECTOMY  01/30/2005   DrCabbell   TEAR DUCT PROBING      Family History  Problem Relation Age of Onset   Allergies Mother    Irritable bowel syndrome Mother    Diabetes Mother    Allergies Father    Diabetes Father    Hyperlipidemia Father    Irritable bowel syndrome Brother    Heart disease Brother    Allergies Brother    Breast cancer Maternal Aunt    Colon cancer Neg Hx     No Known Allergies  Current Outpatient Medications on File Prior to Visit  Medication Sig Dispense Refill   albuterol (VENTOLIN HFA) 108 (90 Base) MCG/ACT inhaler Inhale 2 puffs into the lungs every 6 (six) hours as needed for wheezing or shortness of breath. 8 g 0   atorvastatin (LIPITOR) 10 MG tablet Take 1 tablet (10 mg total) by mouth  daily. 30 tablet 11   calcium carbonate (OS-CAL) 1250 (500 Ca) MG chewable tablet Chew 2 tablets by mouth daily.     cefdinir (OMNICEF) 125 MG/5ML suspension Take 12 mLs (300 mg total) by mouth 2 (two) times daily. 240 mL 0   clindamycin (CLEOCIN) 150 MG capsule Take 1 capsule (150 mg total) by mouth 3 (three) times daily. 21 capsule 0   dapagliflozin propanediol (FARXIGA) 5 MG TABS tablet Take 1 tablet (5 mg total) by mouth daily. 30 tablet 3   famotidine (PEPCID) 20 MG tablet Take 1 tablet (20 mg total) by mouth at bedtime. 30 tablet 3   fluticasone (FLOVENT HFA) 110 MCG/ACT inhaler Inhale 2 puffs into the lungs 2 (two) times daily. 1 each 0   glycopyrrolate (ROBINUL) 2 MG tablet Take 1 tablet (2 mg total) by mouth 2 (two) times daily as needed (Abdominal disccomfort). 60 tablet 2   indomethacin (INDOCIN) 25 MG capsule Take 1 capsule (25 mg total) by mouth 3 (three) times daily with meals. For five days. 15 capsule 2   L-Arginine 500 MG CAPS Take 2 capsules by mouth 2 (two) times daily.     levocetirizine (XYZAL) 5 MG tablet Take 1 tablet (5 mg total) by mouth every evening. 30 tablet 3   lidocaine (XYLOCAINE) 2 % solution 5 mL swish and swallow every 4 hours as needed for throat pain 100 mL 0   metFORMIN (GLUCOPHAGE) 1000 MG tablet TAKE 1 TABLET(1000 MG) BY MOUTH TWICE DAILY WITH A MEAL 180 tablet 1   Multiple Vitamin (MULTIVITAMIN ADULT PO) Take 2 tablets by mouth daily.     Nutritional Supplements (PYCNOGENOL) 300-30 MG CAPS Take 1 capsule by mouth 3 (three) times daily.     nystatin (MYCOSTATIN)  100000 UNIT/ML suspension Take 5 mLs (500,000 Units total) by mouth 4 (four) times daily. 60 mL 0   pantoprazole (PROTONIX) 40 MG tablet Take 1 tablet (40 mg total) by mouth 2 (two) times daily before a meal. 60 tablet 6   prednisoLONE (PRELONE) 15 MG/5ML SOLN Take 10 mLs (30 mg total) by mouth 2 (two) times daily. 200 mL 0   propranolol (INDERAL) 20 MG tablet Take 1 tablet (20 mg total) by mouth 2  (two) times daily. 60 tablet 3   sucralfate (CARAFATE) 1 g tablet Take 1 tablet (1 g total) by mouth 4 (four) times daily -  with meals and at bedtime. 120 tablet 0   No current facility-administered medications on file prior to visit.    BP 129/78    Pulse 100    Temp 98.5 F (36.9 C)    Resp 16    Ht 5\' 10"  (1.778 m)    Wt 232 lb 9.6 oz (105.5 kg)    SpO2 98%    BMI 33.37 kg/m        Objective:   Physical Exam  General Mental Status- Alert. General Appearance- Not in acute distress.   Skin General: Color- Normal Color. Moisture- Normal Moisture.  Neck Carotid Arteries- Normal color. Moisture- Normal Moisture. No carotid bruits. No JVD.  Chest and Lung Exam Auscultation: Breath Sounds:-Normal.  Cardiovascular Auscultation:Rythm- Regular. Murmurs & Other Heart Sounds:Auscultation of the heart reveals- No Murmurs.  Abdomen Inspection:-Inspeection Normal. Palpation/Percussion:Note:No mass. Palpation and Percussion of the abdomen reveal- Non Tender, Non Distended + BS, no rebound or guarding.    Neurologic Cranial Nerve exam:- CN III-XII intact(No nystagmus), symmetric smile. Drift Test:- No drift. Romberg Exam:- Negative.  Heal to Toe Gait exam:-Normal. Finger to Nose:- Normal/Intact Strength:- 5/5 equal and symmetric strength both upper and lower extremities.       Assessment & Plan:   Patient Instructions  Recent event of headache, left arm heaviness and tingling.  CT  head without contrast and CTA head were both negative.  In the emergency department it was recommended that MRI be done to further evaluate for possible stroke.  Unfortunate that was never done.  Thankfully all sinus symptoms have resolved.  Emergency department MD and other specialist do not probable complex migraine headache.  Patient has been seen by neurologist who thinks patient has likely migraine headaches as well.   Normal neurologic exam today.  At this point we will try to schedule MRI  outpatient early next week.  Order has been placed and will need to try to get prior authorization on Monday.  Sent message to staff to see if that can be done and hopefully get scheduled by Wednesday.  During the interim if you have any recurrent headaches with motor or sensory deficits after recommend to be seen in the emergency department again.  Recommend getting back on beta-blocker that was prescribed by neurologist.  Beta-blockers can prevent migraine headaches.  Unfortunately you had reaction to Imitrex and Fioricet was over sedating.  Keep appointment with neurologist in February.  Diabetes-please take your diabetic medications.  Placed future order to get A1c after January 18.  Metabolic panel will be done on that day as well.  You can call your insurance company to see if you might qualify for CGM.  Follow-up date to be determined after labs as well as after hopefully MRI review.   Esperanza RichtersEdward Jessly Lebeck, PA-C    Time spent with patient today was 50  minutes which  consisted of chart revdiew, discussing diagnosis, work up treatment and documentation.

## 2021-02-04 NOTE — Telephone Encounter (Signed)
I placed mri brain order today. Will you try to get prior auth on Monday and have him get study by wedn.

## 2021-02-06 ENCOUNTER — Encounter: Payer: Self-pay | Admitting: Medical

## 2021-02-07 ENCOUNTER — Ambulatory Visit: Payer: BC Managed Care – PPO | Admitting: Medical

## 2021-02-07 ENCOUNTER — Encounter: Payer: Self-pay | Admitting: Medical

## 2021-02-09 ENCOUNTER — Other Ambulatory Visit: Payer: Self-pay | Admitting: Medical

## 2021-02-09 ENCOUNTER — Other Ambulatory Visit: Payer: Self-pay | Admitting: Family Medicine

## 2021-02-09 ENCOUNTER — Telehealth: Payer: Self-pay | Admitting: Medical

## 2021-02-09 DIAGNOSIS — J4520 Mild intermittent asthma, uncomplicated: Secondary | ICD-10-CM

## 2021-02-09 MED ORDER — ALBUTEROL SULFATE HFA 108 (90 BASE) MCG/ACT IN AERS: 2.0000 | INHALATION_SPRAY | Freq: Four times a day (QID) | RESPIRATORY_TRACT | 0 refills | Status: DC | PRN

## 2021-02-09 NOTE — Telephone Encounter (Signed)
Medication: albuterol (VENTOLIN HFA) 108 (90 Base) MCG/ACT inhaler   Has the patient contacted their pharmacy? Yes.   (If no, request that the patient contact the pharmacy for the refill.) (If yes, when and what did the pharmacy advise?)  Preferred Pharmacy (with phone number or street name):[id number] - [place], [place] - [address]  [address], [place] [address]  Phone:  [phone] Fax:  [phone]   Agent: Please be advised that RX refills may take up to 3 business days. We ask that you follow-up with your pharmacy. 

## 2021-02-09 NOTE — Telephone Encounter (Signed)
Rx sent 

## 2021-02-10 ENCOUNTER — Ambulatory Visit (HOSPITAL_COMMUNITY)
Admission: RE | Admit: 2021-02-10 | Discharge: 2021-02-10 | Disposition: A | Discharge status: Home / Self Care | Payer: BC Managed Care – PPO | Source: Ambulatory Visit | Attending: Medical | Admitting: Medical

## 2021-02-10 ENCOUNTER — Encounter: Payer: Self-pay | Admitting: Medical

## 2021-02-10 ENCOUNTER — Other Ambulatory Visit: Payer: Self-pay

## 2021-02-10 DIAGNOSIS — R200 Anesthesia of skin: Secondary | ICD-10-CM | POA: Insufficient documentation

## 2021-02-10 DIAGNOSIS — R202 Paresthesia of skin: Secondary | ICD-10-CM | POA: Insufficient documentation

## 2021-02-10 DIAGNOSIS — R29898 Other symptoms and signs involving the musculoskeletal system: Secondary | ICD-10-CM | POA: Diagnosis not present

## 2021-02-11 ENCOUNTER — Emergency Department (HOSPITAL_BASED_OUTPATIENT_CLINIC_OR_DEPARTMENT_OTHER)
Admission: EM | Admit: 2021-02-11 | Discharge: 2021-02-11 | Disposition: A | Discharge status: Home / Self Care | Payer: BC Managed Care – PPO | Attending: Emergency Medicine | Admitting: Emergency Medicine

## 2021-02-11 ENCOUNTER — Emergency Department (HOSPITAL_BASED_OUTPATIENT_CLINIC_OR_DEPARTMENT_OTHER): Payer: BC Managed Care – PPO | Admitting: Radiology

## 2021-02-11 ENCOUNTER — Other Ambulatory Visit: Payer: Self-pay

## 2021-02-11 ENCOUNTER — Emergency Department (HOSPITAL_BASED_OUTPATIENT_CLINIC_OR_DEPARTMENT_OTHER): Payer: BC Managed Care – PPO

## 2021-02-11 ENCOUNTER — Encounter (HOSPITAL_BASED_OUTPATIENT_CLINIC_OR_DEPARTMENT_OTHER): Payer: Self-pay | Admitting: Emergency Medicine

## 2021-02-11 DIAGNOSIS — R0602 Shortness of breath: Secondary | ICD-10-CM | POA: Diagnosis not present

## 2021-02-11 DIAGNOSIS — J40 Bronchitis, not specified as acute or chronic: Secondary | ICD-10-CM | POA: Diagnosis not present

## 2021-02-11 DIAGNOSIS — R052 Subacute cough: Secondary | ICD-10-CM | POA: Diagnosis not present

## 2021-02-11 DIAGNOSIS — R002 Palpitations: Secondary | ICD-10-CM | POA: Diagnosis not present

## 2021-02-11 DIAGNOSIS — R079 Chest pain, unspecified: Secondary | ICD-10-CM

## 2021-02-11 DIAGNOSIS — R0789 Other chest pain: Secondary | ICD-10-CM | POA: Diagnosis not present

## 2021-02-11 LAB — CBC
HCT: 46 % (ref 39.0–52.0)
Hemoglobin: 14.7 g/dL (ref 13.0–17.0)
MCH: 28.7 pg (ref 26.0–34.0)
MCHC: 32 g/dL (ref 30.0–36.0)
MCV: 89.8 fL (ref 80.0–100.0)
Platelets: 186 10*3/uL (ref 150–400)
RBC: 5.12 MIL/uL (ref 4.22–5.81)
RDW: 13.9 % (ref 11.5–15.5)
WBC: 6.8 10*3/uL (ref 4.0–10.5)
nRBC: 0 % (ref 0.0–0.2)

## 2021-02-11 LAB — BASIC METABOLIC PANEL
Anion gap: 9 (ref 5–15)
BUN: 16 mg/dL (ref 6–20)
CO2: 31 mmol/L (ref 22–32)
Calcium: 9.9 mg/dL (ref 8.9–10.3)
Chloride: 103 mmol/L (ref 98–111)
Creatinine, Ser: 1.05 mg/dL (ref 0.61–1.24)
GFR, Estimated: >60 mL/min (ref >60)
Glucose, Bld: 109 mg/dL — ABNORMAL HIGH (ref 70–99)
Potassium: 3.9 mmol/L (ref 3.5–5.1)
Sodium: 143 mmol/L (ref 135–145)

## 2021-02-11 LAB — TROPONIN I (HIGH SENSITIVITY): Troponin I (High Sensitivity): <2 ng/L (ref <18)

## 2021-02-11 MED ORDER — IOHEXOL 350 MG/ML SOLN
100.0000 mL | Freq: Once | INTRAVENOUS | Status: AC | PRN
  Administered 2021-02-11: 100 mL via INTRAVENOUS

## 2021-02-11 NOTE — ED Triage Notes (Signed)
Pt states he was recently seen sore throat, given steroids and abx. States he still feels congested but in the last week, he has had chest tightness and palpitations. Worse when sitting or laying down. MRI done yesterday to rule out a stroke.

## 2021-02-11 NOTE — ED Notes (Signed)
Pt provided discharge instructions and prescription information. Pt was given the opportunity to ask questions and questions were answered. Discharge signature not obtained in the setting of the COVID-19 pandemic in order to reduce high touch surfaces.  ° °

## 2021-02-11 NOTE — ED Provider Notes (Signed)
[facility] EMERGENCY DEPT Provider Note   CSN: [id number] Arrival date & time: 02/11/21  [id number]     History  Chief Complaint  Patient presents with   Chest Pain    Anthony Russell is a 41 y.o. male.  HPI Patient reports he is having persisting chest pain in his central chest that is waxing and waning.  He reports sometimes he perceives associated congestion and coughs a small amount.  However, he denies he had any fever chills or body aches he reports it does feel more painful when he is either lying flat or leaning forward.  He also notes that he slightly more short of breath.  Patient denies lower extremity swelling or calf pain.  He did have recent severe pharyngitis requiring steroids and antibiotics.     Home Medications Prior to Admission medications   Medication Sig Start Date End Date Taking? Authorizing Provider  albuterol (VENTOLIN HFA) 108 (90 Base) MCG/ACT inhaler INHALE 2 PUFFS INTO THE LUNGS EVERY 6 HOURS AS NEEDED FOR WHEEZING OR SHORTNESS OF BREATH 02/09/21   Saguier, Percell Miller, PA-C  atorvastatin (LIPITOR) 10 MG tablet Take 1 tablet (10 mg total) by mouth daily. 08/16/19   Saguier, Percell Miller, PA-C  calcium carbonate (OS-CAL) 1250 (500 Ca) MG chewable tablet Chew 2 tablets by mouth daily.    [provider]  cefdinir (OMNICEF) 125 MG/5ML suspension Take 12 mLs (300 mg total) by mouth 2 (two) times daily. 01/18/21   Mar Daring, PA-C  clindamycin (CLEOCIN) 150 MG capsule Take 1 capsule (150 mg total) by mouth 3 (three) times daily. 01/18/21   Prosperi, Christian H, PA-C  dapagliflozin propanediol (FARXIGA) 5 MG TABS tablet Take 1 tablet (5 mg total) by mouth daily. 11/17/20   Saguier, Percell Miller, PA-C  famotidine (PEPCID) 20 MG tablet Take 1 tablet (20 mg total) by mouth at bedtime. 12/16/20   Willia Craze, NP  FLOVENT HFA 110 MCG/ACT inhaler INHALE 2 PUFFS INTO THE LUNGS TWICE DAILY 02/09/21   Shelda Pal, DO  glycopyrrolate (ROBINUL)  2 MG tablet Take 1 tablet (2 mg total) by mouth 2 (two) times daily as needed (Abdominal disccomfort). 12/16/20   Willia Craze, NP  indomethacin (INDOCIN) 25 MG capsule Take 1 capsule (25 mg total) by mouth 3 (three) times daily with meals. For five days. 10/19/20   Genia Harold, MD  L-Arginine 500 MG CAPS Take 2 capsules by mouth 2 (two) times daily. 08/30/20   [provider]  levocetirizine (XYZAL) 5 MG tablet Take 1 tablet (5 mg total) by mouth every evening. 11/28/18   Saguier, Percell Miller, PA-C  lidocaine (XYLOCAINE) 2 % solution 5 mL swish and swallow every 4 hours as needed for throat pain 01/18/21   Fenton Malling M, PA-C  metFORMIN (GLUCOPHAGE) 1000 MG tablet TAKE 1 TABLET(1000 MG) BY MOUTH TWICE DAILY WITH A MEAL 12/22/20   Saguier, Percell Miller, PA-C  Multiple Vitamin (MULTIVITAMIN ADULT PO) Take 2 tablets by mouth daily.    [provider]  Nutritional Supplements (PYCNOGENOL) 300-30 MG CAPS Take 1 capsule by mouth 3 (three) times daily. 08/30/20   [provider]  nystatin (MYCOSTATIN) 100000 UNIT/ML suspension Take 5 mLs (500,000 Units total) by mouth 4 (four) times daily. 01/18/21   Prosperi, Christian H, PA-C  pantoprazole (PROTONIX) 40 MG tablet Take 1 tablet (40 mg total) by mouth 2 (two) times daily before a meal. 12/16/20   Willia Craze, NP  prednisoLONE (PRELONE) 15 MG/5ML SOLN Take 10 mLs (  30 mg total) by mouth 2 (two) times daily. 01/18/21   Mar Daring, PA-C  propranolol (INDERAL) 20 MG tablet Take 1 tablet (20 mg total) by mouth 2 (two) times daily. 10/25/20   Genia Harold, MD  sucralfate (CARAFATE) 1 g tablet Take 1 tablet (1 g total) by mouth 4 (four) times daily -  with meals and at bedtime. 01/28/21   Saguier, Percell Miller, PA-C      Allergies    Patient has no known allergies.    Review of Systems   Review of Systems 10 systems reviewed negative except as per HPI Physical Exam Updated Vital Signs BP 118/78 (BP Location: Left Arm)     Pulse 79    Temp 98.9 F (37.2 C) (Oral)    Resp 20    Ht 5\' 10"  (1.778 m)    Wt 104.8 kg    SpO2 96%    BMI 33.15 kg/m  Physical Exam Constitutional:      Comments: Alert nontoxic.  No respiratory distress at rest.  HENT:     Head: Normocephalic and atraumatic.     Mouth/Throat:     Pharynx: Oropharynx is clear.  Eyes:     Extraocular Movements: Extraocular movements intact.  Cardiovascular:     Rate and Rhythm: Normal rate and regular rhythm.  Pulmonary:     Effort: Pulmonary effort is normal.     Breath sounds: Normal breath sounds.  Abdominal:     General: There is no distension.     Palpations: Abdomen is soft.     Tenderness: There is no abdominal tenderness. There is no guarding.  Musculoskeletal:        General: No swelling or tenderness. Normal range of motion.     Cervical back: Neck supple.     Right lower leg: No edema.     Left lower leg: No edema.  Skin:    General: Skin is warm and dry.  Neurological:     General: No focal deficit present.     Mental Status: He is oriented to person, place, and time.     Motor: No weakness.     Coordination: Coordination normal.  Psychiatric:        Mood and Affect: Mood normal.    ED Results / Procedures / Treatments   Labs (all labs ordered are listed, but only abnormal results are displayed) Labs Reviewed  BASIC METABOLIC PANEL - Abnormal; Notable for the following components:      Result Value   Glucose, Bld 109 (*)    All other components within normal limits  CBC  TROPONIN I (HIGH SENSITIVITY)    EKG EKG Interpretation  Date/Time:  Friday February 11 2021 10:02:55 EST Ventricular Rate:  90 PR Interval:  173 QRS Duration: 100 QT Interval:  344 QTC Calculation: 421 R Axis:   80 Text Interpretation: Sinus rhythm Probable inferior infarct, age indeterminate Although rate has decreased Since last tracing rate slower Confirmed by Pattricia Boss [phone]) on 02/12/2021 11:35:20 AM  Radiology DG Chest 2  View  Result Date: 02/11/2021 CLINICAL DATA:  Chest pain, shortness of breath EXAM: CHEST - 2 VIEW COMPARISON:  11/19/2018 FINDINGS: The heart size and mediastinal contours are within normal limits. Both lungs are clear. The visualized skeletal structures are unremarkable. IMPRESSION: No active cardiopulmonary disease. Electronically Signed   By: Davina Poke D.O.   On: 02/11/2021 10:48   CT Angio Chest PE W/Cm &/Or Wo Cm  Result Date: 02/11/2021 CLINICAL DATA:  Congestion, chest tightness, palpitations EXAM: CT ANGIOGRAPHY CHEST WITH CONTRAST TECHNIQUE: Multidetector CT imaging of the chest was performed using the standard protocol during bolus administration of intravenous contrast. Multiplanar CT image reconstructions and MIPs were obtained to evaluate the vascular anatomy. RADIATION DOSE REDUCTION: This exam was performed according to the departmental dose-optimization program which includes automated exposure control, adjustment of the mA and/or kV according to patient size and/or use of iterative reconstruction technique. CONTRAST:  169mL OMNIPAQUE IOHEXOL 350 MG/ML SOLN COMPARISON:  None. FINDINGS: Cardiovascular: Satisfactory opacification of the pulmonary arteries to the segmental level. No evidence of pulmonary embolism. Normal heart size. No pericardial effusion. Mediastinum/Nodes: No enlarged mediastinal, hilar, or axillary lymph nodes. Thyroid gland, trachea, and esophagus demonstrate no significant findings. Lungs/Pleura: Lungs are clear. No pleural effusion or pneumothorax. Upper Abdomen: No acute abnormality. Musculoskeletal: No chest wall abnormality. No acute or significant osseous findings. Review of the MIP images confirms the above findings. IMPRESSION: 1. No evidence of pulmonary embolus. Electronically Signed   By: Kathreen Devoid M.D.   On: 02/11/2021 14:01   MR Brain Wo Contrast  Result Date: 02/10/2021 CLINICAL DATA:  Paresthesia. Arm heaviness. Numbness and tingling in left arm.  Additional history provided: Neuro deficit, acute, stroke suspected. Patient had paresthesias in left upper extremity and arm heaviness. EXAM: MRI HEAD WITHOUT CONTRAST TECHNIQUE: Multiplanar, multiecho pulse sequences of the brain and surrounding structures were obtained without intravenous contrast. COMPARISON:  Noncontrast head CT and CT angiogram head/neck 02/01/2021. Brain MRI 09/08/2020. Neck CT 01/18/2021. FINDINGS: Brain: Cerebral volume is normal. No cortical encephalomalacia is identified. No significant cerebral white matter disease. There is no acute infarct. No evidence of an intracranial mass. No chronic intracranial blood products. No extra-axial fluid collection. No midline shift. Vascular: Maintained flow voids within the proximal large arterial vessels. Skull and upper cervical spine: No focal suspicious marrow lesion. Sinuses/Orbits: Visualized orbits show no acute finding. Small fluid levels within the bilateral sphenoid sinuses. Trace mucosal thickening within the bilateral ethmoid sinuses. Other: Redemonstrated asymmetric prominence of the left palatine tonsil (series 12, image 16). IMPRESSION: Unremarkable non-contrast MRI appearance of the brain. No evidence of acute intracranial abnormality. Bilateral ethmoid and sphenoid sinusitis, as described. Redemonstrated asymmetric prominence of the left palatine tonsil, more fully assessed on the recent prior neck CT of 01/18/2021. This may reflect asymmetric tonsillar inflammation or a mucosal neoplasm, and direct visualization is recommended. Electronically Signed   By: Kellie Simmering D.O.   On: 02/10/2021 17:49    Procedures Procedures    Medications Ordered in ED Medications  iohexol (OMNIPAQUE) 350 MG/ML injection 100 mL (100 mLs Intravenous Contrast Given 02/11/21 1341)    ED Course/ Medical Decision Making/ A&P                           Medical Decision Making  EMR extensively reviewed for prior medical history and diagnostic  work-up and consultations.  Patient had fairly recent episode of complicated pharyngitis/mild epiglottitis with antibiotic (clindamycin and nystatin) and steroid treatment that was able to be managed on outpatient basis 12/20.  More recently now patient is having central chest discomfort.  Also some discomfort with lying flat.  Occasional cough with perception of chest congestion.  No fevers.  Has not for perception of shortness of breath.  Due to constellation of symptoms plus significant risk factors of diabetes and asthma, differential diagnosis includes ACS\indolent infection possible pericarditis\myocarditis\mediastinitis\empyema.  With associated shortness of breath also consideration  for PE.  Diagnostic evaluation initiated including troponin, EKG, chest x-ray and CT PE study.  All studies and results reviewed by myself independently as well.  Upon completion of diagnostic evaluation, no concerning findings to suggest PE, pneumonia, ongoing significant thoracic infection.  At this time stable for discharge.  We discussed potential ongoing bronchitis.  Patient has inhalers at home to use.  No hypoxia.  Recommendations for close follow-up with PCP given risk factors and need for reassessment.  Careful return precautions reviewed.  Patient's mother is at bedside.  All questions answered and all diagnostic results and medical decision making reviewed with patient and his mother. Final Clinical Impression(s) / ED Diagnoses Final diagnoses:  Subacute cough  Bronchitis  Chest pain, unspecified type  Shortness of breath    Rx / DC Orders ED Discharge Orders     None         Charlesetta Shanks, MD 02/12/21 1325

## 2021-02-12 ENCOUNTER — Other Ambulatory Visit: Payer: Self-pay | Admitting: Medical

## 2021-02-15 ENCOUNTER — Ambulatory Visit (INDEPENDENT_AMBULATORY_CARE_PROVIDER_SITE_OTHER): Payer: BC Managed Care – PPO | Admitting: Medical

## 2021-02-15 VITALS — BP 120/74 | HR 79 | Temp 98.2°F | Resp 18 | Ht 70.0 in | Wt 229.4 lb

## 2021-02-15 DIAGNOSIS — J0120 Acute ethmoidal sinusitis, unspecified: Secondary | ICD-10-CM | POA: Diagnosis not present

## 2021-02-15 DIAGNOSIS — R0789 Other chest pain: Secondary | ICD-10-CM

## 2021-02-15 DIAGNOSIS — J4520 Mild intermittent asthma, uncomplicated: Secondary | ICD-10-CM | POA: Diagnosis not present

## 2021-02-15 DIAGNOSIS — R059 Cough, unspecified: Secondary | ICD-10-CM

## 2021-02-15 DIAGNOSIS — K219 Gastro-esophageal reflux disease without esophagitis: Secondary | ICD-10-CM

## 2021-02-15 LAB — TROPONIN I (HIGH SENSITIVITY): High Sens Troponin I: 4 ng/L (ref 2–17)

## 2021-02-15 MED ORDER — BUDESONIDE-FORMOTEROL FUMARATE 160-4.5 MCG/ACT IN AERO: 2.0000 | INHALATION_SPRAY | Freq: Two times a day (BID) | RESPIRATORY_TRACT | 12 refills | Status: DC

## 2021-02-15 MED ORDER — DOXYCYCLINE HYCLATE 100 MG PO TABS: 100.0000 mg | ORAL_TABLET | Freq: Two times a day (BID) | ORAL | 0 refills | Status: DC

## 2021-02-15 NOTE — Patient Instructions (Addendum)
Atypical chest pain recently and evaluated emergency department.  CT angio of the chest did not show PE or pneumonia.  Troponin was negative and EKG did not show acute ischemia. Some risk factors and that you are diabetic and have high cholesterol.  I decided best to refer you to cardiology for further work-up if indicated.  For caution sake we will get troponin stat today.  If that level is elevated then we will have to advise emergency department evaluation.  EKG today showed normal sinus rhythm with some artifact.  Recent MRI showed possible ethmoid sinus infection.  Will prescribe doxycycline antibiotic.  Rx advisement given.  Explained in detail to avoid GI stomach upset in light of GERD.  Cough intermittent since early January.  Considering GERD versus asthma cause presently.  Advising continue Protonix.  Advising increasing famotidine to twice daily.  Prescribe Symbicort to use 2 inhalations twice daily.  Can use albuterol as needed.  If Symbicort covered then discontinue Flovent.  Placing referral to pulmonologist.  Also you have upcoming appointment with GI MD and will eventually get EGD.  Follow-up in 2 weeks or sooner if needed.

## 2021-02-15 NOTE — Progress Notes (Signed)
Subjective:    Patient ID: Anthony Russell, male    DOB: [birth year], 41 y.o.   MRN: [id number]  HPI Pt in states he is feeling some possible chest congestion type feeling middle of his chest. He described atypical chest pain on my chart message on Friday so felt obligated to advise conservative measures rather than have him not evaluate and wait 3 days with chest discomfort.   ED evaluation Arrival date & time: 02/11/21   "Patient reports he is having persisting chest pain in his central chest that is waxing and waning.  He reports sometimes he perceives associated congestion and coughs a small amount.  However, he denies he had any fever chills or body aches he reports it does feel more painful when he is either lying flat or leaning forward.  He also notes that he slightly more short of breath.  Patient denies lower extremity swelling or calf pain.  He did have recent severe pharyngitis requiring steroids and antibiotics.   Ekg showed. Machine read Text Interpretation:      Sinus rhythm Probable inferior infarct, age indeterminate Although rate has decreased Since last tracing rate slower Confirmed by Pattricia Boss [phone]).  Medical Decision Making   EMR extensively reviewed for prior medical history and diagnostic work-up and consultations.   Patient had fairly recent episode of complicated pharyngitis/mild epiglottitis with antibiotic (clindamycin and nystatin) and steroid treatment that was able to be managed on outpatient basis 12/20.  More recently now patient is having central chest discomfort.  Also some discomfort with lying flat.  Occasional cough with perception of chest congestion.  No fevers.  Has not for perception of shortness of breath.   Due to constellation of symptoms plus significant risk factors of diabetes and asthma, differential diagnosis includes ACS\indolent infection possible pericarditis\myocarditis\mediastinitis\empyema.  With associated shortness of breath also  consideration for PE.  Diagnostic evaluation initiated including troponin, EKG, chest x-ray and CT PE study.  All studies and results reviewed by myself independently as well.   Upon completion of diagnostic evaluation, no concerning findings to suggest PE, pneumonia, ongoing significant thoracic infection.  At this time stable for discharge.  We discussed potential ongoing bronchitis.  Patient has inhalers at home to use.  No hypoxia.  Recommendations for close follow-up with PCP given risk factors and need for reassessment.  Careful return precautions reviewed."   On review pt had negative ct chest angio, cxr and negative troponin.   ED had thought that some of symptoms could be reflux. ED told patient maybe bronchitis. He feels mild shortness. Pt has been using flovent and albuterol recently he. He fells like it is helping but not much.   For gerd. Pt is on protoinx and famotadine. Pt has upcoming appt with GI MD sometimes in next week. Mar 09, 2021 will get egd. In the past I had rx'd carafate briefly.    On recent mri done for recent ha showed possible sinusitis. Pt notes on last visit with me some faint sinus pressure.     Review of Systems  Constitutional:  Negative for chills, fatigue and fever.  HENT:  Negative for congestion and dental problem.   Respiratory:  Positive for cough. Negative for chest tightness, shortness of breath and wheezing.        Rare sporacid cough.   Cardiovascular:  Positive for chest pain. Negative for palpitations.       Atypical chest disomfort.   Gastrointestinal:  Negative for abdominal pain, blood in  stool, diarrhea and vomiting.       Jerrye Bushy.   Genitourinary:  Negative for dysuria, flank pain, frequency and penile pain.  Musculoskeletal:  Negative for back pain, joint swelling and myalgias.  Skin:  Negative for rash.    Past Medical History:  Diagnosis Date   Allergy    Allergy, unspecified not elsewhere classified    rx w/ OTC antihistamines  PRN   Anxiety    Asthma    Atypical chest pain    recent neg eval w/ normal cxr/ekg   Depression    DM (diabetes mellitus) ([place])    Dyspepsia    on protonix 40mg /d   GERD (gastroesophageal reflux disease)    on protonix 40mg /d   HTN (hypertension)    Hyperlipidemia    on diet alone   IBS (irritable bowel syndrome)    Lumbar back pain    s/p lumbar laminectomy 2007 by DrCabbell   Migraines    Overweight(278.02)    weight Jan10=246#.[name].he was 225# in 9/07...diet and exercise was discussed   Sleep apnea      Social History   Socioeconomic History   Marital status: Single    Spouse name: Not on file   Number of children: 0   Years of education: Not on file   Highest education level: Not on file  Occupational History   Occupation: Gaffer  Tobacco Use   Smoking status: Former    Packs/day: 0.10    Years: 8.00    Pack years: 0.80    Types: Cigarettes    Quit date: 03/03/2011    Years since quitting: 9.9   Smokeless tobacco: Never  Vaping Use   Vaping Use: Never used  Substance and Sexual Activity   Alcohol use: Yes    Comment: once or twice a month   Drug use: No   Sexual activity: Not on file  Other Topics Concern   Not on file  Social History Narrative   3 cups caffeine a day   Social Determinants of Health   Financial Resource Strain: Not on file  Food Insecurity: Not on file  Transportation Needs: Not on file  Physical Activity: Not on file  Stress: Not on file  Social Connections: Not on file  Intimate Partner Violence: Not on file    Past Surgical History:  Procedure Laterality Date   DENTAL SURGERY     LUMBAR LAMINECTOMY  01/30/2005   DrCabbell   TEAR DUCT PROBING      Family History  Problem Relation Age of Onset   Allergies Mother    Irritable bowel syndrome Mother    Diabetes Mother    Allergies Father    Diabetes Father    Hyperlipidemia Father    Irritable bowel syndrome Brother    Heart disease Brother    Allergies  Brother    Breast cancer Maternal Aunt    Colon cancer Neg Hx     No Known Allergies  Current Outpatient Medications on File Prior to Visit  Medication Sig Dispense Refill   albuterol (VENTOLIN HFA) 108 (90 Base) MCG/ACT inhaler INHALE 2 PUFFS INTO THE LUNGS EVERY 6 HOURS AS NEEDED FOR WHEEZING OR SHORTNESS OF BREATH 20.1 g 0   atorvastatin (LIPITOR) 10 MG tablet TAKE 1 TABLET(10 MG) BY MOUTH DAILY 90 tablet 3   calcium carbonate (OS-CAL) 1250 (500 Ca) MG chewable tablet Chew 2 tablets by mouth daily.     cefdinir (OMNICEF) 125 MG/5ML suspension Take 12 mLs (300 mg  total) by mouth 2 (two) times daily. 240 mL 0   clindamycin (CLEOCIN) 150 MG capsule Take 1 capsule (150 mg total) by mouth 3 (three) times daily. 21 capsule 0   dapagliflozin propanediol (FARXIGA) 5 MG TABS tablet Take 1 tablet (5 mg total) by mouth daily. 30 tablet 3   famotidine (PEPCID) 20 MG tablet Take 1 tablet (20 mg total) by mouth at bedtime. 30 tablet 3   FLOVENT HFA 110 MCG/ACT inhaler INHALE 2 PUFFS INTO THE LUNGS TWICE DAILY 12 g 0   glycopyrrolate (ROBINUL) 2 MG tablet Take 1 tablet (2 mg total) by mouth 2 (two) times daily as needed (Abdominal disccomfort). 60 tablet 2   indomethacin (INDOCIN) 25 MG capsule Take 1 capsule (25 mg total) by mouth 3 (three) times daily with meals. For five days. 15 capsule 2   L-Arginine 500 MG CAPS Take 2 capsules by mouth 2 (two) times daily.     levocetirizine (XYZAL) 5 MG tablet Take 1 tablet (5 mg total) by mouth every evening. 30 tablet 3   lidocaine (XYLOCAINE) 2 % solution 5 mL swish and swallow every 4 hours as needed for throat pain 100 mL 0   metFORMIN (GLUCOPHAGE) 1000 MG tablet TAKE 1 TABLET(1000 MG) BY MOUTH TWICE DAILY WITH A MEAL 180 tablet 1   Multiple Vitamin (MULTIVITAMIN ADULT PO) Take 2 tablets by mouth daily.     Nutritional Supplements (PYCNOGENOL) 300-30 MG CAPS Take 1 capsule by mouth 3 (three) times daily.     nystatin (MYCOSTATIN) 100000 UNIT/ML suspension  Take 5 mLs (500,000 Units total) by mouth 4 (four) times daily. 60 mL 0   pantoprazole (PROTONIX) 40 MG tablet Take 1 tablet (40 mg total) by mouth 2 (two) times daily before a meal. 60 tablet 6   prednisoLONE (PRELONE) 15 MG/5ML SOLN Take 10 mLs (30 mg total) by mouth 2 (two) times daily. 200 mL 0   propranolol (INDERAL) 20 MG tablet Take 1 tablet (20 mg total) by mouth 2 (two) times daily. 60 tablet 3   sucralfate (CARAFATE) 1 g tablet Take 1 tablet (1 g total) by mouth 4 (four) times daily -  with meals and at bedtime. 120 tablet 0   No current facility-administered medications on file prior to visit.    BP 120/74    Pulse 79    Temp 98.2 F (36.8 C)    Resp 18    Ht 5\' 10"  (1.778 m)    Wt 229 lb 6.4 oz (104.1 kg)    SpO2 97%    BMI 32.92 kg/m       Objective:   Physical Exam  General Mental Status- Alert. General Appearance- Not in acute distress.   Skin General: Color- Normal Color. Moisture- Normal Moisture.  Neck Carotid Arteries- Normal color. Moisture- Normal Moisture. No carotid bruits. No JVD.  Chest and Lung Exam Auscultation: Breath Sounds:-Normal.  Cardiovascular Auscultation:Rythm- Regular. Murmurs & Other Heart Sounds:Auscultation of the heart reveals- No Murmurs.  Abdomen Inspection:-Inspeection Normal. Palpation/Percussion:Note:No mass. Palpation and Percussion of the abdomen reveal- Non Tender, Non Distended + BS, no rebound or guarding.    Neurologic Cranial Nerve exam:- CN III-XII intact(No nystagmus), symmetric smile. Drift Test:- No drift. Finger to Nose:- Normal/Intact Strength:- 5/5 equal and symmetric strength both upper and lower extremities.   Lower ext- no pedal edema. Negative homans signs.     Assessment & Plan:   Patient Instructions  Atypical chest pain recently and evaluated emergency department.  CT angio  of the chest did not show PE or pneumonia.  Troponin was negative and EKG did not show acute ischemia. Some risk factors and  that you are diabetic and have high cholesterol.  I decided best to refer you to cardiology for further work-up if indicated.  For caution sake we will get troponin stat today.  If that level is elevated then we will have to advise emergency department evaluation.  EKG today showed normal sinus rhythm with some artifact.  Recent MRI showed possible ethmoid sinus infection.  Will prescribe doxycycline antibiotic.  Rx advisement given.  Explained in detail to avoid GI stomach upset in light of GERD.  Cough intermittent since early January.  Considering GERD versus asthma cause presently.  Advising continue Protonix.  Advising increasing famotidine to twice daily.  Prescribe Symbicort to use 2 inhalations twice daily.  Can use albuterol as needed.  If Symbicort covered then discontinue Flovent.  Placing referral to pulmonologist.  Also you have upcoming appointment with GI MD and will eventually get EGD.  Follow-up in 2 weeks or sooner if needed.   Mackie Pai, PA-C

## 2021-02-17 DIAGNOSIS — J342 Deviated nasal septum: Secondary | ICD-10-CM | POA: Diagnosis not present

## 2021-02-17 DIAGNOSIS — J358 Other chronic diseases of tonsils and adenoids: Secondary | ICD-10-CM | POA: Diagnosis not present

## 2021-02-17 DIAGNOSIS — J040 Acute laryngitis: Secondary | ICD-10-CM | POA: Diagnosis not present

## 2021-02-23 ENCOUNTER — Ambulatory Visit (AMBULATORY_SURGERY_CENTER): Payer: Self-pay

## 2021-02-23 ENCOUNTER — Other Ambulatory Visit: Payer: Self-pay

## 2021-02-23 VITALS — Ht 68.5 in | Wt 229.0 lb

## 2021-02-23 DIAGNOSIS — M545 Low back pain, unspecified: Secondary | ICD-10-CM | POA: Insufficient documentation

## 2021-02-23 DIAGNOSIS — R1013 Epigastric pain: Secondary | ICD-10-CM | POA: Insufficient documentation

## 2021-02-23 DIAGNOSIS — G43909 Migraine, unspecified, not intractable, without status migrainosus: Secondary | ICD-10-CM | POA: Insufficient documentation

## 2021-02-23 DIAGNOSIS — G4730 Sleep apnea, unspecified: Secondary | ICD-10-CM | POA: Insufficient documentation

## 2021-02-23 DIAGNOSIS — I10 Essential (primary) hypertension: Secondary | ICD-10-CM | POA: Insufficient documentation

## 2021-02-23 DIAGNOSIS — E119 Type 2 diabetes mellitus without complications: Secondary | ICD-10-CM | POA: Insufficient documentation

## 2021-02-23 DIAGNOSIS — K219 Gastro-esophageal reflux disease without esophagitis: Secondary | ICD-10-CM

## 2021-02-23 NOTE — Progress Notes (Signed)
Denies allergies to eggs or soy products. Denies complication of anesthesia or sedation. Denies use of weight loss medication. Denies use of O2.   Emmi instructions given for colonoscopy.  

## 2021-03-01 ENCOUNTER — Other Ambulatory Visit: Payer: Self-pay

## 2021-03-01 ENCOUNTER — Encounter: Payer: Self-pay | Admitting: Cardiology

## 2021-03-01 ENCOUNTER — Ambulatory Visit (INDEPENDENT_AMBULATORY_CARE_PROVIDER_SITE_OTHER): Payer: BC Managed Care – PPO | Admitting: Cardiology

## 2021-03-01 VITALS — BP 114/80 | HR 94 | Ht 70.0 in | Wt 226.0 lb

## 2021-03-01 DIAGNOSIS — I10 Essential (primary) hypertension: Secondary | ICD-10-CM | POA: Diagnosis not present

## 2021-03-01 DIAGNOSIS — R0789 Other chest pain: Secondary | ICD-10-CM

## 2021-03-01 DIAGNOSIS — R079 Chest pain, unspecified: Secondary | ICD-10-CM

## 2021-03-01 MED ORDER — METOPROLOL TARTRATE 100 MG PO TABS: 100.0000 mg | ORAL_TABLET | Freq: Once | ORAL | 0 refills | Status: DC

## 2021-03-01 NOTE — Progress Notes (Signed)
Cardiology Consultation:    Date:  03/01/2021   ID:  Anthony Russell, DOB 16-Apr-1980, MRN [id number]  PCP:  Esperanza Richters, PA-C  Cardiologist:  Gypsy Balsam, MD   Referring MD: Marisue Brooklyn   No chief complaint on file. I have a chest pain  History of Present Illness:    Anthony Russell is a 41 y.o. male who is being seen today for the evaluation of chest pain at the request of [place], Ramon Dredge, [place].  Past medical history significant for essential hypertension, diabetes, dyslipidemia, ex-smoker he was referred to [place] because of episode of chest pain.  He described pain located in the epigastrium with radiation towards the chest pain happen typically when he sits and when he lays down.  There is no relation between food interestingly walking make this pain better.  He did have troponin I done which was negative, he also had chest CT to rule out PE and that was negative.  He was given some proton pump inhibitor with some relief but still having symptoms he is scheduled to have gastroscopy within the next few days which I think is an excellent idea. He does not smoke used to but quit years ago He does have dyslipidemia he is on a statin Does have diabetes with fair control. He does have family history of premature coronary artery disease but his roommate had a heart attack recently and that scares him a lot that is why he want to be seen  Past Medical History:  Diagnosis Date   Allergy    Allergy, unspecified not elsewhere classified    rx w/ OTC antihistamines PRN   Anxiety    Asthma    Atypical chest pain    recent neg eval w/ normal cxr/ekg   Depression    DM (diabetes mellitus) (HCC)    Dyspepsia    on protonix 40mg /d   GERD (gastroesophageal reflux disease)    on protonix 40mg /d   HTN (hypertension)    Hyperlipidemia    on diet alone   IBS (irritable bowel syndrome)    Lumbar back pain    s/p lumbar laminectomy 2007 by DrCabbell   Migraines     Overweight(278.02)    weight Jan10=246#. .he was 225# in 9/07...diet and exercise was discussed   Sleep apnea     Past Surgical History:  Procedure Laterality Date   DENTAL SURGERY     LUMBAR LAMINECTOMY  01/30/2005   DrCabbell   TEAR DUCT PROBING      Current Medications: Current Meds  Medication Sig   albuterol (VENTOLIN HFA) 108 (90 Base) MCG/ACT inhaler INHALE 2 PUFFS INTO THE LUNGS EVERY 6 HOURS AS NEEDED FOR WHEEZING OR SHORTNESS OF BREATH   atorvastatin (LIPITOR) 10 MG tablet TAKE 1 TABLET(10 MG) BY MOUTH DAILY   budesonide-formoterol (SYMBICORT) 160-4.5 MCG/ACT inhaler Inhale 2 puffs into the lungs 2 (two) times daily.   calcium carbonate (OS-CAL) 1250 (500 Ca) MG chewable tablet Chew 2 tablets by mouth daily.   dapagliflozin propanediol (FARXIGA) 5 MG TABS tablet Take 1 tablet (5 mg total) by mouth daily.   famotidine (PEPCID) 20 MG tablet Take 1 tablet (20 mg total) by mouth at bedtime.   glycopyrrolate (ROBINUL) 2 MG tablet Take 1 tablet (2 mg total) by mouth 2 (two) times daily as needed (Abdominal disccomfort).   L-Arginine 500 MG CAPS Take 2 capsules by mouth 2 (two) times daily.   levocetirizine (XYZAL) 5 MG tablet Take 1 tablet (5 mg  total) by mouth every evening.   metFORMIN (GLUCOPHAGE) 1000 MG tablet TAKE 1 TABLET(1000 MG) BY MOUTH TWICE DAILY WITH A MEAL   Multiple Vitamin (MULTIVITAMIN ADULT PO) Take 2 tablets by mouth daily.   Nutritional Supplements (PYCNOGENOL) 300-30 MG CAPS Take 1 capsule by mouth 3 (three) times daily.   pantoprazole (PROTONIX) 40 MG tablet Take 1 tablet (40 mg total) by mouth 2 (two) times daily before a meal.   propranolol (INDERAL) 20 MG tablet Take 1 tablet (20 mg total) by mouth 2 (two) times daily.     Allergies:   Patient has no known allergies.   Social History   Socioeconomic History   Marital status: Single    Spouse name: Not on file   Number of children: 0   Years of education: Not on file   Highest education level: Not  on file  Occupational History   Occupation: Systems analystoftware Developer  Tobacco Use   Smoking status: Former    Packs/day: 0.10    Years: 8.00    Pack years: 0.80    Types: Cigarettes    Quit date: 03/03/2011    Years since quitting: 10.0   Smokeless tobacco: Never  Vaping Use   Vaping Use: Never used  Substance and Sexual Activity   Alcohol use: Yes    Comment: once or twice a month   Drug use: No   Sexual activity: Not on file  Other Topics Concern   Not on file  Social History Narrative   3 cups caffeine a day   Social Determinants of Health   Financial Resource Strain: Not on file  Food Insecurity: Not on file  Transportation Needs: Not on file  Physical Activity: Not on file  Stress: Not on file  Social Connections: Not on file     Family History: The patient's family history includes Allergies in his brother, father, and mother; Breast cancer in his maternal aunt; Diabetes in his father and mother; Heart disease in his brother; Hyperlipidemia in his father; Irritable bowel syndrome in his brother and mother. There is no history of Colon cancer, Esophageal cancer, Rectal cancer, or Stomach cancer. ROS:   Please see the history of present illness.    All 14 point review of systems negative except as described per history of present illness.  EKGs/Labs/Other Studies Reviewed:    The following studies were reviewed today:   EKG:  EKG is  ordered today.  The ekg ordered today demonstrates normal sinus rhythm, normal P interval, normal QS complex duration morphology nonspecific ST segment changes  Recent Labs: 02/01/2021: ALT 36 02/11/2021: BUN 16; Creatinine, Ser 1.05; Hemoglobin 14.7; Platelets 186; Potassium 3.9; Sodium 143  Recent Lipid Panel    Component Value Date/Time   CHOL 174 04/29/2020 0838   TRIG 190.0 (H) 04/29/2020 0838   HDL 30.50 (L) 04/29/2020 0838   CHOLHDL 6 04/29/2020 0838   VLDL 38.0 04/29/2020 0838   LDLCALC 106 (H) 04/29/2020 0838   LDLDIRECT 132.0  08/14/2019 1103    Physical Exam:    VS:  BP 114/80    Pulse 94    Ht 5\' 10"  (1.778 m)    Wt 226 lb (102.5 kg)    SpO2 96%    BMI 32.43 kg/m     Wt Readings from Last 3 Encounters:  03/01/21 226 lb (102.5 kg)  02/23/21 229 lb (103.9 kg)  02/15/21 229 lb 6.4 oz (104.1 kg)     GEN:  Well nourished, well developed  in no acute distress HEENT: Normal NECK: No JVD; No carotid bruits LYMPHATICS: No lymphadenopathy CARDIAC: RRR, no murmurs, no rubs, no gallops RESPIRATORY:  Clear to auscultation without rales, wheezing or rhonchi  ABDOMEN: Soft, non-tender, non-distended MUSCULOSKELETAL:  No edema; No deformity  SKIN: Warm and dry NEUROLOGIC:  Alert and oriented x 3 PSYCHIATRIC:  Normal affect   ASSESSMENT:    1. Atypical chest pain   2. Primary hypertension   3. Chest pain, unspecified type    PLAN:    In order of problems listed above:  Atypical chest pain however patient multiple risk factors for coronary artery disease description of the pain he gave me look like more GI on top of that he is a little tender in epigastrium I think doing gastroscopy is an excellent idea however because of his multiple risk factors for coronary artery disease I think it would be reasonable to evaluate him for presence of coronary artery disease I think the best way to do it would be to have coronary CT angio.  I will schedule him for test test was explained including all risk benefits as well as alternatives. Essential hypertension blood per seems to be well controlled, continue present management. Dyslipidemia his LDL is still elevated however await for results of coronary CT angio before augmenting therapy for it. Overall I have relatively low level of suspicion for his symptoms to be related to his heart however because of multiple risk factors evaluation is needed.  Medication Adjustments/Labs and Tests Ordered: Current medicines are reviewed at length with the patient today.  Concerns  regarding medicines are outlined above.  No orders of the defined types were placed in this encounter.  No orders of the defined types were placed in this encounter.   Signed, Georgeanna Lea, MD, [facility]. 03/01/2021 4:35 PM    [facility]

## 2021-03-01 NOTE — Patient Instructions (Signed)
Medication Instructions:  Your physician recommends that you continue on your current medications as directed. Please refer to the Current Medication list given to you today.  *If you need a refill on your cardiac medications before your next appointment, please call your pharmacy*   Lab Work:  BMP 1 week before CT  If you have labs (blood work) drawn today and your tests are completely normal, you will receive your results only by: MyChart Message (if you have MyChart) OR A paper copy in the mail If you have any lab test that is abnormal or we need to change your treatment, we will call you to review the results.   Testing/Procedures:   Your cardiac CT will be scheduled at one of the below locations:   [facility] [address] [place], [place] [address] [phone]  OR  [facility] [address] [place], [place] [address] [phone]  If scheduled at [facility], please arrive at the [facility] main entrance (entrance A) of [facility] 30 minutes prior to test start time. You can use the FREE valet parking offered at the main entrance (encouraged to control the heart rate for the test) Proceed to the [facility] Radiology Department (first floor) to check-in and test prep.  If scheduled at [facility], please arrive 15 mins early for check-in and test prep.  Please follow these instructions carefully (unless otherwise directed):  Hold all erectile dysfunction medications at least 3 days (72 hrs) prior to test.  On the Night Before the Test: Be sure to Drink plenty of water. Do not consume any caffeinated/decaffeinated beverages or chocolate 12 hours prior to your test. Do not take any antihistamines 12 hours prior to your test.  On the Day of the Test: Drink plenty of water until 1 hour prior to the test. Do not eat any food 4 hours prior to the test. You may  take your regular medications prior to the test.  Take metoprolol (Lopressor) two hours prior to test.      After the Test: Drink plenty of water. After receiving IV contrast, you may experience a mild flushed feeling. This is normal. On occasion, you may experience a mild rash up to 24 hours after the test. This is not dangerous. If this occurs, you can take Benadryl 25 mg and increase your fluid intake. If you experience trouble breathing, this can be serious. If it is severe call 911 IMMEDIATELY. If it is mild, please call our office. If you take any of these medications: Glipizide/Metformin, Avandament, Glucavance, please do not take 48 hours after completing test unless otherwise instructed.  We will call to schedule your test 2-4 weeks out understanding that some insurance companies will need an authorization prior to the service being performed.   For non-scheduling related questions, please contact the cardiac imaging nurse navigator should you have any questions/concerns: Rockwell Alexandria, Cardiac Imaging Nurse Navigator Larey Brick, Cardiac Imaging Nurse Navigator [facility] Direct Office Dial: [phone]   For scheduling needs, including cancellations and rescheduling, please call [place], [phone].    Follow-Up: At [facility], you and your health needs are our priority.  As part of our continuing mission to provide you with exceptional heart care, we have created designated Provider Care Teams.  These Care Teams include your primary Cardiologist (physician) and Advanced Practice Providers (APPs -  Physician Assistants and Nurse Practitioners) who all work together  to provide you with the care you need, when you need it.  We recommend signing up for the patient portal called "MyChart".  Sign up information is provided on this After Visit Summary.  MyChart is used to connect with patients for Virtual Visits (Telemedicine).  Patients are able to  view lab/test results, encounter notes, upcoming appointments, etc.  Non-urgent messages can be sent to your provider as well.   To learn more about what you can do with MyChart, go to [email or web address].    Your next appointment:   3 month(s)  The format for your next appointment:   In Person  Provider:   Gypsy Balsam, MD    Other Instructions None

## 2021-03-03 ENCOUNTER — Telehealth: Payer: Self-pay | Admitting: Physician Assistant

## 2021-03-03 NOTE — Telephone Encounter (Signed)
He has cardiology. He is scheduled for a coronary CT angio.  EGD is 03/09/21. Do you want to push the date out until he is cleared by cardiology, ask cardiology for clearance or go forward as scheduled? Thanks [employer]

## 2021-03-03 NOTE — Telephone Encounter (Signed)
Mr Baumler is on the [facility] schedule for an EGD procedure next week Wed 03-09-21

## 2021-03-04 ENCOUNTER — Telehealth: Payer: Self-pay

## 2021-03-04 ENCOUNTER — Other Ambulatory Visit: Payer: Self-pay

## 2021-03-04 MED ORDER — DEXLANSOPRAZOLE 60 MG PO CPDR: 60.0000 mg | DELAYED_RELEASE_CAPSULE | Freq: Every day | ORAL | 3 refills | Status: DC

## 2021-03-04 MED ORDER — SUCRALFATE 1 GM/10ML PO SUSP: 1.0000 g | Freq: Three times a day (TID) | ORAL | 3 refills | Status: DC

## 2021-03-04 NOTE — Telephone Encounter (Signed)
Patient send the following message.  They changed my appointment for the Endoscopy and I am really having a tough time as a result of this chest pressure and shortness of breath, which the ER, my primary doctor, and my cardiologist all say is likely related to my acid reflux.  They moved the Endoscopy which you already had scheduled for the abdomen pain and I was hoping that would see something for this, too.  I cannot deal with this until the end of March.  Can you give me a different medicine to try for my acid reflux or do you need to see me first?  I need to do something different, this is not going away on its own or getting better.   I asked him how he is taking his medications. He is taking them as prescribed. Pantoprazole and famotidine BID. He is occasionally taking TUMS.

## 2021-03-04 NOTE — Telephone Encounter (Signed)
Information to the patient through My Chart. Rx's to the [facility] in [place].

## 2021-03-04 NOTE — Telephone Encounter (Signed)
Pre-operative Risk Assessment     Request for surgical clearance:     Endoscopy Procedure  What type of surgery is being performed?     EGD  When is this surgery scheduled?     TBA-03/09/21 cancelled pending cardiology clearance  What type of clearance is required ?  Medical  Are there any medications that need to be held prior to surgery and how long? no  Practice name and name of physician performing surgery?     Dayna Barker, MD at [facility]  What is your office phone and fax number?      Phone- [phone]  [phone]  Anesthesia type (None, local, MAC, general) ?       MAC

## 2021-03-04 NOTE — Telephone Encounter (Signed)
I have advised the patient. He is not happy about it, but I explained to him it was for his safety. A message has been sent to Cardiology asking for medical clearance.  The procedure (EGD) is rescheduled to 04/26/21. I will follow up and move his procedure to a sooner date if possible once we have cardiology clearance.

## 2021-03-09 ENCOUNTER — Encounter: Payer: BC Managed Care – PPO | Admitting: Internal Medicine

## 2021-03-09 NOTE — Telephone Encounter (Signed)
° °  Patient Name: Anthony Russell  DOB: [birth year] MRN: [id number]  Primary Cardiologist: Gypsy Balsam, MD  Chart reviewed as part of pre-operative protocol coverage. Patient was last seen 03/01/21 by Dr. Bing Matter to evaluate atypical chest pain. EGD is planned as requested below. Per notes this has been rescheduled to 04/26/21. Cor CT is scheduled for 03/14/21, await result before providing final recommendation. Preop team will follow peripherally for result.  Laurann Montana, PA-C 03/09/2021, 10:09 AM

## 2021-03-10 ENCOUNTER — Ambulatory Visit (INDEPENDENT_AMBULATORY_CARE_PROVIDER_SITE_OTHER): Payer: BC Managed Care – PPO | Admitting: Psychiatry

## 2021-03-10 ENCOUNTER — Encounter: Payer: Self-pay | Admitting: Psychiatry

## 2021-03-10 VITALS — BP 123/81 | HR 93 | Ht 70.0 in | Wt 229.2 lb

## 2021-03-10 DIAGNOSIS — I10 Essential (primary) hypertension: Secondary | ICD-10-CM | POA: Diagnosis not present

## 2021-03-10 DIAGNOSIS — R0789 Other chest pain: Secondary | ICD-10-CM | POA: Diagnosis not present

## 2021-03-10 DIAGNOSIS — R079 Chest pain, unspecified: Secondary | ICD-10-CM | POA: Diagnosis not present

## 2021-03-10 DIAGNOSIS — R519 Headache, unspecified: Secondary | ICD-10-CM | POA: Diagnosis not present

## 2021-03-10 NOTE — Patient Instructions (Addendum)
Decrease propranolol to 20 mg daily. Let me know if headaches return at this decreased dose Follow up in 1 year or sooner if headaches worsen

## 2021-03-10 NOTE — Progress Notes (Signed)
° °  CC:  headaches  Follow-up Visit  Last visit: 10/19/20  Brief HPI: 41 year old male with a history of asthma, HLD, DM who follows in clinic for primary sex headache. Developed severe headache 09/04/20 which was followed by persistent headaches. MRI and CTA head/neck unremarkable.  Interval History: Indomethacin helped headaches temporarily but pain persisted, so he was started on propranolol 20 mg BID. Headaches resolved with this medication, though he does feel like he is in a haze when he takes it. Has also noticed some chest pressure, which he thinks is GERD related. He has not had any more explosive headaches since his initial one in August 2022.  MRI brain 02/10/21 was unremarkable other than asymmetric tonsils.  Headache days per month: 0 Headache free days per month: 30  Current Headache Regimen: Preventative: propranolol 20 mg BID  Prior Therapies                                  Propranolol 20 mg BID Indomethacin - helped temporarily  Physical Exam:   Vital Signs: BP 123/81    Pulse 93    Ht 5\' 10"  (1.778 m)    Wt 229 lb 3.2 oz (104 kg)    BMI 32.89 kg/m  GENERAL:  well appearing, in no acute distress, alert  SKIN:  Color, texture, turgor normal. No rashes or lesions HEAD:  Normocephalic/atraumatic. RESP: normal respiratory effort MSK:  No gross joint deformities.   NEUROLOGICAL: Mental Status: Alert, oriented to person, place and time, Follows commands, and Speech fluent and appropriate. Cranial Nerves: PERRL, face symmetric, no dysarthria, hearing grossly intact Motor: moves all extremities equally Gait: normal-based.  IMPRESSION: 41 year old male with a history of asthma, HLD, DM who presents for follow up of daily headaches following sex headache in August 2022. His headaches have resolved on propranolol, however it makes him feel like he is in a haze. Also has some chest pressure which can be a side effect of propranolol. Will try decreasing the dose to 20 mg  daily and see if this can provide relief while minimizing side effects.  PLAN: -Preventive: Decrease propranolol to 20 mg daily -next steps: consider Topamax, SNRI for prevention  Follow-up: 1 year or sooner if needed  I spent a total of 19 minutes on the date of the service.  Discussed medication side effects, adverse reactions and drug interactions. Written educational materials and patient instructions outlining all of the above were given.  September 2022, MD 03/10/21 3:59 PM

## 2021-03-11 ENCOUNTER — Telehealth (HOSPITAL_COMMUNITY): Payer: Self-pay | Admitting: Emergency Medicine

## 2021-03-11 ENCOUNTER — Telehealth: Payer: Self-pay

## 2021-03-11 ENCOUNTER — Other Ambulatory Visit: Payer: Self-pay

## 2021-03-11 ENCOUNTER — Ambulatory Visit (INDEPENDENT_AMBULATORY_CARE_PROVIDER_SITE_OTHER): Payer: BC Managed Care – PPO | Admitting: Pulmonary Disease

## 2021-03-11 ENCOUNTER — Encounter: Payer: Self-pay | Admitting: Pulmonary Disease

## 2021-03-11 VITALS — BP 122/68 | HR 85 | Temp 98.6°F | Ht 70.0 in | Wt 230.0 lb

## 2021-03-11 DIAGNOSIS — J4520 Mild intermittent asthma, uncomplicated: Secondary | ICD-10-CM | POA: Diagnosis not present

## 2021-03-11 DIAGNOSIS — K209 Esophagitis, unspecified without bleeding: Secondary | ICD-10-CM

## 2021-03-11 LAB — BASIC METABOLIC PANEL
BUN/Creatinine Ratio: 18 (ref 9–20)
BUN: 15 mg/dL (ref 6–24)
CO2: 26 mmol/L (ref 20–29)
Calcium: 9.7 mg/dL (ref 8.7–10.2)
Chloride: 104 mmol/L (ref 96–106)
Creatinine, Ser: 0.83 mg/dL (ref 0.76–1.27)
Glucose: 204 mg/dL — ABNORMAL HIGH (ref 70–99)
Potassium: 4.5 mmol/L (ref 3.5–5.2)
Sodium: 144 mmol/L (ref 134–144)
eGFR: 113 mL/min/{1.73_m2} (ref >59)

## 2021-03-11 MED ORDER — ADVAIR HFA 230-21 MCG/ACT IN AERO: 2.0000 | INHALATION_SPRAY | Freq: Two times a day (BID) | RESPIRATORY_TRACT | 3 refills | Status: DC

## 2021-03-11 NOTE — Telephone Encounter (Signed)
Reaching out to patient to offer assistance regarding upcoming cardiac imaging study; pt verbalizes understanding of appt date/time, parking situation and where to check in, pre-test NPO status and medications ordered, and verified current allergies; name and call back number provided for further questions should they arise ?Kanoa Phillippi RN Navigator Cardiac Imaging ? Heart and Vascular ?[phone] office ?[phone] cell ? ?Denies iv issues ? 100mg metoprolol tartrate ?Arrival 200 ?

## 2021-03-11 NOTE — Telephone Encounter (Signed)
Patient notified of results.

## 2021-03-11 NOTE — Progress Notes (Signed)
'@Patient'  ID: Anthony Russell, male    DOB: 04/05/1980, 41 y.o.   MRN: [id number]  Chief Complaint  Patient presents with   Consult    Consult for chest pressure, tingliing, cough. This started in late December. Some mucus production that is clear in apperance.     Referring provider: Elise Benne  HPI:   41 y.o. whom we are seeing in consultation for chest discomfort and asthma.  Recent GI note reviewed.  Most recent cardiology note reviewed.  Notes onset of upper belly pain, low chest pain several months ago.  Seen by GI.  Increasing GERD medications without improvement.  CT abdomen pelvis ordered which was most notable for esophagitis.  Tried sulcal fate without improvement but did increase his sugars.  Unfortunate, developed epiglottitis and upper airway infection 12/2020.  Got dose dexamethasone.  Improved swelling.  Antibiotics prescribed also help.  Since then he describes a chest pressure discomfort.  Points, substernal.  Worse when he lies supine.  No exertional component.  No dyspnea.  Little bit worse when sitting up compared to standing up.  No other alleviating or exacerbating factors, positional changes, time of day, seasonal or environmental factors he can apply to make things better or worse.  He continues on high-dose Symbicort.  This does not help at all.  Seems to make things worse.  Albuterol provides some mild relief over the course of a couple of hours.  Review chest x-ray 02/11/2021 that on my review interpretation reveals clear lungs bilaterally.  CTA PE protocol from same date reviewed which shows clear lungs, no abnormality on my review and interpretation.  PMH: Hypertension, asthma, diabetes, hyperlipidemia, headaches, sleep apnea Surgical history: Neck surgery 2007  family history: Allergies, CAD in first relatives Social history: Former smoker, quit 2014, 1 pack year history, lives in [place] / Pulmonary Flowsheets:   ACT:  No  flowsheet data found.  MMRC: No flowsheet data found.  Epworth:  Results of the Epworth flowsheet 11/03/2015  Sitting and reading 2  Watching TV 2  Sitting, inactive in a public place (e.g. a theatre or a meeting) 2  As a passenger in a car for an hour without a break 2  Lying down to rest in the afternoon when circumstances permit 3  Sitting and talking to someone 1  Sitting quietly after a lunch without alcohol 2  In a car, while stopped for a few minutes in traffic 2  Total score 16    Tests:   FENO:  No results found for: NITRICOXIDE  PFT: No flowsheet data found.  WALK:  No flowsheet data found.  Imaging: Personally reviewed and as per EMR discussion this note DG Chest 2 View  Result Date: 02/11/2021 CLINICAL DATA:  Chest pain, shortness of breath EXAM: CHEST - 2 VIEW COMPARISON:  11/19/2018 FINDINGS: The heart size and mediastinal contours are within normal limits. Both lungs are clear. The visualized skeletal structures are unremarkable. IMPRESSION: No active cardiopulmonary disease. Electronically Signed   By: Davina Poke D.O.   On: 02/11/2021 10:48   CT Angio Chest PE W/Cm &/Or Wo Cm  Result Date: 02/11/2021 CLINICAL DATA:  Congestion, chest tightness, palpitations EXAM: CT ANGIOGRAPHY CHEST WITH CONTRAST TECHNIQUE: Multidetector CT imaging of the chest was performed using the standard protocol during bolus administration of intravenous contrast. Multiplanar CT image reconstructions and MIPs were obtained to evaluate the vascular anatomy. RADIATION DOSE REDUCTION: This exam was performed according to the departmental dose-optimization  program which includes automated exposure control, adjustment of the mA and/or kV according to patient size and/or use of iterative reconstruction technique. CONTRAST:  155m OMNIPAQUE IOHEXOL 350 MG/ML SOLN COMPARISON:  None. FINDINGS: Cardiovascular: Satisfactory opacification of the pulmonary arteries to the segmental level. No  evidence of pulmonary embolism. Normal heart size. No pericardial effusion. Mediastinum/Nodes: No enlarged mediastinal, hilar, or axillary lymph nodes. Thyroid gland, trachea, and esophagus demonstrate no significant findings. Lungs/Pleura: Lungs are clear. No pleural effusion or pneumothorax. Upper Abdomen: No acute abnormality. Musculoskeletal: No chest wall abnormality. No acute or significant osseous findings. Review of the MIP images confirms the above findings. IMPRESSION: 1. No evidence of pulmonary embolus. Electronically Signed   By: HKathreen DevoidM.D.   On: 02/11/2021 14:01   MR Brain Wo Contrast  Result Date: 02/10/2021 CLINICAL DATA:  Paresthesia. Arm heaviness. Numbness and tingling in left arm. Additional history provided: Neuro deficit, acute, stroke suspected. Patient had paresthesias in left upper extremity and arm heaviness. EXAM: MRI HEAD WITHOUT CONTRAST TECHNIQUE: Multiplanar, multiecho pulse sequences of the brain and surrounding structures were obtained without intravenous contrast. COMPARISON:  Noncontrast head CT and CT angiogram head/neck 02/01/2021. Brain MRI 09/08/2020. Neck CT 01/18/2021. FINDINGS: Brain: Cerebral volume is normal. No cortical encephalomalacia is identified. No significant cerebral white matter disease. There is no acute infarct. No evidence of an intracranial mass. No chronic intracranial blood products. No extra-axial fluid collection. No midline shift. Vascular: Maintained flow voids within the proximal large arterial vessels. Skull and upper cervical spine: No focal suspicious marrow lesion. Sinuses/Orbits: Visualized orbits show no acute finding. Small fluid levels within the bilateral sphenoid sinuses. Trace mucosal thickening within the bilateral ethmoid sinuses. Other: Redemonstrated asymmetric prominence of the left palatine tonsil (series 12, image 16). IMPRESSION: Unremarkable non-contrast MRI appearance of the brain. No evidence of acute intracranial  abnormality. Bilateral ethmoid and sphenoid sinusitis, as described. Redemonstrated asymmetric prominence of the left palatine tonsil, more fully assessed on the recent prior neck CT of 01/18/2021. This may reflect asymmetric tonsillar inflammation or a mucosal neoplasm, and direct visualization is recommended. Electronically Signed   By: KKellie SimmeringD.O.   On: 02/10/2021 17:49    Lab Results: Personally reviewed, no anemia, no significant elevation in eosinophils CBC    Component Value Date/Time   WBC 6.8 02/11/2021 1010   RBC 5.12 02/11/2021 1010   HGB 14.7 02/11/2021 1010   HCT 46.0 02/11/2021 1010   PLT 186 02/11/2021 1010   MCV 89.8 02/11/2021 1010   MCH 28.7 02/11/2021 1010   MCHC 32.0 02/11/2021 1010   RDW 13.9 02/11/2021 1010   LYMPHSABS 1.4 02/01/2021 1500   MONOABS 0.5 02/01/2021 1500   EOSABS 0.0 02/01/2021 1500   BASOSABS 0.0 02/01/2021 1500    BMET    Component Value Date/Time   NA 144 03/10/2021 0910   K 4.5 03/10/2021 0910   CL 104 03/10/2021 0910   CO2 26 03/10/2021 0910   GLUCOSE 204 (H) 03/10/2021 0910   GLUCOSE 109 (H) 02/11/2021 1010   BUN 15 03/10/2021 0910   CREATININE 0.83 03/10/2021 0910   CALCIUM 9.7 03/10/2021 0910   GFRNONAA >60 02/11/2021 1010   GFRAA >60 03/29/2018 1706    BNP No results found for: BNP  ProBNP No results found for: PROBNP  Specialty Problems       Pulmonary Problems   Reactive airways dysfunction syndrome (HCC)   Allergic rhinitis   Asthma in adult   Acute bacterial bronchitis  Sleep apnea    No Known Allergies  Immunization History  Administered Date(s) Administered   DTP 11/12/1980, 01/18/1981   Hepatitis B 10/12/1995, 11/15/1995, 05/02/1996   Influenza,inj,Quad PF,6+ Mos 11/16/2020   MMR 12/03/1981, 11/15/1995   PFIZER(Purple Top)SARS-COV-2 Vaccination 04/26/2019, 05/18/2019, 01/01/2020   Pneumococcal Polysaccharide-23 04/05/2020   Tdap 08/13/2012    Past Medical History:  Diagnosis Date   Allergy     Allergy, unspecified not elsewhere classified    rx w/ OTC antihistamines PRN   Anxiety    Asthma    Atypical chest pain    recent neg eval w/ normal cxr/ekg   Depression    DM (diabetes mellitus) ([place])    Dyspepsia    on protonix 70m/d   GERD (gastroesophageal reflux disease)    on protonix 441md   HTN (hypertension)    Hyperlipidemia    on diet alone   IBS (irritable bowel syndrome)    Lumbar back pain    s/p lumbar laminectomy 2007 by DrCabbell   Migraines    Overweight(278.02)    weight Jan10=246#...Marland Kitchene was 225# in 9/07...diet and exercise was discussed   Sleep apnea     Tobacco History: Social History   Tobacco Use  Smoking Status Former   Packs/day: 0.10   Years: 8.00   Pack years: 0.80   Types: Cigarettes   Quit date: 03/03/2011   Years since quitting: 10.0  Smokeless Tobacco Never   Counseling given: Not Answered   Continue to not smoke  Outpatient Encounter Medications as of 03/11/2021  Medication Sig   albuterol (VENTOLIN HFA) 108 (90 Base) MCG/ACT inhaler INHALE 2 PUFFS INTO THE LUNGS EVERY 6 HOURS AS NEEDED FOR WHEEZING OR SHORTNESS OF BREATH   atorvastatin (LIPITOR) 10 MG tablet TAKE 1 TABLET(10 MG) BY MOUTH DAILY   calcium carbonate (OS-CAL) 1250 (500 Ca) MG chewable tablet Chew 2 tablets by mouth daily.   dapagliflozin propanediol (FARXIGA) 5 MG TABS tablet Take 1 tablet (5 mg total) by mouth daily.   famotidine (PEPCID) 20 MG tablet Take 1 tablet (20 mg total) by mouth at bedtime.   fluticasone-salmeterol (ADVAIR HFA) 230-21 MCG/ACT inhaler Inhale 2 puffs into the lungs 2 (two) times daily.   glycopyrrolate (ROBINUL) 2 MG tablet Take 1 tablet (2 mg total) by mouth 2 (two) times daily as needed (Abdominal disccomfort).   L-Arginine 500 MG CAPS Take 2 capsules by mouth 2 (two) times daily.   levocetirizine (XYZAL) 5 MG tablet Take 1 tablet (5 mg total) by mouth every evening.   metFORMIN (GLUCOPHAGE) 1000 MG tablet TAKE 1 TABLET(1000 MG) BY MOUTH  TWICE DAILY WITH A MEAL   Multiple Vitamin (MULTIVITAMIN ADULT PO) Take 2 tablets by mouth daily.   Nutritional Supplements (PYCNOGENOL) 300-30 MG CAPS Take 1 capsule by mouth 3 (three) times daily.   pantoprazole (PROTONIX) 40 MG tablet Take 1 tablet (40 mg total) by mouth 2 (two) times daily before a meal.   propranolol (INDERAL) 20 MG tablet Take 1 tablet (20 mg total) by mouth 2 (two) times daily.   sucralfate (CARAFATE) 1 GM/10ML suspension Take 10 mLs (1 g total) by mouth 4 (four) times daily -  before meals and at bedtime.   [DISCONTINUED] budesonide-formoterol (SYMBICORT) 160-4.5 MCG/ACT inhaler Inhale 2 puffs into the lungs 2 (two) times daily.   metoprolol tartrate (LOPRESSOR) 100 MG tablet Take 1 tablet (100 mg total) by mouth once for 1 dose.   [DISCONTINUED] dexlansoprazole (DEXILANT) 60 MG capsule Take 1 capsule (60 mg  total) by mouth daily.   No facility-administered encounter medications on file as of 03/11/2021.     Review of Systems  Review of Systems  No chest pain with exertion.  No orthopnea or PND.  Comprehensive review of systems otherwise negative. Physical Exam  BP 122/68 (BP Location: Left Arm, Patient Position: Sitting, Cuff Size: Normal)    Pulse 85    Temp 98.6 F (37 C) (Oral)    Ht '5\' 10"'  (1.778 m)    Wt 230 lb (104.3 kg)    SpO2 99%    BMI 33.00 kg/m   Wt Readings from Last 5 Encounters:  03/11/21 230 lb (104.3 kg)  03/10/21 229 lb 3.2 oz (104 kg)  03/01/21 226 lb (102.5 kg)  02/23/21 229 lb (103.9 kg)  02/15/21 229 lb 6.4 oz (104.1 kg)    BMI Readings from Last 5 Encounters:  03/11/21 33.00 kg/m  03/10/21 32.89 kg/m  03/01/21 32.43 kg/m  02/23/21 34.31 kg/m  02/15/21 32.92 kg/m     Physical Exam General: Sitting in chair, no acute distress Eyes: EOMI, icterus Neck: Supple, no JVP Pulmonary: Clear, no work of breathing, good air movement Cardiovascular: Regular rhythm, no murmur Abdomen: Nondistended, bowel sounds present MSK: No  synovitis, joint effusion Neuro: Normal gait, no weakness Psych: Normal mood, flat affect   Assessment & Plan:   Chest discomfort: Pressure, tightness worse sitting down or lying supine.  Suspect most related to GERD and esophagitis seen on imaging 12/27/2020.  No real improvement with Symbicort.  Slight improvement with albuterol.  Possibly related to uncontrolled asthma.  See below.  Highly recommend EGD once deemed appropriate after cardiac work-up.  Asthma: Overall seems well controlled.  Dyspnea minimal.  Symptoms as above.  Stop Symbicort.  Trial high-dose Advair HFA 2 puffs twice daily.  Continue albuterol as needed.  Could consider empiric course of prednisone to see if helps symptoms but given high suspicion for esophagitis/GERD as primary driver and the fact that he had worsening symptoms after dexamethasone for epiglottitis and his underlying diabetes, relatively high suspicion for potential candidiasis.  No oral candidiasis on exam.  As such, defer prednisone therapy for now but will reconsider in the future pending symptoms.   Return in about 2 months (around 05/09/2021).   Lanier Clam, MD 03/11/2021

## 2021-03-11 NOTE — Telephone Encounter (Signed)
-----   Message from Georgeanna Lea, MD sent at 03/11/2021 10:41 AM EST ----- Labs are good, continue with plans

## 2021-03-11 NOTE — Patient Instructions (Signed)
Nice to meet you  Stop Symbicort  Start Advair 2 puffs twice a day everyday - rinse mouth after every use  Continue albuterol as needed  I worry symptoms related to esophagitis seen on CT scan in November.  Return to clinic in 2 months

## 2021-03-14 ENCOUNTER — Encounter (HOSPITAL_COMMUNITY): Payer: Self-pay

## 2021-03-14 ENCOUNTER — Ambulatory Visit (HOSPITAL_COMMUNITY)
Admission: RE | Admit: 2021-03-14 | Discharge: 2021-03-14 | Disposition: A | Discharge status: Home / Self Care | Payer: BC Managed Care – PPO | Source: Ambulatory Visit | Attending: Cardiology | Admitting: Cardiology

## 2021-03-14 ENCOUNTER — Other Ambulatory Visit: Payer: Self-pay

## 2021-03-14 DIAGNOSIS — R079 Chest pain, unspecified: Secondary | ICD-10-CM | POA: Insufficient documentation

## 2021-03-14 MED ORDER — METOPROLOL TARTRATE 5 MG/5ML IV SOLN
INTRAVENOUS | Status: AC
  Administered 2021-03-14: 10 mg via INTRAVENOUS
  Filled 2021-03-14: qty 10

## 2021-03-14 MED ORDER — DILTIAZEM HCL 25 MG/5ML IV SOLN
10.0000 mg | Freq: Once | INTRAVENOUS | Status: AC
  Administered 2021-03-14: 10 mg via INTRAVENOUS

## 2021-03-14 MED ORDER — METOPROLOL TARTRATE 5 MG/5ML IV SOLN
INTRAVENOUS | Status: AC
  Filled 2021-03-14: qty 10

## 2021-03-14 MED ORDER — METOPROLOL TARTRATE 5 MG/5ML IV SOLN: 10.0000 mg | Freq: Once | INTRAVENOUS | Status: AC

## 2021-03-14 MED ORDER — NITROGLYCERIN 0.4 MG SL SUBL
0.8000 mg | SUBLINGUAL_TABLET | Freq: Once | SUBLINGUAL | Status: AC
Start: 2021-03-14 — End: 2021-03-14
  Administered 2021-03-14: 0.8 mg via SUBLINGUAL

## 2021-03-14 MED ORDER — DILTIAZEM HCL 25 MG/5ML IV SOLN
INTRAVENOUS | Status: AC
  Administered 2021-03-14: 10 mg via INTRAVENOUS
  Filled 2021-03-14: qty 5

## 2021-03-14 MED ORDER — DILTIAZEM HCL 25 MG/5ML IV SOLN: 10.0000 mg | Freq: Once | INTRAVENOUS | Status: AC

## 2021-03-14 MED ORDER — METOPROLOL TARTRATE 5 MG/5ML IV SOLN
10.0000 mg | Freq: Once | INTRAVENOUS | Status: AC
  Administered 2021-03-14: 10 mg via INTRAVENOUS

## 2021-03-14 MED ORDER — NITROGLYCERIN 0.4 MG SL SUBL
SUBLINGUAL_TABLET | SUBLINGUAL | Status: AC
  Filled 2021-03-14: qty 2

## 2021-03-14 MED ORDER — IOHEXOL 350 MG/ML SOLN
95.0000 mL | Freq: Once | INTRAVENOUS | Status: AC | PRN
  Administered 2021-03-14: 95 mL via INTRAVENOUS

## 2021-03-15 NOTE — Telephone Encounter (Signed)
° ° °  Patient Name: Anthony Russell  DOB: 1980-12-14 MRN: [id number]  Primary Cardiologist: Gypsy Balsam, MD  Chart reviewed as part of pre-operative protocol coverage. Patient was recently seen by Dr. Bing Matter on 03/01/2021 for evaluation of chest pain. Chest pain felt to be atypical but coronary CTA was ordered for further evaluation. Coronary CTA on 03/14/2021 showed coronary calcium score of 0 with no evidence of CAD. Dr. Bing Matter felt GI work-up was an excellent idea. Given past medical history and time since last visit, based on ACC/AHA guidelines, Anthony Russell would be at acceptable risk for the planned procedure without further cardiovascular testing.   I will route this recommendation to the requesting party via Epic fax function and remove from pre-op pool.  Please call with questions.  Corrin Parker, PA-C 03/15/2021, 11:53 AM

## 2021-03-22 ENCOUNTER — Encounter: Payer: Self-pay | Admitting: Internal Medicine

## 2021-03-25 ENCOUNTER — Encounter: Payer: Self-pay | Admitting: Pulmonary Disease

## 2021-03-25 ENCOUNTER — Encounter: Payer: Self-pay | Admitting: Internal Medicine

## 2021-03-25 ENCOUNTER — Ambulatory Visit (AMBULATORY_SURGERY_CENTER): Payer: BC Managed Care – PPO | Admitting: Internal Medicine

## 2021-03-25 ENCOUNTER — Other Ambulatory Visit: Payer: Self-pay

## 2021-03-25 VITALS — BP 113/73 | HR 95 | Temp 98.1°F | Resp 21 | Ht 70.0 in | Wt 229.0 lb

## 2021-03-25 DIAGNOSIS — K2970 Gastritis, unspecified, without bleeding: Secondary | ICD-10-CM | POA: Diagnosis not present

## 2021-03-25 DIAGNOSIS — K219 Gastro-esophageal reflux disease without esophagitis: Secondary | ICD-10-CM | POA: Diagnosis not present

## 2021-03-25 DIAGNOSIS — K2950 Unspecified chronic gastritis without bleeding: Secondary | ICD-10-CM | POA: Diagnosis not present

## 2021-03-25 DIAGNOSIS — K229 Disease of esophagus, unspecified: Secondary | ICD-10-CM | POA: Diagnosis not present

## 2021-03-25 DIAGNOSIS — G4733 Obstructive sleep apnea (adult) (pediatric): Secondary | ICD-10-CM

## 2021-03-25 MED ORDER — SODIUM CHLORIDE 0.9 % IV SOLN: 500.0000 mL | Freq: Once | INTRAVENOUS | Status: DC

## 2021-03-25 NOTE — Patient Instructions (Signed)
Follow a gastroparesis diet (Eating 5 small meals throughout the day and avoiding fatty foods.) Return to GI clinic in 1 month.  YOU HAD AN ENDOSCOPIC PROCEDURE TODAY AT THE [facility]:   Refer to the procedure report that was given to you for any specific questions about what was found during the examination.  If the procedure report does not answer your questions, please call your gastroenterologist to clarify.  If you requested that your care partner not be given the details of your procedure findings, then the procedure report has been included in a sealed envelope for you to review at your convenience later.  YOU SHOULD EXPECT: Some feelings of bloating in the abdomen. Passage of more gas than usual.  Walking can help get rid of the air that was put into your GI tract during the procedure and reduce the bloating. If you had a lower endoscopy (such as a colonoscopy or flexible sigmoidoscopy) you may notice spotting of blood in your stool or on the toilet paper. If you underwent a bowel prep for your procedure, you may not have a normal bowel movement for a few days.  Please Note:  You might notice some irritation and congestion in your nose or some drainage.  This is from the oxygen used during your procedure.  There is no need for concern and it should clear up in a day or so.  SYMPTOMS TO REPORT IMMEDIATELY:  Following upper endoscopy (EGD)  Vomiting of blood or coffee ground material  New chest pain or pain under the shoulder blades  Painful or persistently difficult swallowing  New shortness of breath  Fever of 100F or higher  Black, tarry-looking stools  For urgent or emergent issues, a gastroenterologist can be reached at any hour by calling [phone]. Do not use MyChart messaging for urgent concerns.    DIET:  We do recommend a small meal at first, but then you may proceed to your regular diet.  Drink plenty of fluids but you should avoid alcoholic beverages for  24 hours.  ACTIVITY:  You should plan to take it easy for the rest of today and you should NOT DRIVE or use heavy machinery until tomorrow (because of the sedation medicines used during the test).    FOLLOW UP: Our staff will call the number listed on your records 48-72 hours following your procedure to check on you and address any questions or concerns that you may have regarding the information given to you following your procedure. If we do not reach you, we will leave a message.  We will attempt to reach you two times.  During this call, we will ask if you have developed any symptoms of COVID 19. If you develop any symptoms (ie: fever, flu-like symptoms, shortness of breath, cough etc.) before then, please call [phone].  If you test positive for Covid 19 in the 2 weeks post procedure, please call and report this information to [place].    If any biopsies were taken you will be contacted by phone or by letter within the next 1-3 weeks.  Please call us at [phone] if you have not heard about the biopsies in 3 weeks.    SIGNATURES/CONFIDENTIALITY: You and/or your care partner have signed paperwork which will be entered into your electronic medical record.  These signatures attest to the fact that that the information above on your After Visit Summary has been reviewed and is understood.  Full responsibility of the confidentiality of this  discharge information lies with you and/or your care-partner.

## 2021-03-25 NOTE — Progress Notes (Signed)
Called to room to assist during endoscopic procedure.  Patient ID and intended procedure confirmed with present staff. Received instructions for my participation in the procedure from the performing physician.  

## 2021-03-25 NOTE — Progress Notes (Signed)
CNW - VS   

## 2021-03-25 NOTE — Op Note (Signed)
[facility] Patient Name: Anthony Russell Procedure Date: 03/25/2021 8:37 AM MRN: [id number] Endoscopist: Nicole Kindred "Anthony Russell ,  Age: 41 Referring MD:  Date of Birth: 01-15-1981 Gender: Male Account #: [id number] Procedure:                Upper GI endoscopy Indications:              Epigastric abdominal pain Medicines:                Monitored Anesthesia Care Procedure:                Pre-Anesthesia Assessment:                           - Prior to the procedure, a History and Physical                            was performed, and patient medications and                            allergies were reviewed. The patient's tolerance of                            previous anesthesia was also reviewed. The risks                            and benefits of the procedure and the sedation                            options and risks were discussed with the patient.                            All questions were answered, and informed consent                            was obtained. Prior Anticoagulants: The patient has                            taken no previous anticoagulant or antiplatelet                            agents. ASA Grade Assessment: III - A patient with                            severe systemic disease. After reviewing the risks                            and benefits, the patient was deemed in                            satisfactory condition to undergo the procedure.                           After obtaining informed consent, the endoscope was  passed under direct vision. Throughout the                            procedure, the patient's blood pressure, pulse, and                            oxygen saturations were monitored continuously. The                            Endoscope was introduced through the mouth, and                            advanced to the second part of duodenum. The upper                            GI endoscopy was  accomplished without difficulty.                            The patient tolerated the procedure well. Scope In: Scope Out: Findings:                 The examined esophagus was normal. This was                            biopsied with a cold forceps for histology.                           Localized inflammation characterized by congestion                            (edema) and erythema was found in the gastric body.                            Biopsies were taken with a cold forceps for                            histology.                           A small amount of food (residue) was found in the                            gastric antrum.                           Food (residue) was found in the duodenal bulb.                           Biopsies were taken with a cold forceps in the                            duodenal bulb and in the second portion of the                            duodenum  for histology. Complications:            No immediate complications. Estimated Blood Loss:     Estimated blood loss was minimal. Impression:               - Normal esophagus. Biopsied.                           - Gastritis. Biopsied.                           - A small amount of food (residue) in the stomach.                           - Retained food in the duodenum.                           - Biopsies were taken with a cold forceps for                            histology in the duodenal bulb and in the second                            portion of the duodenum. Recommendation:           - Discharge patient to home (with escort).                           - Await pathology results.                           - Recommend following a gastroparesis diet (eating                            5 small meals throughout the day and avoiding fatty                            foods).                           - Return to GI clinic in 1 month.                           - The findings and recommendations were  discussed                            with the patient. Nicole Kindred "Anthony Russell,  03/25/2021 8:55:38 AM

## 2021-03-25 NOTE — Progress Notes (Signed)
GASTROENTEROLOGY PROCEDURE H&P NOTE   Primary Care Physician: Esperanza Richters, PA-C    Reason for Procedure:   Epigastric ab pain  Plan:    EGD  Patient is appropriate for endoscopic procedure(s) in the ambulatory ([facility]) setting.  The nature of the procedure, as well as the risks, benefits, and alternatives were carefully and thoroughly reviewed with the patient. Ample time for discussion and questions allowed. The patient understood, was satisfied, and agreed to proceed.     HPI: Anthony Russell is a 41 y.o. male who presents for EGD for evaluation of epigastric ab pain .  Patient was most recently seen in the Gastroenterology Clinic on 12/16/20.  No interval change in medical history since that appointment. Please refer to that note for full details regarding GI history and clinical presentation.   Past Medical History:  Diagnosis Date   Allergy    Allergy, unspecified not elsewhere classified    rx w/ OTC antihistamines PRN   Anxiety    Asthma    Atypical chest pain    recent neg eval w/ normal cxr/ekg   Depression    DM (diabetes mellitus) (HCC)    Dyspepsia    on protonix 40mg /d   GERD (gastroesophageal reflux disease)    on protonix 40mg /d   HTN (hypertension)    Hyperlipidemia    on diet alone   IBS (irritable bowel syndrome)    Lumbar back pain    s/p lumbar laminectomy 2007 by DrCabbell   Migraines    Overweight(278.02)    weight Jan10=246#.[name].he was 225# in 9/07...diet and exercise was discussed   Sleep apnea    not wearing c-pap currently    Past Surgical History:  Procedure Laterality Date   DENTAL SURGERY     LUMBAR LAMINECTOMY  01/30/2005   DrCabbell   TEAR DUCT PROBING      Prior to Admission medications   Medication Sig Start Date End Date Taking? Authorizing Provider  albuterol (VENTOLIN HFA) 108 (90 Base) MCG/ACT inhaler INHALE 2 PUFFS INTO THE LUNGS EVERY 6 HOURS AS NEEDED FOR WHEEZING OR SHORTNESS OF BREATH 02/09/21  Yes Saguier, Ramon Dredge,  PA-C  atorvastatin (LIPITOR) 10 MG tablet TAKE 1 TABLET(10 MG) BY MOUTH DAILY 02/14/21  Yes Saguier, Ramon Dredge, PA-C  calcium carbonate (OS-CAL) 1250 (500 Ca) MG chewable tablet Chew 2 tablets by mouth daily.   Yes [provider]  dapagliflozin propanediol (FARXIGA) 5 MG TABS tablet Take 1 tablet (5 mg total) by mouth daily. 11/17/20  Yes Saguier, Ramon Dredge, PA-C  famotidine (PEPCID) 20 MG tablet Take 1 tablet (20 mg total) by mouth at bedtime. 12/16/20  Yes Meredith Pel, NP  fluticasone-salmeterol (ADVAIR HFA) 230-21 MCG/ACT inhaler Inhale 2 puffs into the lungs 2 (two) times daily. 03/11/21  Yes Hunsucker, Lesia Sago, MD  metFORMIN (GLUCOPHAGE) 1000 MG tablet TAKE 1 TABLET(1000 MG) BY MOUTH TWICE DAILY WITH A MEAL 12/22/20  Yes Saguier, Ramon Dredge, PA-C  Nutritional Supplements (PYCNOGENOL) 300-30 MG CAPS Take 1 capsule by mouth 3 (three) times daily. 08/30/20  Yes [provider]  pantoprazole (PROTONIX) 40 MG tablet Take 1 tablet (40 mg total) by mouth 2 (two) times daily before a meal. 12/16/20  Yes Meredith Pel, NP  propranolol (INDERAL) 20 MG tablet Take 1 tablet (20 mg total) by mouth 2 (two) times daily. 10/25/20  Yes Ocie Doyne, MD  glycopyrrolate (ROBINUL) 2 MG tablet Take 1 tablet (2 mg total) by mouth 2 (two) times daily as needed (Abdominal disccomfort).  12/16/20   Meredith Pel, NP  L-Arginine 500 MG CAPS Take 2 capsules by mouth 2 (two) times daily. 08/30/20   [provider]  levocetirizine (XYZAL) 5 MG tablet Take 1 tablet (5 mg total) by mouth every evening. 11/28/18   Saguier, Ramon Dredge, PA-C  metoprolol tartrate (LOPRESSOR) 100 MG tablet Take 1 tablet (100 mg total) by mouth once for 1 dose. 03/01/21 03/10/21  Georgeanna Lea, MD  Multiple Vitamin (MULTIVITAMIN ADULT PO) Take 2 tablets by mouth daily.    [provider]    Current Outpatient Medications  Medication Sig Dispense Refill   albuterol (VENTOLIN HFA) 108 (90 Base) MCG/ACT inhaler  INHALE 2 PUFFS INTO THE LUNGS EVERY 6 HOURS AS NEEDED FOR WHEEZING OR SHORTNESS OF BREATH 20.1 g 0   atorvastatin (LIPITOR) 10 MG tablet TAKE 1 TABLET(10 MG) BY MOUTH DAILY 90 tablet 3   calcium carbonate (OS-CAL) 1250 (500 Ca) MG chewable tablet Chew 2 tablets by mouth daily.     dapagliflozin propanediol (FARXIGA) 5 MG TABS tablet Take 1 tablet (5 mg total) by mouth daily. 30 tablet 3   famotidine (PEPCID) 20 MG tablet Take 1 tablet (20 mg total) by mouth at bedtime. 30 tablet 3   fluticasone-salmeterol (ADVAIR HFA) 230-21 MCG/ACT inhaler Inhale 2 puffs into the lungs 2 (two) times daily. 1 each 3   metFORMIN (GLUCOPHAGE) 1000 MG tablet TAKE 1 TABLET(1000 MG) BY MOUTH TWICE DAILY WITH A MEAL 180 tablet 1   Nutritional Supplements (PYCNOGENOL) 300-30 MG CAPS Take 1 capsule by mouth 3 (three) times daily.     pantoprazole (PROTONIX) 40 MG tablet Take 1 tablet (40 mg total) by mouth 2 (two) times daily before a meal. 60 tablet 6   propranolol (INDERAL) 20 MG tablet Take 1 tablet (20 mg total) by mouth 2 (two) times daily. 60 tablet 3   glycopyrrolate (ROBINUL) 2 MG tablet Take 1 tablet (2 mg total) by mouth 2 (two) times daily as needed (Abdominal disccomfort). 60 tablet 2   L-Arginine 500 MG CAPS Take 2 capsules by mouth 2 (two) times daily.     levocetirizine (XYZAL) 5 MG tablet Take 1 tablet (5 mg total) by mouth every evening. 30 tablet 3   metoprolol tartrate (LOPRESSOR) 100 MG tablet Take 1 tablet (100 mg total) by mouth once for 1 dose. 1 tablet 0   Multiple Vitamin (MULTIVITAMIN ADULT PO) Take 2 tablets by mouth daily.     Current Facility-Administered Medications  Medication Dose Route Frequency Provider Last Rate Last Admin   0.9 %  sodium chloride infusion  500 mL Intravenous Once Imogene Burn, MD        Allergies as of 03/25/2021   (No Known Allergies)    Family History  Problem Relation Age of Onset   Allergies Mother    Irritable bowel syndrome Mother    Diabetes Mother     Allergies Father    Diabetes Father    Hyperlipidemia Father    Irritable bowel syndrome Brother    Heart disease Brother    Allergies Brother    Breast cancer Maternal Aunt    Colon cancer Neg Hx    Esophageal cancer Neg Hx    Rectal cancer Neg Hx    Stomach cancer Neg Hx     Social History   Socioeconomic History   Marital status: Single    Spouse name: Not on file   Number of children: 0   Years of education: Not  on file   Highest education level: Not on file  Occupational History   Occupation: Systems analyst  Tobacco Use   Smoking status: Former    Packs/day: 0.10    Years: 8.00    Pack years: 0.80    Types: Cigarettes    Quit date: 03/03/2011    Years since quitting: 10.0   Smokeless tobacco: Never  Vaping Use   Vaping Use: Never used  Substance and Sexual Activity   Alcohol use: Yes    Comment: once or twice a month   Drug use: No   Sexual activity: Not on file  Other Topics Concern   Not on file  Social History Narrative   3 cups caffeine a day   Social Determinants of Health   Financial Resource Strain: Not on file  Food Insecurity: Not on file  Transportation Needs: Not on file  Physical Activity: Not on file  Stress: Not on file  Social Connections: Not on file  Intimate Partner Violence: Not on file    Physical Exam: Vital signs in last 24 hours: BP (!) 111/53    Pulse 92    Temp 98.1 F (36.7 C)    Ht 5\' 10"  (1.778 m)    Wt 229 lb (103.9 kg)    SpO2 97%    BMI 32.86 kg/m  GEN: NAD EYE: Sclerae anicteric ENT: MMM CV: Non-tachycardic Pulm: No increased WOB GI: Soft NEURO:  Alert & Oriented   , MD [facility]   03/25/2021 8:32 AM

## 2021-03-25 NOTE — Progress Notes (Signed)
To Pacu VSS. Report to Rn.tb ?

## 2021-03-28 NOTE — Telephone Encounter (Signed)
Will forward to Dr. Judeth Horn as an FYI of pt's endoscopy results.

## 2021-03-29 ENCOUNTER — Telehealth: Payer: Self-pay | Admitting: *Deleted

## 2021-03-29 ENCOUNTER — Encounter: Payer: Self-pay | Admitting: Internal Medicine

## 2021-03-29 ENCOUNTER — Telehealth: Payer: Self-pay

## 2021-03-29 MED ORDER — PREDNISONE 20 MG PO TABS: 20.0000 mg | ORAL_TABLET | Freq: Every day | ORAL | 0 refills | Status: AC

## 2021-03-29 NOTE — Telephone Encounter (Signed)
°  Follow up Call-  Call back number 03/25/2021  Post procedure Call Back phone  # #[phone] cell  Permission to leave phone message Yes  Some recent data might be hidden     Patient questions:  Do you have a fever, pain , or abdominal swelling? No. Pain Score  0 *  Have you tolerated food without any problems? Yes.    Have you been able to return to your normal activities? Yes.    Do you have any questions about your discharge instructions: Diet   No. Medications  No. Follow up visit  No.  Do you have questions or concerns about your Care? No.  Actions: * If pain score is 4 or above: No action needed, pain <4.

## 2021-03-29 NOTE — Telephone Encounter (Signed)
°  Follow up Call-  Call back number 03/25/2021  Post procedure Call Back phone  # #[phone] cell  Permission to leave phone message Yes  Some recent data might be hidden   First follow up call, LVM

## 2021-04-05 ENCOUNTER — Other Ambulatory Visit: Payer: Self-pay | Admitting: Nurse Practitioner

## 2021-04-05 ENCOUNTER — Telehealth: Payer: Self-pay | Admitting: Internal Medicine

## 2021-04-05 NOTE — Telephone Encounter (Signed)
Patient called complaining that he is still having issues with his esophagus.  He says he "can feel it working," feels like it's harder for him to breathe, and the gastroparesis diet and reflux medications he's tried is not working for him.  He has an appointment with Dr. Leonides Schanz on 3/29 and wants to know if there is anything else he can try or do in the interim.  Please call patient and advise.  Thank you. ?

## 2021-04-05 NOTE — Telephone Encounter (Signed)
Called pt to inquire further about his symptoms. Most concerning symptoms voiced by pt was "I feel a pulse in the middle of chest towards my abdomen". Pt further added he purchased a pulse ox to monitor his pulse. While he does not feel short of breath, pt states his pulse does rise to ~ 110 with very minimal activity and with very minimal exertion, at rest pulse is typically around 90. Further adds he has had his pulse raise to ~120 after eating. Pt denies having been followed nor seen by cardiologist. Pt also states he has not been evaluated by his PCP for any cardiac concerns. Denies having any noticed any pedal edema or significant wt gain. Denies having any dizziness but does have episodes of fatigue "when I do strenuous activity". Pt provided no specific reflux, dysphagia or gastroparesis symptoms. Pt is currently scheduled to see Alcide Evener, NP 04/08/21 @ 130pm. However, I am not convinced his symptoms are GI related but rather suspect possibly cardiovascular (suspect possible AAA). Routing this message to Dr. Leonides Schanz for recommendations to determine if it is most appropriate for pt to be seen and evaluated by PCP rather than GI. ?

## 2021-04-06 NOTE — Telephone Encounter (Signed)
Called pt to inform about Dr. Derek Mound recommendations. LVM requesting returned call. ?

## 2021-04-07 ENCOUNTER — Other Ambulatory Visit: Payer: Self-pay

## 2021-04-07 ENCOUNTER — Encounter: Payer: Self-pay | Admitting: Cardiology

## 2021-04-07 ENCOUNTER — Ambulatory Visit (INDEPENDENT_AMBULATORY_CARE_PROVIDER_SITE_OTHER): Payer: BC Managed Care – PPO | Admitting: Cardiology

## 2021-04-07 VITALS — BP 114/70 | HR 114 | Ht 70.0 in | Wt 210.0 lb

## 2021-04-07 DIAGNOSIS — R1013 Epigastric pain: Secondary | ICD-10-CM

## 2021-04-07 DIAGNOSIS — R0609 Other forms of dyspnea: Secondary | ICD-10-CM

## 2021-04-07 DIAGNOSIS — R0789 Other chest pain: Secondary | ICD-10-CM | POA: Diagnosis not present

## 2021-04-07 DIAGNOSIS — Z13228 Encounter for screening for other metabolic disorders: Secondary | ICD-10-CM | POA: Insufficient documentation

## 2021-04-07 DIAGNOSIS — G4733 Obstructive sleep apnea (adult) (pediatric): Secondary | ICD-10-CM

## 2021-04-07 DIAGNOSIS — I10 Essential (primary) hypertension: Secondary | ICD-10-CM | POA: Diagnosis not present

## 2021-04-07 NOTE — Telephone Encounter (Signed)
Pt returned call. Informed of Dr. Derek Mound response below. Verbalized acceptance and understanding. ?

## 2021-04-07 NOTE — Progress Notes (Unsigned)
Cardiology Office Note:    Date:  04/07/2021   ID:  Anthony Russell, DOB Aug 08, 1980, MRN [id number]  PCP:  Mackie Pai, PA-C  Cardiologist:  Jenne Campus, MD    Referring MD: Mackie Pai, [place]   Chief Complaint  Patient presents with   Abdominal Pain        Chest Pain   Shortness of Breath         History of Present Illness:    Anthony Russell is a 41 y.o. male with past medical history significant for diabetes mellitus, some anxiety with depression, essential hypertension, dyslipidemia, he is a neck smoker.  He was referred to [place] because of some atypical chest pain.  Coronary CT angio has been performed which was perfectly normal with calcium score of 0 coronary CT angio Very high negative predictive value.  He comes today to my office for follow-up still not doing well actually symptoms get worse he does have a constellation of symptoms he described to have some burning pain just in the chest that happen typically at rest.  He also described to have some pulsation he feels in his belly.  It is not beating as hard, also complained of having some leg pain and tingling in his legs.  He said when he walked he will have tightness in his calf.  A lot of very nonspecific complaints.  Past Medical History:  Diagnosis Date   Allergy    Allergy, unspecified not elsewhere classified    rx w/ OTC antihistamines PRN   Anxiety    Asthma    Atypical chest pain    recent neg eval w/ normal cxr/ekg   Depression    DM (diabetes mellitus) ([place])    Dyspepsia    on protonix 40mg /d   GERD (gastroesophageal reflux disease)    on protonix 40mg /d   HTN (hypertension)    Hyperlipidemia    on diet alone   IBS (irritable bowel syndrome)    Lumbar back pain    s/p lumbar laminectomy 2007 by DrCabbell   Migraines    Overweight(278.02)    weight Jan10=246#.[name].he was 225# in 9/07...diet and exercise was discussed   Sleep apnea    not wearing c-pap currently    Past Surgical History:   Procedure Laterality Date   DENTAL SURGERY     LUMBAR LAMINECTOMY  01/30/2005   DrCabbell   TEAR DUCT PROBING      Current Medications: Current Meds  Medication Sig   albuterol (VENTOLIN HFA) 108 (90 Base) MCG/ACT inhaler INHALE 2 PUFFS INTO THE LUNGS EVERY 6 HOURS AS NEEDED FOR WHEEZING OR SHORTNESS OF BREATH (Patient taking differently: Inhale 2 puffs into the lungs every 6 (six) hours as needed for wheezing or shortness of breath.)   atorvastatin (LIPITOR) 10 MG tablet TAKE 1 TABLET(10 MG) BY MOUTH DAILY (Patient taking differently: Take 10 mg by mouth daily.)   calcium carbonate (OS-CAL) 1250 (500 Ca) MG chewable tablet Chew 2 tablets by mouth daily.   dapagliflozin propanediol (FARXIGA) 5 MG TABS tablet Take 1 tablet (5 mg total) by mouth daily.   famotidine (PEPCID) 20 MG tablet Take 1 tablet (20 mg total) by mouth at bedtime.   fluticasone-salmeterol (ADVAIR HFA) 230-21 MCG/ACT inhaler Inhale 2 puffs into the lungs 2 (two) times daily.   glycopyrrolate (ROBINUL) 2 MG tablet TAKE 1 TABLET BY MOUTH TWICE DAILY AS NEEDED FOR ABDOMINAL DISCOMFORT (Patient taking differently: Take 1 mg by mouth 2 (two) times daily as needed (  abdominal discomfort).)   L-Arginine 500 MG CAPS Take 2 capsules by mouth 2 (two) times daily.   levocetirizine (XYZAL) 5 MG tablet Take 1 tablet (5 mg total) by mouth every evening.   metFORMIN (GLUCOPHAGE) 1000 MG tablet TAKE 1 TABLET(1000 MG) BY MOUTH TWICE DAILY WITH A MEAL (Patient taking differently: Take 1,000 mg by mouth 2 (two) times daily with a meal. TAKE 1 TABLET(1000 MG) BY MOUTH TWICE DAILY WITH A MEAL)   Multiple Vitamin (MULTIVITAMIN ADULT PO) Take 2 tablets by mouth daily.   Nutritional Supplements (PYCNOGENOL) 300-30 MG CAPS Take 1 capsule by mouth 3 (three) times daily.   pantoprazole (PROTONIX) 40 MG tablet Take 1 tablet (40 mg total) by mouth 2 (two) times daily before a meal.   propranolol (INDERAL) 20 MG tablet Take 1 tablet (20 mg total) by  mouth 2 (two) times daily.     Allergies:   Patient has no known allergies.   Social History   Socioeconomic History   Marital status: Single    Spouse name: Not on file   Number of children: 0   Years of education: Not on file   Highest education level: Not on file  Occupational History   Occupation: Gaffer  Tobacco Use   Smoking status: Former    Packs/day: 0.10    Years: 8.00    Pack years: 0.80    Types: Cigarettes    Quit date: 03/03/2011    Years since quitting: 10.1   Smokeless tobacco: Never  Vaping Use   Vaping Use: Never used  Substance and Sexual Activity   Alcohol use: Yes    Comment: once or twice a month   Drug use: No   Sexual activity: Not on file  Other Topics Concern   Not on file  Social History Narrative   3 cups caffeine a day   Social Determinants of Health   Financial Resource Strain: Not on file  Food Insecurity: Not on file  Transportation Needs: Not on file  Physical Activity: Not on file  Stress: Not on file  Social Connections: Not on file     Family History: The patient's family history includes Allergies in his brother, father, and mother; Breast cancer in his maternal aunt; Diabetes in his father and mother; Heart disease in his brother; Hyperlipidemia in his father; Irritable bowel syndrome in his brother and mother. There is no history of Colon cancer, Esophageal cancer, Rectal cancer, or Stomach cancer. ROS:   Please see the history of present illness.    All 14 point review of systems negative except as described per history of present illness  EKGs/Labs/Other Studies Reviewed:      Recent Labs: 02/01/2021: ALT 36 02/11/2021: Hemoglobin 14.7; Platelets 186 03/10/2021: BUN 15; Creatinine, Ser 0.83; Potassium 4.5; Sodium 144  Recent Lipid Panel    Component Value Date/Time   CHOL 174 04/29/2020 0838   TRIG 190.0 (H) 04/29/2020 0838   HDL 30.50 (L) 04/29/2020 0838   CHOLHDL 6 04/29/2020 0838   VLDL 38.0 04/29/2020  0838   LDLCALC 106 (H) 04/29/2020 0838   LDLDIRECT 132.0 08/14/2019 1103    Physical Exam:    VS:  BP 114/70 (BP Location: Left Arm, Patient Position: Sitting)    Pulse (!) 114    Ht 5\' 10"  (1.778 m)    Wt 210 lb (95.3 kg)    SpO2 97%    BMI 30.13 kg/m     Wt Readings from Last 3 Encounters:  04/07/21 210 lb (95.3 kg)  03/25/21 229 lb (103.9 kg)  03/11/21 230 lb (104.3 kg)     GEN:  Well nourished, well developed in no acute distress HEENT: Normal NECK: No JVD; No carotid bruits LYMPHATICS: No lymphadenopathy CARDIAC: RRR, no murmurs, no rubs, no gallops RESPIRATORY:  Clear to auscultation without rales, wheezing or rhonchi  ABDOMEN: Soft, non-tender, non-distended MUSCULOSKELETAL:  No edema; No deformity  SKIN: Warm and dry LOWER EXTREMITIES: no swelling NEUROLOGIC:  Alert and oriented x 3 PSYCHIATRIC:  Normal affect   ASSESSMENT:    1. Primary hypertension   2. Atypical chest pain   3. Dyspepsia   4. Obstructive sleep apnea syndrome   5. Dyspnea on exertion    PLAN:    In order of problems listed above:  Atypical chest pain.  He did have a coronary CT angio which was negative it is normal, he does not have any coronary artery disease his calcium score is 0 this does have high negative predictive value.  He did have gastroscopy done he tells me he was told to get gastroparesis there is some plans to do some medications for it. Dyspnea on exertion.  I will schedule him to have an echocardiogram to assess left ventricle ejection fraction. Obstructive sleep apnea: The follow-up by internal medicine team.  I will check a right ventricle size and pressure to make sure he does not develop pulmonary hypertension. Essential hypertension: Blood pressures well controlled today. I am worried that we may be dealing with some anxiety as well as some depression here.  He tells me that he did see psychotherapist before he would like that person a lot but simply was too busy to  follow-up I told him that he need to get in touch with this person and start following on the regular basis.  So far we cannot identify really clear-cut organic explanation for his symptomatology.  Therefore I am thinking maybe we dealing with psychosomatic situation.  Still I will do arterial duplex evaluation of lower extremities as well as echocardiogram to make sure were not missing any organic pathology.   Medication Adjustments/Labs and Tests Ordered: Current medicines are reviewed at length with the patient today.  Concerns regarding medicines are outlined above.  No orders of the defined types were placed in this encounter.  Medication changes: No orders of the defined types were placed in this encounter.   Signed, Park Liter, MD, [facility] 04/07/2021 2:12 PM    [place]

## 2021-04-07 NOTE — Patient Instructions (Signed)
Medication Instructions:  ?Your physician recommends that you continue on your current medications as directed. Please refer to the Current Medication list given to you today. ? ?*If you need a refill on your cardiac medications before your next appointment, please call your pharmacy* ? ? ?Lab Work: ?None ?If you have labs (blood work) drawn today and your tests are completely normal, you will receive your results only by: ?MyChart Message (if you have MyChart) OR ?A paper copy in the mail ?If you have any lab test that is abnormal or we need to change your treatment, we will call you to review the results. ? ? ?Testing/Procedures: ?Your physician has requested that you have an echocardiogram. Echocardiography is a painless test that uses sound waves to create images of your heart. It provides your doctor with information about the size and shape of your heart and how well your heart?s chambers and valves are working. This procedure takes approximately one hour. There are no restrictions for this procedure. ? ?Your physician has requested that you have a lower or upper extremity arterial duplex. This test is an ultrasound of the arteries in the legs or arms. It looks at arterial blood flow in the legs and arms. Allow one hour for Lower and Upper Arterial scans. There are no restrictions or special instructions  ? ? ?Follow-Up: ?At [facility], you and your health needs are our priority.  As part of our continuing mission to provide you with exceptional heart care, we have created designated Provider Care Teams.  These Care Teams include your primary Cardiologist (physician) and Advanced Practice Providers (APPs -  Physician Assistants and Nurse Practitioners) who all work together to provide you with the care you need, when you need it. ? ?We recommend signing up for the patient portal called "MyChart".  Sign up information is provided on this After Visit Summary.  MyChart is used to connect with patients for  Virtual Visits (Telemedicine).  Patients are able to view lab/test results, encounter notes, upcoming appointments, etc.  Non-urgent messages can be sent to your provider as well.   ?To learn more about what you can do with MyChart, go to [email or web address].   ? ?Your next appointment:   ?4 month(s) ? ?The format for your next appointment:   ?In Person ? ?Provider:   ?Gypsy Balsam, MD  ? ? ?Other Instructions ?None ? ?

## 2021-04-08 ENCOUNTER — Encounter: Payer: Self-pay | Admitting: Nurse Practitioner

## 2021-04-08 ENCOUNTER — Ambulatory Visit (INDEPENDENT_AMBULATORY_CARE_PROVIDER_SITE_OTHER): Payer: BC Managed Care – PPO | Admitting: Pulmonary Disease

## 2021-04-08 ENCOUNTER — Ambulatory Visit (INDEPENDENT_AMBULATORY_CARE_PROVIDER_SITE_OTHER): Payer: BC Managed Care – PPO | Admitting: Nurse Practitioner

## 2021-04-08 ENCOUNTER — Encounter: Payer: Self-pay | Admitting: Pulmonary Disease

## 2021-04-08 VITALS — BP 118/68 | HR 95 | Temp 97.8°F | Ht 70.0 in | Wt 210.0 lb

## 2021-04-08 VITALS — BP 110/70 | HR 101 | Ht 70.0 in | Wt 211.0 lb

## 2021-04-08 DIAGNOSIS — R634 Abnormal weight loss: Secondary | ICD-10-CM | POA: Diagnosis not present

## 2021-04-08 DIAGNOSIS — R1013 Epigastric pain: Secondary | ICD-10-CM

## 2021-04-08 DIAGNOSIS — R0609 Other forms of dyspnea: Secondary | ICD-10-CM | POA: Diagnosis not present

## 2021-04-08 MED ORDER — TRELEGY ELLIPTA 200-62.5-25 MCG/ACT IN AEPB: 1.0000 | INHALATION_SPRAY | Freq: Every day | RESPIRATORY_TRACT | 0 refills | Status: DC

## 2021-04-08 MED ORDER — DICYCLOMINE HCL 10 MG PO CAPS: 10.0000 mg | ORAL_CAPSULE | Freq: Three times a day (TID) | ORAL | 0 refills | Status: DC | PRN

## 2021-04-08 NOTE — Progress Notes (Signed)
04/08/2021 Anthony Russell [id number] 15-Jan-1981   Chief Complaint: Upper abdominal pain  History of Present Illness: Anthony Russell is a 41 year old male with a past medical history of anxiety, depression, hypertension, hyperlipidemia, diabetes mellitus type 2, sleep apnea, asthma, atypical chest pain, GERD and IBS.  He developed mid chest pain January 2023 which worsened so he presented to the ED on 02/11/2021.  A chest CT angiogram with contrast was negative, no evidence of a PE.  He was evaluated by cardiologist Dr. Agustin Cree.  A repeat EKG 03/01/2021 showed a normal sinus rhythm with nonspecific ST segment changes without evidence of acute ischemia. He subsequently underwent a coronary CT 03/14/2021 which showed stable small calcified granulomata in the lower right lower lobe consistent with prior granulomatous disease without evidence of cardiac abnormality. Dr. Agustin Cree assessed his chest pain was atypical and further GI evaluation was recommended.  He has a history of epigastric pain which started around October 2022.  He was seen in office by Nicoletta Ba 12/16/2020 with persistent epigastric pain.  At that time, Protonix was increased to twice daily and Famotidine 20 mg was continued Q HS.  He underwent an abdominal/pelvic CT scan 12/27/2020 which identified mild distal esophageal wall thickening, diverticulosis without evidence of diverticulitis.   He underwent an EGD 03/25/2021 by Dr. Lorenso Courier which identified a small amount of food residue in the stomach and duodenum and gastritis.  Biopsies of the esophagus was consistent with reflux esophagitis, biopsies of the stomach showed reactive gastropathy and minimal chronic gastritis without evidence of H. pylori and duodenal biopsies were benign without evidence of celiac disease.  He was advised to eat 5 small sized meals daily for suspected gastroparesis.   He presents to our office today for follow-up post EGD.  He continues to have  epigastric pain which radiates to his mid chest which is worse when laying flat and upon awakening in the morning.  Eating fatty/fried foods sometimes worsens his epigastric pain and other times does not.  He has intermittent nausea without vomiting.  He continues to have epigastric pain which sometimes is tingling in nature and he feels his breath moving in and out through this area.  He describes feeling a pulsation to the epigastric area at times.  He remains on Pantoprazole 40 mg daily twice daily and Famotidine 20 mg twice daily.  He drinks 1 small glass of scotch or whiskey once monthly or less.  He drinks 1 cup of coffee daily.  No NSAIDs.  He is eating smaller meals without improvement.  He has lost 18 pounds over the past 3 to 4 weeks.  No fever, sweats or chills.  He was also evaluated by pulmonologist Dr. Silas Flood regarding his chest pain which did not improved after trials with Advair and Prednisone.  PFTs were ordered but low suspicion for pulmonary etiology for his chest pain.   CBC Latest Ref Rng & Units 02/11/2021 02/01/2021 01/18/2021  WBC 4.0 - 10.5 K/uL 6.8 12.4(H) 12.4(H)  Hemoglobin 13.0 - 17.0 g/dL 14.7 15.1 15.3  Hematocrit 39.0 - 52.0 % 46.0 44.7 45.8  Platelets 150 - 400 K/uL 186 195 182    CMP Latest Ref Rng & Units 03/10/2021 02/11/2021 02/01/2021  Glucose 70 - 99 mg/dL 204(H) 109(H) 207(H)  BUN 6 - 24 mg/dL 15 16 11   Creatinine 0.76 - 1.27 mg/dL 0.83 1.05 1.03  Sodium 134 - 144 mmol/L 144 143 138  Potassium 3.5 - 5.2 mmol/L 4.5 3.9  3.6  Chloride 96 - 106 mmol/L 104 103 102  CO2 20 - 29 mmol/L 26 31 23   Calcium 8.7 - 10.2 mg/dL 9.7 9.9 9.0  Total Protein 6.5 - 8.1 g/dL - - 7.4  Total Bilirubin 0.3 - 1.2 mg/dL - - 0.4  Alkaline Phos 38 - 126 U/L - - 54  AST 15 - 41 U/L - - 27  ALT 0 - 44 U/L - - 36     EGD 03/25/2021 Dr. Lorenso Courier: Normal esophagus. Biopsied. - Gastritis. Biopsied. - A small amount of food (residue) in the stomach. - Retained food in the duodenum. -  Biopsies were taken with a cold forceps for histology in the duodenal bulb and in the second portion of the duodenum. 1. Surgical [P], duodenal BENIGN DUODENAL MUCOSA WITH NO DIAGNOSTIC ABNORMALITY 2. Surgical [P], gastric REACTIVE GASTROPATHY AND MINIMAL CHRONIC GASTRITIS NEGATIVE FOR H. PYLORI, INTESTINAL METAPLASIA, DYSPLASIA AND CARCINOMA 3. Surgical [P], esophagus REACTIVE SQUAMOUS MUCOSA WITH CHANGES COMPATIBLE WITH REFLUX ESOPHAGITIS (NO EOSINOPHILS IDENTIFIED  Current Outpatient Medications on File Prior to Visit  Medication Sig Dispense Refill   albuterol (VENTOLIN HFA) 108 (90 Base) MCG/ACT inhaler INHALE 2 PUFFS INTO THE LUNGS EVERY 6 HOURS AS NEEDED FOR WHEEZING OR SHORTNESS OF BREATH (Patient taking differently: Inhale 2 puffs into the lungs every 6 (six) hours as needed for wheezing or shortness of breath.) 20.1 g 0   atorvastatin (LIPITOR) 10 MG tablet TAKE 1 TABLET(10 MG) BY MOUTH DAILY (Patient taking differently: Take 10 mg by mouth daily.) 90 tablet 3   calcium carbonate (OS-CAL) 1250 (500 Ca) MG chewable tablet Chew 2 tablets by mouth daily.     dapagliflozin propanediol (FARXIGA) 5 MG TABS tablet Take 1 tablet (5 mg total) by mouth daily. 30 tablet 3   famotidine (PEPCID) 20 MG tablet Take 1 tablet (20 mg total) by mouth at bedtime. 30 tablet 3   fluticasone-salmeterol (ADVAIR HFA) 230-21 MCG/ACT inhaler Inhale 2 puffs into the lungs 2 (two) times daily. 1 each 3   glycopyrrolate (ROBINUL) 2 MG tablet TAKE 1 TABLET BY MOUTH TWICE DAILY AS NEEDED FOR ABDOMINAL DISCOMFORT (Patient taking differently: Take 1 mg by mouth 2 (two) times daily as needed (abdominal discomfort).) 60 tablet 2   L-Arginine 500 MG CAPS Take 2 capsules by mouth 2 (two) times daily.     levocetirizine (XYZAL) 5 MG tablet Take 1 tablet (5 mg total) by mouth every evening. 30 tablet 3   metFORMIN (GLUCOPHAGE) 1000 MG tablet TAKE 1 TABLET(1000 MG) BY MOUTH TWICE DAILY WITH A MEAL (Patient taking  differently: Take 1,000 mg by mouth 2 (two) times daily with a meal. TAKE 1 TABLET(1000 MG) BY MOUTH TWICE DAILY WITH A MEAL) 180 tablet 1   Multiple Vitamin (MULTIVITAMIN ADULT PO) Take 2 tablets by mouth daily.     Nutritional Supplements (PYCNOGENOL) 300-30 MG CAPS Take 1 capsule by mouth 3 (three) times daily.     pantoprazole (PROTONIX) 40 MG tablet Take 1 tablet (40 mg total) by mouth 2 (two) times daily before a meal. 60 tablet 6   propranolol (INDERAL) 20 MG tablet Take 1 tablet (20 mg total) by mouth 2 (two) times daily. 60 tablet 3   No current facility-administered medications on file prior to visit.   No Known Allergies  Current Medications, Allergies, Past Medical History, Past Surgical History, Family History and Social History were reviewed in [employer] record.  Review of Systems:   Constitutional: See HPI Respiratory:  Negative for shortness of breath.   Cardiovascular: See HPI. Gastrointestinal: See HPI.  Musculoskeletal: Negative for back pain or muscle aches.  Neurological: Negative for dizziness, headaches or paresthesias.   Physical Exam: BP 110/70    Pulse (!) 101    Ht 5\' 10"  (1.778 m)    Wt 211 lb (95.7 kg)    SpO2 98%    BMI 30.28 kg/m   Wt Readings from Last 3 Encounters:  04/08/21 211 lb (95.7 kg)  04/07/21 210 lb (95.3 kg)  03/25/21 229 lb (103.9 kg)    General: 41 year old male in no acute distress. Head: Normocephalic and atraumatic. Eyes: No scleral icterus. Conjunctiva pink . Ears: Normal auditory acuity. Mouth: Dentition intact. No ulcers or lesions.  Lungs: Clear throughout to auscultation. Heart: Regular rate and rhythm, no murmur. Abdomen: Soft, nondistended.  Mild epigastric tenderness without rebound or guarding.  No masses or hepatomegaly. Normal bowel sounds x 4 quadrants.  No abdominal bruit appreciated. Rectal: Furred. Musculoskeletal: Symmetrical with no gross deformities. Extremities: No edema. Neurological:  Alert oriented x 4. No focal deficits.  Psychological: Alert and cooperative. Normal mood and affect  Assessment and Recommendations:  8) 41 year old male with atypical chest pain and intermittent epigastric pain. CTAP 11/2020 showed mild distal esophageal wall thickening and diverticulosis. EGD 03/25/2021 showed evidence of mild reflux.  Cardiac evaluation including EKG and coronary CT without evidence of coronary artery disease.  No improvement after Advair and Prednisone, PFTs ordered per pulmonologist Dr. Silas Flood. -Abdominal ultrasound to further evaluate his gallbladder and CBD -CCK HIDA scan if abdominal sonogram negative -Esophageal manometry if gallbladder evaluation unrevealing -Continue Pantoprazole 40 mg twice daily and Famotidine 20 mg p.o. twice daily for now -Dicyclomine 10 mg 1 p.o. twice daily as needed abdominal pain  2) Nausea.  EGD 03/25/2021 showed mild chronic gastritis, no evidence of H. pylori.  A small amount of retained food was in the stomach and duodenum suggestive of gastroparesis. -Consider gastric emptying study to further rule out diabetic gastroparesis verses empirically treat with Reglan, I will discuss further with Dr. Lorenso Courier -Four small snacks sized meals recommended  3) Weight loss, secondary to nausea and epigastric pain. 18 lb weight loss over the past 3 to 4 weeks.  -See plan in #1 and #2  4) DM II  5) Coronary CT showed evidence of small calcified granulomata in the right lower lung lobe, no history of sarcoid or TB

## 2021-04-08 NOTE — Progress Notes (Signed)
_0  ID: Anthony Russell, male    DOB: 1980/03/06, 41 y.o.   MRN: [id number]  Chief Complaint  Patient presents with   Follow-up    Pt states when he gets up to move around he feels in the center of Anthony Russell chest he can feel an odd sensation/tingling feeling moving. When he moves or bends over he can feel it move up and down. He feels like a pulsed feeling.     Referring provider: Elise Benne  HPI:   Anthony y.o. whom we are seeing in follow-up for chest/upper abdominal discomfort and asthma.  Recent GI note reviewed.  Recent EGD reviewed.  No real change in symptoms since last visit.  Had EGD.  This showed gastritis.  Change of Symbicort to Advair without improvement in symptoms.  Chief complaint is abdominal discomfort.  Has some heaviness or tightness in Anthony Russell chest when he walks sometimes.  He has a history of asthma.  He has not improved with Advair.  Trial of prednisone 20 mg daily for 5 days and this did not improve symptoms either.  Discussed at length given description of symptoms, tenderness to palpation on exam, that this is unlikely to represent an abnormality in the lung.  Suspect this is related to GI tract.  HPI at initial visit: Notes onset of upper belly pain, low chest pain several months ago.  Seen by GI.  Increasing GERD medications without improvement.  CT abdomen pelvis ordered which was most notable for esophagitis.  Tried sulcal fate without improvement but did increase Anthony Russell sugars.  Unfortunate, developed epiglottitis and upper airway infection 12/2020.  Got dose dexamethasone.  Improved swelling.  Antibiotics prescribed also help.  Since then he describes a chest pressure discomfort.  Points, substernal.  Worse when he lies supine.  No exertional component.  No dyspnea.  Little bit worse when sitting up compared to standing up.  No other alleviating or exacerbating factors, positional changes, time of day, seasonal or environmental factors he can apply to make things  better or worse.  He continues on high-dose Symbicort.  This does not help at all.  Seems to make things worse.  Albuterol provides some mild relief over the course of a couple of hours.  Review chest x-ray 02/11/2021 that on my review interpretation reveals clear lungs bilaterally.  CTA PE protocol from same date reviewed which shows clear lungs, no abnormality on my review and interpretation.  PMH: Hypertension, asthma, diabetes, hyperlipidemia, headaches, sleep apnea Surgical history: Neck surgery 2007  family history: Allergies, CAD in first relatives Social history: Former smoker, quit 2014, 1 pack year history, lives in [place] / Pulmonary Flowsheets:   ACT:  No flowsheet data found.  MMRC: No flowsheet data found.  Epworth:  Results of the Epworth flowsheet 11/03/2015  Sitting and reading 2  Watching TV 2  Sitting, inactive in a public place (e.g. a theatre or a meeting) 2  As a passenger in a car for an hour without a break 2  Lying down to rest in the afternoon when circumstances permit 3  Sitting and talking to someone 1  Sitting quietly after a lunch without alcohol 2  In a car, while stopped for a few minutes in traffic 2  Total score 16    Tests:   FENO:  No results found for: NITRICOXIDE  PFT: No flowsheet data found.  WALK:  No flowsheet data found.  Imaging: Personally reviewed and as per EMR discussion  this note CT CORONARY MORPH W/CTA COR W/SCORE W/CA W/CM &/OR WO/CM  Addendum Date: 03/14/2021   ADDENDUM REPORT: 03/14/2021 16:49 EXAM: OVER-READ INTERPRETATION  CT CHEST The following report is an over-read performed by radiologist Dr. Barnetta Hammersmith [facility] on 03/14/2021. This over-read does not include interpretation of cardiac or coronary anatomy or pathology. The coronary CTA interpretation by the cardiologist is attached. COMPARISON:  CTA of the chest on 02/11/2021 FINDINGS: No significant noncardiac vascular  findings. Visualized mediastinum and hilar regions demonstrate no lymphadenopathy or masses. Stable small calcified granulomata in the right lower lobe. Visualized lungs show no evidence of pulmonary edema, consolidation, pneumothorax or pleural fluid. Visualized upper abdomen and bony structures are unremarkable. IMPRESSION: Stable small calcified granulomata in the right lower lobe are consistent with prior granulomatous disease. Electronically Signed   By: Aletta Edouard M.D.   On: 03/14/2021 16:49   Result Date: 03/14/2021 HISTORY: 41 yo male with chest pain, nonspecific EXAM: Cardiac/Coronary CTA TECHNIQUE: The patient was scanned on a [employer]. PROTOCOL: A 110 kV prospective scan was triggered in the descending thoracic aorta at 111 HU's. Axial non-contrast 3 mm slices were carried out through the heart. The data set was analyzed on a dedicated work station and scored using the [place]. Gantry rotation speed was 250 msecs and collimation was .6 mm. Beta blockade and 0.8 mg of sl NTG was given. The 3D data set was reconstructed in 5% intervals of the 35-75 % of the R-R cycle. Diastolic phases were analyzed on a dedicated work station using MPR, MIP and VRT modes. The patient received of contrast. FINDINGS: Quality: Fair, HR 82 Coronary calcium score: The patient's coronary artery calcium score is 0, which places the patient in the 0 percentile. Coronary arteries: Normal coronary origins.  Right dominance. Right Coronary Artery: Dominant.  Normal artery. Left Main Coronary Artery: Normal. Bifurcates into the LAD and LCx arteries. Left Anterior Descending Coronary Artery: Anterior vessel that wraps around the apex. No disease. 2 moderate-sized diagonal branches without disease. Left Circumflex Artery: Normal AV groove vessel. High OM1 branch without disease. Aorta: Normal size, 30 mm at the mid ascending aorta (level of the PA bifurcation) measured double oblique. No calcifications. No  dissection. Aortic Valve: Trileaflet. No calcifications. Other findings: Normal pulmonary vein drainage into the left atrium. Normal left atrial appendage without a thrombus. Normal size of the pulmonary artery. IMPRESSION: 1. No evidence of CAD, CADRADS = 0. 2. Coronary calcium score of 0. This was 0 percentile for age and sex matched control. 3. Normal coronary origin with right dominance. 4. Consider non-coronary causes of chest pain Electronically Signed: By: Pixie Casino M.D. On: 03/14/2021 15:44    Lab Results: Personally reviewed, no anemia, no significant elevation in eosinophils CBC    Component Value Date/Time   WBC 6.8 02/11/2021 1010   RBC 5.12 02/11/2021 1010   HGB 14.7 02/11/2021 1010   HCT 46.0 02/11/2021 1010   PLT 186 02/11/2021 1010   MCV 89.8 02/11/2021 1010   MCH 28.7 02/11/2021 1010   MCHC 32.0 02/11/2021 1010   RDW 13.9 02/11/2021 1010   LYMPHSABS 1.4 02/01/2021 1500   MONOABS 0.5 02/01/2021 1500   EOSABS 0.0 02/01/2021 1500   BASOSABS 0.0 02/01/2021 1500    BMET    Component Value Date/Time   NA 144 03/10/2021 0910   K 4.5 03/10/2021 0910   CL 104 03/10/2021 0910   CO2 26 03/10/2021 0910   GLUCOSE 204 (H)  03/10/2021 0910   GLUCOSE 109 (H) 02/11/2021 1010   BUN 15 03/10/2021 0910   CREATININE 0.83 03/10/2021 0910   CALCIUM 9.7 03/10/2021 0910   GFRNONAA >60 02/11/2021 1010   GFRAA >60 03/29/2018 1706    BNP No results found for: BNP  ProBNP No results found for: PROBNP  Specialty Problems       Pulmonary Problems   Reactive airways dysfunction syndrome (HCC)   Allergic rhinitis   Asthma in adult   Acute bacterial bronchitis   Asymmetric tonsils    Last Assessment & Plan:  Formatting of this note might be different from the original. Concern over tonsil asymmetry. Noted on recent imaging of the head.  Denies any symptoms.  Smoked in the distant past. EXAM shows relatively symmetric appearing tonsils.  Both are soft on palpation.  No  adenopathy in the neck. PLAN: Reassured all looks okay.  No further treatment or evaluation necessary.      Laryngitis   Nasal septal deviation   Sleep apnea   Dyspnea on exertion    No Known Allergies  Immunization History  Administered Date(s) Administered   DTP 11/12/1980, 01/18/1981   Hepatitis B 10/12/1995, 11/15/1995, 05/02/1996   Influenza,inj,Quad PF,6+ Mos 11/16/2020   MMR Anthony/04/1981, 11/15/1995   PFIZER(Purple Top)SARS-COV-2 Vaccination 04/26/2019, 05/18/2019, 01/01/2020   Pneumococcal Polysaccharide-23 04/05/2020   Tdap 08/13/2012    Past Medical History:  Diagnosis Date   Allergy    Allergy, unspecified not elsewhere classified    rx w/ OTC antihistamines PRN   Anxiety    Asthma    Atypical chest pain    recent neg eval w/ normal cxr/ekg   Depression    DM (diabetes mellitus) ([place])    Dyspepsia    on protonix 74m/d   GERD (gastroesophageal reflux disease)    on protonix 434md   HTN (hypertension)    Hyperlipidemia    on diet alone   IBS (irritable bowel syndrome)    Lumbar back pain    s/p lumbar laminectomy 2007 by DrCabbell   Migraines    Overweight(278.02)    weight Jan10=246#...Marland Kitchene was 225# in 9/07...diet and exercise was discussed   Sleep apnea    not wearing c-pap currently    Tobacco History: Social History   Tobacco Use  Smoking Status Former   Packs/day: 0.10   Years: 8.00   Pack years: 0.80   Types: Cigarettes   Quit date: 03/03/2011   Years since quitting: 10.1  Smokeless Tobacco Never   Counseling given: Not Answered   Continue to not smoke  Outpatient Encounter Medications as of 04/08/2021  Medication Sig   albuterol (VENTOLIN HFA) 108 (90 Base) MCG/ACT inhaler INHALE 2 PUFFS INTO THE LUNGS EVERY 6 HOURS AS NEEDED FOR WHEEZING OR SHORTNESS OF BREATH (Patient taking differently: Inhale 2 puffs into the lungs every 6 (six) hours as needed for wheezing or shortness of breath.)   atorvastatin (LIPITOR) 10 MG tablet TAKE 1  TABLET(10 MG) BY MOUTH DAILY (Patient taking differently: Take 10 mg by mouth daily.)   calcium carbonate (OS-CAL) 1250 (500 Ca) MG chewable tablet Chew 2 tablets by mouth daily.   dapagliflozin propanediol (FARXIGA) 5 MG TABS tablet Take 1 tablet (5 mg total) by mouth daily.   dicyclomine (BENTYL) 10 MG capsule Take 1 capsule (10 mg total) by mouth 3 (three) times daily as needed for spasms (abdominal pain).   famotidine (PEPCID) 20 MG tablet Take 1 tablet (20 mg total) by mouth at bedtime.  fluticasone-salmeterol (ADVAIR HFA) 230-21 MCG/ACT inhaler Inhale 2 puffs into the lungs 2 (two) times daily.   glycopyrrolate (ROBINUL) 2 MG tablet TAKE 1 TABLET BY MOUTH TWICE DAILY AS NEEDED FOR ABDOMINAL DISCOMFORT (Patient taking differently: Take 1 mg by mouth 2 (two) times daily as needed (abdominal discomfort).)   L-Arginine 500 MG CAPS Take 2 capsules by mouth 2 (two) times daily.   levocetirizine (XYZAL) 5 MG tablet Take 1 tablet (5 mg total) by mouth every evening.   metFORMIN (GLUCOPHAGE) 1000 MG tablet TAKE 1 TABLET(1000 MG) BY MOUTH TWICE DAILY WITH A MEAL (Patient taking differently: Take 1,000 mg by mouth 2 (two) times daily with a meal. TAKE 1 TABLET(1000 MG) BY MOUTH TWICE DAILY WITH A MEAL)   Multiple Vitamin (MULTIVITAMIN ADULT PO) Take 2 tablets by mouth daily.   Nutritional Supplements (PYCNOGENOL) 300-30 MG CAPS Take 1 capsule by mouth 3 (three) times daily.   pantoprazole (PROTONIX) 40 MG tablet Take 1 tablet (40 mg total) by mouth 2 (two) times daily before a meal.   propranolol (INDERAL) 20 MG tablet Take 1 tablet (20 mg total) by mouth 2 (two) times daily.   No facility-administered encounter medications on file as of 04/08/2021.     Review of Systems  Review of Systems  N/a Physical Exam  BP 118/68 (BP Location: Left Arm, Patient Position: Sitting, Cuff Size: Normal)    Pulse 95    Temp 97.8 F (36.6 C) (Oral)    Ht _0  (1.778 m)    Wt 210 lb (95.3 kg)    SpO2 97%    BMI  30.13 kg/m   Wt Readings from Last 5 Encounters:  04/08/21 210 lb (95.3 kg)  04/08/21 211 lb (95.7 kg)  04/07/21 210 lb (95.3 kg)  03/25/21 229 lb (103.9 kg)  03/11/21 230 lb (104.3 kg)    BMI Readings from Last 5 Encounters:  04/08/21 30.13 kg/m  04/08/21 30.28 kg/m  04/07/21 30.13 kg/m  03/25/21 32.86 kg/m  03/11/21 33.00 kg/m     Physical Exam General: Sitting in chair, no acute distress Eyes: EOMI, icterus Neck: Supple, no JVP Pulmonary: Clear, no work of breathing, good air movement Cardiovascular: Regular rhythm, no murmur Abdomen: Tender to palpation epigastric area MSK: No synovitis, joint effusion Neuro: Normal gait, no weakness Psych: Normal mood, flat affect   Assessment & Plan:   Chest discomfort/tightness: Pressure, tightness worse sitting down or lying supine.  Suspect most related to GERD and esophagitis seen on imaging Anthony/28/2022.  No real improvement with Symbicort.  Slight improvement with albuterol.  No real improvement with change to high-dose Advair.  Did not improve with prednisone.  EGD with gastritis.  Anthony Russell abdomen is tender to palpation.  Suspect this is more referred pain from the abdomen.  Asthma: Overall seems well controlled.  Dyspnea minimal.  Symptoms as above.  Stop Advair HFA.  Trial high-dose Trelegy DPI daily, new prescription today.  Continue albuterol as needed. Empiric course of prednisone did not improve symptoms.  Do not think Anthony Russell symptoms are related to poorly controlled asthma.  PFTs ordered for further evaluation.  If Trelegy is not helpful we will discontinue and this will exhaust all options to treat possible uncontrolled asthma and further reinforce this is likely a GI issue.  Return in about 2 months (around 06/08/2021).   Lanier Clam, MD 04/08/2021

## 2021-04-08 NOTE — Addendum Note (Signed)
Addended by: Lavetta Nielsen L on: 04/08/2021 04:40 PM ? ? Modules accepted: Orders ? ?

## 2021-04-08 NOTE — Patient Instructions (Signed)
Nice to see you again ? ?I provided samples for Trelegy 1 puff once a day.  Rinse your mouth with water after every use. ? ?Stop the Advair once you start this new medication. ? ?If the Trelegy is helpful, let me know and I will prescribe this. ? ?I think the symptoms are related to GI issues, esophagus and stomach.  I will message your GI doctor to further discuss. ? ?Return to clinic in 2 months or sooner as needed ?

## 2021-04-08 NOTE — Patient Instructions (Addendum)
You will be contacted by [facility] Scheduling (Your caller ID will indicate phone # [phone]) in the next 7 days to schedule your Abdominal Ultrasound. If you have not heard from them within 7 business days, please call [facility] Scheduling at [phone] to follow up on the status of your appointment.   ? ?RECOMMENDATIONS: ? ?Dicyclomine 10 MG, take 1 capsule up to three times a day as needed for abdominal pain. ?Eat small snack sized meals daily. ?Ensure one can daily. ?Please contact our office if your symptoms worsen. ? ?Thank you for trusting me with your gastrointestinal care!   ? ?Arnaldo Natal, CRNP ? ? ? ?BMI: ? ?If you are age 31 or older, your body mass index should be between 23-30. Your Body mass index is 30.28 kg/m?[name] If this is out of the aforementioned range listed, please consider follow up with your Primary Care Provider. ? ?If you are age 69 or younger, your body mass index should be between 19-25. Your Body mass index is 30.28 kg/m?[name] If this is out of the aformentioned range listed, please consider follow up with your Primary Care Provider.  ? ?MY CHART: ? ?The [facility] providers would like to encourage you to use [facility] to communicate with providers for non-urgent requests or questions.  Due to long hold times on the telephone, sending your provider a message by [facility] may be a faster and more efficient way to get a response.  Please allow 48 business hours for a response.  Please remember that this is for non-urgent requests.  ? ?

## 2021-04-08 NOTE — Progress Notes (Signed)
RADIOLOGY SCHEDULING REQUEST FOR Abdominal US sent to [facility] Scheduling via secure staff message. ?Reminder set to ensure appointment gets scheduled. ? ? ? ?

## 2021-04-11 DIAGNOSIS — F4323 Adjustment disorder with mixed anxiety and depressed mood: Secondary | ICD-10-CM | POA: Diagnosis not present

## 2021-04-11 NOTE — Progress Notes (Signed)
[place] scheduled 04/18/21. Will await results ?

## 2021-04-13 ENCOUNTER — Other Ambulatory Visit: Payer: Self-pay | Admitting: Cardiology

## 2021-04-13 DIAGNOSIS — G4733 Obstructive sleep apnea (adult) (pediatric): Secondary | ICD-10-CM

## 2021-04-13 DIAGNOSIS — R1013 Epigastric pain: Secondary | ICD-10-CM

## 2021-04-13 DIAGNOSIS — R0789 Other chest pain: Secondary | ICD-10-CM

## 2021-04-13 DIAGNOSIS — I739 Peripheral vascular disease, unspecified: Secondary | ICD-10-CM

## 2021-04-13 DIAGNOSIS — R0609 Other forms of dyspnea: Secondary | ICD-10-CM

## 2021-04-15 ENCOUNTER — Ambulatory Visit (HOSPITAL_BASED_OUTPATIENT_CLINIC_OR_DEPARTMENT_OTHER)
Admission: RE | Admit: 2021-04-15 | Discharge: 2021-04-15 | Disposition: A | Discharge status: Home / Self Care | Payer: BC Managed Care – PPO | Source: Ambulatory Visit | Attending: Cardiology | Admitting: Cardiology

## 2021-04-15 ENCOUNTER — Other Ambulatory Visit: Payer: Self-pay

## 2021-04-15 DIAGNOSIS — I739 Peripheral vascular disease, unspecified: Secondary | ICD-10-CM | POA: Insufficient documentation

## 2021-04-15 DIAGNOSIS — R0789 Other chest pain: Secondary | ICD-10-CM | POA: Diagnosis not present

## 2021-04-15 DIAGNOSIS — R0609 Other forms of dyspnea: Secondary | ICD-10-CM

## 2021-04-15 DIAGNOSIS — R1013 Epigastric pain: Secondary | ICD-10-CM | POA: Diagnosis not present

## 2021-04-15 DIAGNOSIS — G4733 Obstructive sleep apnea (adult) (pediatric): Secondary | ICD-10-CM | POA: Diagnosis not present

## 2021-04-18 ENCOUNTER — Ambulatory Visit (HOSPITAL_COMMUNITY)
Admission: RE | Admit: 2021-04-18 | Discharge: 2021-04-18 | Disposition: A | Discharge status: Home / Self Care | Payer: BC Managed Care – PPO | Source: Ambulatory Visit | Attending: Nurse Practitioner | Admitting: Nurse Practitioner

## 2021-04-18 ENCOUNTER — Other Ambulatory Visit: Payer: Self-pay

## 2021-04-18 ENCOUNTER — Encounter: Payer: Self-pay | Admitting: Medical

## 2021-04-18 DIAGNOSIS — R634 Abnormal weight loss: Secondary | ICD-10-CM | POA: Diagnosis not present

## 2021-04-18 DIAGNOSIS — R1013 Epigastric pain: Secondary | ICD-10-CM | POA: Insufficient documentation
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
## 2021-04-18 NOTE — Telephone Encounter (Signed)
Dr. Judeth Horn, the pt is agreeable to a repeat sleep study. Please confirm if you would like a HST and we can order. Thanks! ?

## 2021-04-19 ENCOUNTER — Ambulatory Visit (HOSPITAL_BASED_OUTPATIENT_CLINIC_OR_DEPARTMENT_OTHER)
Admission: RE | Admit: 2021-04-19 | Discharge: 2021-04-19 | Disposition: A | Discharge status: Home / Self Care | Payer: BC Managed Care – PPO | Source: Ambulatory Visit | Attending: Cardiology | Admitting: Cardiology

## 2021-04-19 DIAGNOSIS — G4733 Obstructive sleep apnea (adult) (pediatric): Secondary | ICD-10-CM | POA: Insufficient documentation

## 2021-04-19 DIAGNOSIS — R0609 Other forms of dyspnea: Secondary | ICD-10-CM | POA: Insufficient documentation

## 2021-04-19 DIAGNOSIS — R1013 Epigastric pain: Secondary | ICD-10-CM | POA: Insufficient documentation

## 2021-04-19 DIAGNOSIS — I10 Essential (primary) hypertension: Secondary | ICD-10-CM | POA: Insufficient documentation

## 2021-04-19 DIAGNOSIS — R0789 Other chest pain: Secondary | ICD-10-CM | POA: Insufficient documentation

## 2021-04-19 LAB — ECHOCARDIOGRAM COMPLETE
AR max vel: 3.14 cm2
AV Area VTI: 3.08 cm2
AV Area mean vel: 3.11 cm2
AV Mean grad: 5 mmHg
AV Peak grad: 8.6 mmHg
Ao pk vel: 1.47 m/s
Area-P 1/2: 3.74 cm2
S' Lateral: 2.5 cm

## 2021-04-19 NOTE — Progress Notes (Signed)
?  Echocardiogram ?2D Echocardiogram has been performed. ? ?Elmer Ramp ?04/19/2021, 1:47 PM ?

## 2021-04-19 NOTE — Telephone Encounter (Signed)
Please order home sleep test. Thanks!

## 2021-04-22 ENCOUNTER — Ambulatory Visit: Payer: BC Managed Care – PPO | Admitting: Cardiology

## 2021-04-24 NOTE — Progress Notes (Signed)
I agree with assessment and plan as outlined by Ms. Berniece Pap. Okay to get gastric emptying study to evaluate for gastroparesis. ?

## 2021-04-25 DIAGNOSIS — F4323 Adjustment disorder with mixed anxiety and depressed mood: Secondary | ICD-10-CM | POA: Diagnosis not present

## 2021-04-26 ENCOUNTER — Encounter: Payer: BC Managed Care – PPO | Admitting: Internal Medicine

## 2021-04-27 ENCOUNTER — Ambulatory Visit: Payer: BC Managed Care – PPO | Admitting: Internal Medicine

## 2021-05-04 ENCOUNTER — Ambulatory Visit: Payer: BC Managed Care – PPO | Admitting: Internal Medicine

## 2021-05-05 ENCOUNTER — Ambulatory Visit: Payer: BC Managed Care – PPO | Admitting: Internal Medicine

## 2021-05-05 ENCOUNTER — Ambulatory Visit: Payer: BC Managed Care – PPO | Admitting: Cardiology

## 2021-05-06 ENCOUNTER — Other Ambulatory Visit: Payer: Self-pay | Admitting: Medical

## 2021-05-16 ENCOUNTER — Ambulatory Visit: Payer: BC Managed Care – PPO | Admitting: Pulmonary Disease

## 2021-05-16 DIAGNOSIS — F4323 Adjustment disorder with mixed anxiety and depressed mood: Secondary | ICD-10-CM | POA: Diagnosis not present

## 2021-05-27 ENCOUNTER — Encounter: Payer: Self-pay | Admitting: *Deleted

## 2021-05-30 DIAGNOSIS — F4323 Adjustment disorder with mixed anxiety and depressed mood: Secondary | ICD-10-CM | POA: Diagnosis not present

## 2021-05-31 ENCOUNTER — Encounter: Payer: Self-pay | Admitting: Internal Medicine

## 2021-05-31 ENCOUNTER — Ambulatory Visit (INDEPENDENT_AMBULATORY_CARE_PROVIDER_SITE_OTHER): Payer: BC Managed Care – PPO | Admitting: Internal Medicine

## 2021-05-31 VITALS — BP 102/60 | HR 88 | Ht 70.0 in | Wt 202.0 lb

## 2021-05-31 DIAGNOSIS — K219 Gastro-esophageal reflux disease without esophagitis: Secondary | ICD-10-CM

## 2021-05-31 DIAGNOSIS — B370 Candidal stomatitis: Secondary | ICD-10-CM | POA: Diagnosis not present

## 2021-05-31 DIAGNOSIS — R1013 Epigastric pain: Secondary | ICD-10-CM

## 2021-05-31 MED ORDER — BACLOFEN 5 MG PO TABS: 1.0000 | ORAL_TABLET | Freq: Two times a day (BID) | ORAL | 1 refills | Status: DC

## 2021-05-31 MED ORDER — NYSTATIN 100000 UNIT/ML MT SUSP: 5.0000 mL | Freq: Four times a day (QID) | OROMUCOSAL | 0 refills | Status: AC

## 2021-05-31 NOTE — Progress Notes (Signed)
? ? ?05/31/2021 ?Anthony Russell ?[id number] ?Jun 03, 1980 ? ? ?Chief Complaint: Upper abdominal pain ? ?History of Present Illness: Anthony Russell is a 41 year old male with a past medical history of anxiety, depression, hypertension, hyperlipidemia, diabetes mellitus type 2, sleep apnea, asthma, atypical chest pain, GERD and IBS. ? ?Interval History: Patient has still been having epigastric ab pain. He wakes up with acid in his throat. He tried a bland diet and has cut back on the amount he has been eating. He lost weight as a result of his changes, but he has continued to have symptoms. ? ? ? ?  Latest Ref Rng & Units 02/11/2021  ? 10:10 AM 02/01/2021  ?  3:00 PM 01/18/2021  ? 11:02 AM  ?CBC  ?WBC 4.0 - 10.5 K/uL 6.8   12.4   12.4    ?Hemoglobin 13.0 - 17.0 g/dL 46.6   59.9   35.7    ?Hematocrit 39.0 - 52.0 % 46.0   44.7   45.8    ?Platelets 150 - 400 K/uL 186   195   182    ?  ? ?  Latest Ref Rng & Units 03/10/2021  ?  9:10 AM 02/11/2021  ? 10:10 AM 02/01/2021  ?  3:00 PM  ?CMP  ?Glucose 70 - 99 mg/dL 017   793   903    ?BUN 6 - 24 mg/dL 15   16   11     ?Creatinine 0.76 - 1.27 mg/dL   0.09   2.33    ?Sodium 134 - 144 mmol/L 144   143   138    ?Potassium 3.5 - 5.2 mmol/L 4.5   3.9   3.6    ?Chloride 96 - 106 mmol/L 104   103   102    ?CO2 20 - 29 mmol/L 26   31   23     ?Calcium 8.7 - 10.2 mg/dL 9.7   9.9   9.0    ?Total Protein 6.5 - 8.1 g/dL   7.4    ?Total Bilirubin 0.3 - 1.2 mg/dL   0.4    ?Alkaline Phos 38 - 126 U/L   54    ?AST 15 - 41 U/L   27    ?ALT 0 - 44 U/L   36    ?  ? ?EGD 03/25/2021 Dr. : ?Normal esophagus. Biopsied. ?- Gastritis. Biopsied. ?- A small amount of food (residue) in the stomach. ?- Retained food in the duodenum. ?- Biopsies were taken with a cold forceps for histology in the duodenal bulb and in the second ?portion of the duodenum. ?1. Surgical [P], duodenal ?BENIGN DUODENAL MUCOSA WITH NO DIAGNOSTIC ABNORMALITY ?2. Surgical [P], gastric ?REACTIVE GASTROPATHY AND MINIMAL CHRONIC  GASTRITIS ?NEGATIVE FOR H. PYLORI, INTESTINAL METAPLASIA, DYSPLASIA AND CARCINOMA ?3. Surgical [P], esophagus ?REACTIVE SQUAMOUS MUCOSA WITH CHANGES COMPATIBLE WITH REFLUX ESOPHAGITIS (NO EOSINOPHILS ?IDENTIFIED ? ?Current Outpatient Medications on File Prior to Visit  ?Medication Sig Dispense Refill  ? albuterol (VENTOLIN HFA) 108 (90 Base) MCG/ACT inhaler INHALE 2 PUFFS INTO THE LUNGS EVERY 6 HOURS AS NEEDED FOR WHEEZING OR SHORTNESS OF BREATH (Patient taking differently: Inhale 2 puffs into the lungs every 6 (six) hours as needed for wheezing or shortness of breath.) 20.1 g 0  ? atorvastatin (LIPITOR) 10 MG tablet TAKE 1 TABLET(10 MG) BY MOUTH DAILY (Patient taking differently: Take 10 mg by mouth daily.) 90 tablet 3  ? calcium carbonate (OS-CAL) 1250 (500 Ca) MG chewable tablet  Chew 2 tablets by mouth daily.    ? dicyclomine (BENTYL) 10 MG capsule Take 1 capsule (10 mg total) by mouth 3 (three) times daily as needed for spasms (abdominal pain). 30 capsule 0  ? famotidine (PEPCID) 20 MG tablet Take 1 tablet (20 mg total) by mouth at bedtime. 30 tablet 3  ? FARXIGA 5 MG TABS tablet TAKE 1 TABLET(5 MG) BY MOUTH DAILY 30 tablet 3  ? fluticasone-salmeterol (ADVAIR HFA) 230-21 MCG/ACT inhaler Inhale 2 puffs into the lungs 2 (two) times daily. 1 each 3  ? Fluticasone-Umeclidin-Vilant (TRELEGY ELLIPTA) 200-62.5-25 MCG/ACT AEPB Inhale 1 puff into the lungs daily. 28 each 0  ? glycopyrrolate (ROBINUL) 2 MG tablet TAKE 1 TABLET BY MOUTH TWICE DAILY AS NEEDED FOR ABDOMINAL DISCOMFORT (Patient taking differently: Take 1 mg by mouth 2 (two) times daily as needed (abdominal discomfort).) 60 tablet 2  ? L-Arginine 500 MG CAPS Take 2 capsules by mouth 2 (two) times daily.    ? levocetirizine (XYZAL) 5 MG tablet Take 1 tablet (5 mg total) by mouth every evening. 30 tablet 3  ? metFORMIN (GLUCOPHAGE) 1000 MG tablet TAKE 1 TABLET(1000 MG) BY MOUTH TWICE DAILY WITH A MEAL (Patient taking differently: Take 1,000 mg by mouth 2 (two)  times daily with a meal. TAKE 1 TABLET(1000 MG) BY MOUTH TWICE DAILY WITH A MEAL) 180 tablet 1  ? Multiple Vitamin (MULTIVITAMIN ADULT PO) Take 2 tablets by mouth daily.    ? Nutritional Supplements (PYCNOGENOL) 300-30 MG CAPS Take 1 capsule by mouth 3 (three) times daily.    ? pantoprazole (PROTONIX) 40 MG tablet Take 1 tablet (40 mg total) by mouth 2 (two) times daily before a meal. 60 tablet 6  ? propranolol (INDERAL) 20 MG tablet Take 1 tablet (20 mg total) by mouth 2 (two) times daily. 60 tablet 3  ? ?No current facility-administered medications on file prior to visit.  ? ?Physical Exam: ?BP 102/60   Pulse 88   Ht 5\' 10"  (1.778 m)   Wt 202 lb (91.6 kg)   SpO2 98%   BMI 28.98 kg/m?  ? ?Wt Readings from Last 3 Encounters:  ?05/31/21 202 lb (91.6 kg)  ?04/08/21 210 lb (95.3 kg)  ?04/08/21 211 lb (95.7 kg)  ?  ?General: 41 year old male in no acute distress. ?Head: Normocephalic and atraumatic. ?Eyes: No scleral icterus. Conjunctiva pink . ?Ears: Normal auditory acuity. ?Mouth: Plaque seen on his tongue ?Lungs: Clear throughout to auscultation. ?Heart: Regular rate and rhythm, no murmur. ?Abdomen: Soft, nondistended.   ?Musculoskeletal: Symmetrical with no gross deformities. ?Extremities: No edema. ?Neurological: Alert oriented x 4. No focal deficits.  ?Psychological: Alert and cooperative. Normal mood and affect ? ?Assessment and Recommendations: ?Epigastric abdominal pain ?GERD ?Oral candidiasis ?Patient presentswith epigastric ab pain and GERD that has persisted. I noted a white plaque on his tongue today that is concerning for oral thrush. Thus will start him on nystatin swish and swallow. If the nystatin does not help, then asked the patient to start baclofen to see if this will help with his reflux symptoms. He has been noted on prior EGD to have retained food in the stomach. Thus will plan for GES to look for gastroparesis. ?- Continue Protonix BID and Pepcid BID PRN ?- Start Nystatin swish and swallow  for oral candidiasis ?- Start baclofen 5 mg BID ?- NM gastric emptying study ?

## 2021-05-31 NOTE — Patient Instructions (Addendum)
We have sent the following medications to your pharmacy for you to pick up at your convenience: ?Nystatin 5 ml four times daily x 14 days ?Baclofen 5 mg twice daily ?_____________________________________________________ ?You have been scheduled for a follow up with Dr Leonides Schanz on 08/03/21 @ 2:10 pm. ?_______________________________________________________ ?You have been scheduled for a gastric emptying scan at [facility] on Tuesday, 06/07/21 at 1:00 pm. Please arrive at least 15 minutes prior to your appointment for registration. Please make certain not to have anything to eat or drink after midnight the night before your test. Hold all stomach medications (ex: Zofran, phenergan, Reglan) 48 hours prior to your test. If you need to reschedule your appointment, please contact radiology scheduling at [phone]. ?_____________________________________________________ ?A gastric-emptying study measures how long it takes for food to move through your stomach. There are several ways to measure stomach emptying. In the most common test, you eat food that contains a small amount of radioactive material. A scanner that detects the movement of the radioactive material is placed over your abdomen to monitor the rate at which food leaves your stomach. This test normally takes about 4 hours to complete. ?_____________________________________________________ ?

## 2021-06-07 ENCOUNTER — Ambulatory Visit (HOSPITAL_COMMUNITY)
Admission: RE | Admit: 2021-06-07 | Discharge: 2021-06-07 | Disposition: A | Discharge status: Home / Self Care | Payer: BC Managed Care – PPO | Source: Ambulatory Visit | Attending: Internal Medicine | Admitting: Internal Medicine

## 2021-06-07 DIAGNOSIS — R1013 Epigastric pain: Secondary | ICD-10-CM | POA: Diagnosis not present

## 2021-06-07 DIAGNOSIS — B370 Candidal stomatitis: Secondary | ICD-10-CM | POA: Diagnosis not present

## 2021-06-07 DIAGNOSIS — K219 Gastro-esophageal reflux disease without esophagitis: Secondary | ICD-10-CM | POA: Insufficient documentation

## 2021-06-07 DIAGNOSIS — Z0389 Encounter for observation for other suspected diseases and conditions ruled out: Secondary | ICD-10-CM | POA: Diagnosis not present

## 2021-06-07 MED ORDER — TECHNETIUM TC 99M SULFUR COLLOID
2.1000 | Freq: Once | INTRAVENOUS | Status: AC
  Administered 2021-06-07: 2.1 via ORAL

## 2021-06-08 ENCOUNTER — Ambulatory Visit (INDEPENDENT_AMBULATORY_CARE_PROVIDER_SITE_OTHER): Payer: BC Managed Care – PPO | Admitting: Pulmonary Disease

## 2021-06-08 ENCOUNTER — Encounter: Payer: Self-pay | Admitting: Pulmonary Disease

## 2021-06-08 VITALS — BP 122/68 | HR 84 | Temp 98.2°F | Ht 70.0 in | Wt 202.2 lb

## 2021-06-08 DIAGNOSIS — K219 Gastro-esophageal reflux disease without esophagitis: Secondary | ICD-10-CM

## 2021-06-08 DIAGNOSIS — R0609 Other forms of dyspnea: Secondary | ICD-10-CM | POA: Diagnosis not present

## 2021-06-08 DIAGNOSIS — G4733 Obstructive sleep apnea (adult) (pediatric): Secondary | ICD-10-CM | POA: Diagnosis not present

## 2021-06-08 LAB — PULMONARY FUNCTION TEST
DL/VA % pred: 141 %
DL/VA: 6.61 ml/min/mmHg/L
DLCO cor % pred: 123 %
DLCO cor: 37.34 ml/min/mmHg
DLCO unc % pred: 123 %
DLCO unc: 37.34 ml/min/mmHg
FEF 25-75 Post: 1.37 L/sec
FEF 25-75 Pre: 1.51 L/sec
FEF2575-%Change-Post: -8 %
FEF2575-%Pred-Post: 35 %
FEF2575-%Pred-Pre: 38 %
FEV1-%Change-Post: -1 %
FEV1-%Pred-Post: 68 %
FEV1-%Pred-Pre: 69 %
FEV1-Post: 2.79 L
FEV1-Pre: 2.85 L
FEV1FVC-%Change-Post: 10 %
FEV1FVC-%Pred-Pre: 82 %
FEV6-%Change-Post: -11 %
FEV6-%Pred-Post: 76 %
FEV6-%Pred-Pre: 85 %
FEV6-Post: 3.81 L
FEV6-Pre: 4.3 L
FEV6FVC-%Change-Post: 0 %
FEV6FVC-%Pred-Post: 102 %
FEV6FVC-%Pred-Pre: 101 %
FVC-%Change-Post: -11 %
FVC-%Pred-Post: 74 %
FVC-%Pred-Pre: 84 %
FVC-Post: 3.82 L
FVC-Pre: 4.33 L
Post FEV1/FVC ratio: 73 %
Post FEV6/FVC ratio: 100 %
Pre FEV1/FVC ratio: 66 %
Pre FEV6/FVC Ratio: 99 %
RV % pred: 95 %
RV: 1.69 L
TLC % pred: 87 %
TLC: 5.88 L

## 2021-06-08 NOTE — Patient Instructions (Signed)
Neck to see you again ? ?It looks like we are behind on scheduling the sleep test, we have sent a message to our schedulers to find out where we are ? ?Please schedule PFTs at the front desk when you leave, at your convenience ? ?Follow-up pending results of PFT ?

## 2021-06-08 NOTE — Progress Notes (Signed)
PFT done today. 

## 2021-06-08 NOTE — Progress Notes (Signed)
? ?'@Patient'  ID: Anthony Russell, male    DOB: 06/09/1980, 41 y.o.   MRN: [id number] ? ?Chief Complaint  ?Patient presents with  ? Follow-up  ?  Pt states he is still having issues with his stomach. He states he feels pressure moving up his chest to his throat area. Pt just had a gastric function test that came back normal.   ? ? ?Referring provider: ?Saguier, Percell Miller, PA-C ? ?HPI:  ? ?41 y.o. whom we are seeing in follow-up for chest/upper abdominal discomfort and asthma.  Recent GI note reviewed.  Recent gastric emptying study reviewed. ? ?No real change in symptoms since last visit.  Feels sensation of discomfort radiating up into the chest now.  Endorses frequent morning better, reflux taste in mouth.  Recent nuclear med gastric imaging study was negative for gastroparesis which is good.  Recently put on medicine for thrush.  On exam thrush resolved.  Plan to try baclofen per GI note for abdominal symptoms once finishes baclofen.  Trelegy did not help symptoms whatsoever.  He is subsequently discontinued which I agree with.  Sleep study yet to be scheduled, We are behind on scheduling but should be scheduled soon.  PFTs not performed.  We agreed on obtaining these. ? ?HPI at initial visit: ?Notes onset of upper belly pain, low chest pain several months ago.  Seen by GI.  Increasing GERD medications without improvement.  CT abdomen pelvis ordered which was most notable for esophagitis.  Tried sulcal fate without improvement but did increase his sugars.  Unfortunate, developed epiglottitis and upper airway infection 12/2020.  Got dose dexamethasone.  Improved swelling.  Antibiotics prescribed also help.  Since then he describes a chest pressure discomfort.  Points, substernal.  Worse when he lies supine.  No exertional component.  No dyspnea.  Little bit worse when sitting up compared to standing up.  No other alleviating or exacerbating factors, positional changes, time of day, seasonal or environmental factors he can  apply to make things better or worse.  He continues on high-dose Symbicort.  This does not help at all.  Seems to make things worse.  Albuterol provides some mild relief over the course of a couple of hours. ? ?Review chest x-ray 02/11/2021 that on my review interpretation reveals clear lungs bilaterally.  CTA PE protocol from same date reviewed which shows clear lungs, no abnormality on my review and interpretation. ? ?PMH: Hypertension, asthma, diabetes, hyperlipidemia, headaches, sleep apnea ?Surgical history: Neck surgery 2007  ?family history: Allergies, CAD in first relatives ?Social history: Former smoker, quit 2014, 1 pack year history, lives in [place] ? ? ?Questionaires / Pulmonary Flowsheets:  ? ?ACT:  ?   ? View : No data to display.  ?  ?  ?  ? ? ?MMRC: ?   ? View : No data to display.  ?  ?  ?  ? ? ?Epworth:  ? ?  11/03/2015  ?  3:00 PM  ?Results of the Epworth flowsheet  ?Sitting and reading 2  ?Watching TV 2  ?Sitting, inactive in a public place (e.g. a theatre or a meeting) 2  ?As a passenger in a car for an hour without a break 2  ?Lying down to rest in the afternoon when circumstances permit 3  ?Sitting and talking to someone 1  ?Sitting quietly after a lunch without alcohol 2  ?In a car, while stopped for a few minutes in traffic 2  ?Total score 16  ? ? ?  Tests:  ? ?FENO:  ?No results found for: NITRICOXIDE ? ?PFT: ?   ? View : No data to display.  ?  ?  ?  ? ? ?WALK:  ?   ? View : No data to display.  ?  ?  ?  ? ? ?Imaging: ?Personally reviewed and as per EMR discussion this note ?NM Gastric Emptying ? ?Result Date: 06/07/2021 ?CLINICAL DATA:  Gastroparesis suspected. EXAM: NUCLEAR MEDICINE GASTRIC EMPTYING SCAN TECHNIQUE: After oral ingestion of radiolabeled meal, sequential abdominal images were obtained for 4 hours. Percentage of activity emptying the stomach was calculated at 1 hour, 2 hour, 3 hour, and 4 hours. RADIOPHARMACEUTICALS:  2.1 mCi Tc-49msulfur colloid in standardized meal  COMPARISON:  CT abdomen and pelvis December 27, 2020 FINDINGS: Expected location of the stomach in the left upper quadrant. Ingested meal empties the stomach gradually over the course of the study. 55% emptied at 1 hr ( normal >= 10%) 71% emptied at 2 hr ( normal >= 40%) 87% emptied at 3 hr ( normal >= 70%) 97% emptied at 4 hr ( normal >= 90%) IMPRESSION: Normal  gastric emptying study. Electronically Signed   By: JDahlia BailiffM.D.   On: 06/07/2021 18:59   ? ?Lab Results: ?Personally reviewed, no anemia, no significant elevation in eosinophils ?CBC ?   ?Component Value Date/Time  ? WBC 6.8 02/11/2021 1010  ? RBC 5.12 02/11/2021 1010  ? HGB 14.7 02/11/2021 1010  ? HCT 46.0 02/11/2021 1010  ? PLT 186 02/11/2021 1010  ? MCV 89.8 02/11/2021 1010  ? MCH 28.7 02/11/2021 1010  ? MCHC 32.0 02/11/2021 1010  ? RDW 13.9 02/11/2021 1010  ? LYMPHSABS 1.4 02/01/2021 1500  ? MONOABS 0.5 02/01/2021 1500  ? EOSABS 0.0 02/01/2021 1500  ? BASOSABS 0.0 02/01/2021 1500  ? ? ?BMET ?   ?Component Value Date/Time  ? NA 144 03/10/2021 0910  ? K 4.5 03/10/2021 0910  ? CL 104 03/10/2021 0910  ? CO2 26 03/10/2021 0910  ? GLUCOSE 204 (H) 03/10/2021 0910  ? GLUCOSE 109 (H) 02/11/2021 1010  ? BUN 15 03/10/2021 0910  ? CREATININE 0.83 03/10/2021 0910  ? CALCIUM 9.7 03/10/2021 0910  ? GFRNONAA >60 02/11/2021 1010  ? GFRAA >60 03/29/2018 1706  ? ? ?BNP ?No results found for: BNP ? ?ProBNP ?No results found for: PROBNP ? ?Specialty Problems   ? ?  ? Pulmonary Problems  ? Reactive airways dysfunction syndrome (HCC)  ? Allergic rhinitis  ? Asthma in adult  ? Acute bacterial bronchitis  ? Asymmetric tonsils  ?  Last Assessment & Plan:  ?Formatting of this note might be different from the original. ?Concern over tonsil asymmetry. ?Noted on recent imaging of the head.  Denies any symptoms.  Smoked in the distant past. ?EXAM shows relatively symmetric appearing tonsils.  Both are soft on palpation.  No adenopathy in the neck. ?PLAN: Reassured all looks  okay.  No further treatment or evaluation necessary. ? ?  ?  ? Laryngitis  ? Nasal septal deviation  ? Sleep apnea  ? Dyspnea on exertion  ? ? ?No Known Allergies ? ?Immunization History  ?Administered Date(s) Administered  ? DTP 11/12/1980, 01/18/1981  ? Hepatitis B 10/12/1995, 11/15/1995, 05/02/1996  ? Influenza,inj,Quad PF,6+ Mos 11/16/2020  ? MMR 12/03/1981, 11/15/1995  ? PFIZER(Purple Top)SARS-COV-2 Vaccination 04/26/2019, 05/18/2019, 01/01/2020  ? Pneumococcal Polysaccharide-23 04/05/2020  ? Tdap 08/13/2012  ? ? ?Past Medical History:  ?Diagnosis Date  ? Allergy   ?  Allergy, unspecified not elsewhere classified   ? rx w/ OTC antihistamines PRN  ? Anxiety   ? Asthma   ? Atypical chest pain   ? recent neg eval w/ normal cxr/ekg  ? Depression   ? Diverticulosis   ? DM (diabetes mellitus) ([place])   ? Dyspepsia   ? on protonix 78m/d  ? Esophagitis   ? GERD (gastroesophageal reflux disease)   ? on protonix 460md  ? HTN (hypertension)   ? Hyperlipidemia   ? on diet alone  ? IBS (irritable bowel syndrome)   ? Lumbar back pain   ? s/p lumbar laminectomy 2007 by DrCabbell  ? Migraines   ? Overweight(278.02)   ? weight Jan10=246#...Marland Kitchene was 225# in 9/07...diet and exercise was discussed  ? Sleep apnea   ? not wearing c-pap currently  ? ? ?Tobacco History: ?Social History  ? ?Tobacco Use  ?Smoking Status Former  ? Packs/day: 0.10  ? Years: 8.00  ? Pack years: 0.80  ? Types: Cigarettes  ? Quit date: 03/03/2011  ? Years since quitting: 10.2  ?Smokeless Tobacco Never  ? ?Counseling given: Not Answered ? ? ?Continue to not smoke ? ?Outpatient Encounter Medications as of 06/08/2021  ?Medication Sig  ? albuterol (VENTOLIN HFA) 108 (90 Base) MCG/ACT inhaler INHALE 2 PUFFS INTO THE LUNGS EVERY 6 HOURS AS NEEDED FOR WHEEZING OR SHORTNESS OF BREATH (Patient taking differently: Inhale 2 puffs into the lungs every 6 (six) hours as needed for wheezing or shortness of breath.)  ? atorvastatin (LIPITOR) 10 MG tablet TAKE 1 TABLET(10 MG) BY  MOUTH DAILY (Patient taking differently: Take 10 mg by mouth daily.)  ? Baclofen 5 MG TABS Take 1 tablet by mouth in the morning and at bedtime.  ? calcium carbonate (OS-CAL) 1250 (500 Ca) MG chewable tablet C

## 2021-06-15 ENCOUNTER — Ambulatory Visit (INDEPENDENT_AMBULATORY_CARE_PROVIDER_SITE_OTHER): Payer: BC Managed Care – PPO | Admitting: Medical

## 2021-06-15 VITALS — BP 125/78 | HR 90 | Temp 98.7°F | Resp 18 | Ht 70.0 in | Wt 202.4 lb

## 2021-06-15 DIAGNOSIS — K219 Gastro-esophageal reflux disease without esophagitis: Secondary | ICD-10-CM

## 2021-06-15 DIAGNOSIS — W57XXXA Bitten or stung by nonvenomous insect and other nonvenomous arthropods, initial encounter: Secondary | ICD-10-CM | POA: Diagnosis not present

## 2021-06-15 DIAGNOSIS — E119 Type 2 diabetes mellitus without complications: Secondary | ICD-10-CM | POA: Diagnosis not present

## 2021-06-15 DIAGNOSIS — S70361A Insect bite (nonvenomous), right thigh, initial encounter: Secondary | ICD-10-CM

## 2021-06-15 MED ORDER — DOXYCYCLINE HYCLATE 100 MG PO TABS: 100.0000 mg | ORAL_TABLET | Freq: Two times a day (BID) | ORAL | 0 refills | Status: DC

## 2021-06-15 NOTE — Patient Instructions (Addendum)
Tick bite to right leg. Can see mark where tick was atttached. How long tick was attached and when attached unclear. Tick antibody testing before 2 weeks can be innacurate/result in false negative. After discussion today decided against testing and went ahead and rx'd doxycycline antibiotic. Advised on how to avoid gi side effects of antbiotic. ? ?For gerd  improved control continue omeprazole and famotadine. ? ?For diabetes check cmp and a1c. Continue metofmrin and farxiga. Will follow labs and let you know if treatment changes needed. ? ?Follow up 3 months or sooner if needed. ?

## 2021-06-15 NOTE — Progress Notes (Signed)
? ?Subjective:  ? ? Patient ID: Anthony AnonSteven L Russell, male    DOB: 07/14/1980, 41 y.o.   MRN: [id number] ? ?HPI ? ?Pt states he recently saw little black spot on rt thigh. He tried to pick it off but could not remove. He noticed was small tick. He does not know how long it had been on rt thigh. No fever, no chills, no sweats, no bodyaches or rash. ? ?Pt states no outdoor activity. Only outdoor actiivity walking from apartment to car. ? ? ?Pt gives update that gerd symptoms are improved after eating healthy and purposeful weight loss of 20 lbs. Pt is on protonix 40 mg and famotadine 20 mg at night. ? ?Diabetes- on metformin 1000 mg twice daily and farxiga 5 mg daily. 11-16-20 a1c 7.5. ? ? ?Review of Systems  ?Constitutional:  Negative for chills, fatigue and fever.  ?Respiratory:  Negative for cough, chest tightness, shortness of breath and wheezing.   ?Cardiovascular:  Negative for chest pain and palpitations.  ?Gastrointestinal:  Negative for abdominal pain.  ?Musculoskeletal:  Negative for arthralgias, back pain and neck pain.  ?Skin:  Negative for rash.  ?Neurological:  Negative for dizziness, light-headedness and headaches.  ?Hematological:  Negative for adenopathy. Does not bruise/bleed easily.  ?Psychiatric/Behavioral:  Negative for behavioral problems and confusion.   ? ? ?Past Medical History:  ?Diagnosis Date  ? Allergy   ? Allergy, unspecified not elsewhere classified   ? rx w/ OTC antihistamines PRN  ? Anxiety   ? Asthma   ? Atypical chest pain   ? recent neg eval w/ normal cxr/ekg  ? Depression   ? Diverticulosis   ? DM (diabetes mellitus) (HCC)   ? Dyspepsia   ? on protonix 40mg /d  ? Esophagitis   ? GERD (gastroesophageal reflux disease)   ? on protonix 40mg /d  ? HTN (hypertension)   ? Hyperlipidemia   ? on diet alone  ? IBS (irritable bowel syndrome)   ? Lumbar back pain   ? s/p lumbar laminectomy 2007 by DrCabbell  ? Migraines   ? Overweight(278.02)   ? weight Jan10=246#.[name]..he was 225# in 9/07...diet and  exercise was discussed  ? Sleep apnea   ? not wearing c-pap currently  ? ?  ?Social History  ? ?Socioeconomic History  ? Marital status: Single  ?  Spouse name: Not on file  ? Number of children: 0  ? Years of education: Not on file  ? Highest education level: Not on file  ?Occupational History  ? Occupation: Systems analystoftware Developer  ?Tobacco Use  ? Smoking status: Former  ?  Packs/day: 0.10  ?  Years: 8.00  ?  Pack years: 0.80  ?  Types: Cigarettes  ?  Quit date: 03/03/2011  ?  Years since quitting: 10.2  ? Smokeless tobacco: Never  ?Vaping Use  ? Vaping Use: Never used  ?Substance and Sexual Activity  ? Alcohol use: Yes  ?  Comment: once or twice a month  ? Drug use: No  ? Sexual activity: Not on file  ?Other Topics Concern  ? Not on file  ?Social History Narrative  ? 3 cups caffeine a day  ? ?Social Determinants of Health  ? ?Financial Resource Strain: Not on file  ?Food Insecurity: Not on file  ?Transportation Needs: Not on file  ?Physical Activity: Not on file  ?Stress: Not on file  ?Social Connections: Not on file  ?Intimate Partner Violence: Not on file  ? ? ?Past Surgical History:  ?Procedure  Laterality Date  ? DENTAL SURGERY    ? LUMBAR LAMINECTOMY  01/30/2005  ? DrCabbell  ? TEAR DUCT PROBING    ? ? ?Family History  ?Problem Relation Age of Onset  ? Allergies Mother   ? Irritable bowel syndrome Mother   ? Diabetes Mother   ? Allergies Father   ? Diabetes Father   ? Hyperlipidemia Father   ? Irritable bowel syndrome Brother   ? Heart disease Brother   ? Allergies Brother   ? Breast cancer Maternal Aunt   ? Colon cancer Neg Hx   ? Esophageal cancer Neg Hx   ? Rectal cancer Neg Hx   ? Stomach cancer Neg Hx   ? ? ?No Known Allergies ? ?Current Outpatient Medications on File Prior to Visit  ?Medication Sig Dispense Refill  ? albuterol (VENTOLIN HFA) 108 (90 Base) MCG/ACT inhaler INHALE 2 PUFFS INTO THE LUNGS EVERY 6 HOURS AS NEEDED FOR WHEEZING OR SHORTNESS OF BREATH (Patient taking differently: Inhale 2 puffs into  the lungs every 6 (six) hours as needed for wheezing or shortness of breath.) 20.1 g 0  ? atorvastatin (LIPITOR) 10 MG tablet TAKE 1 TABLET(10 MG) BY MOUTH DAILY (Patient taking differently: Take 10 mg by mouth daily.) 90 tablet 3  ? Baclofen 5 MG TABS Take 1 tablet by mouth in the morning and at bedtime. 60 tablet 1  ? calcium carbonate (OS-CAL) 1250 (500 Ca) MG chewable tablet Chew 2 tablets by mouth daily.    ? dicyclomine (BENTYL) 10 MG capsule Take 1 capsule (10 mg total) by mouth 3 (three) times daily as needed for spasms (abdominal pain). 30 capsule 0  ? famotidine (PEPCID) 20 MG tablet Take 1 tablet (20 mg total) by mouth at bedtime. 30 tablet 3  ? FARXIGA 5 MG TABS tablet TAKE 1 TABLET(5 MG) BY MOUTH DAILY 30 tablet 3  ? fluticasone-salmeterol (ADVAIR HFA) 230-21 MCG/ACT inhaler Inhale 2 puffs into the lungs 2 (two) times daily. 1 each 3  ? Fluticasone-Umeclidin-Vilant (TRELEGY ELLIPTA) 200-62.5-25 MCG/ACT AEPB Inhale 1 puff into the lungs daily. 28 each 0  ? glycopyrrolate (ROBINUL) 2 MG tablet TAKE 1 TABLET BY MOUTH TWICE DAILY AS NEEDED FOR ABDOMINAL DISCOMFORT (Patient taking differently: Take 1 mg by mouth 2 (two) times daily as needed (abdominal discomfort).) 60 tablet 2  ? L-Arginine 500 MG CAPS Take 2 capsules by mouth 2 (two) times daily.    ? levocetirizine (XYZAL) 5 MG tablet Take 1 tablet (5 mg total) by mouth every evening. 30 tablet 3  ? metFORMIN (GLUCOPHAGE) 1000 MG tablet TAKE 1 TABLET(1000 MG) BY MOUTH TWICE DAILY WITH A MEAL (Patient taking differently: Take 1,000 mg by mouth 2 (two) times daily with a meal. TAKE 1 TABLET(1000 MG) BY MOUTH TWICE DAILY WITH A MEAL) 180 tablet 1  ? Multiple Vitamin (MULTIVITAMIN ADULT PO) Take 2 tablets by mouth daily.    ? Nutritional Supplements (PYCNOGENOL) 300-30 MG CAPS Take 1 capsule by mouth 3 (three) times daily.    ? pantoprazole (PROTONIX) 40 MG tablet Take 1 tablet (40 mg total) by mouth 2 (two) times daily before a meal. 60 tablet 6  ?  propranolol (INDERAL) 20 MG tablet Take 1 tablet (20 mg total) by mouth 2 (two) times daily. 60 tablet 3  ? ?No current facility-administered medications on file prior to visit.  ? ? ?BP 125/78   Pulse 90   Temp 98.7 ?F (37.1 ?C)   Resp 18   Ht 5'  10" (1.778 m)   Wt 202 lb 6.4 oz (91.8 kg)   SpO2 98%   BMI 29.04 kg/m?  ?  ?   ?Objective:  ? Physical Exam ? ?General ?Mental Status- Alert. General Appearance- Not in acute distress.  ? ?Skin ?General: Color- Normal Color. Moisture- Normal Moisture. ? ?Neck ?Carotid Arteries- Normal color. Moisture- Normal Moisture. No carotid bruits. No JVD. ? ?Chest and Lung Exam ?Auscultation: ?Breath Sounds:-Normal. ? ?Cardiovascular ?Auscultation:Rythm- Regular. ?Murmurs & Other Heart Sounds:Auscultation of the heart reveals- No Murmurs. ? ?Abdomen ?Inspection:-Inspeection Normal. ?Palpation/Percussion:Note:No mass. Palpation and Percussion of the abdomen reveal- Non Tender, Non Distended + BS, no rebound or guarding. ? ? ?Neurologic ?Cranial Nerve exam:- CN III-XII intact(No nystagmus), symmetric smile. ?Strength:- 5/5 equal and symmetric strength both upper and lower extremities.  ? ? ?   ?Assessment & Plan:  ? ?Patient Instructions  ?Tick bite to right leg. Can see mark where tick was atttached. How long tick was attached and when attached unclear. Tick antibody testing before 2 weeks can be innacurate/result in false negative. After discussion today decided against testing and went ahead and rx'd doxycycline antibiotic. Advised on how to avoid gi side effects of antbiotic. ? ?For gerd  improved control continue omeprazole and famotadine. ? ?For diabetes check cmp and a1c. Continue metofmrin and farxiga. Will follow labs and let you know if treatment changes needed. ? ?Follow up 3 months or sooner if needed.  ? ?Esperanza Richters, PA-C  ?

## 2021-06-16 LAB — COMPREHENSIVE METABOLIC PANEL
ALT: 22 U/L (ref 0–53)
AST: 16 U/L (ref 0–37)
Albumin: 4.8 g/dL (ref 3.5–5.2)
Alkaline Phosphatase: 65 U/L (ref 39–117)
BUN: 9 mg/dL (ref 6–23)
CO2: 31 mEq/L (ref 19–32)
Calcium: 9.7 mg/dL (ref 8.4–10.5)
Chloride: 104 mEq/L (ref 96–112)
Creatinine, Ser: 0.9 mg/dL (ref 0.40–1.50)
GFR: 106.63 mL/min (ref >60.00)
Glucose, Bld: 98 mg/dL (ref 70–99)
Potassium: 4 mEq/L (ref 3.5–5.1)
Sodium: 143 mEq/L (ref 135–145)
Total Bilirubin: 0.5 mg/dL (ref 0.2–1.2)
Total Protein: 7.2 g/dL (ref 6.0–8.3)

## 2021-06-16 LAB — HEMOGLOBIN A1C: Hgb A1c MFr Bld: 5.7 % (ref 4.6–6.5)

## 2021-06-30 ENCOUNTER — Ambulatory Visit: Payer: BC Managed Care – PPO

## 2021-06-30 ENCOUNTER — Telehealth: Payer: Self-pay | Admitting: Pulmonary Disease

## 2021-06-30 DIAGNOSIS — G4733 Obstructive sleep apnea (adult) (pediatric): Secondary | ICD-10-CM

## 2021-06-30 NOTE — Telephone Encounter (Signed)
Called patient and informed him that the PFT and not released to Mychart because [facility] has not looked at them yet. I told him they will post when he looks at them. Nothing further needed

## 2021-07-05 ENCOUNTER — Ambulatory Visit: Payer: BC Managed Care – PPO

## 2021-07-05 DIAGNOSIS — R0683 Snoring: Secondary | ICD-10-CM | POA: Diagnosis not present

## 2021-07-08 DIAGNOSIS — R0683 Snoring: Secondary | ICD-10-CM | POA: Diagnosis not present

## 2021-07-26 ENCOUNTER — Encounter: Payer: Self-pay | Admitting: Medical

## 2021-07-27 MED ORDER — FREESTYLE LIBRE 3 SENSOR MISC: 1 refills | Status: DC

## 2021-08-03 ENCOUNTER — Ambulatory Visit (INDEPENDENT_AMBULATORY_CARE_PROVIDER_SITE_OTHER): Payer: BC Managed Care – PPO | Admitting: Internal Medicine

## 2021-08-03 ENCOUNTER — Encounter: Payer: Self-pay | Admitting: Internal Medicine

## 2021-08-03 VITALS — BP 108/66 | HR 92 | Ht 68.0 in | Wt 206.5 lb

## 2021-08-03 DIAGNOSIS — K219 Gastro-esophageal reflux disease without esophagitis: Secondary | ICD-10-CM

## 2021-08-03 MED ORDER — BACLOFEN 10 MG PO TABS: 10.0000 mg | ORAL_TABLET | Freq: Two times a day (BID) | ORAL | 3 refills | Status: DC

## 2021-08-03 MED ORDER — BACLOFEN 10 MG PO TABS: 10.0000 mg | ORAL_TABLET | Freq: Three times a day (TID) | ORAL | 3 refills | Status: DC

## 2021-08-03 NOTE — Patient Instructions (Addendum)
If you are age 41 or older, your body mass index should be between 23-30. Your Body mass index is 31.4 kg/m. If this is out of the aforementioned range listed, please consider follow up with your Primary Care Provider.  If you are age 67 or younger, your body mass index should be between 19-25. Your Body mass index is 31.4 kg/m. If this is out of the aformentioned range listed, please consider follow up with your Primary Care Provider.   We have sent the following medications to your pharmacy for you to pick up at your convenience: Baclofen 10 mg  twice daily.  The [facility] providers would like to encourage you to use [facility] to communicate with providers for non-urgent requests or questions.  Due to long hold times on the telephone, sending your provider a message by [facility] may be a faster and more efficient way to get a response.  Please allow 48 business hours for a response.  Please remember that this is for non-urgent requests.   Thank you for entrusting me with your care and for choosing [facility], Dr. [name]

## 2021-08-03 NOTE — Progress Notes (Signed)
08/03/2021 Anthony Russell [id number] 1980/02/26  History of Present Illness: Anthony Russell is a 41 year old male with a past medical history of anxiety, depression, hypertension, hyperlipidemia, diabetes mellitus type 2, sleep apnea, asthma, atypical chest pain presents for follow up with GERD   Interval History: The baclofen seems to be helping with his GERD. He is still having some breakthrough acid reflux symptoms, usually about once per day on average. The symptoms are worst just after eating and walking for a long period of time. Denies dysphagia. He has been sleeping at an incline but he is still having acid reflux. He has lost a lot of weight deliberately. He has adjusted his diet to try to be more reflux friendly. Drinks one cup of coffee daily and drinks alcohol sparingly. Patient was able to complete his Nystatin swish and swallow for oral candidiasis with improvement in his symptoms.  Wt Readings from Last 3 Encounters:  08/03/21 206 lb 8 oz (93.7 kg)  06/15/21 202 lb 6.4 oz (91.8 kg)  06/08/21 202 lb 3.2 oz (91.7 kg)       Latest Ref Rng & Units 02/11/2021   10:10 AM 02/01/2021    3:00 PM 01/18/2021   11:02 AM  CBC  WBC 4.0 - 10.5 K/uL 6.8  12.4  12.4   Hemoglobin 13.0 - 17.0 g/dL 17.4  94.4  96.7   Hematocrit 39.0 - 52.0 % 46.0  44.7  45.8   Platelets 150 - 400 K/uL 186  195  182        Latest Ref Rng & Units 06/15/2021    3:32 PM 03/10/2021    9:10 AM 02/11/2021   10:10 AM  CMP  Glucose 70 - 99 mg/dL 98  591  638   BUN 6 - 23 mg/dL 9  15  16    Creatinine 0.40 - 1.50 mg/dL  4.66  5.99   Sodium 135 - 145 mEq/L 143  144  143   Potassium 3.5 - 5.1 mEq/L 4.0  4.5  3.9   Chloride 96 - 112 mEq/L 104  104  103   CO2 19 - 32 mEq/L 31  26  31    Calcium 8.4 - 10.5 mg/dL 9.7  9.7  9.9   Total Protein 6.0 - 8.3 g/dL 7.2     Total Bilirubin 0.2 - 1.2 mg/dL 0.5     Alkaline Phos 39 - 117 U/L 65     AST 0 - 37 U/L 16     ALT 0 - 53 U/L 22       Gastric emptying study  06/07/21: IMPRESSION: Normal  gastric emptying study.  EGD 03/25/2021: Normal esophagus. Biopsied. - Gastritis. Biopsied. - A small amount of food (residue) in the stomach. - Retained food in the duodenum. - Biopsies were taken with a cold forceps for histology in the duodenal bulb and in the second portion of the duodenum. 1. Surgical [P], duodenal BENIGN DUODENAL MUCOSA WITH NO DIAGNOSTIC ABNORMALITY 2. Surgical [P], gastric REACTIVE GASTROPATHY AND MINIMAL CHRONIC GASTRITIS NEGATIVE FOR H. PYLORI, INTESTINAL METAPLASIA, DYSPLASIA AND CARCINOMA 3. Surgical [P], esophagus REACTIVE SQUAMOUS MUCOSA WITH CHANGES COMPATIBLE WITH REFLUX ESOPHAGITIS (NO EOSINOPHILS IDENTIFIED  Current Outpatient Medications on File Prior to Visit  Medication Sig Dispense Refill   albuterol (VENTOLIN HFA) 108 (90 Base) MCG/ACT inhaler INHALE 2 PUFFS INTO THE LUNGS EVERY 6 HOURS AS NEEDED FOR WHEEZING OR SHORTNESS OF BREATH (Patient taking differently: Inhale 2 puffs into the lungs  every 6 (six) hours as needed for wheezing or shortness of breath.) 20.1 g 0   atorvastatin (LIPITOR) 10 MG tablet TAKE 1 TABLET(10 MG) BY MOUTH DAILY (Patient taking differently: Take 10 mg by mouth daily.) 90 tablet 3   Baclofen 5 MG TABS Take 1 tablet by mouth in the morning and at bedtime. 60 tablet 1   calcium carbonate (OS-CAL) 1250 (500 Ca) MG chewable tablet Chew 2 tablets by mouth daily.     Continuous Blood Gluc Sensor (FREESTYLE LIBRE 3 SENSOR) MISC Place 1 sensor on the skin every 14 days. Use to check glucose continuously 2 each 1   dicyclomine (BENTYL) 10 MG capsule Take 1 capsule (10 mg total) by mouth 3 (three) times daily as needed for spasms (abdominal pain). 30 capsule 0   famotidine (PEPCID) 20 MG tablet Take 1 tablet (20 mg total) by mouth at bedtime. 30 tablet 3   FARXIGA 5 MG TABS tablet TAKE 1 TABLET(5 MG) BY MOUTH DAILY 30 tablet 3   fluticasone-salmeterol (ADVAIR HFA) 230-21 MCG/ACT inhaler Inhale 2 puffs into  the lungs 2 (two) times daily. 1 each 3   Fluticasone-Umeclidin-Vilant (TRELEGY ELLIPTA) 200-62.5-25 MCG/ACT AEPB Inhale 1 puff into the lungs daily. 28 each 0   glycopyrrolate (ROBINUL) 2 MG tablet TAKE 1 TABLET BY MOUTH TWICE DAILY AS NEEDED FOR ABDOMINAL DISCOMFORT (Patient taking differently: Take 1 mg by mouth 2 (two) times daily as needed (abdominal discomfort).) 60 tablet 2   L-Arginine 500 MG CAPS Take 2 capsules by mouth 2 (two) times daily.     levocetirizine (XYZAL) 5 MG tablet Take 1 tablet (5 mg total) by mouth every evening. 30 tablet 3   metFORMIN (GLUCOPHAGE) 1000 MG tablet TAKE 1 TABLET(1000 MG) BY MOUTH TWICE DAILY WITH A MEAL (Patient taking differently: Take 1,000 mg by mouth 2 (two) times daily with a meal. TAKE 1 TABLET(1000 MG) BY MOUTH TWICE DAILY WITH A MEAL) 180 tablet 1   Multiple Vitamin (MULTIVITAMIN ADULT PO) Take 2 tablets by mouth daily.     Nutritional Supplements (PYCNOGENOL) 300-30 MG CAPS Take 1 capsule by mouth 3 (three) times daily.     pantoprazole (PROTONIX) 40 MG tablet Take 1 tablet (40 mg total) by mouth 2 (two) times daily before a meal. 60 tablet 6   propranolol (INDERAL) 20 MG tablet Take 1 tablet (20 mg total) by mouth 2 (two) times daily. 60 tablet 3   No current facility-administered medications on file prior to visit.   Physical Exam: BP 108/66 (BP Location: Left Arm, Patient Position: Sitting, Cuff Size: Normal)   Pulse 92   Ht 5\' 8"  (1.727 m) Comment: height measured without shoes  Wt 206 lb 8 oz (93.7 kg)   BMI 31.40 kg/m   Wt Readings from Last 3 Encounters:  08/03/21 206 lb 8 oz (93.7 kg)  06/15/21 202 lb 6.4 oz (91.8 kg)  06/08/21 202 lb 3.2 oz (91.7 kg)    General: 41 year old male in no acute distress. Head: Normocephalic and atraumatic. Eyes: No scleral icterus. Conjunctiva pink . Ears: Normal auditory acuity. Mouth: Normal oral mucosa Lungs: Clear throughout to auscultation. Heart: Regular rate and rhythm, no  murmur. Abdomen: Soft, nondistended.   Musculoskeletal: Symmetrical with no gross deformities. Extremities: No edema. Neurological: Alert oriented x 4. No focal deficits.  Psychological: Alert and cooperative. Normal mood and affect  Assessment and Recommendations: Epigastric abdominal pain GERD Oral candidiasis Patient has had some improvement of her GERD but has still has some  breakthrough symptoms. Baclofen does seem to be helping so will increase his baclofen dosing. His GES was normal. - Continue Protonix BID and Pepcid BID PRN - Increase baclofen 5 mg BID to 10 mg BID - RTC in 2 months

## 2021-08-08 ENCOUNTER — Encounter: Payer: Self-pay | Admitting: Medical

## 2021-08-10 ENCOUNTER — Ambulatory Visit (INDEPENDENT_AMBULATORY_CARE_PROVIDER_SITE_OTHER): Payer: BC Managed Care – PPO | Admitting: Medical

## 2021-08-10 VITALS — BP 109/69 | HR 102 | Resp 18 | Ht 68.0 in | Wt 209.8 lb

## 2021-08-10 DIAGNOSIS — K219 Gastro-esophageal reflux disease without esophagitis: Secondary | ICD-10-CM

## 2021-08-10 DIAGNOSIS — E1169 Type 2 diabetes mellitus with other specified complication: Secondary | ICD-10-CM | POA: Diagnosis not present

## 2021-08-10 DIAGNOSIS — E119 Type 2 diabetes mellitus without complications: Secondary | ICD-10-CM

## 2021-08-10 DIAGNOSIS — E785 Hyperlipidemia, unspecified: Secondary | ICD-10-CM

## 2021-08-10 NOTE — Patient Instructions (Addendum)
Diabetes with good a1c in may. Some low sugar events early in morning caught by CGM. With a1c of 5.7 in May can stop farxiga and use just meformin presently. Use metformin 1000 mg in am and 500 mg at night. Get a1 with cmp end of august.  For high cholesterol atorvastatin and get lipid pane end of august as well. Do labs fasting.  For gerd continue to follow up with GI MD.  Follow up mid to ate October but sooner if needed. Ask you send me my chart update  regarding sugar levels in morning.

## 2021-08-10 NOTE — Progress Notes (Signed)
Subjective:    Patient ID: Anthony Russell, male    DOB: February 11, 1980, 41 y.o.   MRN: [id number]  HPI Pt is diabetic. He just ate one hour ago his sugar right now is 156. He ate half meat ball sub.  Pt last  a1c was 5.7 in Jun 15, 2021.  Pt did have one low sugar level 65 at 4:30 am and another time at 5:30. Pt sugar level is 115 on average. He got 65 other time at night. One sugar level was 54 at 4:05.  Pt is currently taking metformin 1000 mg before breakfast. He will take farxiga if sugar level gets to 170.    Genella Rife- being seen by gi md. He is on pepcid and gi recently baclofen.    Review of Systems  Constitutional:  Negative for chills, fatigue and fever.  Respiratory:  Negative for cough, chest tightness, shortness of breath and wheezing.   Cardiovascular:  Negative for chest pain and palpitations.  Gastrointestinal:  Negative for abdominal pain and constipation.  Genitourinary:  Negative for dysuria, flank pain and frequency.  Musculoskeletal:  Negative for back pain, joint swelling and myalgias.  Skin:  Negative for rash.  Neurological:  Negative for dizziness, light-headedness and headaches.  Hematological:  Negative for adenopathy. Does not bruise/bleed easily.  Psychiatric/Behavioral:  Negative for behavioral problems.     Past Medical History:  Diagnosis Date   Allergy    Allergy, unspecified not elsewhere classified    rx w/ OTC antihistamines PRN   Anxiety    Asthma    Atypical chest pain    recent neg eval w/ normal cxr/ekg   Depression    Diverticulosis    DM (diabetes mellitus) (HCC)    Dyspepsia    on protonix 40mg /d   Esophagitis    GERD (gastroesophageal reflux disease)    on protonix 40mg /d   HTN (hypertension)    Hyperlipidemia    on diet alone   IBS (irritable bowel syndrome)    Lumbar back pain    s/p lumbar laminectomy 2007 by DrCabbell   Migraines    Overweight(278.02)    weight Jan10=246#. .he was 225# in 9/07...diet and exercise was  discussed   Sleep apnea    not wearing c-pap currently     Social History   Socioeconomic History   Marital status: Single    Spouse name: Not on file   Number of children: 0   Years of education: Not on file   Highest education level: Not on file  Occupational History   Occupation: [name]  Tobacco Use   Smoking status: Former    Packs/day: 0.10    Years: 8.00    Total pack years: 0.80    Types: Cigarettes    Quit date: 03/03/2011    Years since quitting: 10.4   Smokeless tobacco: Never  Vaping Use   Vaping Use: Never used  Substance and Sexual Activity   Alcohol use: Yes    Comment: once or twice a month   Drug use: No   Sexual activity: Not on file  Other Topics Concern   Not on file  Social History Narrative   3 cups caffeine a day   Social Determinants of Health   Financial Resource Strain: Not on file  Food Insecurity: Not on file  Transportation Needs: Not on file  Physical Activity: Not on file  Stress: Not on file  Social Connections: Not on file  Intimate Partner Violence: Not on file  Past Surgical History:  Procedure Laterality Date   DENTAL SURGERY     LUMBAR LAMINECTOMY  01/30/2005   DrCabbell   TEAR DUCT PROBING      Family History  Problem Relation Age of Onset   Allergies Mother    Irritable bowel syndrome Mother    Diabetes Mother    Allergies Father    Diabetes Father    Hyperlipidemia Father    Irritable bowel syndrome Brother    Heart disease Brother    Allergies Brother    Breast cancer Maternal Aunt    Colon cancer Neg Hx    Esophageal cancer Neg Hx    Rectal cancer Neg Hx    Stomach cancer Neg Hx     No Known Allergies  Current Outpatient Medications on File Prior to Visit  Medication Sig Dispense Refill   albuterol (VENTOLIN HFA) 108 (90 Base) MCG/ACT inhaler INHALE 2 PUFFS INTO THE LUNGS EVERY 6 HOURS AS NEEDED FOR WHEEZING OR SHORTNESS OF BREATH (Patient taking differently: Inhale 2 puffs into the lungs  every 6 (six) hours as needed for wheezing or shortness of breath.) 20.1 g 0   atorvastatin (LIPITOR) 10 MG tablet TAKE 1 TABLET(10 MG) BY MOUTH DAILY (Patient taking differently: Take 10 mg by mouth daily.) 90 tablet 3   baclofen (LIORESAL) 10 MG tablet Take 1 tablet (10 mg total) by mouth 2 (two) times daily. 30 each 3   calcium carbonate (OS-CAL) 1250 (500 Ca) MG chewable tablet Chew 2 tablets by mouth daily.     Continuous Blood Gluc Sensor (FREESTYLE LIBRE 3 SENSOR) MISC Place 1 sensor on the skin every 14 days. Use to check glucose continuously 2 each 1   dicyclomine (BENTYL) 10 MG capsule Take 1 capsule (10 mg total) by mouth 3 (three) times daily as needed for spasms (abdominal pain). 30 capsule 0   famotidine (PEPCID) 20 MG tablet Take 1 tablet (20 mg total) by mouth at bedtime. 30 tablet 3   FARXIGA 5 MG TABS tablet TAKE 1 TABLET(5 MG) BY MOUTH DAILY 30 tablet 3   fluticasone-salmeterol (ADVAIR HFA) 230-21 MCG/ACT inhaler Inhale 2 puffs into the lungs 2 (two) times daily. 1 each 3   Fluticasone-Umeclidin-Vilant (TRELEGY ELLIPTA) 200-62.5-25 MCG/ACT AEPB Inhale 1 puff into the lungs daily. 28 each 0   glycopyrrolate (ROBINUL) 2 MG tablet TAKE 1 TABLET BY MOUTH TWICE DAILY AS NEEDED FOR ABDOMINAL DISCOMFORT (Patient taking differently: Take 1 mg by mouth 2 (two) times daily as needed (abdominal discomfort).) 60 tablet 2   L-Arginine 500 MG CAPS Take 2 capsules by mouth 2 (two) times daily.     levocetirizine (XYZAL) 5 MG tablet Take 1 tablet (5 mg total) by mouth every evening. 30 tablet 3   metFORMIN (GLUCOPHAGE) 1000 MG tablet TAKE 1 TABLET(1000 MG) BY MOUTH TWICE DAILY WITH A MEAL (Patient taking differently: Take 1,000 mg by mouth 2 (two) times daily with a meal. TAKE 1 TABLET(1000 MG) BY MOUTH TWICE DAILY WITH A MEAL) 180 tablet 1   Multiple Vitamin (MULTIVITAMIN ADULT PO) Take 2 tablets by mouth daily.     Nutritional Supplements (PYCNOGENOL) 300-30 MG CAPS Take 1 capsule by mouth 3  (three) times daily.     pantoprazole (PROTONIX) 40 MG tablet Take 1 tablet (40 mg total) by mouth 2 (two) times daily before a meal. 60 tablet 6   propranolol (INDERAL) 20 MG tablet Take 1 tablet (20 mg total) by mouth 2 (two) times daily. 60 tablet 3  No current facility-administered medications on file prior to visit.    BP 109/69   Pulse (!) 102   Resp 18   Ht 5\' 8"  (1.727 m)   Wt 209 lb 12.8 oz (95.2 kg)   SpO2 97%   BMI 31.90 kg/m        Objective:   Physical Exam  General Mental Status- Alert. General Appearance- Not in acute distress.   Skin General: Color- Normal Color. Moisture- Normal Moisture.  Neck Carotid Arteries- Normal color. Moisture- Normal Moisture. No carotid bruits. No JVD.  Chest and Lung Exam Auscultation: Breath Sounds:-Normal.  Cardiovascular Auscultation:Rythm- Regular. Murmurs & Other Heart Sounds:Auscultation of the heart reveals- No Murmurs.  Abdomen Inspection:-Inspeection Normal. Palpation/Percussion:Note:No mass. Palpation and Percussion of the abdomen reveal- Non Tender, Non Distended + BS, no rebound or guarding.   Neurologic Cranial Nerve exam:- CN III-XII intact(No nystagmus), symmetric smile. Strength:- 5/5 equal and symmetric strength both upper and lower extremities.       Assessment & Plan:   Patient Instructions  Diabetes with good a1c in may. Some low sugar events early in morning caught by CGM. With a1c of 5.7 in May can stop farxiga and use just meformin presently. Use metformin 1000 mg in am and 500 mg at night. Get a1 with cmp end of august.  For high cholesterol atorvastatin and get lipid pane end of august as well. Do labs fasting.  For gerd continue to follow up with GI MD.  Follow up mid to ate October but sooner if needed. Ask you send me my chart update  regarding sugar levels in morning.   November, PA-C

## 2021-08-11 ENCOUNTER — Encounter: Payer: Self-pay | Admitting: Cardiology

## 2021-08-11 ENCOUNTER — Ambulatory Visit (INDEPENDENT_AMBULATORY_CARE_PROVIDER_SITE_OTHER): Payer: BC Managed Care – PPO | Admitting: Cardiology

## 2021-08-11 VITALS — BP 110/64 | HR 80 | Ht 70.0 in | Wt 207.0 lb

## 2021-08-11 DIAGNOSIS — E782 Mixed hyperlipidemia: Secondary | ICD-10-CM | POA: Diagnosis not present

## 2021-08-11 DIAGNOSIS — E119 Type 2 diabetes mellitus without complications: Secondary | ICD-10-CM | POA: Diagnosis not present

## 2021-08-11 DIAGNOSIS — I10 Essential (primary) hypertension: Secondary | ICD-10-CM | POA: Diagnosis not present

## 2021-08-11 NOTE — Progress Notes (Signed)
Cardiology Office Note:    Date:  08/11/2021   ID:  Anthony Russell, DOB 03-09-1980, MRN [id number]  PCP:  Esperanza Richters, PA-C  Cardiologist:  Gypsy Balsam, MD    Referring MD: Marisue Brooklyn   No chief complaint on file. Doing well  History of Present Illness:    Anthony Russell is a 41 y.o. male with past medical history significant for diabetes, anxiety with depression, essential hypertension, dyslipidemia.  He did have extensive evaluation for coronary artery disease likely due to testing were negative, coronary CT angio showed calcium score 0 and his coronary arteries were clean.  He comes today 2 months of follow-up he is doing amazingly well he changed his diet he cut down volume of food he lost many pounds and feels feeling dramatically better he seems to be very optimistic and happy today he is planning to start exercises on the regular basis which I strongly recommended  Past Medical History:  Diagnosis Date   Allergy    Allergy, unspecified not elsewhere classified    rx w/ OTC antihistamines PRN   Anxiety    Asthma    Atypical chest pain    recent neg eval w/ normal cxr/ekg   Depression    Diverticulosis    DM (diabetes mellitus) (HCC)    Dyspepsia    on protonix 40mg /d   Esophagitis    GERD (gastroesophageal reflux disease)    on protonix 40mg /d   HTN (hypertension)    Hyperlipidemia    on diet alone   IBS (irritable bowel syndrome)    Lumbar back pain    s/p lumbar laminectomy 2007 by DrCabbell   Migraines    Overweight(278.02)    weight Jan10=246#. .he was 225# in 9/07...diet and exercise was discussed   Sleep apnea    not wearing c-pap currently    Past Surgical History:  Procedure Laterality Date   DENTAL SURGERY     LUMBAR LAMINECTOMY  01/30/2005   DrCabbell   TEAR DUCT PROBING      Current Medications: Current Meds  Medication Sig   albuterol (VENTOLIN HFA) 108 (90 Base) MCG/ACT inhaler INHALE 2 PUFFS INTO THE LUNGS EVERY 6 HOURS  AS NEEDED FOR WHEEZING OR SHORTNESS OF BREATH (Patient taking differently: Inhale 2 puffs into the lungs every 6 (six) hours as needed for wheezing or shortness of breath.)   atorvastatin (LIPITOR) 10 MG tablet TAKE 1 TABLET(10 MG) BY MOUTH DAILY (Patient taking differently: Take 10 mg by mouth daily.)   baclofen (LIORESAL) 10 MG tablet Take 1 tablet (10 mg total) by mouth 2 (two) times daily.   calcium carbonate (OS-CAL) 1250 (500 Ca) MG chewable tablet Chew 2 tablets by mouth daily.   Continuous Blood Gluc Sensor (FREESTYLE LIBRE 3 SENSOR) MISC Place 1 sensor on the skin every 14 days. Use to check glucose continuously (Patient taking differently: 1 each by Other route every 14 (fourteen) days. Place 1 sensor on the skin every 14 days. Use to check glucose continuously)   dicyclomine (BENTYL) 10 MG capsule Take 1 capsule (10 mg total) by mouth 3 (three) times daily as needed for spasms (abdominal pain).   famotidine (PEPCID) 20 MG tablet Take 1 tablet (20 mg total) by mouth at bedtime.   fluticasone-salmeterol (ADVAIR HFA) 230-21 MCG/ACT inhaler Inhale 2 puffs into the lungs 2 (two) times daily.   Fluticasone-Umeclidin-Vilant (TRELEGY ELLIPTA) 200-62.5-25 MCG/ACT AEPB Inhale 1 puff into the lungs daily.   glycopyrrolate (ROBINUL) 2 MG tablet TAKE  1 TABLET BY MOUTH TWICE DAILY AS NEEDED FOR ABDOMINAL DISCOMFORT (Patient taking differently: Take 1 mg by mouth 2 (two) times daily as needed (abdominal discomfort).)   L-Arginine 500 MG CAPS Take 2 capsules by mouth 2 (two) times daily.   levocetirizine (XYZAL) 5 MG tablet Take 1 tablet (5 mg total) by mouth every evening.   metFORMIN (GLUCOPHAGE) 1000 MG tablet TAKE 1 TABLET(1000 MG) BY MOUTH TWICE DAILY WITH A MEAL (Patient taking differently: Take 1,000 mg by mouth 2 (two) times daily with a meal. TAKE 1 TABLET(1000 MG) BY MOUTH TWICE DAILY WITH A MEAL)   Multiple Vitamin (MULTIVITAMIN ADULT PO) Take 2 tablets by mouth daily.   Nutritional Supplements  (PYCNOGENOL) 300-30 MG CAPS Take 1 capsule by mouth 3 (three) times daily.   pantoprazole (PROTONIX) 40 MG tablet Take 1 tablet (40 mg total) by mouth 2 (two) times daily before a meal.   propranolol (INDERAL) 20 MG tablet Take 1 tablet (20 mg total) by mouth 2 (two) times daily.     Allergies:   Patient has no known allergies.   Social History   Socioeconomic History   Marital status: Single    Spouse name: Not on file   Number of children: 0   Years of education: Not on file   Highest education level: Not on file  Occupational History   Occupation: Systems analyst  Tobacco Use   Smoking status: Former    Packs/day: 0.10    Years: 8.00    Total pack years: 0.80    Types: Cigarettes    Quit date: 03/03/2011    Years since quitting: 10.4   Smokeless tobacco: Never  Vaping Use   Vaping Use: Never used  Substance and Sexual Activity   Alcohol use: Yes    Comment: once or twice a month   Drug use: No   Sexual activity: Not on file  Other Topics Concern   Not on file  Social History Narrative   3 cups caffeine a day   Social Determinants of Health   Financial Resource Strain: Not on file  Food Insecurity: Not on file  Transportation Needs: Not on file  Physical Activity: Not on file  Stress: Not on file  Social Connections: Not on file     Family History: The patient's family history includes Allergies in his brother, father, and mother; Breast cancer in his maternal aunt; Diabetes in his father and mother; Heart disease in his brother; Hyperlipidemia in his father; Irritable bowel syndrome in his brother and mother. There is no history of Colon cancer, Esophageal cancer, Rectal cancer, or Stomach cancer. ROS:   Please see the history of present illness.    All 14 point review of systems negative except as described per history of present illness  EKGs/Labs/Other Studies Reviewed:      Recent Labs: 02/11/2021: Hemoglobin 14.7; Platelets 186 06/15/2021: ALT 22;  BUN 9; Creatinine, Ser 0.90; Potassium 4.0; Sodium 143  Recent Lipid Panel    Component Value Date/Time   CHOL 174 04/29/2020 0838   TRIG 190.0 (H) 04/29/2020 0838   HDL 30.50 (L) 04/29/2020 0838   CHOLHDL 6 04/29/2020 0838   VLDL 38.0 04/29/2020 0838   LDLCALC 106 (H) 04/29/2020 0838   LDLDIRECT 132.0 08/14/2019 1103    Physical Exam:    VS:  BP 110/64 (BP Location: Left Arm, Patient Position: Sitting)   Pulse 80   Ht 5\' 10"  (1.778 m)   Wt 207 lb 0.6 oz (93.9  kg)   SpO2 98%   BMI 29.71 kg/m     Wt Readings from Last 3 Encounters:  08/11/21 207 lb 0.6 oz (93.9 kg)  08/10/21 209 lb 12.8 oz (95.2 kg)  08/03/21 206 lb 8 oz (93.7 kg)     GEN:  Well nourished, well developed in no acute distress HEENT: Normal NECK: No JVD; No carotid bruits LYMPHATICS: No lymphadenopathy CARDIAC: RRR, no murmurs, no rubs, no gallops RESPIRATORY:  Clear to auscultation without rales, wheezing or rhonchi  ABDOMEN: Soft, non-tender, non-distended MUSCULOSKELETAL:  No edema; No deformity  SKIN: Warm and dry LOWER EXTREMITIES: no swelling NEUROLOGIC:  Alert and oriented x 3 PSYCHIATRIC:  Normal affect   ASSESSMENT:    1. Primary hypertension   2. Mixed hyperlipidemia   3. Type 2 diabetes mellitus without complication, without long-term current use of insulin (HCC)    PLAN:    In order of problems listed above:  Essential hypertension blood pressure well controlled continue present management. Diabetes mellitus.  Since he lost significant mount of weight he required less medication to control his sugar.  I encouraged him to be more active which should help as well. Mixed dyslipidemia I did review K PN which show me his LDL of 106 HDL 30.  We will make arrangements for another fasting lipid profile to be done Denies having any chest pain.  Doing well from cardiac standpoint review I see him back in 1 year   Medication Adjustments/Labs and Tests Ordered: Current medicines are reviewed at  length with the patient today.  Concerns regarding medicines are outlined above.  No orders of the defined types were placed in this encounter.  Medication changes: No orders of the defined types were placed in this encounter.   Signed, Georgeanna Lea, MD, [facility] 08/11/2021 9:13 AM    [facility]

## 2021-08-11 NOTE — Patient Instructions (Signed)

## 2021-08-21 ENCOUNTER — Encounter: Payer: Self-pay | Admitting: Medical

## 2021-08-22 ENCOUNTER — Encounter: Payer: Self-pay | Admitting: Internal Medicine

## 2021-09-05 ENCOUNTER — Encounter: Payer: Self-pay | Admitting: Medical

## 2021-09-05 ENCOUNTER — Ambulatory Visit (INDEPENDENT_AMBULATORY_CARE_PROVIDER_SITE_OTHER): Payer: BC Managed Care – PPO | Admitting: Medical

## 2021-09-05 VITALS — BP 130/82 | HR 82 | Temp 98.1°F | Ht 70.0 in | Wt 212.0 lb

## 2021-09-05 DIAGNOSIS — E1169 Type 2 diabetes mellitus with other specified complication: Secondary | ICD-10-CM

## 2021-09-05 DIAGNOSIS — E119 Type 2 diabetes mellitus without complications: Secondary | ICD-10-CM

## 2021-09-05 DIAGNOSIS — E785 Hyperlipidemia, unspecified: Secondary | ICD-10-CM

## 2021-09-05 DIAGNOSIS — M25511 Pain in right shoulder: Secondary | ICD-10-CM | POA: Diagnosis not present

## 2021-09-05 NOTE — Patient Instructions (Addendum)
Acute rt shoulder pain approaching 2 weeks. Seemed to occur around time lifting water and gatorade at same time. Advise using 2 tab of dual action ibuprfoen and tylenol otc every 6-8 hours. Will go ahead and refer you to sports med.  Toradol 30 mg IM injection today. Restart dual action ibuprofen/tylenol tomorrow.  Do recommend changing cgm site as you think maybe a factor in pain. If so woud be neuropathic type pain.  Follow up 1st week of September for diabetes and high cholesterol or sooner if needed.. Will place future lab for you to get at end of August.

## 2021-09-05 NOTE — Progress Notes (Signed)
Subjective:    Patient ID: Anthony Russell, male    DOB: Jun 27, 1980, 41 y.o.   MRN: [id number]  HPI Pt states he has some pain and tingling in his rt arm. He describes pain in rt upper arm, shoulder, sometimes in forearm and sometimes in fingers. Pt states pain occurred over week ago. Pt wonders if maybe related to CGM. He had cgm now for over a month. Always used it on rt side.   Pt not reporting any neck pain.   Pt states a little over week a carrying a case of water and 8 pack of gatorade.  Pt took dual action advil and tylenol. Pain level 5/10.     Review of Systems  Constitutional:  Negative for chills, fatigue and fever.  Respiratory:  Negative for cough, chest tightness, shortness of breath and wheezing.   Cardiovascular:  Negative for chest pain and palpitations.  Gastrointestinal:  Negative for abdominal pain, diarrhea, rectal pain and vomiting.  Genitourinary:  Negative for dysuria, flank pain, genital sores and hematuria.  Musculoskeletal:  Negative for back pain and joint swelling.  Skin:  Negative for rash.    Past Medical History:  Diagnosis Date   Allergy    Allergy, unspecified not elsewhere classified    rx w/ OTC antihistamines PRN   Anxiety    Asthma    Atypical chest pain    recent neg eval w/ normal cxr/ekg   Depression    Diverticulosis    DM (diabetes mellitus) (HCC)    Dyspepsia    on protonix 40mg /d   Esophagitis    GERD (gastroesophageal reflux disease)    on protonix 40mg /d   HTN (hypertension)    Hyperlipidemia    on diet alone   IBS (irritable bowel syndrome)    Lumbar back pain    s/p lumbar laminectomy 2007 by DrCabbell   Migraines    Overweight(278.02)    weight Jan10=246#. .he was 225# in 9/07...diet and exercise was discussed   Sleep apnea    not wearing c-pap currently     Social History   Socioeconomic History   Marital status: Single    Spouse name: Not on file   Number of children: 0   Years of education: Not on file    Highest education level: Not on file  Occupational History   Occupation: [name]  Tobacco Use   Smoking status: Former    Packs/day: 0.10    Years: 8.00    Total pack years: 0.80    Types: Cigarettes    Quit date: 03/03/2011    Years since quitting: 10.5   Smokeless tobacco: Never  Vaping Use   Vaping Use: Never used  Substance and Sexual Activity   Alcohol use: Yes    Comment: once or twice a month   Drug use: No   Sexual activity: Not on file  Other Topics Concern   Not on file  Social History Narrative   3 cups caffeine a day   Social Determinants of Health   Financial Resource Strain: Not on file  Food Insecurity: Not on file  Transportation Needs: Not on file  Physical Activity: Not on file  Stress: Not on file  Social Connections: Not on file  Intimate Partner Violence: Not on file    Past Surgical History:  Procedure Laterality Date   DENTAL SURGERY     LUMBAR LAMINECTOMY  01/30/2005   DrCabbell   TEAR DUCT PROBING      Family  History  Problem Relation Age of Onset   Allergies Mother    Irritable bowel syndrome Mother    Diabetes Mother    Allergies Father    Diabetes Father    Hyperlipidemia Father    Irritable bowel syndrome Brother    Heart disease Brother    Allergies Brother    Breast cancer Maternal Aunt    Colon cancer Neg Hx    Esophageal cancer Neg Hx    Rectal cancer Neg Hx    Stomach cancer Neg Hx     No Known Allergies  Current Outpatient Medications on File Prior to Visit  Medication Sig Dispense Refill   albuterol (VENTOLIN HFA) 108 (90 Base) MCG/ACT inhaler INHALE 2 PUFFS INTO THE LUNGS EVERY 6 HOURS AS NEEDED FOR WHEEZING OR SHORTNESS OF BREATH (Patient taking differently: Inhale 2 puffs into the lungs every 6 (six) hours as needed for wheezing or shortness of breath.) 20.1 g 0   atorvastatin (LIPITOR) 10 MG tablet TAKE 1 TABLET(10 MG) BY MOUTH DAILY (Patient taking differently: Take 10 mg by mouth daily.) 90 tablet  3   baclofen (LIORESAL) 10 MG tablet Take 1 tablet (10 mg total) by mouth 2 (two) times daily. 30 each 3   calcium carbonate (OS-CAL) 1250 (500 Ca) MG chewable tablet Chew 2 tablets by mouth daily.     dicyclomine (BENTYL) 10 MG capsule Take 1 capsule (10 mg total) by mouth 3 (three) times daily as needed for spasms (abdominal pain). 30 capsule 0   famotidine (PEPCID) 20 MG tablet Take 1 tablet (20 mg total) by mouth at bedtime. 30 tablet 3   fluticasone-salmeterol (ADVAIR HFA) 230-21 MCG/ACT inhaler Inhale 2 puffs into the lungs 2 (two) times daily. 1 each 3   glycopyrrolate (ROBINUL) 2 MG tablet TAKE 1 TABLET BY MOUTH TWICE DAILY AS NEEDED FOR ABDOMINAL DISCOMFORT (Patient taking differently: Take 1 mg by mouth 2 (two) times daily as needed (abdominal discomfort).) 60 tablet 2   L-Arginine 500 MG CAPS Take 2 capsules by mouth 2 (two) times daily.     levocetirizine (XYZAL) 5 MG tablet Take 1 tablet (5 mg total) by mouth every evening. 30 tablet 3   metFORMIN (GLUCOPHAGE) 1000 MG tablet TAKE 1 TABLET(1000 MG) BY MOUTH TWICE DAILY WITH A MEAL (Patient taking differently: Take 1,000 mg by mouth 2 (two) times daily with a meal. TAKE 1 TABLET(1000 MG) BY MOUTH TWICE DAILY WITH A MEAL) 180 tablet 1   Multiple Vitamin (MULTIVITAMIN ADULT PO) Take 2 tablets by mouth daily.     Nutritional Supplements (PYCNOGENOL) 300-30 MG CAPS Take 1 capsule by mouth 3 (three) times daily.     pantoprazole (PROTONIX) 40 MG tablet Take 1 tablet (40 mg total) by mouth 2 (two) times daily before a meal. 60 tablet 6   propranolol (INDERAL) 20 MG tablet Take 1 tablet (20 mg total) by mouth 2 (two) times daily. 60 tablet 3   Continuous Blood Gluc Sensor (FREESTYLE LIBRE 3 SENSOR) MISC Place 1 sensor on the skin every 14 days. Use to check glucose continuously (Patient not taking: Reported on 09/05/2021) 2 each 1   No current facility-administered medications on file prior to visit.    BP 130/82   Pulse 82   Temp 98.1 F  (36.7 C) (Oral)   Ht 5\' 10"  (1.778 m)   Wt 212 lb (96.2 kg)   SpO2 98%   BMI 30.42 kg/m        Objective:   Physical  Exam  General Mental Status- Alert. General Appearance- Not in acute distress.   Skin No redness, no swelling  no warmth over prior cgm site    Chest and Lung Exam Auscultation: Breath Sounds:-Normal.  Cardiovascular Auscultation:Rythm- Regular. Murmurs & Other Heart Sounds:Auscultation of the heart reveals- No Murmurs.     Neurologic Cranial Nerve exam:- CN III-XII intact(No nystagmus), symmetric smile. Strength:- 5/5 equal and symmetric strength both upper and lower extremities.    Rt shoulder- pain on abduction when upper ext gets to shoulder level. Rt trap pain and anterior aspect pain on palpation.        Assessment & Plan:   Patient Instructions  Acute rt shoulder pain approaching 2 weeks. Seemed to occur around time lifting water and gatorade at same time. Advise using 2 tab of dual action ibuprfoen and tylenol otc every 6-8 hours. Will go ahead and refer you to sports med.  Do recommend chaning cgm site as you think maybe a factor in pain. If so woud be neuropathic type pain.  Follow up 1st week of September for diabetes and high cholesterol or sooner if needed.. Will place future lab for you to get at end of August.

## 2021-09-09 MED ORDER — KETOROLAC TROMETHAMINE 30 MG/ML IJ SOLN
30.0000 mg | Freq: Once | INTRAMUSCULAR | Status: AC
  Administered 2021-09-05: 30 mg via INTRAMUSCULAR

## 2021-09-09 NOTE — Addendum Note (Signed)
Addended by: Lisbeth Renshaw, Calleen Alvis HUA on: 09/09/2021 02:01 PM   Modules accepted: Orders

## 2021-09-13 ENCOUNTER — Other Ambulatory Visit: Payer: Self-pay | Admitting: Medical

## 2021-09-29 ENCOUNTER — Other Ambulatory Visit: Payer: BC Managed Care – PPO

## 2021-10-04 ENCOUNTER — Ambulatory Visit: Payer: BC Managed Care – PPO | Admitting: Internal Medicine

## 2021-10-11 ENCOUNTER — Ambulatory Visit: Payer: BC Managed Care – PPO | Admitting: Family Medicine

## 2021-10-25 ENCOUNTER — Other Ambulatory Visit: Payer: Self-pay

## 2021-10-25 MED ORDER — METFORMIN HCL 1000 MG PO TABS: 1000.0000 mg | ORAL_TABLET | Freq: Two times a day (BID) | ORAL | 2 refills | Status: DC

## 2021-10-27 ENCOUNTER — Other Ambulatory Visit (INDEPENDENT_AMBULATORY_CARE_PROVIDER_SITE_OTHER): Payer: BC Managed Care – PPO

## 2021-10-27 DIAGNOSIS — E119 Type 2 diabetes mellitus without complications: Secondary | ICD-10-CM | POA: Diagnosis not present

## 2021-10-27 DIAGNOSIS — E785 Hyperlipidemia, unspecified: Secondary | ICD-10-CM | POA: Diagnosis not present

## 2021-10-27 DIAGNOSIS — E1169 Type 2 diabetes mellitus with other specified complication: Secondary | ICD-10-CM

## 2021-10-27 LAB — LIPID PANEL
Cholesterol: 182 mg/dL (ref 0–200)
HDL: 37.6 mg/dL — ABNORMAL LOW (ref >39.00)
LDL Cholesterol: 117 mg/dL — ABNORMAL HIGH (ref 0–99)
NonHDL: 144.38
Total CHOL/HDL Ratio: 5
Triglycerides: 139 mg/dL (ref 0.0–149.0)
VLDL: 27.8 mg/dL (ref 0.0–40.0)

## 2021-10-27 LAB — COMPREHENSIVE METABOLIC PANEL
ALT: 24 U/L (ref 0–53)
AST: 16 U/L (ref 0–37)
Albumin: 4.5 g/dL (ref 3.5–5.2)
Alkaline Phosphatase: 50 U/L (ref 39–117)
BUN: 12 mg/dL (ref 6–23)
CO2: 33 mEq/L — ABNORMAL HIGH (ref 19–32)
Calcium: 9.5 mg/dL (ref 8.4–10.5)
Chloride: 103 mEq/L (ref 96–112)
Creatinine, Ser: 0.97 mg/dL (ref 0.40–1.50)
GFR: 97.22 mL/min (ref >60.00)
Glucose, Bld: 83 mg/dL (ref 70–99)
Potassium: 3.9 mEq/L (ref 3.5–5.1)
Sodium: 144 mEq/L (ref 135–145)
Total Bilirubin: 0.4 mg/dL (ref 0.2–1.2)
Total Protein: 6.9 g/dL (ref 6.0–8.3)

## 2021-10-27 LAB — HEMOGLOBIN A1C: Hgb A1c MFr Bld: 5.9 % (ref 4.6–6.5)

## 2021-11-04 ENCOUNTER — Encounter: Payer: Self-pay | Admitting: Family Medicine

## 2021-11-04 ENCOUNTER — Ambulatory Visit: Payer: Self-pay

## 2021-11-04 ENCOUNTER — Ambulatory Visit (INDEPENDENT_AMBULATORY_CARE_PROVIDER_SITE_OTHER): Payer: BC Managed Care – PPO | Admitting: Family Medicine

## 2021-11-04 VITALS — BP 109/69 | Ht 70.0 in | Wt 212.0 lb

## 2021-11-04 DIAGNOSIS — M778 Other enthesopathies, not elsewhere classified: Secondary | ICD-10-CM | POA: Diagnosis not present

## 2021-11-04 DIAGNOSIS — M7581 Other shoulder lesions, right shoulder: Secondary | ICD-10-CM | POA: Insufficient documentation

## 2021-11-04 DIAGNOSIS — M25511 Pain in right shoulder: Secondary | ICD-10-CM

## 2021-11-04 MED ORDER — DICLOFENAC SODIUM 2 % EX SOLN
2.0000 | Freq: Two times a day (BID) | CUTANEOUS | 2 refills | Status: DC
Start: 2021-11-04 — End: 2022-05-24

## 2021-11-04 NOTE — Assessment & Plan Note (Signed)
Acutely occurring.  Symptoms most consistent with a capsulitis -Counseled on home exercise therapy and supportive care. -Pennsaid. -Could consider injection or physical therapy.

## 2021-11-04 NOTE — Progress Notes (Signed)
  HILBERT BRIGGS - 41 y.o. male MRN [id number]  Date of birth: 1980/03/10  SUBJECTIVE:  Including CC & ROS.  No chief complaint on file.   KAMORI BARBIER is a 41 y.o. male that is presenting with acute right shoulder pain.  The pain is occurring anytime that he reaches out across his body.    Review of Systems See HPI   HISTORY: Past Medical, Surgical, Social, and Family History Reviewed & Updated per EMR.   Pertinent Historical Findings include:  Past Medical History:  Diagnosis Date   Allergy    Allergy, unspecified not elsewhere classified    rx w/ OTC antihistamines PRN   Anxiety    Asthma    Atypical chest pain    recent neg eval w/ normal cxr/ekg   Depression    Diverticulosis    DM (diabetes mellitus) ([place])    Dyspepsia    on protonix 40mg /d   Esophagitis    GERD (gastroesophageal reflux disease)    on protonix 40mg /d   HTN (hypertension)    Hyperlipidemia    on diet alone   IBS (irritable bowel syndrome)    Lumbar back pain    s/p lumbar laminectomy 2007 by DrCabbell   Migraines    Overweight(278.02)    weight Jan10=246#.[name].he was 225# in 9/07...diet and exercise was discussed   Sleep apnea    not wearing c-pap currently    Past Surgical History:  Procedure Laterality Date   DENTAL SURGERY     LUMBAR LAMINECTOMY  01/30/2005   DrCabbell   TEAR DUCT PROBING       PHYSICAL EXAM:  VS: BP 109/69 (BP Location: Left Arm, Patient Position: Sitting)   Ht 5\' 10"  (1.778 m)   Wt 212 lb (96.2 kg)   BMI 30.42 kg/m  Physical Exam Gen: NAD, alert, cooperative with exam, well-appearing MSK:  Neurovascularly intact    Limited ultrasound: Right shoulder:  Normal-appearing biceps tendon. No changes of the subscapularis or supraspinatus. There is thickening of the coracohumeral ligament and the deltoid ligament  Summary: Findings consistent with capsulitis.  Ultrasound and interpretation by Clearance Coots, MD    ASSESSMENT & PLAN:   Capsulitis of right  shoulder Acutely occurring.  Symptoms most consistent with a capsulitis -Counseled on home exercise therapy and supportive care. -Pennsaid. -Could consider injection or physical therapy.

## 2021-11-04 NOTE — Patient Instructions (Signed)
Good to see you Please try heat  Please try the exercises   I sent in a rub on medicine to try  Please send me a message in MyChart with any questions or updates.  Please see me back in 4 weeks.   --Dr. Raeford Razor

## 2021-11-06 IMAGING — CT CT ANGIO NECK
1 series · 7 of 9 positions shown · IV contrast (omnipaque)
Comparison: None.

CLINICAL DATA: 40-year-old male with persistent headache. Sudden
onset severe headache during intercourse 3 days ago which is
unresolved. Diabetic.

EXAM:
CT ANGIOGRAPHY HEAD AND NECK
TECHNIQUE: Multidetector CT imaging of the head and neck was performed using
the standard protocol during bolus administration of intravenous
contrast. Multiplanar CT image reconstructions and MIPs were
obtained to evaluate the vascular anatomy. Carotid stenosis
measurements (when applicable) are obtained utilizing NASCET
criteria, using the distal internal carotid diameter as the
denominator.
CONTRAST:  75mL OMNIPAQUE IOHEXOL 350 MG/ML SOLN

[Series 4: monitoring 10.0 b31s · axial · 0.39mm/px · z∈[-344,-344]mm · 7 of 9 slices shown]
[im 2/9  soft-tissue]
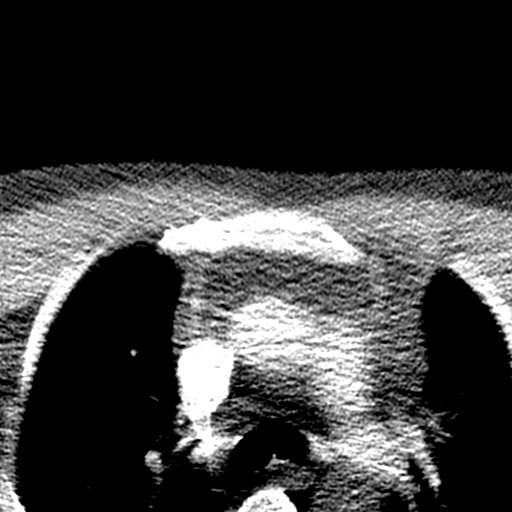
[im 3/9  bone]
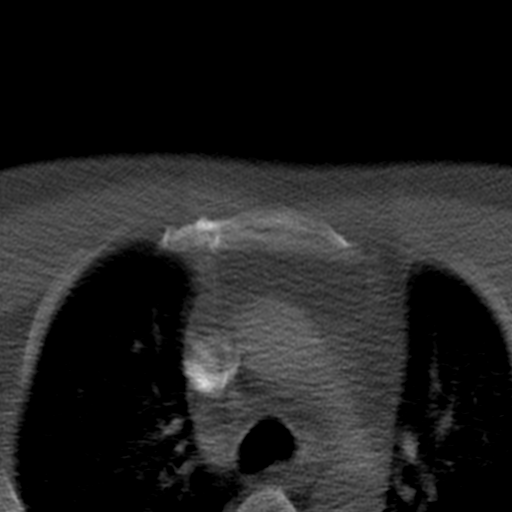
[im 4/9  soft-tissue]
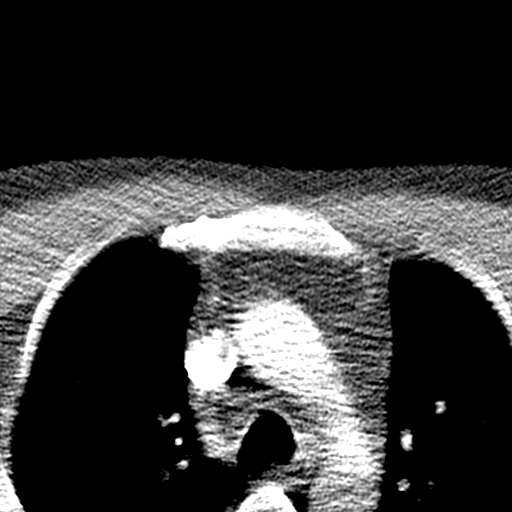
[im 5/9  bone]
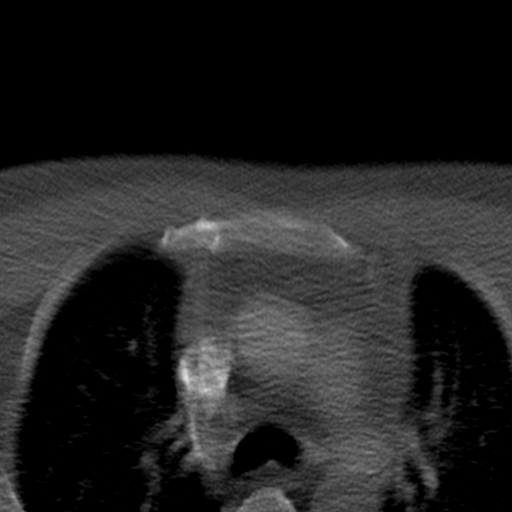
[im 6/9  soft-tissue]
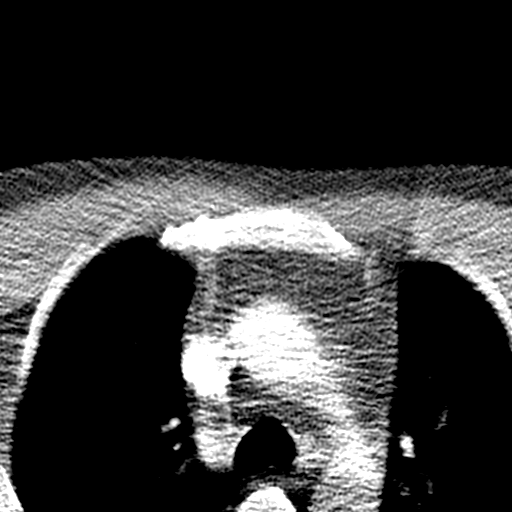
[im 7/9  bone]
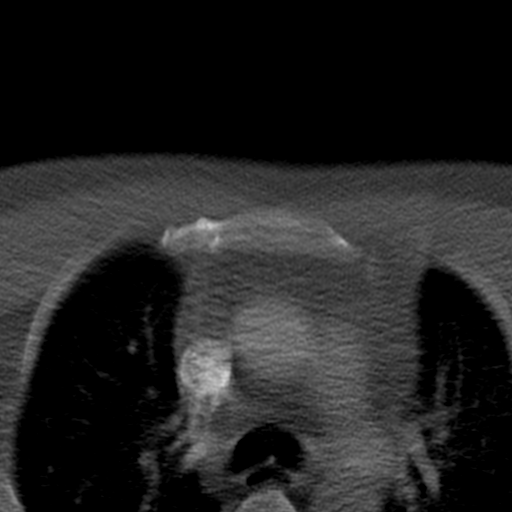
[im 8/9  soft-tissue]
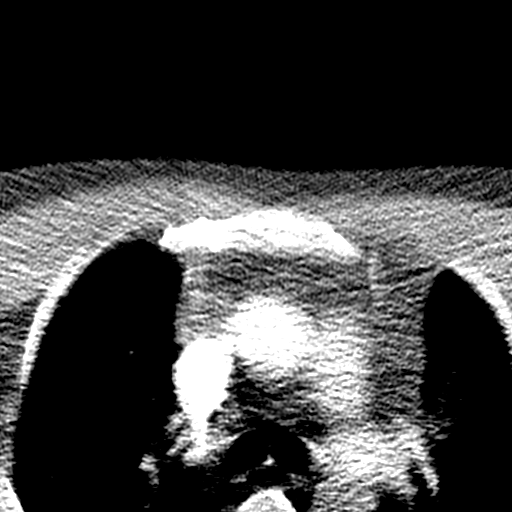

[7 of 9 positions shown; findings below may reference images not displayed]

FINDINGS: CT HEAD

Brain: Normal cerebral volume. No midline shift, ventriculomegaly,
mass effect, evidence of mass lesion, intracranial hemorrhage or
evidence of cortically based acute infarction. Gray-white matter
differentiation is within normal limits throughout the brain.

Calvarium and skull base: No acute osseous abnormality identified.
Hyperostosis of the calvarium, frequently a normal variant.

Paranasal sinuses: Visualized paranasal sinuses and mastoids are
clear.

Orbits: Visualized orbits and scalp soft tissues are within normal
limits.

CTA NECK

Skeleton: Absent dentition with dental implants. Ossification of the
posterior longitudinal ligament in the cervical spine most
pronounced at C2-C3 and C3-C4 (series 13, image 105). Associated
mild spinal stenosis. No acute osseous abnormality identified.

Upper chest: Negative.

Other neck: Slight asymmetry of the palatine tonsils greater on the
left (series 10, image 142). But no masslike enhancement. Remaining
pharyngeal contours, parapharyngeal and retropharyngeal spaces
appear negative. No cervical lymphadenopathy.

Aortic arch: 3 vessel arch configuration with no arch
atherosclerosis.

Right carotid system: Negative.

Left carotid system: Negative.

Vertebral arteries:
Minimal calcified plaque in the proximal right subclavian artery.
Normal right vertebral artery origin. Right vertebral artery appears
patent and normal to the skull base.

Normal proximal left subclavian artery and left vertebral artery
origin. Left vertebral artery appears mildly dominant, patent, and
normal to the skull base.

CTA HEAD

Suboptimal but adequate intracranial arterial contrast. Intracranial
venous contrast is dominant.

Posterior circulation: Dominant left vertebral V4 segment. Patent
distal vertebral arteries and PICA origins with no plaque or
stenosis. Patent vertebrobasilar junction and basilar artery without
stenosis. SCA and PCA origins are within normal limits. Tortuous
left P1 segment. Posterior communicating arteries are diminutive or
absent. Bilateral PCA branches are within normal limits.

Anterior circulation: Both ICA siphons are patent with no siphon
plaque or stenosis identified. Patent carotid termini. Patent MCA
and ACA origins. Anterior communicating artery and bilateral ACA
branches are within normal limits. Left MCA M1 segment and visible
left MCA branches are within normal limits. Right MCA M1 segment and
visible branches are within normal limits.

Venous sinuses: Patent, with venous dominant contrast timing inside
the skull.

Anatomic variants: Dominant left vertebral artery.

Review of the MIP images confirms the above findings
IMPRESSION: 1. Suboptimal intracranial arterial contrast bolus but no
intracranial aneurysm identified, and normal arterial findings on
CTA head and neck.

2. Normal CT appearance of the brain.

3. Asymmetry of the palatine tonsils, larger on the left. No
mass-like enhancement or cervical lymphadenopathy. Still, follow-up
with ENT for direct inspection of the tonsil would be ideal.

4. Ossification of the posterior longitudinal ligament (OPLL) in the
cervical spine with associated mild spinal stenosis.

## 2021-12-01 ENCOUNTER — Telehealth: Payer: Self-pay | Admitting: Medical

## 2021-12-01 MED ORDER — FREESTYLE LIBRE 3 SENSOR MISC: 1 refills | Status: DC

## 2021-12-01 NOTE — Telephone Encounter (Signed)
Medication: Continuous Blood Gluc Sensor (FREESTYLE LIBRE 3 SENSOR) MISC   Has the patient contacted their pharmacy? Yes.   Refill was not approved   Preferred Pharmacy (with phone number or street name): [id number] - [place], [place] - [address] [place], [place] [address] Phone: [phone]  Fax: [phone]   Agent: Please be advised that RX refills may take up to 3 business days. We ask that you follow-up with your pharmacy.

## 2021-12-01 NOTE — Telephone Encounter (Signed)
Rx sent 

## 2021-12-09 ENCOUNTER — Encounter: Payer: Self-pay | Admitting: Physician Assistant

## 2021-12-09 ENCOUNTER — Ambulatory Visit: Payer: BC Managed Care – PPO | Admitting: Family Medicine

## 2021-12-09 ENCOUNTER — Telehealth: Payer: BC Managed Care – PPO | Admitting: Physician Assistant

## 2021-12-09 DIAGNOSIS — Z20822 Contact with and (suspected) exposure to covid-19: Secondary | ICD-10-CM | POA: Diagnosis not present

## 2021-12-09 MED ORDER — AZELASTINE HCL 0.1 % NA SOLN: 1.0000 | Freq: Two times a day (BID) | NASAL | 0 refills | Status: DC

## 2021-12-09 MED ORDER — IBUPROFEN 600 MG PO TABS: 600.0000 mg | ORAL_TABLET | Freq: Three times a day (TID) | ORAL | 0 refills | Status: DC | PRN

## 2021-12-09 MED ORDER — PROMETHAZINE-DM 6.25-15 MG/5ML PO SYRP: 5.0000 mL | ORAL_SOLUTION | Freq: Four times a day (QID) | ORAL | 0 refills | Status: DC | PRN

## 2021-12-09 MED ORDER — NIRMATRELVIR/RITONAVIR (PAXLOVID)TABLET: 3.0000 | ORAL_TABLET | Freq: Two times a day (BID) | ORAL | 0 refills | Status: AC

## 2021-12-09 MED ORDER — BENZONATATE 100 MG PO CAPS: 100.0000 mg | ORAL_CAPSULE | Freq: Three times a day (TID) | ORAL | 0 refills | Status: DC | PRN

## 2021-12-09 NOTE — Progress Notes (Addendum)
Virtual Visit Consent   Anthony Russell, you are scheduled for a virtual visit with a [facility] provider today. Just as with appointments in the office, your consent must be obtained to participate. Your consent will be active for this visit and any virtual visit you may have with one of our providers in the next 365 days. If you have a MyChart account, a copy of this consent can be sent to you electronically.  As this is a virtual visit, video technology does not allow for your provider to perform a traditional examination. This may limit your provider's ability to fully assess your condition. If your provider identifies any concerns that need to be evaluated in person or the need to arrange testing (such as labs, EKG, etc.), we will make arrangements to do so. Although advances in technology are sophisticated, we cannot ensure that it will always work on either your end or our end. If the connection with a video visit is poor, the visit may have to be switched to a telephone visit. With either a video or telephone visit, we are not always able to ensure that we have a secure connection.  By engaging in this virtual visit, you consent to the provision of healthcare and authorize for your insurance to be billed (if applicable) for the services provided during this visit. Depending on your insurance coverage, you may receive a charge related to this service.  I need to obtain your verbal consent now. Are you willing to proceed with your visit today? Anthony Russell has provided verbal consent on 12/09/2021 for a virtual visit (video or telephone). Mar Daring, PA-C  Date: 12/09/2021 9:22 AM  Virtual Visit via Video Note   I, Mar Daring, connected with  Anthony Russell  ([id number], 01-10-1981) on 12/09/21 at  9:15 AM EST by a video-enabled telemedicine application and verified that I am speaking with the correct person using two identifiers.  Location: Patient: Virtual Visit Location  Patient: Home Provider: Virtual Visit Location Provider: Home Office   I discussed the limitations of evaluation and management by telemedicine and the availability of in person appointments. The patient expressed understanding and agreed to proceed.    History of Present Illness: Anthony Russell is a 41 y.o. who identifies as a male who was assigned male at birth, and is being seen today for URI symptoms and fevers.  HPI: URI  This is a new problem. The current episode started in the past 7 days (Wednesday). The problem has been unchanged. The maximum temperature recorded prior to his arrival was 102 - 102.9 F (101 wednesday, 102 thursday). The fever has been present for 1 to 2 days. Associated symptoms include chest pain, congestion, coughing (coughing spells), diarrhea, headaches, nausea, rhinorrhea, sinus pain and a sore throat. Pertinent negatives include no ear pain, plugged ear sensation or vomiting. Associated symptoms comments: myalgias. He has tried NSAIDs, acetaminophen and increased fluids (dayquil) for the symptoms. The treatment provided no relief.   Having covid and flu testing at 2:15pm.   Problems:  Patient Active Problem List   Diagnosis Date Noted   Capsulitis of right shoulder 11/04/2021   Encounter for screening for other metabolic disorders [id number]   Dyspnea on exertion 04/07/2021   Sleep apnea 02/23/2021   Migraines 02/23/2021   Lumbar back pain 02/23/2021   HTN (hypertension) 02/23/2021   Dyspepsia 02/23/2021   DM (diabetes mellitus) ([place]) 02/23/2021   Nasal septal deviation 02/17/2021   Laryngitis 01/19/2021  Asymmetric tonsils 12/01/2020   Subacromial bursitis of right shoulder joint 05/17/2020   Hypersomnia 11/03/2015   Obesity 11/03/2015   Acute bacterial bronchitis 01/18/2015   Allergic rhinitis 04/15/2014   Asthma in adult 04/15/2014   GERD (gastroesophageal reflux disease) 04/15/2014   IBS (irritable bowel syndrome) 04/01/2013   Nausea alone  01/15/2013   Diarrhea 01/15/2013   Abdominal cramping 01/15/2013   Depression 10/15/2012   Lesion of eyebrow 02/09/2012   Reactive airways dysfunction syndrome ([place]) 07/05/2011   Gastroparesis 07/05/2011   Anxiety 07/05/2010   TESTOSTERONE DEFICIENCY 05/26/2008   FATIGUE 05/25/2008   ABDOMINAL BLOATING 04/03/2008   Hyperlipidemia 02/06/2008   OVERWEIGHT 02/06/2008   Facet arthropathy, lumbar 02/06/2008   Atypical chest pain 02/06/2008   GERD 11/19/2007   ALLERGY 11/18/2007    Allergies: No Known Allergies Medications:  Current Outpatient Medications:    azelastine (ASTELIN) 0.1 % nasal spray, Place 1 spray into both nostrils 2 (two) times daily. Use in each nostril as directed, Disp: 30 mL, Rfl: 0   benzonatate (TESSALON) 100 MG capsule, Take 1 capsule (100 mg total) by mouth 3 (three) times daily as needed., Disp: 30 capsule, Rfl: 0   ibuprofen (ADVIL) 600 MG tablet, Take 1 tablet (600 mg total) by mouth every 8 (eight) hours as needed., Disp: 30 tablet, Rfl: 0   promethazine-dextromethorphan (PROMETHAZINE-DM) 6.25-15 MG/5ML syrup, Take 5 mLs by mouth 4 (four) times daily as needed., Disp: 118 mL, Rfl: 0   albuterol (VENTOLIN HFA) 108 (90 Base) MCG/ACT inhaler, INHALE 2 PUFFS INTO THE LUNGS EVERY 6 HOURS AS NEEDED FOR WHEEZING OR SHORTNESS OF BREATH (Patient taking differently: Inhale 2 puffs into the lungs every 6 (six) hours as needed for wheezing or shortness of breath.), Disp: 20.1 g, Rfl: 0   atorvastatin (LIPITOR) 10 MG tablet, TAKE 1 TABLET(10 MG) BY MOUTH DAILY (Patient taking differently: Take 10 mg by mouth daily.), Disp: 90 tablet, Rfl: 3   baclofen (LIORESAL) 10 MG tablet, Take 1 tablet (10 mg total) by mouth 2 (two) times daily., Disp: 30 each, Rfl: 3   calcium carbonate (OS-CAL) 1250 (500 Ca) MG chewable tablet, Chew 2 tablets by mouth daily., Disp: , Rfl:    Continuous Blood Gluc Sensor (FREESTYLE LIBRE 3 SENSOR) MISC, USE TO CHECK BLOOD GLUCOSE CONTINUOUSLY, CHANGE  SENSOR EVERY 14 DAYS, Disp: 2 each, Rfl: 1   diclofenac Sodium (PENNSAID) 2 % SOLN, Place 2 Pump (40 mg total) onto the skin 2 (two) times daily., Disp: 112 g, Rfl: 2   dicyclomine (BENTYL) 10 MG capsule, Take 1 capsule (10 mg total) by mouth 3 (three) times daily as needed for spasms (abdominal pain)., Disp: 30 capsule, Rfl: 0   famotidine (PEPCID) 20 MG tablet, Take 1 tablet (20 mg total) by mouth at bedtime., Disp: 30 tablet, Rfl: 3   fluticasone-salmeterol (ADVAIR HFA) 230-21 MCG/ACT inhaler, Inhale 2 puffs into the lungs 2 (two) times daily., Disp: 1 each, Rfl: 3   glycopyrrolate (ROBINUL) 2 MG tablet, TAKE 1 TABLET BY MOUTH TWICE DAILY AS NEEDED FOR ABDOMINAL DISCOMFORT (Patient taking differently: Take 1 mg by mouth 2 (two) times daily as needed (abdominal discomfort).), Disp: 60 tablet, Rfl: 2   L-Arginine 500 MG CAPS, Take 2 capsules by mouth 2 (two) times daily., Disp: , Rfl:    levocetirizine (XYZAL) 5 MG tablet, Take 1 tablet (5 mg total) by mouth every evening., Disp: 30 tablet, Rfl: 3   metFORMIN (GLUCOPHAGE) 1000 MG tablet, Take 1 tablet (1,000 mg total)  by mouth 2 (two) times daily with a meal. TAKE 1 TABLET(1000 MG) BY MOUTH TWICE DAILY WITH A MEAL, Disp: 30 tablet, Rfl: 2   Multiple Vitamin (MULTIVITAMIN ADULT PO), Take 2 tablets by mouth daily., Disp: , Rfl:    Nutritional Supplements (PYCNOGENOL) 300-30 MG CAPS, Take 1 capsule by mouth 3 (three) times daily., Disp: , Rfl:    pantoprazole (PROTONIX) 40 MG tablet, Take 1 tablet (40 mg total) by mouth 2 (two) times daily before a meal., Disp: 60 tablet, Rfl: 6   propranolol (INDERAL) 20 MG tablet, Take 1 tablet (20 mg total) by mouth 2 (two) times daily., Disp: 60 tablet, Rfl: 3  Observations/Objective: Patient is well-developed, well-nourished in no acute distress.  Resting comfortably at home.  Head is normocephalic, atraumatic.  No labored breathing.  Speech is clear and coherent with logical content.  Patient is alert and  oriented at baseline.    Assessment and Plan: 1. Suspected COVID-19 virus infection - promethazine-dextromethorphan (PROMETHAZINE-DM) 6.25-15 MG/5ML syrup; Take 5 mLs by mouth 4 (four) times daily as needed.  Dispense: 118 mL; Refill: 0 - benzonatate (TESSALON) 100 MG capsule; Take 1 capsule (100 mg total) by mouth 3 (three) times daily as needed.  Dispense: 30 capsule; Refill: 0 - azelastine (ASTELIN) 0.1 % nasal spray; Place 1 spray into both nostrils 2 (two) times daily. Use in each nostril as directed  Dispense: 30 mL; Refill: 0 - ibuprofen (ADVIL) 600 MG tablet; Take 1 tablet (600 mg total) by mouth every 8 (eight) hours as needed.  Dispense: 30 tablet; Refill: 0  - Continue OTC symptomatic management of choice - Will send OTC vitamins and supplement information through AVS - Ibuprofen, Promethazine DM, Tessalon, and Azelastine prescribed - Push fluids - Rest as needed - He will mychart update testing results - Discussed return precautions and when to seek in-person evaluation, sent via AVS as well  *Addend: Patient confirmed positive covid 19 testing. Paxlovid prescribed.  Follow Up Instructions: I discussed the assessment and treatment plan with the patient. The patient was provided an opportunity to ask questions and all were answered. The patient agreed with the plan and demonstrated an understanding of the instructions.  A copy of instructions were sent to the patient via MyChart unless otherwise noted below.    The patient was advised to call back or seek an in-person evaluation if the symptoms worsen or if the condition fails to improve as anticipated.  Time:  I spent 10 minutes with the patient via telehealth technology discussing the above problems/concerns.    Margaretann Loveless, PA-C

## 2021-12-09 NOTE — Addendum Note (Signed)
Addended by: Margaretann Loveless on: 12/09/2021 02:59 PM   Modules accepted: Orders

## 2021-12-09 NOTE — Patient Instructions (Signed)
Anthony Russell, thank you for joining Margaretann Loveless, PA-C for today's virtual visit.  While this provider is not your primary care provider (PCP), if your PCP is located in our provider database this encounter information will be shared with them immediately following your visit.   A [facility] MyChart account gives you access to today's visit and all your visits, tests, and labs performed at [facility] " click here if you don't have a [facility] MyChart account or go to [email or web address]  Consent: (Patient) Anthony Russell provided verbal consent for this virtual visit at the beginning of the encounter.  Current Medications:  Current Outpatient Medications:    azelastine (ASTELIN) 0.1 % nasal spray, Place 1 spray into both nostrils 2 (two) times daily. Use in each nostril as directed, Disp: 30 mL, Rfl: 0   benzonatate (TESSALON) 100 MG capsule, Take 1 capsule (100 mg total) by mouth 3 (three) times daily as needed., Disp: 30 capsule, Rfl: 0   ibuprofen (ADVIL) 600 MG tablet, Take 1 tablet (600 mg total) by mouth every 8 (eight) hours as needed., Disp: 30 tablet, Rfl: 0   promethazine-dextromethorphan (PROMETHAZINE-DM) 6.25-15 MG/5ML syrup, Take 5 mLs by mouth 4 (four) times daily as needed., Disp: 118 mL, Rfl: 0   albuterol (VENTOLIN HFA) 108 (90 Base) MCG/ACT inhaler, INHALE 2 PUFFS INTO THE LUNGS EVERY 6 HOURS AS NEEDED FOR WHEEZING OR SHORTNESS OF BREATH (Patient taking differently: Inhale 2 puffs into the lungs every 6 (six) hours as needed for wheezing or shortness of breath.), Disp: 20.1 g, Rfl: 0   atorvastatin (LIPITOR) 10 MG tablet, TAKE 1 TABLET(10 MG) BY MOUTH DAILY (Patient taking differently: Take 10 mg by mouth daily.), Disp: 90 tablet, Rfl: 3   baclofen (LIORESAL) 10 MG tablet, Take 1 tablet (10 mg total) by mouth 2 (two) times daily., Disp: 30 each, Rfl: 3   calcium carbonate (OS-CAL) 1250 (500 Ca) MG chewable tablet, Chew 2 tablets by mouth daily.,  Disp: , Rfl:    Continuous Blood Gluc Sensor (FREESTYLE LIBRE 3 SENSOR) MISC, USE TO CHECK BLOOD GLUCOSE CONTINUOUSLY, CHANGE SENSOR EVERY 14 DAYS, Disp: 2 each, Rfl: 1   diclofenac Sodium (PENNSAID) 2 % SOLN, Place 2 Pump (40 mg total) onto the skin 2 (two) times daily., Disp: 112 g, Rfl: 2   dicyclomine (BENTYL) 10 MG capsule, Take 1 capsule (10 mg total) by mouth 3 (three) times daily as needed for spasms (abdominal pain)., Disp: 30 capsule, Rfl: 0   famotidine (PEPCID) 20 MG tablet, Take 1 tablet (20 mg total) by mouth at bedtime., Disp: 30 tablet, Rfl: 3   fluticasone-salmeterol (ADVAIR HFA) 230-21 MCG/ACT inhaler, Inhale 2 puffs into the lungs 2 (two) times daily., Disp: 1 each, Rfl: 3   glycopyrrolate (ROBINUL) 2 MG tablet, TAKE 1 TABLET BY MOUTH TWICE DAILY AS NEEDED FOR ABDOMINAL DISCOMFORT (Patient taking differently: Take 1 mg by mouth 2 (two) times daily as needed (abdominal discomfort).), Disp: 60 tablet, Rfl: 2   L-Arginine 500 MG CAPS, Take 2 capsules by mouth 2 (two) times daily., Disp: , Rfl:    levocetirizine (XYZAL) 5 MG tablet, Take 1 tablet (5 mg total) by mouth every evening., Disp: 30 tablet, Rfl: 3   metFORMIN (GLUCOPHAGE) 1000 MG tablet, Take 1 tablet (1,000 mg total) by mouth 2 (two) times daily with a meal. TAKE 1 TABLET(1000 MG) BY MOUTH TWICE DAILY WITH A MEAL, Disp: 30 tablet, Rfl: 2   Multiple Vitamin (MULTIVITAMIN ADULT PO), Take  2 tablets by mouth daily., Disp: , Rfl:    Nutritional Supplements (PYCNOGENOL) 300-30 MG CAPS, Take 1 capsule by mouth 3 (three) times daily., Disp: , Rfl:    pantoprazole (PROTONIX) 40 MG tablet, Take 1 tablet (40 mg total) by mouth 2 (two) times daily before a meal., Disp: 60 tablet, Rfl: 6   propranolol (INDERAL) 20 MG tablet, Take 1 tablet (20 mg total) by mouth 2 (two) times daily., Disp: 60 tablet, Rfl: 3   Medications ordered in this encounter:  Meds ordered this encounter  Medications   promethazine-dextromethorphan  (PROMETHAZINE-DM) 6.25-15 MG/5ML syrup    Sig: Take 5 mLs by mouth 4 (four) times daily as needed.    Dispense:  118 mL    Refill:  0    Order Specific Question:   Supervising Provider    Answer:   Chase Picket [id number]   benzonatate (TESSALON) 100 MG capsule    Sig: Take 1 capsule (100 mg total) by mouth 3 (three) times daily as needed.    Dispense:  30 capsule    Refill:  0    Order Specific Question:   Supervising Provider    Answer:   Chase Picket [id number]   azelastine (ASTELIN) 0.1 % nasal spray    Sig: Place 1 spray into both nostrils 2 (two) times daily. Use in each nostril as directed    Dispense:  30 mL    Refill:  0    Order Specific Question:   Supervising Provider    Answer:   Chase Picket [id number]   ibuprofen (ADVIL) 600 MG tablet    Sig: Take 1 tablet (600 mg total) by mouth every 8 (eight) hours as needed.    Dispense:  30 tablet    Refill:  0    Order Specific Question:   Supervising Provider    Answer:   Chase Picket [id number]     *If you need refills on other medications prior to your next appointment, please contact your pharmacy*  Follow-Up: Call back or seek an in-person evaluation if the symptoms worsen or if the condition fails to improve as anticipated.  [place] [phone]  Other Instructions COVID-19 COVID-19, or coronavirus disease 2019, is an infection that is caused by a new (novel) coronavirus called SARS-CoV-2. COVID-19 can cause many symptoms. In some people, the virus may not cause any symptoms. In others, it may cause mild or severe symptoms. Some people with severe infection develop severe disease. What are the causes? This illness is caused by a virus. The virus may be in the air as tiny specks of fluid (aerosols) or droplets, or it may be on surfaces. You may catch the virus by: Breathing in droplets from an infected person. Droplets can be spread by a person breathing, speaking, singing, coughing,  or sneezing. Touching something, like a table or a doorknob, that has virus on it (is contaminated) and then touching your mouth, nose, or eyes. What increases the risk? Risk for infection: You are more likely to get infected with the COVID-19 virus if: You are within 6 ft (1.8 m) of a person with COVID-19 for 15 minutes or longer. You are providing care for a person who is infected with COVID-19. You are in close personal contact with other people. Close personal contact includes hugging, kissing, or sharing eating or drinking utensils. Risk for serious illness caused by COVID-19: You are more likely to get seriously ill from the COVID-19  virus if: You have cancer. You have a long-term (chronic) disease, such as: Chronic lung disease. This includes pulmonary embolism, chronic obstructive pulmonary disease, and cystic fibrosis. Long-term disease that lowers your body's ability to fight infection (immunocompromise). Serious cardiac conditions, such as heart failure, coronary artery disease, or cardiomyopathy. Diabetes. Chronic kidney disease. Liver diseases. These include cirrhosis, nonalcoholic fatty liver disease, alcoholic liver disease, or autoimmune hepatitis. You have obesity. You are pregnant or were recently pregnant. You have sickle cell disease. What are the signs or symptoms? Symptoms of this condition can range from mild to severe. Symptoms may appear any time from 2 to 14 days after being exposed to the virus. They include: Fever or chills. Shortness of breath or trouble breathing. Feeling tired or very tired. Headaches, body aches, or muscle aches. Runny or stuffy nose, sneezing, coughing, or sore throat. New loss of taste or smell. This is rare. Some people may also have stomach problems, such as nausea, vomiting, or diarrhea. Other people may not have any symptoms of COVID-19. How is this diagnosed? This condition may be diagnosed by testing samples to check for the  COVID-19 virus. The most common tests are the PCR test and the antigen test. Tests may be done in the lab or at home. They include: Using a swab to take a sample of fluid from the back of your nose and throat (nasopharyngeal fluid), from your nose, or from your throat. Testing a sample of saliva from your mouth. Testing a sample of coughed-up mucus from your lungs (sputum). How is this treated? Treatment for COVID-19 infection depends on the severity of the condition. Mild symptoms can be managed at home with rest, fluids, and over-the-counter medicines. Serious symptoms may be treated in a hospital intensive care unit (ICU). Treatment in the ICU may include: Supplemental oxygen. Extra oxygen is given through a tube in the nose, a face mask, or a hood. Medicines. These may include: Antivirals, such as monoclonal antibodies. These help your body fight off certain viruses that can cause disease. Anti-inflammatories, such as corticosteroids. These reduce inflammation and suppress the immune system. Antithrombotics. These prevent or treat blood clots, if they develop. Convalescent plasma. This helps boost your immune system, if you have an underlying immunosuppressive condition or are getting immunosuppressive treatments. Prone positioning. This means you will lie on your stomach. This helps oxygen to get into your lungs. Infection control measures. If you are at risk for more serious illness caused by COVID-19, your health care provider may prescribe two long-acting monoclonal antibodies, given together every 6 months. How is this prevented? To protect yourself: Use preventive medicine (pre-exposure prophylaxis). You may get pre-exposure prophylaxis if you have moderate or severe immunocompromise. Get vaccinated. Anyone 30 months old or older who meets guidelines can get a COVID-19 vaccine or vaccine series. This includes people who are pregnant or making breast milk (lactating). Get an added dose  of COVID-19 vaccine after your first vaccine or vaccine series if you have moderate to severe immunocompromise. This applies if you have had a solid organ transplant or have been diagnosed with an immunocompromising condition. You should get the added dose 4 weeks after you got the first COVID-19 vaccine or vaccine series. If you get an mRNA vaccine, you will need a 3-dose primary series. If you get the J&J/Janssen vaccine, you will need a 2-dose primary series, with the second dose being an mRNA vaccine. Talk to your health care provider about getting experimental monoclonal antibodies. This treatment is  approved under emergency use authorization to prevent severe illness before or after being exposed to the COVID-19 virus. You may be given monoclonal antibodies if: You have moderate or severe immunocompromise. This includes treatments that lower your immune response. People with immunocompromise may not develop protection against COVID-19 when they are vaccinated. You cannot be vaccinated. You may not get a vaccine if you have a severe allergic reaction to the vaccine or its components. You are not fully vaccinated. You are in a facility where COVID-19 is present and: Are in close contact with a person who is infected with the COVID-19 virus. Are at high risk of being exposed to the COVID-19 virus. You are at risk of illness from new variants of the COVID-19 virus. To protect others: If you have symptoms of COVID-19, take steps to prevent the virus from spreading to others. Stay home. Leave your house only to get medical care. Do not use public transit, if possible. Do not travel while you are sick. Wash your hands often with soap and water for at least 20 seconds. If soap and water are not available, use alcohol-based hand sanitizer. Make sure that all people in your household wash their hands well and often. Cough or sneeze into a tissue or your sleeve or elbow. Do not cough or sneeze into your  hand or into the air. Where to find more information Centers for Disease Control and Prevention: CharmCourses.be World Health Organization: https://www.castaneda.info/ Get help right away if: You have trouble breathing. You have pain or pressure in your chest. You are confused. You have bluish lips and fingernails. You have trouble waking from sleep. You have symptoms that get worse. These symptoms may be an emergency. Get help right away. Call 911. Do not wait to see if the symptoms will go away. Do not drive yourself to the hospital. Summary COVID-19 is an infection that is caused by a new coronavirus. Sometimes, there are no symptoms. Other times, symptoms range from mild to severe. Some people with a severe COVID-19 infection develop severe disease. The virus that causes COVID-19 can spread from person to person through droplets or aerosols from breathing, speaking, singing, coughing, or sneezing. Mild symptoms of COVID-19 can be managed at home with rest, fluids, and over-the-counter medicines. This information is not intended to replace advice given to you by your health care provider. Make sure you discuss any questions you have with your health care provider. Document Revised: 01/04/2021 Document Reviewed: 01/06/2021 Elsevier Patient Education  [place].    If you have been instructed to have an in-person evaluation today at a local Urgent Care facility, please use the link below. It will take you to a list of all of our available [facility], including address, phone number and hours of operation. Please do not delay care.  [facility]  If you or a family member do not have a primary care provider, use the link below to schedule a visit and establish care. When you choose a [facility] primary care physician or advanced practice provider, you gain a long-term partner in health. Find a Primary Care Provider  Learn more about  [facility] in-office and virtual care options: [facility]

## 2021-12-13 ENCOUNTER — Other Ambulatory Visit (HOSPITAL_COMMUNITY): Payer: Self-pay

## 2021-12-13 ENCOUNTER — Telehealth: Payer: Self-pay | Admitting: Pharmacy Technician

## 2021-12-13 NOTE — Telephone Encounter (Signed)
Patient Advocate Encounter  Received notification from [facility] that prior authorization for PANTOPRAZOLE 40MG  is required.   PA submitted on 11.14.23 Key [id number] Status is pending    11.16.23, CPhT Patient Advocate Phone: [phone]

## 2021-12-14 NOTE — Telephone Encounter (Signed)
Patient Advocate Encounter  Prior Authorization for PANTOPRAZOLE 40MG  has been approved.    PA# --- Effective dates: 11.14.23 through 11.12.24  Mekaylah Klich B. CPhT P: [phone] F: [phone]

## 2021-12-15 ENCOUNTER — Ambulatory Visit: Payer: BC Managed Care – PPO | Admitting: Family Medicine

## 2021-12-16 ENCOUNTER — Telehealth: Payer: BC Managed Care – PPO | Admitting: Physician Assistant

## 2021-12-16 DIAGNOSIS — B9689 Other specified bacterial agents as the cause of diseases classified elsewhere: Secondary | ICD-10-CM

## 2021-12-16 DIAGNOSIS — J019 Acute sinusitis, unspecified: Secondary | ICD-10-CM

## 2021-12-16 MED ORDER — AMOXICILLIN-POT CLAVULANATE 875-125 MG PO TABS: 1.0000 | ORAL_TABLET | Freq: Two times a day (BID) | ORAL | 0 refills | Status: DC

## 2021-12-16 NOTE — Progress Notes (Signed)
Virtual Visit Consent   Anthony Russell, you are scheduled for a virtual visit with a [facility] provider today. Just as with appointments in the office, your consent must be obtained to participate. Your consent will be active for this visit and any virtual visit you may have with one of our providers in the next 365 days. If you have a MyChart account, a copy of this consent can be sent to you electronically.  As this is a virtual visit, video technology does not allow for your provider to perform a traditional examination. This may limit your provider's ability to fully assess your condition. If your provider identifies any concerns that need to be evaluated in person or the need to arrange testing (such as labs, EKG, etc.), we will make arrangements to do so. Although advances in technology are sophisticated, we cannot ensure that it will always work on either your end or our end. If the connection with a video visit is poor, the visit may have to be switched to a telephone visit. With either a video or telephone visit, we are not always able to ensure that we have a secure connection.  By engaging in this virtual visit, you consent to the provision of healthcare and authorize for your insurance to be billed (if applicable) for the services provided during this visit. Depending on your insurance coverage, you may receive a charge related to this service.  I need to obtain your verbal consent now. Are you willing to proceed with your visit today? Anthony Russell has provided verbal consent on 12/16/2021 for a virtual visit (video or telephone). Mar Daring, PA-C  Date: 12/16/2021 8:51 AM  Virtual Visit via Video Note   I, Mar Daring, connected with  Anthony Russell  ([id number], 08/25/1980) on 12/16/21 at  8:45 AM EST by a video-enabled telemedicine application and verified that I am speaking with the correct person using two identifiers.  Location: Patient: Virtual Visit Location  Patient: Home Provider: Virtual Visit Location Provider: Home Office   I discussed the limitations of evaluation and management by telemedicine and the availability of in person appointments. The patient expressed understanding and agreed to proceed.    History of Present Illness: Anthony Russell is a 41 y.o. who identifies as a male who was assigned male at birth, and is being seen today for continued sinus congestion and chest congestion following Covid 19.  HPI: URI  This is a new problem. The current episode started 1 to 4 weeks ago. The problem has been gradually worsening. There has been no fever. Associated symptoms include chest pain (tightness with activity), congestion, coughing (with talking), headaches, a plugged ear sensation, rhinorrhea and sinus pain. Pertinent negatives include no diarrhea, ear pain, nausea, sore throat, vomiting or wheezing. Associated symptoms comments: Fatigue after exertion. Treatments tried: tessalon perles, azelastine, promethazine DM cough syrup. The treatment provided no relief.     Problems:  Patient Active Problem List   Diagnosis Date Noted   Capsulitis of right shoulder 11/04/2021   Encounter for screening for other metabolic disorders [id number]   Dyspnea on exertion 04/07/2021   Sleep apnea 02/23/2021   Migraines 02/23/2021   Lumbar back pain 02/23/2021   HTN (hypertension) 02/23/2021   Dyspepsia 02/23/2021   DM (diabetes mellitus) ([place]) 02/23/2021   Nasal septal deviation 02/17/2021   Laryngitis 01/19/2021   Asymmetric tonsils 12/01/2020   Subacromial bursitis of right shoulder joint 05/17/2020   Hypersomnia 11/03/2015   Obesity  11/03/2015   Acute bacterial bronchitis 01/18/2015   Allergic rhinitis 04/15/2014   Asthma in adult 04/15/2014   GERD (gastroesophageal reflux disease) 04/15/2014   IBS (irritable bowel syndrome) 04/01/2013   Nausea alone 01/15/2013   Diarrhea 01/15/2013   Abdominal cramping 01/15/2013   Depression  10/15/2012   Lesion of eyebrow 02/09/2012   Reactive airways dysfunction syndrome ([place]) 07/05/2011   Gastroparesis 07/05/2011   Anxiety 07/05/2010   TESTOSTERONE DEFICIENCY 05/26/2008   FATIGUE 05/25/2008   ABDOMINAL BLOATING 04/03/2008   Hyperlipidemia 02/06/2008   OVERWEIGHT 02/06/2008   Facet arthropathy, lumbar 02/06/2008   Atypical chest pain 02/06/2008   GERD 11/19/2007   ALLERGY 11/18/2007    Allergies: No Known Allergies Medications:  Current Outpatient Medications:    amoxicillin-clavulanate (AUGMENTIN) 875-125 MG tablet, Take 1 tablet by mouth 2 (two) times daily., Disp: 20 tablet, Rfl: 0   albuterol (VENTOLIN HFA) 108 (90 Base) MCG/ACT inhaler, INHALE 2 PUFFS INTO THE LUNGS EVERY 6 HOURS AS NEEDED FOR WHEEZING OR SHORTNESS OF BREATH (Patient taking differently: Inhale 2 puffs into the lungs every 6 (six) hours as needed for wheezing or shortness of breath.), Disp: 20.1 g, Rfl: 0   atorvastatin (LIPITOR) 10 MG tablet, TAKE 1 TABLET(10 MG) BY MOUTH DAILY (Patient taking differently: Take 10 mg by mouth daily.), Disp: 90 tablet, Rfl: 3   azelastine (ASTELIN) 0.1 % nasal spray, Place 1 spray into both nostrils 2 (two) times daily. Use in each nostril as directed, Disp: 30 mL, Rfl: 0   baclofen (LIORESAL) 10 MG tablet, Take 1 tablet (10 mg total) by mouth 2 (two) times daily., Disp: 30 each, Rfl: 3   benzonatate (TESSALON) 100 MG capsule, Take 1 capsule (100 mg total) by mouth 3 (three) times daily as needed., Disp: 30 capsule, Rfl: 0   calcium carbonate (OS-CAL) 1250 (500 Ca) MG chewable tablet, Chew 2 tablets by mouth daily., Disp: , Rfl:    Continuous Blood Gluc Sensor (FREESTYLE LIBRE 3 SENSOR) MISC, USE TO CHECK BLOOD GLUCOSE CONTINUOUSLY, CHANGE SENSOR EVERY 14 DAYS, Disp: 2 each, Rfl: 1   diclofenac Sodium (PENNSAID) 2 % SOLN, Place 2 Pump (40 mg total) onto the skin 2 (two) times daily., Disp: 112 g, Rfl: 2   dicyclomine (BENTYL) 10 MG capsule, Take 1 capsule (10 mg total) by  mouth 3 (three) times daily as needed for spasms (abdominal pain)., Disp: 30 capsule, Rfl: 0   famotidine (PEPCID) 20 MG tablet, Take 1 tablet (20 mg total) by mouth at bedtime., Disp: 30 tablet, Rfl: 3   fluticasone-salmeterol (ADVAIR HFA) 230-21 MCG/ACT inhaler, Inhale 2 puffs into the lungs 2 (two) times daily., Disp: 1 each, Rfl: 3   glycopyrrolate (ROBINUL) 2 MG tablet, TAKE 1 TABLET BY MOUTH TWICE DAILY AS NEEDED FOR ABDOMINAL DISCOMFORT (Patient taking differently: Take 1 mg by mouth 2 (two) times daily as needed (abdominal discomfort).), Disp: 60 tablet, Rfl: 2   ibuprofen (ADVIL) 600 MG tablet, Take 1 tablet (600 mg total) by mouth every 8 (eight) hours as needed., Disp: 30 tablet, Rfl: 0   L-Arginine 500 MG CAPS, Take 2 capsules by mouth 2 (two) times daily., Disp: , Rfl:    levocetirizine (XYZAL) 5 MG tablet, Take 1 tablet (5 mg total) by mouth every evening., Disp: 30 tablet, Rfl: 3   metFORMIN (GLUCOPHAGE) 1000 MG tablet, Take 1 tablet (1,000 mg total) by mouth 2 (two) times daily with a meal. TAKE 1 TABLET(1000 MG) BY MOUTH TWICE DAILY WITH A MEAL, Disp:  30 tablet, Rfl: 2   Multiple Vitamin (MULTIVITAMIN ADULT PO), Take 2 tablets by mouth daily., Disp: , Rfl:    Nutritional Supplements (PYCNOGENOL) 300-30 MG CAPS, Take 1 capsule by mouth 3 (three) times daily., Disp: , Rfl:    pantoprazole (PROTONIX) 40 MG tablet, Take 1 tablet (40 mg total) by mouth 2 (two) times daily before a meal., Disp: 60 tablet, Rfl: 6   promethazine-dextromethorphan (PROMETHAZINE-DM) 6.25-15 MG/5ML syrup, Take 5 mLs by mouth 4 (four) times daily as needed., Disp: 118 mL, Rfl: 0   propranolol (INDERAL) 20 MG tablet, Take 1 tablet (20 mg total) by mouth 2 (two) times daily., Disp: 60 tablet, Rfl: 3  Observations/Objective: Patient is well-developed, well-nourished in no acute distress.  Resting comfortably at home.  Head is normocephalic, atraumatic.  No labored breathing.  Speech is clear and coherent with  logical content.  Patient is alert and oriented at baseline.    Assessment and Plan: 1. Acute bacterial sinusitis - amoxicillin-clavulanate (AUGMENTIN) 875-125 MG tablet; Take 1 tablet by mouth 2 (two) times daily.  Dispense: 20 tablet; Refill: 0  - Worsening symptoms; suspect secondary bacterial sinus infection following Covid 19 - Will give Augmentin - Continue allergy medications.  - Steam and humidifier can help - Stay well hydrated and get plenty of rest.  - Seek in person evaluation if no symptom improvement or if symptoms worsen   Follow Up Instructions: I discussed the assessment and treatment plan with the patient. The patient was provided an opportunity to ask questions and all were answered. The patient agreed with the plan and demonstrated an understanding of the instructions.  A copy of instructions were sent to the patient via MyChart unless otherwise noted below.    The patient was advised to call back or seek an in-person evaluation if the symptoms worsen or if the condition fails to improve as anticipated.  Time:  I spent 11 minutes with the patient via telehealth technology discussing the above problems/concerns.    Margaretann Loveless, PA-C

## 2021-12-16 NOTE — Patient Instructions (Signed)
Fletcher AnonSteven L Bullis, thank you for joining Margaretann LovelessJennifer M Serenity Batley, PA-C for today's virtual visit.  While this provider is not your primary care provider (PCP), if your PCP is located in our provider database this encounter information will be shared with them immediately following your visit.   A [facility] MyChart account gives you access to today's visit and all your visits, tests, and labs performed at [facility] " click here if you don't have a [facility] MyChart account or go to [email or web address]  Consent: (Patient) Fletcher AnonSteven L Skousen provided verbal consent for this virtual visit at the beginning of the encounter.  Current Medications:  Current Outpatient Medications:    amoxicillin-clavulanate (AUGMENTIN) 875-125 MG tablet, Take 1 tablet by mouth 2 (two) times daily., Disp: 20 tablet, Rfl: 0   albuterol (VENTOLIN HFA) 108 (90 Base) MCG/ACT inhaler, INHALE 2 PUFFS INTO THE LUNGS EVERY 6 HOURS AS NEEDED FOR WHEEZING OR SHORTNESS OF BREATH (Patient taking differently: Inhale 2 puffs into the lungs every 6 (six) hours as needed for wheezing or shortness of breath.), Disp: 20.1 g, Rfl: 0   atorvastatin (LIPITOR) 10 MG tablet, TAKE 1 TABLET(10 MG) BY MOUTH DAILY (Patient taking differently: Take 10 mg by mouth daily.), Disp: 90 tablet, Rfl: 3   azelastine (ASTELIN) 0.1 % nasal spray, Place 1 spray into both nostrils 2 (two) times daily. Use in each nostril as directed, Disp: 30 mL, Rfl: 0   baclofen (LIORESAL) 10 MG tablet, Take 1 tablet (10 mg total) by mouth 2 (two) times daily., Disp: 30 each, Rfl: 3   benzonatate (TESSALON) 100 MG capsule, Take 1 capsule (100 mg total) by mouth 3 (three) times daily as needed., Disp: 30 capsule, Rfl: 0   calcium carbonate (OS-CAL) 1250 (500 Ca) MG chewable tablet, Chew 2 tablets by mouth daily., Disp: , Rfl:    Continuous Blood Gluc Sensor (FREESTYLE LIBRE 3 SENSOR) MISC, USE TO CHECK BLOOD GLUCOSE CONTINUOUSLY, CHANGE SENSOR EVERY 14 DAYS, Disp:  2 each, Rfl: 1   diclofenac Sodium (PENNSAID) 2 % SOLN, Place 2 Pump (40 mg total) onto the skin 2 (two) times daily., Disp: 112 g, Rfl: 2   dicyclomine (BENTYL) 10 MG capsule, Take 1 capsule (10 mg total) by mouth 3 (three) times daily as needed for spasms (abdominal pain)., Disp: 30 capsule, Rfl: 0   famotidine (PEPCID) 20 MG tablet, Take 1 tablet (20 mg total) by mouth at bedtime., Disp: 30 tablet, Rfl: 3   fluticasone-salmeterol (ADVAIR HFA) 230-21 MCG/ACT inhaler, Inhale 2 puffs into the lungs 2 (two) times daily., Disp: 1 each, Rfl: 3   glycopyrrolate (ROBINUL) 2 MG tablet, TAKE 1 TABLET BY MOUTH TWICE DAILY AS NEEDED FOR ABDOMINAL DISCOMFORT (Patient taking differently: Take 1 mg by mouth 2 (two) times daily as needed (abdominal discomfort).), Disp: 60 tablet, Rfl: 2   ibuprofen (ADVIL) 600 MG tablet, Take 1 tablet (600 mg total) by mouth every 8 (eight) hours as needed., Disp: 30 tablet, Rfl: 0   L-Arginine 500 MG CAPS, Take 2 capsules by mouth 2 (two) times daily., Disp: , Rfl:    levocetirizine (XYZAL) 5 MG tablet, Take 1 tablet (5 mg total) by mouth every evening., Disp: 30 tablet, Rfl: 3   metFORMIN (GLUCOPHAGE) 1000 MG tablet, Take 1 tablet (1,000 mg total) by mouth 2 (two) times daily with a meal. TAKE 1 TABLET(1000 MG) BY MOUTH TWICE DAILY WITH A MEAL, Disp: 30 tablet, Rfl: 2   Multiple Vitamin (MULTIVITAMIN ADULT PO), Take 2 tablets  by mouth daily., Disp: , Rfl:    Nutritional Supplements (PYCNOGENOL) 300-30 MG CAPS, Take 1 capsule by mouth 3 (three) times daily., Disp: , Rfl:    pantoprazole (PROTONIX) 40 MG tablet, Take 1 tablet (40 mg total) by mouth 2 (two) times daily before a meal., Disp: 60 tablet, Rfl: 6   promethazine-dextromethorphan (PROMETHAZINE-DM) 6.25-15 MG/5ML syrup, Take 5 mLs by mouth 4 (four) times daily as needed., Disp: 118 mL, Rfl: 0   propranolol (INDERAL) 20 MG tablet, Take 1 tablet (20 mg total) by mouth 2 (two) times daily., Disp: 60 tablet, Rfl: 3    Medications ordered in this encounter:  Meds ordered this encounter  Medications   amoxicillin-clavulanate (AUGMENTIN) 875-125 MG tablet    Sig: Take 1 tablet by mouth 2 (two) times daily.    Dispense:  20 tablet    Refill:  0    Order Specific Question:   Supervising Provider    Answer:   Merrilee Jansky [id number]     *If you need refills on other medications prior to your next appointment, please contact your pharmacy*  Follow-Up: Call back or seek an in-person evaluation if the symptoms worsen or if the condition fails to improve as anticipated.  [facility] [phone]  Other Instructions  Sinus Infection, Adult A sinus infection, also called sinusitis, is inflammation of your sinuses. Sinuses are hollow spaces in the bones around your face. Your sinuses are located: Around your eyes. In the middle of your forehead. Behind your nose. In your cheekbones. Mucus normally drains out of your sinuses. When your nasal tissues become inflamed or swollen, mucus can become trapped or blocked. This allows bacteria, viruses, and fungi to grow, which leads to infection. Most infections of the sinuses are caused by a virus. A sinus infection can develop quickly. It can last for up to 4 weeks (acute) or for more than 12 weeks (chronic). A sinus infection often develops after a cold. What are the causes? This condition is caused by anything that creates swelling in the sinuses or stops mucus from draining. This includes: Allergies. Asthma. Infection from bacteria or viruses. Deformities or blockages in your nose or sinuses. Abnormal growths in the nose (nasal polyps). Pollutants, such as chemicals or irritants in the air. Infection from fungi. This is rare. What increases the risk? You are more likely to develop this condition if you: Have a weak body defense system (immune system). Do a lot of swimming or diving. Overuse nasal sprays. Smoke. What are the signs or  symptoms? The main symptoms of this condition are pain and a feeling of pressure around the affected sinuses. Other symptoms include: Stuffy nose or congestion that makes it difficult to breathe through your nose. Thick yellow or greenish drainage from your nose. Tenderness, swelling, and warmth over the affected sinuses. A cough that may get worse at night. Decreased sense of smell and taste. Extra mucus that collects in the throat or the back of the nose (postnasal drip) causing a sore throat or bad breath. Tiredness (fatigue). Fever. How is this diagnosed? This condition is diagnosed based on: Your symptoms. Your medical history. A physical exam. Tests to find out if your condition is acute or chronic. This may include: Checking your nose for nasal polyps. Viewing your sinuses using a device that has a light (endoscope). Testing for allergies or bacteria. Imaging tests, such as an MRI or CT scan. In rare cases, a bone biopsy may be done to  rule out more serious types of fungal sinus disease. How is this treated? Treatment for a sinus infection depends on the cause and whether your condition is chronic or acute. If caused by a virus, your symptoms should go away on their own within 10 days. You may be given medicines to relieve symptoms. They include: Medicines that shrink swollen nasal passages (decongestants). A spray that eases inflammation of the nostrils (topical intranasal corticosteroids). Rinses that help get rid of thick mucus in your nose (nasal saline washes). Medicines that treat allergies (antihistamines). Over-the-counter pain relievers. If caused by bacteria, your health care provider may recommend waiting to see if your symptoms improve. Most bacterial infections will get better without antibiotic medicine. You may be given antibiotics if you have: A severe infection. A weak immune system. If caused by narrow nasal passages or nasal polyps, surgery may be  needed. Follow these instructions at home: Medicines Take, use, or apply over-the-counter and prescription medicines only as told by your health care provider. These may include nasal sprays. If you were prescribed an antibiotic medicine, take it as told by your health care provider. Do not stop taking the antibiotic even if you start to feel better. Hydrate and humidify  Drink enough fluid to keep your urine pale yellow. Staying hydrated will help to thin your mucus. Use a cool mist humidifier to keep the humidity level in your home above 50%. Inhale steam for 10-15 minutes, 3-4 times a day, or as told by your health care provider. You can do this in the bathroom while a hot shower is running. Limit your exposure to cool or dry air. Rest Rest as much as possible. Sleep with your head raised (elevated). Make sure you get enough sleep each night. General instructions  Apply a warm, moist washcloth to your face 3-4 times a day or as told by your health care provider. This will help with discomfort. Use nasal saline washes as often as told by your health care provider. Wash your hands often with soap and water to reduce your exposure to germs. If soap and water are not available, use hand sanitizer. Do not smoke. Avoid being around people who are smoking (secondhand smoke). Keep all follow-up visits. This is important. Contact a health care provider if: You have a fever. Your symptoms get worse. Your symptoms do not improve within 10 days. Get help right away if: You have a severe headache. You have persistent vomiting. You have severe pain or swelling around your face or eyes. You have vision problems. You develop confusion. Your neck is stiff. You have trouble breathing. These symptoms may be an emergency. Get help right away. Call 911. Do not wait to see if the symptoms will go away. Do not drive yourself to the hospital. Summary A sinus infection is soreness and inflammation of  your sinuses. Sinuses are hollow spaces in the bones around your face. This condition is caused by nasal tissues that become inflamed or swollen. The swelling traps or blocks the flow of mucus. This allows bacteria, viruses, and fungi to grow, which leads to infection. If you were prescribed an antibiotic medicine, take it as told by your health care provider. Do not stop taking the antibiotic even if you start to feel better. Keep all follow-up visits. This is important. This information is not intended to replace advice given to you by your health care provider. Make sure you discuss any questions you have with your health care provider. Document Revised: 12/21/2020  Document Reviewed: 12/21/2020 Elsevier Patient Education  2023 Elsevier Inc.    If you have been instructed to have an in-person evaluation today at a local Urgent Care facility, please use the link below. It will take you to a list of all of our available [facility], including address, phone number and hours of operation. Please do not delay care.  [facility]  If you or a family member do not have a primary care provider, use the link below to schedule a visit and establish care. When you choose a [facility] primary care physician or advanced practice provider, you gain a long-term partner in health. Find a Primary Care Provider  Learn more about [facility] in-office and virtual care options: [facility] - Get Care Now

## 2021-12-20 ENCOUNTER — Ambulatory Visit (INDEPENDENT_AMBULATORY_CARE_PROVIDER_SITE_OTHER): Payer: BC Managed Care – PPO | Admitting: Family Medicine

## 2021-12-20 ENCOUNTER — Encounter: Payer: Self-pay | Admitting: Family Medicine

## 2021-12-20 VITALS — BP 120/74 | Ht 70.0 in | Wt 220.0 lb

## 2021-12-20 DIAGNOSIS — M7661 Achilles tendinitis, right leg: Secondary | ICD-10-CM

## 2021-12-20 DIAGNOSIS — M25861 Other specified joint disorders, right knee: Secondary | ICD-10-CM

## 2021-12-20 MED ORDER — PREDNISONE 5 MG PO TABS: ORAL_TABLET | ORAL | 0 refills | Status: DC

## 2021-12-20 NOTE — Patient Instructions (Signed)
Good to see you ?Please use ice as needed  ?Please try the exercises   ?Please send me a message in MyChart with any questions or updates.  ?Please see me back in 4-6 weeks.  ? ?--Dr. Dailen Mcclish ? ?

## 2021-12-20 NOTE — Progress Notes (Signed)
  Anthony Russell - 41 y.o. male MRN [id number]  Date of birth: 24-Dec-1980  SUBJECTIVE:  Including CC & ROS.  No chief complaint on file.   Anthony Russell is a 41 y.o. male that is presenting with acute right posterior leg pain and Achilles pain.  Leg pain has been ongoing for few weeks.  Achilles pain has been ongoing for about a week.  The pain in the knee is intermittent in nature.  The Achilles pain is worse with walking.   Review of Systems See HPI   HISTORY: Past Medical, Surgical, Social, and Family History Reviewed & Updated per EMR.   Pertinent Historical Findings include:  Past Medical History:  Diagnosis Date   Allergy    Allergy, unspecified not elsewhere classified    rx w/ OTC antihistamines PRN   Anxiety    Asthma    Atypical chest pain    recent neg eval w/ normal cxr/ekg   Depression    Diverticulosis    DM (diabetes mellitus) (HCC)    Dyspepsia    on protonix 40mg /d   Esophagitis    GERD (gastroesophageal reflux disease)    on protonix 40mg /d   HTN (hypertension)    Hyperlipidemia    on diet alone   IBS (irritable bowel syndrome)    Lumbar back pain    s/p lumbar laminectomy 2007 by DrCabbell   Migraines    Overweight(278.02)    weight Jan10=246#. .he was 225# in 9/07...diet and exercise was discussed   Sleep apnea    not wearing c-pap currently    Past Surgical History:  Procedure Laterality Date   DENTAL SURGERY     LUMBAR LAMINECTOMY  01/30/2005   DrCabbell   TEAR DUCT PROBING       PHYSICAL EXAM:  VS: BP 120/74   Ht 5\' 10"  (1.778 m)   Wt 220 lb (99.8 kg)   BMI 31.57 kg/m  Physical Exam Gen: NAD, alert, cooperative with exam, well-appearing MSK:  Neurovascularly intact       ASSESSMENT & PLAN:   Impingement of knee joint, right Acutely occurring. No effusion appreciated. Appears more knee as opposed to being radicular in nature.  - counseled on home exercise therapy and supportive care - prednisone  - could consider injection,  PT, or further imaging.   Achilles tendinitis, right leg Acutely occurring. History of spur in the area. Haglund's appreciated  - counseled on home exercise therapy and supportive care - consider heel lift, shockwave therapy or PT

## 2021-12-20 NOTE — Assessment & Plan Note (Signed)
Acutely occurring. No effusion appreciated. Appears more knee as opposed to being radicular in nature.  - counseled on home exercise therapy and supportive care - prednisone  - could consider injection, PT, or further imaging.

## 2021-12-20 NOTE — Assessment & Plan Note (Signed)
Acutely occurring. History of spur in the area. Haglund's appreciated  - counseled on home exercise therapy and supportive care - consider heel lift, shockwave therapy or PT

## 2021-12-26 ENCOUNTER — Emergency Department (HOSPITAL_BASED_OUTPATIENT_CLINIC_OR_DEPARTMENT_OTHER)
Admission: EM | Admit: 2021-12-26 | Discharge: 2021-12-26 | Disposition: A | Discharge status: Home / Self Care | Payer: BC Managed Care – PPO | Attending: Emergency Medicine | Admitting: Emergency Medicine

## 2021-12-26 ENCOUNTER — Encounter (HOSPITAL_BASED_OUTPATIENT_CLINIC_OR_DEPARTMENT_OTHER): Payer: Self-pay | Admitting: Urology

## 2021-12-26 ENCOUNTER — Other Ambulatory Visit: Payer: Self-pay

## 2021-12-26 ENCOUNTER — Emergency Department (HOSPITAL_BASED_OUTPATIENT_CLINIC_OR_DEPARTMENT_OTHER): Payer: BC Managed Care – PPO

## 2021-12-26 ENCOUNTER — Ambulatory Visit (INDEPENDENT_AMBULATORY_CARE_PROVIDER_SITE_OTHER): Payer: BC Managed Care – PPO | Admitting: Medical

## 2021-12-26 VITALS — BP 133/78 | HR 120 | Temp 98.2°F | Resp 20 | Ht 70.0 in | Wt 224.0 lb

## 2021-12-26 DIAGNOSIS — R000 Tachycardia, unspecified: Secondary | ICD-10-CM

## 2021-12-26 DIAGNOSIS — E119 Type 2 diabetes mellitus without complications: Secondary | ICD-10-CM

## 2021-12-26 DIAGNOSIS — R002 Palpitations: Secondary | ICD-10-CM | POA: Diagnosis not present

## 2021-12-26 DIAGNOSIS — R0602 Shortness of breath: Secondary | ICD-10-CM | POA: Diagnosis not present

## 2021-12-26 DIAGNOSIS — Z8616 Personal history of COVID-19: Secondary | ICD-10-CM | POA: Diagnosis not present

## 2021-12-26 LAB — CBC WITH DIFFERENTIAL/PLATELET
Abs Immature Granulocytes: 0.04 10*3/uL (ref 0.00–0.07)
Basophils Absolute: 0.1 10*3/uL (ref 0.0–0.1)
Basophils Relative: 1 %
Eosinophils Absolute: 0.1 10*3/uL (ref 0.0–0.5)
Eosinophils Relative: 1 %
HCT: 45.2 % (ref 39.0–52.0)
Hemoglobin: 15.1 g/dL (ref 13.0–17.0)
Immature Granulocytes: 0 %
Lymphocytes Relative: 24 %
Lymphs Abs: 2.2 10*3/uL (ref 0.7–4.0)
MCH: 29.7 pg (ref 26.0–34.0)
MCHC: 33.4 g/dL (ref 30.0–36.0)
MCV: 88.8 fL (ref 80.0–100.0)
Monocytes Absolute: 0.7 10*3/uL (ref 0.1–1.0)
Monocytes Relative: 8 %
Neutro Abs: 6 10*3/uL (ref 1.7–7.7)
Neutrophils Relative %: 66 %
Platelets: 224 10*3/uL (ref 150–400)
RBC: 5.09 MIL/uL (ref 4.22–5.81)
RDW: 13.7 % (ref 11.5–15.5)
WBC: 9.1 10*3/uL (ref 4.0–10.5)
nRBC: 0 % (ref 0.0–0.2)

## 2021-12-26 LAB — COMPREHENSIVE METABOLIC PANEL
ALT: 29 U/L (ref 0–44)
AST: 21 U/L (ref 15–41)
Albumin: 4.2 g/dL (ref 3.5–5.0)
Alkaline Phosphatase: 49 U/L (ref 38–126)
Anion gap: 11 (ref 5–15)
BUN: 10 mg/dL (ref 6–20)
CO2: 27 mmol/L (ref 22–32)
Calcium: 9.2 mg/dL (ref 8.9–10.3)
Chloride: 103 mmol/L (ref 98–111)
Creatinine, Ser: 0.97 mg/dL (ref 0.61–1.24)
GFR, Estimated: >60 mL/min (ref >60)
Glucose, Bld: 148 mg/dL — ABNORMAL HIGH (ref 70–99)
Potassium: 3.8 mmol/L (ref 3.5–5.1)
Sodium: 141 mmol/L (ref 135–145)
Total Bilirubin: 0.4 mg/dL (ref 0.3–1.2)
Total Protein: 7.3 g/dL (ref 6.5–8.1)

## 2021-12-26 LAB — TROPONIN I (HIGH SENSITIVITY): Troponin I (High Sensitivity): <2 ng/L (ref <18)

## 2021-12-26 LAB — D-DIMER, QUANTITATIVE: D-Dimer, Quant: 0.33 ug/mL-FEU (ref 0.00–0.50)

## 2021-12-26 NOTE — Progress Notes (Signed)
   Subjective:    Patient ID: Anthony Russell, male    DOB: [birth year], 41 y.o.   MRN: [id number]  HPI Pt for some post covid complaints.   He was diagnosed with covid on 12-09-2021. Pt had prior vaccines in the past. He did not have covid vaccine. He state 3-4 vaccines in the past.  Pt states he gets tired easily when he walks some. He is not sure what distance will make him feel fatigued.  Pt states his symptoms were more like a sinus infection. States felt like nsasl congestion, rare dry cough and sinus pressure. He never took meds.   Pt is diabetic. Last A1c was 5.9.   He noticed 2 days ago on line just sitting notice pulse was up to 134.     Review of Systems  Constitutional:  Negative for chills and fever.  HENT:  Negative for congestion and ear discharge.   Respiratory:  Negative for cough, chest tightness and shortness of breath.   Cardiovascular:  Negative for chest pain and palpitations.       Objective:   Physical Exam   General Mental Status- Alert. General Appearance- Not in acute distress.   Skin General: Color- Normal Color. Moisture- Normal Moisture.  Neck Carotid Arteries- Normal color. Moisture- Normal Moisture. No carotid bruits. No JVD.  Chest and Lung Exam Auscultation: Breath Sounds:-Normal.  Cardiovascular Auscultation:Rythm- Regular. Murmurs & Other Heart Sounds:Auscultation of the heart reveals- No Murmurs.  Abdomen Inspection:-Inspeection Normal. Palpation/Percussion:Note:No mass. Palpation and Percussion of the abdomen reveal- Non Tender, Non Distended + BS, no rebound or guarding.    Neurologic Cranial Nerve exam:- CN III-XII intact(No nystagmus), symmetric smile. Strength:- 5/5 equal and symmetric strength both upper and lower extremities.   Lower ext- no calf swelling. Negative homans sign.     Assessment & Plan:   Patient Instructions  Post covid with sinus tachycardia at rest in 130's at home. Lowest pulse recently laying  supine at night before sleep at 100.  On walking down hall toady pulse got to 139. Brief drop in 02 sat to 93%. Mild dyspnea at end of walk with faint chest pressure.  EKG today showed rate of 116.   Considering post covid autonomic dis regulation. Maybe side effect of mucinex D.  Recommend emergency department evaluation. I think would benefit from cxr to see if covid pneumonia lingering post infection, labs maybe d dimer and maybe CT of chest if indicated per ED MD.  I have low suspicion but am aware of recent covid.   Discussed with pt plan and called downstairs to ED.  Follow up date to be determined after ED evaluation.      Esperanza Richters, PA-C

## 2021-12-26 NOTE — ED Triage Notes (Signed)
Pt states having high heart rate for past couple days (134)  Pt states has been high since covid x 2 weeks ago  Denies any pain but reports chest pressure

## 2021-12-26 NOTE — ED Provider Notes (Signed)
[facility] EMERGENCY DEPARTMENT Provider Note   CSN: [id number] Arrival date & time: 12/26/21  1450     History  Chief Complaint  Patient presents with   Tachycardia    Anthony Russell is a 41 y.o. male.  Patient here with fast heart rate.  Had COVID a couple weeks ago.  Denies any chest pain or shortness of breath.  Nothing makes it worse or better.  Sent here for further workup by primary care doctor.  Denies any anxiety or stress.  No abdominal pain or nausea or vomiting.  The history is provided by the patient.       Home Medications Prior to Admission medications   Medication Sig Start Date End Date Taking? Authorizing Provider  albuterol (VENTOLIN HFA) 108 (90 Base) MCG/ACT inhaler INHALE 2 PUFFS INTO THE LUNGS EVERY 6 HOURS AS NEEDED FOR WHEEZING OR SHORTNESS OF BREATH Patient taking differently: Inhale 2 puffs into the lungs every 6 (six) hours as needed for wheezing or shortness of breath. 02/09/21   Saguier, Ramon Dredge, PA-C  amoxicillin-clavulanate (AUGMENTIN) 875-125 MG tablet Take 1 tablet by mouth 2 (two) times daily. 12/16/21   Margaretann Loveless, PA-C  atorvastatin (LIPITOR) 10 MG tablet TAKE 1 TABLET(10 MG) BY MOUTH DAILY Patient taking differently: Take 10 mg by mouth daily. 02/14/21   Saguier, Ramon Dredge, PA-C  azelastine (ASTELIN) 0.1 % nasal spray Place 1 spray into both nostrils 2 (two) times daily. Use in each nostril as directed 12/09/21   Margaretann Loveless, PA-C  baclofen (LIORESAL) 10 MG tablet Take 1 tablet (10 mg total) by mouth 2 (two) times daily. 08/03/21   Imogene Burn, MD  benzonatate (TESSALON) 100 MG capsule Take 1 capsule (100 mg total) by mouth 3 (three) times daily as needed. 12/09/21   Margaretann Loveless, PA-C  calcium carbonate (OS-CAL) 1250 (500 Ca) MG chewable tablet Chew 2 tablets by mouth daily.    [provider]  Continuous Blood Gluc Sensor (FREESTYLE LIBRE 3 SENSOR) MISC USE TO CHECK BLOOD GLUCOSE CONTINUOUSLY,  CHANGE SENSOR EVERY 14 DAYS 12/01/21   Saguier, Ramon Dredge, PA-C  diclofenac Sodium (PENNSAID) 2 % SOLN Place 2 Pump (40 mg total) onto the skin 2 (two) times daily. 11/04/21   Myra Rude, MD  dicyclomine (BENTYL) 10 MG capsule Take 1 capsule (10 mg total) by mouth 3 (three) times daily as needed for spasms (abdominal pain). 04/08/21   Arnaldo Natal, NP  famotidine (PEPCID) 20 MG tablet Take 1 tablet (20 mg total) by mouth at bedtime. 12/16/20   Meredith Pel, NP  fluticasone-salmeterol (ADVAIR HFA) [phone] MCG/ACT inhaler Inhale 2 puffs into the lungs 2 (two) times daily. 03/11/21   Hunsucker, Lesia Sago, MD  glycopyrrolate (ROBINUL) 2 MG tablet TAKE 1 TABLET BY MOUTH TWICE DAILY AS NEEDED FOR ABDOMINAL DISCOMFORT Patient taking differently: Take 1 mg by mouth 2 (two) times daily as needed (abdominal discomfort). 04/05/21   Meredith Pel, NP  ibuprofen (ADVIL) 600 MG tablet Take 1 tablet (600 mg total) by mouth every 8 (eight) hours as needed. 12/09/21   Margaretann Loveless, PA-C  L-Arginine 500 MG CAPS Take 2 capsules by mouth 2 (two) times daily. 08/30/20   [provider]  levocetirizine (XYZAL) 5 MG tablet Take 1 tablet (5 mg total) by mouth every evening. 11/28/18   Saguier, Ramon Dredge, PA-C  metFORMIN (GLUCOPHAGE) 1000 MG tablet Take 1 tablet (1,000 mg total) by mouth 2 (two) times daily with a meal.  TAKE 1 TABLET(1000 MG) BY MOUTH TWICE DAILY WITH A MEAL 10/25/21 11/24/21  Saguier, Ramon Dredge, PA-C  Multiple Vitamin (MULTIVITAMIN ADULT PO) Take 2 tablets by mouth daily.    [provider]  Nutritional Supplements (PYCNOGENOL) 300-30 MG CAPS Take 1 capsule by mouth 3 (three) times daily. 08/30/20   [provider]  pantoprazole (PROTONIX) 40 MG tablet Take 1 tablet (40 mg total) by mouth 2 (two) times daily before a meal. 12/16/20   Meredith Pel, NP  predniSONE (DELTASONE) 5 MG tablet Take 6 pills for first day, 5 pills second day, 4 pills third day, 3 pills  fourth day, 2 pills the fifth day, and 1 pill sixth day. 12/20/21   Myra Rude, MD  promethazine-dextromethorphan (PROMETHAZINE-DM) 6.25-15 MG/5ML syrup Take 5 mLs by mouth 4 (four) times daily as needed. 12/09/21   Margaretann Loveless, PA-C  propranolol (INDERAL) 20 MG tablet Take 1 tablet (20 mg total) by mouth 2 (two) times daily. 10/25/20   Ocie Doyne, MD      Allergies    Patient has no known allergies.    Review of Systems   Review of Systems  Physical Exam Updated Vital Signs BP 128/89   Pulse (!) 109   Temp 99.3 F (37.4 C)   Resp 14   Ht 5\' 10"  (1.778 m)   Wt 101.6 kg   SpO2 95%   BMI 32.14 kg/m  Physical Exam Vitals and nursing note reviewed.  Constitutional:      General: He is not in acute distress.    Appearance: He is well-developed. He is not ill-appearing.  HENT:     Head: Normocephalic and atraumatic.     Nose: Nose normal.     Mouth/Throat:     Mouth: Mucous membranes are moist.  Eyes:     Extraocular Movements: Extraocular movements intact.     Conjunctiva/sclera: Conjunctivae normal.     Pupils: Pupils are equal, round, and reactive to light.  Cardiovascular:     Rate and Rhythm: Normal rate and regular rhythm.     Pulses: Normal pulses.     Heart sounds: Normal heart sounds. No murmur heard. Pulmonary:     Effort: Pulmonary effort is normal. No respiratory distress.     Breath sounds: Normal breath sounds.  Abdominal:     Palpations: Abdomen is soft.     Tenderness: There is no abdominal tenderness.  Musculoskeletal:        General: No swelling.     Cervical back: Neck supple.  Skin:    General: Skin is warm and dry.     Capillary Refill: Capillary refill takes less than 2 seconds.  Neurological:     General: No focal deficit present.     Mental Status: He is alert and oriented to person, place, and time.     Cranial Nerves: No cranial nerve deficit.     Sensory: No sensory deficit.     Motor: No weakness.     Coordination:  Coordination normal.  Psychiatric:        Mood and Affect: Mood normal.     ED Results / Procedures / Treatments   Labs (all labs ordered are listed, but only abnormal results are displayed) Labs Reviewed  COMPREHENSIVE METABOLIC PANEL - Abnormal; Notable for the following components:      Result Value   Glucose, Bld 148 (*)    All other components within normal limits  CBC WITH DIFFERENTIAL/PLATELET  D-DIMER, QUANTITATIVE  TROPONIN I (HIGH SENSITIVITY)    EKG EKG Interpretation  Date/Time:  Monday December 26 2021 15:19:00 EST Ventricular Rate:  121 PR Interval:  158 QRS Duration: 98 QT Interval:  304 QTC Calculation: 431 R Axis:   98 Text Interpretation: Sinus tachycardia Rightward axis Confirmed by Virgina Norfolk ([id number]) on 12/26/2021 3:23:08 PM  Radiology DG Chest Portable 1 View  Result Date: 12/26/2021 CLINICAL DATA:  Shortness of breath EXAM: PORTABLE CHEST 1 VIEW COMPARISON:  02/11/21 CXR FINDINGS: No pleural effusion. No pneumothorax. Unchanged cardiac and mediastinal contours. No displaced rib fractures. No airspace opacity. Visualized upper abdomen is unremarkable. IMPRESSION: No focal airspace opacity. Electronically Signed   By: Lorenza Cambridge M.D.   On: 12/26/2021 15:16    Procedures Procedures    Medications Ordered in ED Medications - No data to display  ED Course/ Medical Decision Making/ A&P                           Medical Decision Making Amount and/or Complexity of Data Reviewed Labs: ordered. Radiology: ordered.   Anthony Russell is here with tachycardia.  Heart rate on my evaluation is 102.  He is well-appearing.  He is already had blood work done prior to my evaluation.  He was sent by primary care doctor to rule out heart issues and lung issues given that he had COVID a few weeks ago.  Patient has clear breath sounds.  No signs of volume overload on exam.  Differential diagnosis is probably symptomatic palpitations versus may be some  dehydration versus may be PE or ACS or myocarditis.  EKG shows sinus tachycardia.  No ischemic changes.  This x-ray per my review and interpretation shows no evidence of pneumonia or pneumothorax.  Heart size is normal.  Troponin is normal.  Per my further review of labs D-dimer is normal and doubt PE.  Doubt ACS.  There is no significant anemia, electrolyte abnormality or kidney injury or leukocytosis.  Overall suspect may be some mild dehydration.  Recommend aggressive hydration at home.  Vital signs are reassuring.  Heart rate in the low 100s here.  Maybe he is developed a normal sinus tachycardia and if he continues to have tachycardia may need to be put on a beta-blocker.  Can follow-up with primary care doctor.  Discharged in good condition.  This chart was dictated using voice recognition software.  Despite best efforts to proofread,  errors can occur which can change the documentation meaning.         Final Clinical Impression(s) / ED Diagnoses Final diagnoses:  Tachycardia    Rx / DC Orders ED Discharge Orders     None         Virgina Norfolk, DO 12/26/21 1923

## 2021-12-26 NOTE — Patient Instructions (Addendum)
Post covid with sinus tachycardia at rest in 130's at home. Lowest pulse recently laying supine at night before sleep at 100.  On walking down hall toady pulse got to 139. Brief drop in 02 sat to 93%. Mild dyspnea at end of walk with faint chest pressure.  EKG today showed rate of 116.   Considering post covid autonomic dis regulation. Maybe side effect of mucinex D.  Recommend emergency department evaluation. I think would benefit from cxr to see if covid pneumonia lingering post infection, labs maybe d dimer and maybe CT of chest if indicated per ED MD.  I have low suspicion but am aware of recent covid.   Discussed with pt plan and called downstairs to ED.  Pt will go downstairs for further work up  Follow up date to be determined after ED evaluation.

## 2021-12-28 ENCOUNTER — Ambulatory Visit: Payer: BC Managed Care – PPO | Attending: Cardiology | Admitting: Cardiology

## 2021-12-28 ENCOUNTER — Encounter: Payer: Self-pay | Admitting: Cardiology

## 2021-12-28 VITALS — BP 110/82 | HR 108 | Ht 70.0 in | Wt 226.2 lb

## 2021-12-28 DIAGNOSIS — R0789 Other chest pain: Secondary | ICD-10-CM | POA: Diagnosis not present

## 2021-12-28 DIAGNOSIS — R000 Tachycardia, unspecified: Secondary | ICD-10-CM | POA: Diagnosis not present

## 2021-12-28 DIAGNOSIS — Z6838 Body mass index (BMI) 38.0-38.9, adult: Secondary | ICD-10-CM

## 2021-12-28 DIAGNOSIS — R0609 Other forms of dyspnea: Secondary | ICD-10-CM

## 2021-12-28 DIAGNOSIS — E6609 Other obesity due to excess calories: Secondary | ICD-10-CM

## 2021-12-28 DIAGNOSIS — E782 Mixed hyperlipidemia: Secondary | ICD-10-CM

## 2021-12-28 NOTE — Patient Instructions (Signed)

## 2021-12-28 NOTE — Progress Notes (Signed)
Cardiology Office Note:    Date:  12/28/2021   ID:  Anthony Russell, DOB 1980/04/12, MRN [id number]  PCP:  Esperanza Richters, PA-C  Cardiologist:  Gypsy Balsam, MD    Referring MD: Esperanza Richters, [place]   Chief Complaint  Patient presents with   Post covid cough    Tachycardia    History of Present Illness:    Anthony Russell is a 41 y.o. male with past medical history significant for diabetes, anxiety with depression, essential hypertension, hyperlipidemia.  He did have CAD workup for atypical chest pain which showed coronary CT angio with calcium score 0 and normal coronaries.  He requested to be seen because about 2 weeks ago he suffered from COVID-19 infection.  He was fairly sick in spite of the fact he had all vaccination.  After that he noticed that he was spitting up.  Part of the treatment that he received for his COVID-19 was steroids as well as some decongestant which provoke his palpitations.  Denies have any chest pain tightness squeezing pressure burning chest complain of being fatigued tired gradually getting better but still not after the par      Past Medical History:  Diagnosis Date   Allergy    Allergy, unspecified not elsewhere classified    rx w/ OTC antihistamines PRN   Anxiety    Asthma    Atypical chest pain    recent neg eval w/ normal cxr/ekg   Depression    Diverticulosis    DM (diabetes mellitus) (HCC)    Dyspepsia    on protonix 40mg /d   Esophagitis    GERD (gastroesophageal reflux disease)    on protonix 40mg /d   HTN (hypertension)    Hyperlipidemia    on diet alone   IBS (irritable bowel syndrome)    Lumbar back pain    s/p lumbar laminectomy 2007 by DrCabbell   Migraines    Overweight(278.02)    weight Jan10=246#. .he was 225# in 9/07...diet and exercise was discussed   Sleep apnea    not wearing c-pap currently    Past Surgical History:  Procedure Laterality Date   DENTAL SURGERY     LUMBAR LAMINECTOMY  01/30/2005   DrCabbell    TEAR DUCT PROBING      Current Medications: Current Meds  Medication Sig   albuterol (VENTOLIN HFA) 108 (90 Base) MCG/ACT inhaler INHALE 2 PUFFS INTO THE LUNGS EVERY 6 HOURS AS NEEDED FOR WHEEZING OR SHORTNESS OF BREATH (Patient taking differently: Inhale 2 puffs into the lungs every 6 (six) hours as needed for wheezing or shortness of breath.)   amoxicillin-clavulanate (AUGMENTIN) 875-125 MG tablet Take 1 tablet by mouth 2 (two) times daily.   atorvastatin (LIPITOR) 10 MG tablet TAKE 1 TABLET(10 MG) BY MOUTH DAILY (Patient taking differently: Take 10 mg by mouth 3 (three) times a week.)   azelastine (ASTELIN) 0.1 % nasal spray Place 1 spray into both nostrils 2 (two) times daily. Use in each nostril as directed   baclofen (LIORESAL) 10 MG tablet Take 1 tablet (10 mg total) by mouth 2 (two) times daily.   benzonatate (TESSALON) 100 MG capsule Take 1 capsule (100 mg total) by mouth 3 (three) times daily as needed. (Patient taking differently: Take 100 mg by mouth 3 (three) times daily as needed for cough.)   calcium carbonate (OS-CAL) 1250 (500 Ca) MG chewable tablet Chew 2 tablets by mouth daily.   Continuous Blood Gluc Sensor (FREESTYLE LIBRE 3 SENSOR) MISC USE TO CHECK  BLOOD GLUCOSE CONTINUOUSLY, CHANGE SENSOR EVERY 14 DAYS (Patient taking differently: 1 each by Other route daily. USE TO CHECK BLOOD GLUCOSE CONTINUOUSLY, CHANGE SENSOR EVERY 14 DAYS)   diclofenac Sodium (PENNSAID) 2 % SOLN Place 2 Pump (40 mg total) onto the skin 2 (two) times daily.   dicyclomine (BENTYL) 10 MG capsule Take 1 capsule (10 mg total) by mouth 3 (three) times daily as needed for spasms (abdominal pain).   famotidine (PEPCID) 20 MG tablet Take 1 tablet (20 mg total) by mouth at bedtime.   fluticasone-salmeterol (ADVAIR HFA) 230-21 MCG/ACT inhaler Inhale 2 puffs into the lungs 2 (two) times daily.   glycopyrrolate (ROBINUL) 2 MG tablet TAKE 1 TABLET BY MOUTH TWICE DAILY AS NEEDED FOR ABDOMINAL DISCOMFORT (Patient  taking differently: Take 1 mg by mouth 2 (two) times daily as needed (abdominal discomfort).)   ibuprofen (ADVIL) 600 MG tablet Take 1 tablet (600 mg total) by mouth every 8 (eight) hours as needed. (Patient taking differently: Take 600 mg by mouth every 8 (eight) hours as needed for mild pain or moderate pain.)   levocetirizine (XYZAL) 5 MG tablet Take 1 tablet (5 mg total) by mouth every evening.   metFORMIN (GLUCOPHAGE) 1000 MG tablet Take 1 tablet (1,000 mg total) by mouth 2 (two) times daily with a meal. TAKE 1 TABLET(1000 MG) BY MOUTH TWICE DAILY WITH A MEAL   Multiple Vitamin (MULTIVITAMIN ADULT PO) Take 2 tablets by mouth daily.   Nutritional Supplements (PYCNOGENOL) 300-30 MG CAPS Take 1 capsule by mouth 3 (three) times daily.   pantoprazole (PROTONIX) 40 MG tablet Take 1 tablet (40 mg total) by mouth 2 (two) times daily before a meal.   predniSONE (DELTASONE) 5 MG tablet Take 6 pills for first day, 5 pills second day, 4 pills third day, 3 pills fourth day, 2 pills the fifth day, and 1 pill sixth day. (Patient taking differently: Take 5 mg by mouth daily with breakfast. Take 6 pills for first day, 5 pills second day, 4 pills third day, 3 pills fourth day, 2 pills the fifth day, and 1 pill sixth day.)   promethazine-dextromethorphan (PROMETHAZINE-DM) 6.25-15 MG/5ML syrup Take 5 mLs by mouth 4 (four) times daily as needed. (Patient taking differently: Take 5 mLs by mouth 4 (four) times daily as needed for cough.)   propranolol (INDERAL) 20 MG tablet Take 1 tablet (20 mg total) by mouth 2 (two) times daily.   [DISCONTINUED] L-Arginine 500 MG CAPS Take 2 capsules by mouth 2 (two) times daily.     Allergies:   Patient has no known allergies.   Social History   Socioeconomic History   Marital status: Single    Spouse name: Not on file   Number of children: 0   Years of education: Not on file   Highest education level: Not on file  Occupational History   Occupation: Systems analyst   Tobacco Use   Smoking status: Former    Packs/day: 0.10    Years: 8.00    Total pack years: 0.80    Types: Cigarettes    Quit date: 03/03/2011    Years since quitting: 10.8   Smokeless tobacco: Never  Vaping Use   Vaping Use: Never used  Substance and Sexual Activity   Alcohol use: Yes    Comment: once or twice a month   Drug use: No   Sexual activity: Not on file  Other Topics Concern   Not on file  Social History Narrative   3 cups  caffeine a day   Social Determinants of Corporate investment bankerHealth   Financial Resource Strain: Not on file  Food Insecurity: Not on file  Transportation Needs: Not on file  Physical Activity: Not on file  Stress: Not on file  Social Connections: Not on file     Family History: The patient's family history includes Allergies in his brother, father, and mother; Breast cancer in his maternal aunt; Diabetes in his father and mother; Heart disease in his brother; Hyperlipidemia in his father; Irritable bowel syndrome in his brother and mother. There is no history of Colon cancer, Esophageal cancer, Rectal cancer, or Stomach cancer. ROS:   Please see the history of present illness.    All 14 point review of systems negative except as described per history of present illness  EKGs/Labs/Other Studies Reviewed:      Recent Labs: 12/26/2021: ALT 29; BUN 10; Creatinine, Ser 0.97; Hemoglobin 15.1; Platelets 224; Potassium 3.8; Sodium 141  Recent Lipid Panel    Component Value Date/Time   CHOL 182 10/27/2021 0715   TRIG 139.0 10/27/2021 0715   HDL 37.60 (L) 10/27/2021 0715   CHOLHDL 5 10/27/2021 0715   VLDL 27.8 10/27/2021 0715   LDLCALC 117 (H) 10/27/2021 0715   LDLDIRECT 132.0 08/14/2019 1103    Physical Exam:    VS:  BP 110/82 (BP Location: Left Arm, Patient Position: Sitting)   Pulse (!) 108   Ht 5\' 10"  (1.778 m)   Wt 226 lb 3.2 oz (102.6 kg)   SpO2 98%   BMI 32.46 kg/m     Wt Readings from Last 3 Encounters:  12/28/21 226 lb 3.2 oz (102.6 kg)   12/26/21 223 lb 15.8 oz (101.6 kg)  12/26/21 224 lb (101.6 kg)     GEN:  Well nourished, well developed in no acute distress HEENT: Normal NECK: No JVD; No carotid bruits LYMPHATICS: No lymphadenopathy CARDIAC: RRR, no murmurs, no rubs, no gallops RESPIRATORY:  Clear to auscultation without rales, wheezing or rhonchi  ABDOMEN: Soft, non-tender, non-distended MUSCULOSKELETAL:  No edema; No deformity  SKIN: Warm and dry LOWER EXTREMITIES: no swelling NEUROLOGIC:  Alert and oriented x 3 PSYCHIATRIC:  Normal affect   ASSESSMENT:    1. Dyspnea on exertion   2. Sinus tachycardia   3. Atypical chest pain   4. Class 2 obesity due to excess calories without serious comorbidity with body mass index (BMI) of 38.0 to 38.9 in adult   5. Mixed hyperlipidemia    PLAN:    In order of problems listed above:  Dyspnea on exertion L recent COVID.  I will ask him to have echocardiogram done to assess left ventricle ejection fraction make sure there is no dealing with COVID-related cardiomyopathy. Palpitations EKG reviewed sinus tachycardia.  I we will check echocardiogram we do see does cases of people having sinus tachycardia somewhat inappropriate after COVID-19 infection usually takes few months to get better and that is what I told him if that became bad with some then small dose of beta-blocker can be used or ivabradine if blood pressure is an issue Diabetes mellitus he said it was a little bit out of control when he was taking steroids but now things come back to normal.  I do have his hemoglobin A1c which was 5.9. Dyslipidemia he is diabetic I did review K PN which showed his LDL 117 HDL 37 he need to be on moderate intensity statin, however he is taking it only 3 times a week Lipitor 10.  Will  continue discussion about potentially augmenting this therapy.  However, first I want him to recover from present sickness   Medication Adjustments/Labs and Tests Ordered: Current medicines are  reviewed at length with the patient today.  Concerns regarding medicines are outlined above.  Orders Placed This Encounter  Procedures   ECHOCARDIOGRAM COMPLETE   Medication changes: No orders of the defined types were placed in this encounter.   Signed, Georgeanna Lea, MD, [facility] 12/28/2021 4:34 PM    [facility]

## 2022-01-03 ENCOUNTER — Encounter: Payer: Self-pay | Admitting: Pulmonary Disease

## 2022-01-03 ENCOUNTER — Ambulatory Visit (INDEPENDENT_AMBULATORY_CARE_PROVIDER_SITE_OTHER): Payer: BC Managed Care – PPO | Admitting: Pulmonary Disease

## 2022-01-03 VITALS — BP 126/68 | HR 104 | Wt 222.8 lb

## 2022-01-03 DIAGNOSIS — J4520 Mild intermittent asthma, uncomplicated: Secondary | ICD-10-CM | POA: Diagnosis not present

## 2022-01-03 DIAGNOSIS — J683 Other acute and subacute respiratory conditions due to chemicals, gases, fumes and vapors: Secondary | ICD-10-CM | POA: Diagnosis not present

## 2022-01-03 NOTE — Patient Instructions (Addendum)
Nice to see you again  The sleep study in June was normal, no sleep apnea, no low oxygen at night  With your persistent symptoms after COVID, I worry about reactivation of asthma.  Use Trelegy 1 puff once a day, rinse your mouth out with water after every use  Send a message and let me know which inhaler you have at home in case we need to continue therapy  You had a blood test in the ED with similar symptoms called a D-dimer.  This was normal.  This is a very strong argument against development of blood clots.  However, if the inhalers are not helping you in the next couple of weeks, please see me a message we need to consider getting a CT scan with contrast to make sure there are no blood clots, COVID puts you at risk for developing blood clots  Return to clinic in 2 months or sooner as needed with Dr. Judeth Horn

## 2022-01-28 ENCOUNTER — Other Ambulatory Visit: Payer: Self-pay | Admitting: Medical

## 2022-01-31 ENCOUNTER — Ambulatory Visit: Payer: BC Managed Care – PPO | Admitting: Family Medicine

## 2022-02-01 ENCOUNTER — Ambulatory Visit (HOSPITAL_BASED_OUTPATIENT_CLINIC_OR_DEPARTMENT_OTHER)
Admission: RE | Admit: 2022-02-01 | Discharge: 2022-02-01 | Disposition: A | Discharge status: Home / Self Care | Payer: BC Managed Care – PPO | Source: Ambulatory Visit | Attending: Cardiology | Admitting: Cardiology

## 2022-02-01 DIAGNOSIS — R0609 Other forms of dyspnea: Secondary | ICD-10-CM | POA: Insufficient documentation

## 2022-02-01 LAB — ECHOCARDIOGRAM COMPLETE
AR max vel: 2.59 cm2
AV Area VTI: 2.64 cm2
AV Area mean vel: 2.54 cm2
AV Mean grad: 4 mmHg
AV Peak grad: 8.8 mmHg
Ao pk vel: 1.48 m/s
Area-P 1/2: 3.53 cm2
S' Lateral: 2.2 cm

## 2022-02-01 MED ORDER — PERFLUTREN LIPID MICROSPHERE
1.0000 mL | INTRAVENOUS | Status: AC | PRN
  Administered 2022-02-01: 3 mL via INTRAVENOUS

## 2022-02-02 ENCOUNTER — Encounter: Payer: Self-pay | Admitting: Pulmonary Disease

## 2022-02-02 MED ORDER — TRELEGY ELLIPTA 200-62.5-25 MCG/ACT IN AEPB: 1.0000 | INHALATION_SPRAY | Freq: Every day | RESPIRATORY_TRACT | 5 refills | Status: DC

## 2022-02-03 ENCOUNTER — Telehealth: Payer: Self-pay

## 2022-02-03 NOTE — Telephone Encounter (Signed)
Patient notified of results through my chart.

## 2022-02-03 NOTE — Telephone Encounter (Signed)
-----   Message from Park Liter, MD sent at 02/02/2022 10:24 AM EST ----- Echocardiogram was normal

## 2022-02-05 ENCOUNTER — Other Ambulatory Visit: Payer: Self-pay | Admitting: Nurse Practitioner

## 2022-03-09 NOTE — Progress Notes (Deleted)
   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 Developed severe headache 09/04/20 which was followed by persistent headaches. MRI and CTA head/neck were unremarkable.   At his last visit, propranolol was decreased to 20 mg daily.  Interval History: Headaches***propranolol***   Headache days per month: *** Migraine days per month*** Headache free days per month: ***  Current Headache Regimen: Preventative: *** Abortive: ***   Prior Therapies                                  Propranolol 20 mg BID Indomethacin - helped temporarily  Physical Exam:   Vital Signs: There were no vitals taken for this visit. GENERAL:  well appearing, in no acute distress, alert  SKIN:  Color, texture, turgor normal. No rashes or lesions HEAD:  Normocephalic/atraumatic. RESP: normal respiratory effort MSK:  No gross joint deformities.   NEUROLOGICAL: Mental Status: Alert, oriented to person, place and time, Follows commands, and Speech fluent and appropriate. Cranial Nerves: PERRL, face symmetric, no dysarthria, hearing grossly intact Motor: moves all extremities equally Gait: normal-based.  IMPRESSION: ***  PLAN: ***   Follow-up: ***  I spent a total of *** minutes on the date of the service. Headache education was done. Discussed lifestyle modification including increased oral hydration, decreased caffeine, exercise and stress management. Discussed treatment options including preventive and acute medications, natural supplements, and infusion therapy. Discussed medication overuse headache and to limit use of acute treatments to no more than 2 days/week or 10 days/month. Discussed medication side effects, adverse reactions and drug interactions. Written educational materials and patient instructions outlining all of the above were given.  Genia Harold, MD

## 2022-03-13 ENCOUNTER — Ambulatory Visit: Payer: BC Managed Care – PPO | Admitting: Psychiatry

## 2022-03-28 NOTE — Progress Notes (Signed)
$'@Patient'x$  ID: Anthony Russell, male    DOB: 07/26/1980, 42 y.o.   MRN: [id number]  Chief Complaint  Patient presents with   Follow-up    Pt is here for follow up for DOE. Pt has HST in 07/05/21 and never got the results. Chest xray on 11/27. Pt had covid back in November. Pt states he is still having issues of running out of breath.     Referring provider: Elise Benne  HPI:   42 y.o. whom we are seeing in follow-up for chest/upper abdominal discomfort and asthma.  Recent GI note reviewed.   Returns for [place]. Seemed to be less of an issue at last visit. Seems worse after recent Covid infection in November 2023, asthma.  He was seen in the ED.  Note reviewed.  D-dimer was normal.  Discussed worry about activation of asthma with recent viral symptom.  Notably, inhalers have not been very beneficial in the past.  Reviewed the results of his home sleep study in June.  This was normal.  Notes apnea, no desaturations.  HPI at initial visit: No real change in symptoms since last visit.  Had EGD.  This showed gastritis.  Change of Symbicort to Advair without improvement in symptoms.  Chief complaint is abdominal discomfort.  Has some heaviness or tightness in his chest when he walks sometimes.  He has a history of asthma.  He has not improved with Advair.  Trial of prednisone 20 mg daily for 5 days and this did not improve symptoms either.  Discussed at length given description of symptoms, tenderness to palpation on exam, that this is unlikely to represent an abnormality in the lung.  Suspect this is related to GI tract.  HPI at initial visit: Notes onset of upper belly pain, low chest pain several months ago.  Seen by GI.  Increasing GERD medications without improvement.  CT abdomen pelvis ordered which was most notable for esophagitis.  Tried sulcal fate without improvement but did increase his sugars.  Unfortunate, developed epiglottitis and upper airway infection 12/2020.  Got dose  dexamethasone.  Improved swelling.  Antibiotics prescribed also help.  Since then he describes a chest pressure discomfort.  Points, substernal.  Worse when he lies supine.  No exertional component.  No dyspnea.  Little bit worse when sitting up compared to standing up.  No other alleviating or exacerbating factors, positional changes, time of day, seasonal or environmental factors he can apply to make things better or worse.  He continues on high-dose Symbicort.  This does not help at all.  Seems to make things worse.  Albuterol provides some mild relief over the course of a couple of hours.  Review chest x-ray 02/11/2021 that on my review interpretation reveals clear lungs bilaterally.  CTA PE protocol from same date reviewed which shows clear lungs, no abnormality on my review and interpretation.  PMH: Hypertension, asthma, diabetes, hyperlipidemia, headaches, sleep apnea Surgical history: Neck surgery 2007  family history: Allergies, CAD in first relatives Social history: Former smoker, quit 2014, 1 pack year history, lives in [place] / Pulmonary Flowsheets:   ACT:      No data to display          MMRC:     No data to display          Epworth:     11/03/2015    3:00 PM  Results of the Epworth flowsheet  Sitting and reading 2  Watching TV  2  Sitting, inactive in a public place (e.g. a theatre or a meeting) 2  As a passenger in a car for an hour without a break 2  Lying down to rest in the afternoon when circumstances permit 3  Sitting and talking to someone 1  Sitting quietly after a lunch without alcohol 2  In a car, while stopped for a few minutes in traffic 2  Total score 16    Tests:   FENO:  No results found for: "NITRICOXIDE"  PFT:    Latest Ref Rng & Units 06/08/2021   11:48 AM  PFT Results  FVC-Pre L 4.33   FVC-Predicted Pre % 84   FVC-Post L 3.82   FVC-Predicted Post % 74   Pre FEV1/FVC % % 66   Post FEV1/FCV % % 73   FEV1-Pre L  2.85   FEV1-Predicted Pre % 69   FEV1-Post L 2.79   DLCO uncorrected ml/min/mmHg 37.34   DLCO UNC% % 123   DLCO corrected ml/min/mmHg 37.34   DLCO COR %Predicted % 123   DLVA Predicted % 141   TLC L 5.88   TLC % Predicted % 87   RV % Predicted % 95    WALK:      No data to display          Imaging: Personally reviewed and as per EMR discussion this note No results found.  Lab Results: Personally reviewed, no anemia, no significant elevation in eosinophils CBC    Component Value Date/Time   WBC 9.1 12/26/2021 1515   RBC 5.09 12/26/2021 1515   HGB 15.1 12/26/2021 1515   HCT 45.2 12/26/2021 1515   PLT 224 12/26/2021 1515   MCV 88.8 12/26/2021 1515   MCH 29.7 12/26/2021 1515   MCHC 33.4 12/26/2021 1515   RDW 13.7 12/26/2021 1515   LYMPHSABS 2.2 12/26/2021 1515   MONOABS 0.7 12/26/2021 1515   EOSABS 0.1 12/26/2021 1515   BASOSABS 0.1 12/26/2021 1515    BMET    Component Value Date/Time   NA 141 12/26/2021 1515   NA 144 03/10/2021 0910   K 3.8 12/26/2021 1515   CL 103 12/26/2021 1515   CO2 27 12/26/2021 1515   GLUCOSE 148 (H) 12/26/2021 1515   BUN 10 12/26/2021 1515   BUN 15 03/10/2021 0910   CREATININE 0.97 12/26/2021 1515   CALCIUM 9.2 12/26/2021 1515   GFRNONAA >60 12/26/2021 1515   GFRAA >60 03/29/2018 1706    BNP No results found for: "BNP"  ProBNP No results found for: "PROBNP"  Specialty Problems       Pulmonary Problems   Reactive airways dysfunction syndrome (HCC)   Allergic rhinitis   Asthma in adult   Acute bacterial bronchitis   Asymmetric tonsils    Last Assessment & Plan:  Formatting of this note might be different from the original. Concern over tonsil asymmetry. Noted on recent imaging of the head.  Denies any symptoms.  Smoked in the distant past. EXAM shows relatively symmetric appearing tonsils.  Both are soft on palpation.  No adenopathy in the neck. PLAN: Reassured all looks okay.  No further treatment or evaluation  necessary.      Laryngitis   Nasal septal deviation   Sleep apnea   Dyspnea on exertion    No Known Allergies  Immunization History  Administered Date(s) Administered   DTP 11/12/1980, 01/18/1981   Hepatitis B 10/12/1995, 11/15/1995, 05/02/1996   Influenza,inj,Quad PF,6+ Mos 11/16/2020   MMR 12/03/1981, 11/15/1995  PFIZER(Purple Top)SARS-COV-2 Vaccination 04/26/2019, 05/18/2019, 01/01/2020   Pneumococcal Polysaccharide-23 04/05/2020   Tdap 08/13/2012    Past Medical History:  Diagnosis Date   Allergy    Allergy, unspecified not elsewhere classified    rx w/ OTC antihistamines PRN   Anxiety    Asthma    Atypical chest pain    recent neg eval w/ normal cxr/ekg   Depression    Diverticulosis    DM (diabetes mellitus) ([place])    Dyspepsia    on protonix '40mg'$ /d   Esophagitis    GERD (gastroesophageal reflux disease)    on protonix '40mg'$ /d   HTN (hypertension)    Hyperlipidemia    on diet alone   IBS (irritable bowel syndrome)    Lumbar back pain    s/p lumbar laminectomy 2007 by DrCabbell   Migraines    Overweight(278.02)    weight Jan10=246#.[name].he was 225# in 9/07...diet and exercise was discussed   Sleep apnea    not wearing c-pap currently    Tobacco History: Social History   Tobacco Use  Smoking Status Former   Packs/day: 0.10   Years: 8.00   Total pack years: 0.80   Types: Cigarettes   Quit date: 03/03/2011   Years since quitting: 11.0  Smokeless Tobacco Never   Counseling given: Not Answered   Continue to not smoke  Outpatient Encounter Medications as of 01/03/2022  Medication Sig   albuterol (VENTOLIN HFA) 108 (90 Base) MCG/ACT inhaler INHALE 2 PUFFS INTO THE LUNGS EVERY 6 HOURS AS NEEDED FOR WHEEZING OR SHORTNESS OF BREATH (Patient taking differently: Inhale 2 puffs into the lungs every 6 (six) hours as needed for wheezing or shortness of breath.)   atorvastatin (LIPITOR) 10 MG tablet TAKE 1 TABLET(10 MG) BY MOUTH DAILY (Patient taking  differently: Take 10 mg by mouth 3 (three) times a week.)   azelastine (ASTELIN) 0.1 % nasal spray Place 1 spray into both nostrils 2 (two) times daily. Use in each nostril as directed   baclofen (LIORESAL) 10 MG tablet Take 1 tablet (10 mg total) by mouth 2 (two) times daily.   calcium carbonate (OS-CAL) 1250 (500 Ca) MG chewable tablet Chew 2 tablets by mouth daily.   diclofenac Sodium (PENNSAID) 2 % SOLN Place 2 Pump (40 mg total) onto the skin 2 (two) times daily.   dicyclomine (BENTYL) 10 MG capsule Take 1 capsule (10 mg total) by mouth 3 (three) times daily as needed for spasms (abdominal pain).   famotidine (PEPCID) 20 MG tablet Take 1 tablet (20 mg total) by mouth at bedtime.   glycopyrrolate (ROBINUL) 2 MG tablet TAKE 1 TABLET BY MOUTH TWICE DAILY AS NEEDED FOR ABDOMINAL DISCOMFORT (Patient taking differently: Take 1 mg by mouth 2 (two) times daily as needed (abdominal discomfort).)   ibuprofen (ADVIL) 600 MG tablet Take 1 tablet (600 mg total) by mouth every 8 (eight) hours as needed. (Patient taking differently: Take 600 mg by mouth every 8 (eight) hours as needed for mild pain or moderate pain.)   levocetirizine (XYZAL) 5 MG tablet Take 1 tablet (5 mg total) by mouth every evening.   Multiple Vitamin (MULTIVITAMIN ADULT PO) Take 2 tablets by mouth daily.   Nutritional Supplements (PYCNOGENOL) 300-30 MG CAPS Take 1 capsule by mouth 3 (three) times daily.   propranolol (INDERAL) 20 MG tablet Take 1 tablet (20 mg total) by mouth 2 (two) times daily.   [DISCONTINUED] amoxicillin-clavulanate (AUGMENTIN) 875-125 MG tablet Take 1 tablet by mouth 2 (two) times daily.   [  DISCONTINUED] benzonatate (TESSALON) 100 MG capsule Take 1 capsule (100 mg total) by mouth 3 (three) times daily as needed. (Patient taking differently: Take 100 mg by mouth 3 (three) times daily as needed for cough.)   [DISCONTINUED] Continuous Blood Gluc Sensor (FREESTYLE LIBRE 3 SENSOR) MISC USE TO CHECK BLOOD GLUCOSE  CONTINUOUSLY, CHANGE SENSOR EVERY 14 DAYS (Patient taking differently: 1 each by Other route daily. USE TO CHECK BLOOD GLUCOSE CONTINUOUSLY, CHANGE SENSOR EVERY 14 DAYS)   [DISCONTINUED] fluticasone-salmeterol (ADVAIR HFA) 230-21 MCG/ACT inhaler Inhale 2 puffs into the lungs 2 (two) times daily.   [DISCONTINUED] pantoprazole (PROTONIX) 40 MG tablet Take 1 tablet (40 mg total) by mouth 2 (two) times daily before a meal.   [DISCONTINUED] predniSONE (DELTASONE) 5 MG tablet Take 6 pills for first day, 5 pills second day, 4 pills third day, 3 pills fourth day, 2 pills the fifth day, and 1 pill sixth day. (Patient taking differently: Take 5 mg by mouth daily with breakfast. Take 6 pills for first day, 5 pills second day, 4 pills third day, 3 pills fourth day, 2 pills the fifth day, and 1 pill sixth day.)   [DISCONTINUED] promethazine-dextromethorphan (PROMETHAZINE-DM) 6.25-15 MG/5ML syrup Take 5 mLs by mouth 4 (four) times daily as needed. (Patient taking differently: Take 5 mLs by mouth 4 (four) times daily as needed for cough.)   [DISCONTINUED] metFORMIN (GLUCOPHAGE) 1000 MG tablet Take 1 tablet (1,000 mg total) by mouth 2 (two) times daily with a meal. TAKE 1 TABLET(1000 MG) BY MOUTH TWICE DAILY WITH A MEAL   No facility-administered encounter medications on file as of 01/03/2022.     Review of Systems  Review of Systems  N/a Physical Exam  BP 126/68 (BP Location: Left Arm, Patient Position: Sitting, Cuff Size: Normal)   Pulse (!) 104   Wt 222 lb 12.8 oz (101.1 kg)   SpO2 96%   BMI 31.97 kg/m   Wt Readings from Last 5 Encounters:  01/03/22 222 lb 12.8 oz (101.1 kg)  12/28/21 226 lb 3.2 oz (102.6 kg)  12/26/21 223 lb 15.8 oz (101.6 kg)  12/26/21 224 lb (101.6 kg)  12/20/21 220 lb (99.8 kg)    BMI Readings from Last 5 Encounters:  01/03/22 31.97 kg/m  12/28/21 32.46 kg/m  12/26/21 32.14 kg/m  12/26/21 32.14 kg/m  12/20/21 31.57 kg/m     Physical Exam General: Sitting in  chair, no acute distress Eyes: EOMI, icterus Neck: Supple, no JVP Pulmonary: Clear, no work of breathing, good air movement Cardiovascular: Regular rhythm, no murmur Abdomen: Tender to palpation epigastric area MSK: No synovitis, joint effusion Neuro: Normal gait, no weakness Psych: Normal mood, flat affect   Assessment & Plan:   Dyspnea exertion: Worried about reactivation of asthma, below.  Normal D-dimer in the ED, low likelihood, essentially rules out possibility of PE.  Asthma: Better controlled in the past.  On various inhalers.  None recently.  New prescription for Trelegy today.  Continue albuterol as needed.  Return in about 2 months (around 03/06/2022).   Lanier Clam, MD 03/28/2022

## 2022-04-21 ENCOUNTER — Other Ambulatory Visit: Payer: Self-pay | Admitting: Medical

## 2022-04-22 ENCOUNTER — Encounter: Payer: Self-pay | Admitting: Medical

## 2022-04-24 NOTE — Addendum Note (Signed)
Addended by: Anabel Halon on: 04/24/2022 08:14 PM   Modules accepted: Orders

## 2022-04-25 ENCOUNTER — Ambulatory Visit (INDEPENDENT_AMBULATORY_CARE_PROVIDER_SITE_OTHER): Payer: BC Managed Care – PPO | Admitting: Medical

## 2022-04-25 VITALS — BP 128/78 | HR 110 | Temp 98.3°F | Ht 70.0 in | Wt 223.0 lb

## 2022-04-25 DIAGNOSIS — U071 COVID-19: Secondary | ICD-10-CM | POA: Diagnosis not present

## 2022-04-25 DIAGNOSIS — J029 Acute pharyngitis, unspecified: Secondary | ICD-10-CM | POA: Diagnosis not present

## 2022-04-25 DIAGNOSIS — R52 Pain, unspecified: Secondary | ICD-10-CM | POA: Diagnosis not present

## 2022-04-25 DIAGNOSIS — E119 Type 2 diabetes mellitus without complications: Secondary | ICD-10-CM

## 2022-04-25 LAB — POCT RAPID STREP A (OFFICE): Rapid Strep A Screen: NEGATIVE

## 2022-04-25 LAB — POC COVID19 BINAXNOW: SARS Coronavirus 2 Ag: POSITIVE — AB

## 2022-04-25 MED ORDER — NIRMATRELVIR/RITONAVIR (PAXLOVID)TABLET: 3.0000 | ORAL_TABLET | Freq: Two times a day (BID) | ORAL | 0 refills | Status: AC

## 2022-04-25 MED ORDER — FLUTICASONE PROPIONATE 50 MCG/ACT NA SUSP: 2.0000 | Freq: Every day | NASAL | 1 refills | Status: DC

## 2022-04-25 MED ORDER — BENZONATATE 100 MG PO CAPS: 100.0000 mg | ORAL_CAPSULE | Freq: Three times a day (TID) | ORAL | 0 refills | Status: DC | PRN

## 2022-04-25 NOTE — Progress Notes (Signed)
Subjective:    Patient ID: Anthony Russell, male    DOB: 1980/10/03, 42 y.o.   MRN: [id number]  HPI  Pt states for one day st, nasal congestion, rare cough and some fatigue. Pt did not get covid vaccine this past year. Pt got covid in November last year. Pt took paxlovid last time in November when he got infection. Pt has asthma and diabetes. Pt states was on trelegy but has not used it for months.   Pt has diabetes he has been doing phd diet. Has been doing this diet for one 10 days and lost 11 lbs. Pt wanted to know if he can stop the metformin. He has been on 1000 mg twice daily.   Since being on phd diet his sugars have not get brief spkes over 200. Pt has cgm so he could see the rise.     Review of Systems  Constitutional:  Positive for fatigue. Negative for chills and fever.  HENT:  Positive for congestion, postnasal drip and sore throat. Negative for ear pain, sinus pressure and sinus pain.   Respiratory:  Negative for cough, chest tightness, shortness of breath and wheezing.   Cardiovascular:  Negative for chest pain and palpitations.  Gastrointestinal:  Negative for abdominal distention, abdominal pain, anal bleeding, blood in stool, diarrhea and nausea.  Genitourinary:  Negative for dysuria, flank pain and frequency.  Musculoskeletal:  Negative for back pain.  Neurological:  Negative for dizziness, speech difficulty, numbness and headaches.  Hematological:  Negative for adenopathy. Does not bruise/bleed easily.  Psychiatric/Behavioral:  Negative for behavioral problems, decreased concentration and sleep disturbance. The patient is not nervous/anxious.    Past Medical History:  Diagnosis Date   Allergy    Allergy, unspecified not elsewhere classified    rx w/ OTC antihistamines PRN   Anxiety    Asthma    Atypical chest pain    recent neg eval w/ normal cxr/ekg   Depression    Diverticulosis    DM (diabetes mellitus) ([place])    Dyspepsia    on protonix 40mg /d    Esophagitis    GERD (gastroesophageal reflux disease)    on protonix 40mg /d   HTN (hypertension)    Hyperlipidemia    on diet alone   IBS (irritable bowel syndrome)    Lumbar back pain    s/p lumbar laminectomy 2007 by DrCabbell   Migraines    Overweight(278.02)    weight Jan10=246#.[name].he was 225# in 9/07...diet and exercise was discussed   Sleep apnea    not wearing c-pap currently     Social History   Socioeconomic History   Marital status: Single    Spouse name: Not on file   Number of children: 0   Years of education: Not on file   Highest education level: Associate degree: academic program  Occupational History   Occupation: Gaffer  Tobacco Use   Smoking status: Former    Packs/day: 0.10    Years: 8.00    Additional pack years: 0.00    Total pack years: 0.80    Types: Cigarettes    Quit date: 03/03/2011    Years since quitting: 11.1   Smokeless tobacco: Never  Vaping Use   Vaping Use: Never used  Substance and Sexual Activity   Alcohol use: Yes    Comment: once or twice a month   Drug use: No   Sexual activity: Not on file  Other Topics Concern   Not on file  Social  History Narrative   3 cups caffeine a day   Social Determinants of Health   Financial Resource Strain: Low Risk  (04/24/2022)   Overall Financial Resource Strain (CARDIA)    Difficulty of Paying Living Expenses: Not very hard  Food Insecurity: No Food Insecurity (04/24/2022)   Hunger Vital Sign    Worried About Running Out of Food in the Last Year: Never true    Ran Out of Food in the Last Year: Never true  Transportation Needs: No Transportation Needs (04/24/2022)   PRAPARE - Hydrologist (Medical): No    Lack of Transportation (Non-Medical): No  Physical Activity: Insufficiently Active (04/24/2022)   Exercise Vital Sign    Days of Exercise per Week: 3 days    Minutes of Exercise per Session: 10 min  Stress: No Stress Concern Present (04/24/2022)    [place]    Feeling of Stress : Only a little  Social Connections: Socially Isolated (04/24/2022)   Social Connection and Isolation Panel [NHANES]    Frequency of Communication with Friends and Family: Once a week    Frequency of Social Gatherings with Friends and Family: Once a week    Attends Religious Services: Never    Marine scientist or Organizations: No    Attends Music therapist: Not on file    Marital Status: Never married  Human resources officer Violence: Not on file    Past Surgical History:  Procedure Laterality Date   DENTAL SURGERY     LUMBAR LAMINECTOMY  01/30/2005   DrCabbell   TEAR DUCT PROBING      Family History  Problem Relation Age of Onset   Allergies Mother    Irritable bowel syndrome Mother    Diabetes Mother    Allergies Father    Diabetes Father    Hyperlipidemia Father    Irritable bowel syndrome Brother    Heart disease Brother    Allergies Brother    Breast cancer Maternal Aunt    Colon cancer Neg Hx    Esophageal cancer Neg Hx    Rectal cancer Neg Hx    Stomach cancer Neg Hx     No Known Allergies  Current Outpatient Medications on File Prior to Visit  Medication Sig Dispense Refill   albuterol (VENTOLIN HFA) 108 (90 Base) MCG/ACT inhaler INHALE 2 PUFFS INTO THE LUNGS EVERY 6 HOURS AS NEEDED FOR WHEEZING OR SHORTNESS OF BREATH (Patient taking differently: Inhale 2 puffs into the lungs every 6 (six) hours as needed for wheezing or shortness of breath.) 20.1 g 0   atorvastatin (LIPITOR) 10 MG tablet TAKE 1 TABLET(10 MG) BY MOUTH DAILY (Patient taking differently: Take 10 mg by mouth 3 (three) times a week.) 90 tablet 3   azelastine (ASTELIN) 0.1 % nasal spray Place 1 spray into both nostrils 2 (two) times daily. Use in each nostril as directed 30 mL 0   baclofen (LIORESAL) 10 MG tablet Take 1 tablet (10 mg total) by mouth 2 (two) times daily. 30 each 3   calcium  carbonate (OS-CAL) 1250 (500 Ca) MG chewable tablet Chew 2 tablets by mouth daily.     Continuous Blood Gluc Sensor (FREESTYLE LIBRE 3 SENSOR) MISC USE TO CHECK BLOOD GLUCOSE CONTINOUSLY. CHANGE EVERY 14 DAYS 2 each 1   diclofenac Sodium (PENNSAID) 2 % SOLN Place 2 Pump (40 mg total) onto the skin 2 (two) times daily. 112 g 2  dicyclomine (BENTYL) 10 MG capsule Take 1 capsule (10 mg total) by mouth 3 (three) times daily as needed for spasms (abdominal pain). 30 capsule 0   famotidine (PEPCID) 20 MG tablet Take 1 tablet (20 mg total) by mouth at bedtime. 30 tablet 3   Fluticasone-Umeclidin-Vilant (TRELEGY ELLIPTA) 200-62.5-25 MCG/ACT AEPB Inhale 1 puff into the lungs daily. 60 each 5   glycopyrrolate (ROBINUL) 2 MG tablet TAKE 1 TABLET BY MOUTH TWICE DAILY AS NEEDED FOR ABDOMINAL DISCOMFORT (Patient taking differently: Take 1 mg by mouth 2 (two) times daily as needed (abdominal discomfort).) 60 tablet 2   ibuprofen (ADVIL) 600 MG tablet Take 1 tablet (600 mg total) by mouth every 8 (eight) hours as needed. (Patient taking differently: Take 600 mg by mouth every 8 (eight) hours as needed for mild pain or moderate pain.) 30 tablet 0   levocetirizine (XYZAL) 5 MG tablet Take 1 tablet (5 mg total) by mouth every evening. 30 tablet 3   metFORMIN (GLUCOPHAGE) 1000 MG tablet TAKE 1 TABLET(1000 MG) BY MOUTH TWICE DAILY WITH A MEAL 30 tablet 2   Multiple Vitamin (MULTIVITAMIN ADULT PO) Take 2 tablets by mouth daily.     Nutritional Supplements (PYCNOGENOL) 300-30 MG CAPS Take 1 capsule by mouth 3 (three) times daily.     pantoprazole (PROTONIX) 40 MG tablet TAKE 1 TABLET(40 MG) BY MOUTH TWICE DAILY BEFORE A MEAL 60 tablet 6   propranolol (INDERAL) 20 MG tablet Take 1 tablet (20 mg total) by mouth 2 (two) times daily. 60 tablet 3   No current facility-administered medications on file prior to visit.    BP 128/78   Pulse (!) 110   Temp 98.3 F (36.8 C)   Ht 5\' 10"  (1.778 m)   Wt 223 lb (101.2 kg)   SpO2  98%   BMI 32.00 kg/m        Objective:   Physical Exam  General Mental Status- Alert. General Appearance- Not in acute distress.   Skin General: Color- Normal Color. Moisture- Normal Moisture.  Neck Carotid Arteries- Normal color. Moisture- Normal Moisture. No carotid bruits. No JVD.  Chest and Lung Exam Auscultation: Breath Sounds:-Normal.  Cardiovascular Auscultation:Rythm- Regular. Murmurs & Other Heart Sounds:Auscultation of the heart reveals- No Murmurs.  Abdomen Inspection:-Inspeection Normal. Palpation/Percussion:Note:No mass. Palpation and Percussion of the abdomen reveal- Non Tender, Non Distended + BS, no rebound or guarding.    NeurologicCranial Nerve exam:-  CN III-XII intact(No nystagmus), symmetric smile. Strength:- 5/5 equal and symmetric strength both upper and lower extremities.   Heent- no frontal or maxillary sinus pressure. +ond. Canals clear. Normal tms.      Assessment & Plan:   Patient Instructions  Sore throat -     POCT rapid strep A  Body aches -     POC COVID-19 BinaxNow  Type 2 diabetes mellitus without complication, without long-term current use of insulin ([place])  COVID-19 virus infection  Other orders -     nirmatrelvir/ritonavir; Take 3 tablets by mouth 2 (two) times daily for 5 days. (Take nirmatrelvir 150 mg two tablets twice daily for 5 days and ritonavir 100 mg one tablet twice daily for 5 days) Patient GFR is 97  Dispense: 30 tablet; Refill: 0 -     Fluticasone Propionate; Place 2 sprays into both nostrils daily.  Dispense: 16 g; Refill: 1 -     Benzonatate; Take 1 capsule (100 mg total) by mouth 3 (three) times daily as needed for cough.  Dispense: 30 capsule; Refill:  0   Strep negative but covid infection. Take above meds. If getting bronchitis or sinus infection symptoms over next 7-10 days notify me and would rx azithromyin.  Asthma rare and controlled but recommend going ahead and starting trelegy pulmonologist rx'd  in past. Also use albuterol as needed very 4-6 hours.   For diabetes continue metformin. Get cmp and A1c next Monday or Tuesday. If with new diet sugars well controlled and if A1c less than 6 then can cut back on metformin to 500 mg twice daily.  Follow up in 3 month from lab date or sooner if needed.    Mackie Pai, PA-C

## 2022-04-25 NOTE — Patient Instructions (Addendum)
Sore throat -     POCT rapid strep A  Body aches -     POC COVID-19 BinaxNow  Type 2 diabetes mellitus without complication, without long-term current use of insulin ([place])  COVID-19 virus infection  Other orders -     nirmatrelvir/ritonavir; Take 3 tablets by mouth 2 (two) times daily for 5 days. (Take nirmatrelvir 150 mg two tablets twice daily for 5 days and ritonavir 100 mg one tablet twice daily for 5 days) Patient GFR is 97  Dispense: 30 tablet; Refill: 0 -     Fluticasone Propionate; Place 2 sprays into both nostrils daily.  Dispense: 16 g; Refill: 1 -     Benzonatate; Take 1 capsule (100 mg total) by mouth 3 (three) times daily as needed for cough.  Dispense: 30 capsule; Refill: 0   Strep negative but covid infection. Take above meds. If getting bronchitis or sinus infection symptoms over next 7-10 days notify me and would rx azithromyin.  Asthma rare and controlled but recommend going ahead and starting trelegy pulmonologist rx'd in past. Also use albuterol as needed very 4-6 hours.   For diabetes continue metformin. Get cmp and A1c next Monday or Tuesday. If with new diet sugars well controlled and if A1c less than 6 then can cut back on metformin to 500 mg twice daily.  Follow up in 3 month from lab date or sooner if needed.

## 2022-04-26 ENCOUNTER — Ambulatory Visit: Payer: BC Managed Care – PPO | Admitting: Family Medicine

## 2022-05-02 ENCOUNTER — Other Ambulatory Visit (INDEPENDENT_AMBULATORY_CARE_PROVIDER_SITE_OTHER): Payer: BC Managed Care – PPO

## 2022-05-02 DIAGNOSIS — E119 Type 2 diabetes mellitus without complications: Secondary | ICD-10-CM

## 2022-05-02 LAB — COMPREHENSIVE METABOLIC PANEL
ALT: 24 U/L (ref 0–53)
AST: 17 U/L (ref 0–37)
Albumin: 4.8 g/dL (ref 3.5–5.2)
Alkaline Phosphatase: 52 U/L (ref 39–117)
BUN: 14 mg/dL (ref 6–23)
CO2: 32 mEq/L (ref 19–32)
Calcium: 9.7 mg/dL (ref 8.4–10.5)
Chloride: 103 mEq/L (ref 96–112)
Creatinine, Ser: 0.89 mg/dL (ref 0.40–1.50)
GFR: 106.34 mL/min (ref >60.00)
Glucose, Bld: 81 mg/dL (ref 70–99)
Potassium: 4.4 mEq/L (ref 3.5–5.1)
Sodium: 142 mEq/L (ref 135–145)
Total Bilirubin: 0.4 mg/dL (ref 0.2–1.2)
Total Protein: 7.3 g/dL (ref 6.0–8.3)

## 2022-05-02 LAB — HEMOGLOBIN A1C: Hgb A1c MFr Bld: 5.9 % (ref 4.6–6.5)

## 2022-05-10 ENCOUNTER — Encounter: Payer: Self-pay | Admitting: Family Medicine

## 2022-05-10 ENCOUNTER — Ambulatory Visit (INDEPENDENT_AMBULATORY_CARE_PROVIDER_SITE_OTHER): Payer: BC Managed Care – PPO | Admitting: Family Medicine

## 2022-05-10 VITALS — BP 118/78 | Ht 70.0 in | Wt 223.0 lb

## 2022-05-10 DIAGNOSIS — M778 Other enthesopathies, not elsewhere classified: Secondary | ICD-10-CM | POA: Insufficient documentation

## 2022-05-10 DIAGNOSIS — M7918 Myalgia, other site: Secondary | ICD-10-CM

## 2022-05-10 DIAGNOSIS — M5416 Radiculopathy, lumbar region: Secondary | ICD-10-CM | POA: Diagnosis not present

## 2022-05-10 MED ORDER — KETOROLAC TROMETHAMINE 30 MG/ML IJ SOLN
30.0000 mg | Freq: Once | INTRAMUSCULAR | Status: AC
  Administered 2022-05-10: 30 mg via INTRAMUSCULAR

## 2022-05-10 MED ORDER — METHYLPREDNISOLONE ACETATE 40 MG/ML IJ SUSP
40.0000 mg | Freq: Once | INTRAMUSCULAR | Status: AC
  Administered 2022-05-10: 40 mg via INTRAMUSCULAR

## 2022-05-10 NOTE — Assessment & Plan Note (Signed)
Acutely occurring.  Having pain in the anterior thigh.  Possible to be more of a hip impingement given the pain with hip external rotation and flexion.  Previous CT scan of his pelvis did not demonstrate any significant degenerative changes.  Previous MRI of his lumbar spine does demonstrate disc bulge and degenerative changes and scoliosis -Counseled on home exercise therapy and supportive care. -IM Depo-Medrol and Toradol. - Could consider physical therapy or epidural

## 2022-05-10 NOTE — Patient Instructions (Signed)
Good to see you  Please try heat  Please try the exercises  We'll call with the results  Please send me a message in MyChart with any questions or updates.  Please see me back in 2-3 weeks.   --Dr. Jordan Likes

## 2022-05-10 NOTE — Assessment & Plan Note (Signed)
Acute on chronic in nature.  He does have swelling and weakness of the upper extremity.  Unclear if this is related to his glucose monitoring versus a radicular symptom or a polymyalgia picture. -Counseled on home exercise therapy and supportive care. -CK, cbc, LDH and aldolase sed rate and crp

## 2022-05-10 NOTE — Progress Notes (Signed)
  Anthony Russell - 42 y.o. male MRN [id number]  Date of birth: 1980/07/30  SUBJECTIVE:  Including CC & ROS.  No chief complaint on file.   Anthony Russell is a 42 y.o. male that is presenting with right leg pain.  The pain has been ongoing for the past 3 weeks.  Has a history of laminectomy in 2007.  He does feel pain in the anterior thigh that radiates down to the lower leg.  He feels this with certain movements.  No injury or inciting event.  He does feel that with walking as well.  He feels like he is having sensation.  She also has a fullness and weakness of his shoulders.  This has been ongoing since November.  He does use his blood sugar monitoring in the left inner upper arm.    Review of Systems See HPI   HISTORY: Past Medical, Surgical, Social, and Family History Reviewed & Updated per EMR.   Pertinent Historical Findings include:  Past Medical History:  Diagnosis Date   Allergy    Allergy, unspecified not elsewhere classified    rx w/ OTC antihistamines PRN   Anxiety    Asthma    Atypical chest pain    recent neg eval w/ normal cxr/ekg   Depression    Diverticulosis    DM (diabetes mellitus)    Dyspepsia    on protonix 40mg /d   Esophagitis    GERD (gastroesophageal reflux disease)    on protonix 40mg /d   HTN (hypertension)    Hyperlipidemia    on diet alone   IBS (irritable bowel syndrome)    Lumbar back pain    s/p lumbar laminectomy 2007 by DrCabbell   Migraines    Overweight(278.02)    weight Jan10=246#.[name].he was 225# in 9/07...diet and exercise was discussed   Sleep apnea    not wearing c-pap currently    Past Surgical History:  Procedure Laterality Date   DENTAL SURGERY     LUMBAR LAMINECTOMY  01/30/2005   DrCabbell   TEAR DUCT PROBING       PHYSICAL EXAM:  VS: BP 118/78 (BP Location: Left Arm, Patient Position: Sitting)   Ht 5\' 10"  (1.778 m)   Wt 223 lb (101.2 kg)   BMI 32.00 kg/m  Physical Exam Gen: NAD, alert, cooperative with exam,  well-appearing MSK:  Neurovascularly intact       ASSESSMENT & PLAN:   Lumbar radiculopathy Acutely occurring.  Having pain in the anterior thigh.  Possible to be more of a hip impingement given the pain with hip external rotation and flexion.  Previous CT scan of his pelvis did not demonstrate any significant degenerative changes.  Previous MRI of his lumbar spine does demonstrate disc bulge and degenerative changes and scoliosis -Counseled on home exercise therapy and supportive care. -IM Depo-Medrol and Toradol. - Could consider physical therapy or epidural  Pain in muscle of shoulder Acute on chronic in nature.  He does have swelling and weakness of the upper extremity.  Unclear if this is related to his glucose monitoring versus a radicular symptom or a polymyalgia picture. -Counseled on home exercise therapy and supportive care. -CK, cbc, LDH and aldolase sed rate and crp

## 2022-05-11 LAB — C-REACTIVE PROTEIN: CRP: 2 mg/L (ref 0–10)

## 2022-05-11 LAB — SEDIMENTATION RATE: Sed Rate: 2 mm/hr (ref 0–15)

## 2022-05-12 ENCOUNTER — Telehealth: Payer: Self-pay | Admitting: Family Medicine

## 2022-05-12 LAB — CBC
Hematocrit: 47.7 % (ref 37.5–51.0)
Hemoglobin: 15.6 g/dL (ref 13.0–17.7)
MCH: 29.1 pg (ref 26.6–33.0)
MCHC: 32.7 g/dL (ref 31.5–35.7)
MCV: 89 fL (ref 79–97)
Platelets: 185 10*3/uL (ref 150–450)
RBC: 5.36 x10E6/uL (ref 4.14–5.80)
RDW: 14 % (ref 11.6–15.4)
WBC: 5.6 10*3/uL (ref 3.4–10.8)

## 2022-05-12 LAB — ALDOLASE: Aldolase: 6.2 U/L (ref 3.3–10.3)

## 2022-05-12 LAB — LACTATE DEHYDROGENASE: LDH: 151 IU/L (ref 121–224)

## 2022-05-12 LAB — CK: Total CK: 83 U/L (ref 49–439)

## 2022-05-12 NOTE — Telephone Encounter (Signed)
Informed of results.   Myra Rude, MD [facility] 05/12/2022, 10:02 AM

## 2022-05-15 ENCOUNTER — Encounter: Payer: Self-pay | Admitting: *Deleted

## 2022-05-24 ENCOUNTER — Ambulatory Visit (INDEPENDENT_AMBULATORY_CARE_PROVIDER_SITE_OTHER): Payer: BC Managed Care – PPO | Admitting: Family Medicine

## 2022-05-24 ENCOUNTER — Encounter: Payer: Self-pay | Admitting: Family Medicine

## 2022-05-24 ENCOUNTER — Ambulatory Visit (HOSPITAL_BASED_OUTPATIENT_CLINIC_OR_DEPARTMENT_OTHER)
Admission: RE | Admit: 2022-05-24 | Discharge: 2022-05-24 | Disposition: A | Discharge status: Home / Self Care | Payer: BC Managed Care – PPO | Source: Ambulatory Visit | Attending: Family Medicine | Admitting: Family Medicine

## 2022-05-24 VITALS — BP 110/74 | Ht 70.0 in | Wt 202.0 lb

## 2022-05-24 DIAGNOSIS — Z20822 Contact with and (suspected) exposure to covid-19: Secondary | ICD-10-CM | POA: Diagnosis not present

## 2022-05-24 DIAGNOSIS — M25512 Pain in left shoulder: Secondary | ICD-10-CM | POA: Diagnosis not present

## 2022-05-24 DIAGNOSIS — M7581 Other shoulder lesions, right shoulder: Secondary | ICD-10-CM | POA: Insufficient documentation

## 2022-05-24 DIAGNOSIS — M778 Other enthesopathies, not elsewhere classified: Secondary | ICD-10-CM | POA: Insufficient documentation

## 2022-05-24 DIAGNOSIS — M5416 Radiculopathy, lumbar region: Secondary | ICD-10-CM | POA: Diagnosis not present

## 2022-05-24 DIAGNOSIS — M19011 Primary osteoarthritis, right shoulder: Secondary | ICD-10-CM | POA: Diagnosis not present

## 2022-05-24 MED ORDER — IBUPROFEN 600 MG PO TABS: 600.0000 mg | ORAL_TABLET | Freq: Three times a day (TID) | ORAL | 1 refills | Status: AC | PRN

## 2022-05-24 NOTE — Patient Instructions (Signed)
Good to see you We'll call with the results from today  We have made a referral to physical therapy   Please send me a message in MyChart with any questions or updates.  Please see me back in 3 weeks.   --Dr. Jordan Likes

## 2022-05-24 NOTE — Assessment & Plan Note (Signed)
Acutely occurring.  Pain more present while testing external rotation. -Also done home exercise therapy and supportive care. -X-ray. -Referral to physical therapy. -Could consider injection

## 2022-05-24 NOTE — Assessment & Plan Note (Addendum)
Acute on chronic in nature.  Previous MRI in 2022 was demonstrating different areas of a source of nerve impingement.  Continues to have symptoms rating down the right leg that are more consistent with a nerve irritation. -Counseled on home exercise therapy and supportive care. -Ibuprofen. - referral to physical therapy  -Pursue epidural

## 2022-05-24 NOTE — Progress Notes (Signed)
  Anthony Russell - 42 y.o. male MRN [id number]  Date of birth: [birth year]  SUBJECTIVE:  Including CC & ROS.  No chief complaint on file.   Anthony Russell is a 42 y.o. male that is presenting with ongoing right leg pain.  The pain is still present with sitting and movement.  It is occurring in the lateral aspect of his right leg.  Continues to have bilateral shoulder pain.    Review of Systems See HPI   HISTORY: Past Medical, Surgical, Social, and Family History Reviewed & Updated per EMR.   Pertinent Historical Findings include:  Past Medical History:  Diagnosis Date   Allergy    Allergy, unspecified not elsewhere classified    rx w/ OTC antihistamines PRN   Anxiety    Asthma    Atypical chest pain    recent neg eval w/ normal cxr/ekg   Depression    Diverticulosis    DM (diabetes mellitus)    Dyspepsia    on protonix /d   Esophagitis    GERD (gastroesophageal reflux disease)    on protonix /d   HTN (hypertension)    Hyperlipidemia    on diet alone   IBS (irritable bowel syndrome)    Lumbar back pain    s/p lumbar laminectomy 2007 by DrCabbell   Migraines    Overweight(278.02)    weight Jan10=246#.[name].he was 225# in 9/07...diet and exercise was discussed   Sleep apnea    not wearing c-pap currently    Past Surgical History:  Procedure Laterality Date   DENTAL SURGERY     LUMBAR LAMINECTOMY  01/30/2005   DrCabbell   TEAR DUCT PROBING       PHYSICAL EXAM:  VS: BP 110/74 (BP Location: Left Arm, Patient Position: Sitting)   Ht  (1.778 m)   Wt 202 lb (91.6 kg)   BMI 28.98 kg/m  Physical Exam Gen: NAD, alert, cooperative with exam, well-appearing MSK:  Neurovascularly intact       ASSESSMENT & PLAN:   Lumbar radiculopathy Acute on chronic in nature.  Previous MRI in 2022 was demonstrating different areas of a source of nerve impingement.  Continues to have symptoms rating down the right leg that are more consistent with a nerve  irritation. -Counseled on home exercise therapy and supportive care. -Ibuprofen. - referral to physical therapy  -Pursue epidural  Capsulitis of left shoulder Acutely occurring.  Pain more present while testing external rotation. -Also done home exercise therapy and supportive care. -X-ray. -Referral to physical therapy. -Could consider injection   Rotator cuff tendinitis, right Anthony Russell.  Internal rotation demonstrates a popping sensation within the shoulder.  Has good range of motion. -Counseled on home exercise therapy and supportive care. -On x-ray. -Referral to physical therapy. - could consider nitro patches or injection.

## 2022-05-24 NOTE — Assessment & Plan Note (Signed)
Acutely occurring.  Internal rotation demonstrates a popping sensation within the shoulder.  Has good range of motion. -Counseled on home exercise therapy and supportive care. -On x-ray. -Referral to physical therapy. - could consider nitro patches or injection.

## 2022-05-26 ENCOUNTER — Telehealth: Payer: Self-pay | Admitting: Family Medicine

## 2022-05-26 NOTE — Telephone Encounter (Signed)
Informed of results.   Myra Rude, MD [facility] 05/26/2022, 8:34 AM

## 2022-06-04 NOTE — Therapy (Signed)
OUTPATIENT PHYSICAL THERAPY THORACOLUMBAR AND SHOULDER  EVALUATION   Patient Name: Anthony Russell MRN: [id number] DOB:1980-02-20, 42 y.o., male Today's Date: 06/05/2022  END OF SESSION:  PT End of Session - 06/05/22 0848     Visit Number 1    Date for PT Re-Evaluation 07/31/22    Authorization Type [employer]    PT Start Time 0848    PT Stop Time 0935    PT Time Calculation (min) 47 min    Activity Tolerance Patient tolerated treatment well    Behavior During Therapy WFL for tasks assessed/performed             Past Medical History:  Diagnosis Date   Allergy    Allergy, unspecified not elsewhere classified    rx w/ OTC antihistamines PRN   Anxiety    Asthma    Atypical chest pain    recent neg eval w/ normal cxr/ekg   Depression    Diverticulosis    DM (diabetes mellitus) (HCC)    Dyspepsia    on protonix 40mg /d   Esophagitis    GERD (gastroesophageal reflux disease)    on protonix 40mg /d   HTN (hypertension)    Hyperlipidemia    on diet alone   IBS (irritable bowel syndrome)    Lumbar back pain    s/p lumbar laminectomy 2007 by DrCabbell   Migraines    Overweight(278.02)    weight Jan10=246#.[name].he was 225# in 9/07...diet and exercise was discussed   Sleep apnea    not wearing c-pap currently   Past Surgical History:  Procedure Laterality Date   DENTAL SURGERY     LUMBAR LAMINECTOMY  01/30/2005   DrCabbell   TEAR DUCT PROBING     Patient Active Problem List   Diagnosis Date Noted   Lumbar radiculopathy 05/10/2022   Capsulitis of left shoulder 05/10/2022   Sinus tachycardia 12/28/2021   Impingement of knee joint, right 12/20/2021   Achilles tendinitis, right leg 12/20/2021   Rotator cuff tendinitis, right 11/04/2021   Encounter for screening for other metabolic disorders 04/07/2021   Dyspnea on exertion 04/07/2021   Migraines 02/23/2021   Lumbar back pain 02/23/2021   HTN (hypertension) 02/23/2021   Dyspepsia 02/23/2021   DM (diabetes mellitus) (HCC)  02/23/2021   Nasal septal deviation 02/17/2021   Laryngitis 01/19/2021   Asymmetric tonsils 12/01/2020   Subacromial bursitis of right shoulder joint 05/17/2020   Hypersomnia 11/03/2015   Obesity 11/03/2015   Allergic rhinitis 04/15/2014   Asthma in adult 04/15/2014   GERD (gastroesophageal reflux disease) 04/15/2014   IBS (irritable bowel syndrome) 04/01/2013   Nausea alone 01/15/2013   Diarrhea 01/15/2013   Depression 10/15/2012   Lesion of eyebrow 02/09/2012   Gastroparesis 07/05/2011   Anxiety 07/05/2010   TESTOSTERONE DEFICIENCY 05/26/2008   Hyperlipidemia 02/06/2008   Facet arthropathy, lumbar 02/06/2008   Atypical chest pain 02/06/2008    PCP: Esperanza Richters, PA-C   REFERRING PROVIDER: Myra Rude, MD   REFERRING DIAG:  54.16 (ICD-10-CM) - Lumbar radiculopathy  M75.81 (ICD-10-CM) - Rotator cuff tendinitis, right  M77.8 (ICD-10-CM) - Capsulitis of left shoulder    Rationale for Evaluation and Treatment: Rehabilitation  THERAPY DIAG:  Radiculopathy, lumbar region  Acute pain of both shoulders  Cramp and spasm  Abnormal posture  ONSET DATE: R leg pain several years; Shoulders - months  SUBJECTIVE:  SUBJECTIVE STATEMENT: The leg gets bad with walking or moving. He has been doing exercise to lose weight and it has gotten worse. The shoulders hurt when I'm using them a lot. Pain with R horizontal ABD when petting his cat. Works at his computer all day. Has decreased patellar reflex on L. Limited to 6 min walking with leg.  PERTINENT HISTORY:  DM, Lumbar laminectomy L 2007  PAIN:  Are you having pain? Yes: NPRS scale: 4 up to 6-7/10 Pain location: R leg around distal thigh knee, sometimes in ankle  Pain description: tingling and sharp Aggravating factors: reaching out  in passenger seat hurts R leg, putting jeans on Relieving factors: rest  Are you having pain? Yes: NPRS scale: 2-3/10 Pain location: deltoid region Pain description: throbbing, intermittent Aggravating factors: horizontal ABD bil Relieving factors: rest  PRECAUTIONS: None  WEIGHT BEARING RESTRICTIONS: No  FALLS:  Has patient fallen in last 6 months? No  LIVING ENVIRONMENT: Lives with: lives alone Lives in: House/apartment Stairs: Yes: External: 2 flights steps; can reach both Has following equipment at home: None  OCCUPATION: Sport and exercise psychologist, works at home; has sit down and stand up desk  PLOF: Independent  PATIENT GOALS: get regular movement back in back, be able to exercise without pain and pet my cats  NEXT MD VISIT: 06/19/22  OBJECTIVE:   DIAGNOSTIC FINDINGS:  XR Left - negative XR Right - Mild glenohumeral degenerative changes without loss of joint space.  [place] Right - thickening of the coracohumeral ligament and the deltoid ligament   PATIENT SURVEYS:  Modified Oswestry 8 / 50 = 16.0 %  Quick Dash 27.3 / 100 = 27.3 % FOTO TBD  SCREENING FOR RED FLAGS: Neg  COGNITION: Overall cognitive status: Within functional limits for tasks assessed     SENSATION: WFL  MUSCLE LENGTH: Marked tightness in bil HS, quads, hip flexors, hip rotators and lumbar  POSTURE: rounded shoulders, forward head, and tight QL with depressed R scapula; left lateral shift  PALPATION: R gluteus medius and lumbar R>L  LUMBAR ROM:   AROM eval  Flexion Limited by HS to knees  Extension 75% limited  Right lateral flexion 50% limited  Left lateral flexion 50% limited  Right rotation 25% limited  Left rotation 25% limited   (Blank rows = not tested)  LOWER EXTREMITY ROM:   marked flexibility deficits affecting full range of bil hip and knee joints  LOWER EXTREMITY MMT:  Grossly 5/5 in  B LE   LUMBAR SPECIAL TESTS:  Straight leg raise test: Positive and Slump test:  Positive  SHOULDER SPECIAL TESTS: Impingement tests: Neer impingement test: negative and Hawkins/Kennedy impingement test: positive  R  Rotator cuff assessment: neg bil  UPPER EXTREMITY ROM: Functional bil shoulder ROM  Active ROM Right eval Left eval  Shoulder flexion    Shoulder extension    Shoulder abduction    Shoulder adduction    Shoulder extension    Shoulder internal rotation    Shoulder external rotation    Elbow flexion    Elbow extension    Wrist flexion    Wrist extension    Wrist ulnar deviation    Wrist radial deviation    Wrist pronation    Wrist supination     (Blank rows = not tested)  UPPER EXTREMITY MMT: Grossly 5/5 bil except R  ER 4+5 with mild pain   TODAY'S TREATMENT:  DATE:   06/05/22  See pt ed and HEP   PATIENT EDUCATION:  Education details: PT eval findings, anticipated POC, initial HEP, and postural awareness' Person educated: Patient Education method: Explanation, Demonstration, and Handouts Education comprehension: verbalized understanding and returned demonstration  HOME EXERCISE PROGRAM: Access Code: [email or web address] URL: [email or web address] Date: 06/05/2022 Prepared by: Raynelle Fanning  Exercises - Supine Lower Trunk Rotation  - 2 x daily - 7 x weekly - 1 sets - 5 reps - 10 sec hold - Cat Cow to Child's Pose  - 1 x daily - 7 x weekly - 1-3 sets - 10 reps - Seated Scapular Retraction  - 1 x daily - 7 x weekly - 1-3 sets - 10 reps - 2-3 sec hold - Doorway Pec Stretch at 90 Degrees Abduction  - 1 x daily - 7 x weekly - 1 sets - 3 reps - 30-60 seconds hold  ASSESSMENT:  CLINICAL IMPRESSION: BROOKES PICK is a 42 y.o. male who was seen today for physical therapy evaluation and treatment for R lumbar radiculopathy and bil shoulder pain. 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 He has marked deficits in flexibility of his HS, hip flexors, quads, hip rotators and lumbar musculature. He demonstrates a left lateral shift and tight QL, rounded shoulders and forward head. He has pain and stiffness with lumbar PA mobs. He has recently lost 30 pounds and continues to work on his diet, but his exercise is inconsistent. Encouraged him to be consistent with a walking program but his radicular pain limits him to 6 min. His strength in upper and lower extremities is good. He does have mild pain in the R shoulder with resisted ER. His shoulders are painful mainly with horizontal ABD and ER and he notices the pain a lot when petting his cats. [place] will benefit from skilled PT to address these deficits.    OBJECTIVE IMPAIRMENTS: decreased activity tolerance, decreased mobility, decreased ROM, decreased strength, increased muscle spasms, impaired flexibility, impaired UE functional use, postural dysfunction, and pain.   ACTIVITY LIMITATIONS: standing and locomotion level  PARTICIPATION LIMITATIONS:  walking for exercise  PERSONAL FACTORS: Fitness, Profession, Time since onset of injury/illness/exacerbation, and 1-2 comorbidities: DM, previous back surgery  are also affecting patient's functional outcome.   REHAB POTENTIAL: Excellent  CLINICAL DECISION MAKING: Evolving/moderate complexity  EVALUATION COMPLEXITY: Moderate   GOALS: Goals reviewed with patient? Yes  SHORT TERM GOALS: Target date: 07/03/2022   Patient will be independent with initial HEP.  Baseline:  Goal status: INITIAL  2.  Patient will report ability to centralize radicular symptoms with therex.  Baseline:  Goal status: INITIAL  3.  Complete FOTO for back by 2nd visit Baseline:  Goal status: INITIAL  LONG TERM GOALS: Target date: 07/31/2022   Patient will be independent with advanced/ongoing HEP to improve  outcomes and carryover.  Baseline:  Goal status: INITIAL  2.  Patient will report 75% improvement in low back radicular pain to improve QOL.  Baseline:  Goal status: INITIAL  3.  Patient will demonstrate full/functional pain free lumbar ROM to perform ADLs.   Baseline:  Goal status: INITIAL  4.  Patient will report 2/50 on Modified Oswestry to demonstrate improved functional ability.  Baseline: 8 / 50 = 16.0 % Goal status: INITIAL   5.  Patient will tolerate 30 min of walking without increased  pain. Baseline:  Goal status: INITIAL  6.  Patient to demonstrate ability to achieve and maintain good spinal alignment/posturing and body mechanics needed for daily activities. Baseline:  Goal status: INITIAL  7.  Patient will report 75% improvement in bil shoulder pain with petting cats and reaching outward with ADLS. Baseline:  Goal status: INITIAL  8  Patient will report 17 on Quick Dash to demonstrate improved functional ability.  Baseline: 27.3 / 100 = 27.3 % Goal status: INITIAL  PLAN:  PT FREQUENCY: 2x/week  PT DURATION: 8 weeks  PLANNED INTERVENTIONS: Therapeutic exercises, Therapeutic activity, Neuromuscular re-education, Patient/Family education, Self Care, Joint mobilization, Aquatic Therapy, Dry Needling, Electrical stimulation, Spinal manipulation, Spinal mobilization, Cryotherapy, Moist heat, Taping, Traction, Ultrasound, Ionotophoresis 4mg /ml Dexamethasone, and Manual therapy.  PLAN FOR NEXT SESSION: DO FOTO, correct lateral shift with lateral glide on wall, focus on flexiblity of lumbar, hips, knees, pecs. Postural strengthening.    Anahla Bevis, PT 06/05/2022, 3:23 PM

## 2022-06-05 ENCOUNTER — Encounter: Payer: Self-pay | Admitting: Physical Therapy

## 2022-06-05 ENCOUNTER — Ambulatory Visit: Payer: BC Managed Care – PPO | Attending: Family Medicine | Admitting: Physical Therapy

## 2022-06-05 ENCOUNTER — Other Ambulatory Visit: Payer: Self-pay

## 2022-06-05 DIAGNOSIS — M25511 Pain in right shoulder: Secondary | ICD-10-CM | POA: Diagnosis not present

## 2022-06-05 DIAGNOSIS — R252 Cramp and spasm: Secondary | ICD-10-CM | POA: Diagnosis not present

## 2022-06-05 DIAGNOSIS — R293 Abnormal posture: Secondary | ICD-10-CM | POA: Diagnosis not present

## 2022-06-05 DIAGNOSIS — M7581 Other shoulder lesions, right shoulder: Secondary | ICD-10-CM | POA: Diagnosis not present

## 2022-06-05 DIAGNOSIS — M25512 Pain in left shoulder: Secondary | ICD-10-CM | POA: Diagnosis not present

## 2022-06-05 DIAGNOSIS — M778 Other enthesopathies, not elsewhere classified: Secondary | ICD-10-CM | POA: Diagnosis not present

## 2022-06-05 DIAGNOSIS — M5416 Radiculopathy, lumbar region: Secondary | ICD-10-CM | POA: Diagnosis not present

## 2022-06-06 ENCOUNTER — Ambulatory Visit
Admission: RE | Admit: 2022-06-06 | Discharge: 2022-06-06 | Disposition: A | Discharge status: Home / Self Care | Payer: BC Managed Care – PPO | Source: Ambulatory Visit | Attending: Family Medicine | Admitting: Family Medicine

## 2022-06-06 ENCOUNTER — Other Ambulatory Visit: Payer: Self-pay | Admitting: Family Medicine

## 2022-06-06 DIAGNOSIS — M7581 Other shoulder lesions, right shoulder: Secondary | ICD-10-CM

## 2022-06-06 DIAGNOSIS — M4727 Other spondylosis with radiculopathy, lumbosacral region: Secondary | ICD-10-CM | POA: Diagnosis not present

## 2022-06-06 DIAGNOSIS — M79604 Pain in right leg: Secondary | ICD-10-CM | POA: Diagnosis not present

## 2022-06-06 DIAGNOSIS — M5416 Radiculopathy, lumbar region: Secondary | ICD-10-CM

## 2022-06-06 DIAGNOSIS — Z20822 Contact with and (suspected) exposure to covid-19: Secondary | ICD-10-CM

## 2022-06-06 DIAGNOSIS — M778 Other enthesopathies, not elsewhere classified: Secondary | ICD-10-CM

## 2022-06-06 MED ORDER — IOPAMIDOL (ISOVUE-M 200) INJECTION 41%
1.0000 mL | Freq: Once | INTRAMUSCULAR | Status: AC
  Administered 2022-06-06: 1 mL via EPIDURAL

## 2022-06-06 MED ORDER — METHYLPREDNISOLONE ACETATE 40 MG/ML INJ SUSP (RADIOLOG
80.0000 mg | Freq: Once | INTRAMUSCULAR | Status: AC
  Administered 2022-06-06: 80 mg via EPIDURAL

## 2022-06-06 NOTE — Discharge Instructions (Signed)
Post Procedure Spinal Discharge Instruction Sheet .  . You may resume a regular diet and any medications that you routinely take (including pain medications). .  . No driving day of procedure. .  . Light activity throughout the rest of the day.  Do not do any strenuous work, exercise, bending or lifting.  The day following the procedure, you can resume normal physical activity but you should refrain from exercising or physical therapy for at least three days thereafter. .  .  . Common Side Effects: .  . Headaches- take your usual medications as directed by your physician.  Increase your fluid intake.  Caffeinated beverages may be helpful.  Lie flat in bed until your headache resolves. .  . Restlessness or inability to sleep- you may have trouble sleeping for the next few days.  Ask your referring physician if you need any medication for sleep. .  . Facial flushing or redness- should subside within a few days. .  . Increased pain- a temporary increase in pain a day or two following your procedure is not unusual.  Take your pain medication as prescribed by your referring physician. .  . Leg cramps .  . Please contact our office at [phone] for the following symptoms: . Fever greater than 100 degrees. . Headaches unresolved with medication after 2-3 days.  Increased swelling, pain, or redness at injection site. 

## 2022-06-07 ENCOUNTER — Ambulatory Visit: Payer: BC Managed Care – PPO

## 2022-06-07 ENCOUNTER — Encounter: Payer: Self-pay | Admitting: Pulmonary Disease

## 2022-06-07 ENCOUNTER — Ambulatory Visit (INDEPENDENT_AMBULATORY_CARE_PROVIDER_SITE_OTHER): Payer: BC Managed Care – PPO | Admitting: Pulmonary Disease

## 2022-06-07 VITALS — BP 100/74 | HR 107 | Ht 70.0 in | Wt 201.4 lb

## 2022-06-07 DIAGNOSIS — J4520 Mild intermittent asthma, uncomplicated: Secondary | ICD-10-CM | POA: Diagnosis not present

## 2022-06-07 MED ORDER — BUDESONIDE-FORMOTEROL FUMARATE 160-4.5 MCG/ACT IN AERO: 2.0000 | INHALATION_SPRAY | Freq: Two times a day (BID) | RESPIRATORY_TRACT | 12 refills | Status: DC

## 2022-06-07 NOTE — Progress Notes (Signed)
@Patient  ID: Anthony Russell, male    DOB: 1980/12/26, 42 y.o.   MRN: [id number]  Chief Complaint  Patient presents with   Follow-up    Breathing has been ok  ACT:23    Referring provider: Marisue Brooklyn  HPI:   42 y.o. whom we are seeing in follow-up for asthma.  Most recent PCP note x 3 reviewed.   Breathing doing well.  He has lost 34 pounds.  I think this really has helped his breathing.  Likely decreased non-Th2 inflammation associated with fat cells.  This probably helped his breathing.  Trelegy seem to make things worse.  Has not been using inhaler in some time.  Did contract COVID recently.  Felt ill for a while but breathing seemed to do okay.  HPI at initial visit: No real change in symptoms since last visit.  Had EGD.  This showed gastritis.  Change of Symbicort to Advair without improvement in symptoms.  Chief complaint is abdominal discomfort.  Has some heaviness or tightness in his chest when he walks sometimes.  He has a history of asthma.  He has not improved with Advair.  Trial of prednisone 20 mg daily for 5 days and this did not improve symptoms either.  Discussed at length given description of symptoms, tenderness to palpation on exam, that this is unlikely to represent an abnormality in the lung.  Suspect this is related to GI tract.  HPI at initial visit: Notes onset of upper belly pain, low chest pain several months ago.  Seen by GI.  Increasing GERD medications without improvement.  CT abdomen pelvis ordered which was most notable for esophagitis.  Tried sulcal fate without improvement but did increase his sugars.  Unfortunate, developed epiglottitis and upper airway infection 12/2020.  Got dose dexamethasone.  Improved swelling.  Antibiotics prescribed also help.  Since then he describes a chest pressure discomfort.  Points, substernal.  Worse when he lies supine.  No exertional component.  No dyspnea.  Little bit worse when sitting up compared to standing up.   No other alleviating or exacerbating factors, positional changes, time of day, seasonal or environmental factors he can apply to make things better or worse.  He continues on high-dose Symbicort.  This does not help at all.  Seems to make things worse.  Albuterol provides some mild relief over the course of a couple of hours.  Review chest x-ray 02/11/2021 that on my review interpretation reveals clear lungs bilaterally.  CTA PE protocol from same date reviewed which shows clear lungs, no abnormality on my review and interpretation.  PMH: Hypertension, asthma, diabetes, hyperlipidemia, headaches, sleep apnea Surgical history: Neck surgery 2007  family history: Allergies, CAD in first relatives Social history: Former smoker, quit 2014, 1 pack year history, lives in [place] / Pulmonary Flowsheets:   ACT:  Asthma Control Test ACT Total Score  06/07/2022  1:46 PM 23    MMRC:     No data to display          Epworth:     11/03/2015    3:00 PM  Results of the Epworth flowsheet  Sitting and reading 2  Watching TV 2  Sitting, inactive in a public place (e.g. a theatre or a meeting) 2  As a passenger in a car for an hour without a break 2  Lying down to rest in the afternoon when circumstances permit 3  Sitting and talking to someone 1  Sitting quietly  after a lunch without alcohol 2  In a car, while stopped for a few minutes in traffic 2  Total score 16    Tests:   FENO:  No results found for: "NITRICOXIDE"  PFT:    Latest Ref Rng & Units 06/08/2021   11:48 AM  PFT Results  FVC-Pre L 4.33   FVC-Predicted Pre % 84   FVC-Post L 3.82   FVC-Predicted Post % 74   Pre FEV1/FVC % % 66   Post FEV1/FCV % % 73   FEV1-Pre L 2.85   FEV1-Predicted Pre % 69   FEV1-Post L 2.79   DLCO uncorrected ml/min/mmHg 37.34   DLCO UNC% % 123   DLCO corrected ml/min/mmHg 37.34   DLCO COR %Predicted % 123   DLVA Predicted % 141   TLC L 5.88   TLC % Predicted % 87   RV %  Predicted % 95    WALK:      No data to display          Imaging: Personally reviewed and as per EMR discussion this note DG Epidural/Nerve Root  Result Date: 06/06/2022 CLINICAL DATA:  Lumbosacral spondylosis without myelopathy with radiculopathy. Anteromedial right leg pain. Widespread fusion across the posterior elements in the lumbar spine limiting potential interlaminar access. Right foraminal to extraforaminal disc protrusion at L3-4. EXAM: EPIDURAL/NERVE ROOT FLUOROSCOPY: Radiation Exposure Index (as provided by the fluoroscopic device): 3.90 mGy Kerma PROCEDURE: The procedure, risks, benefits, and alternatives were explained to the patient. Questions regarding the procedure were encouraged and answered. The patient understands and consents to the procedure. RIGHT L3 NERVE ROOT BLOCK AND TRANSFORAMINAL EPIDURAL: A posterior oblique approach was taken to the intervertebral foramen on the right at L3-4 using a curved 5 inch 22 gauge spinal needle. Injection of Isovue-M 200 outlined the right L3 nerve root and showed good epidural spread. No vascular opacification is seen. 80 mg of Depo-Medrol mixed with 2 mL of 1% lidocaine were instilled. The procedure was well-tolerated, and the patient was discharged thirty minutes following the injection in good condition. COMPLICATIONS: None IMPRESSION: Technically successful injection consisting of a right L3 nerve root block and transforaminal epidural. Electronically Signed   By: Sebastian Ache M.D.   On: 06/06/2022 12:35   DG Shoulder Right  Result Date: 05/25/2022 CLINICAL DATA:  Pain EXAM: RIGHT SHOULDER - 2+ VIEW COMPARISON:  None Available. FINDINGS: Mild glenohumeral degenerative changes without loss of joint space. No fracture dislocation. No other bony or soft tissue abnormalities. IMPRESSION: Mild glenohumeral degenerative changes without loss of joint space. Electronically Signed   By: Gerome Sam III M.D.   On: 05/25/2022 17:59   DG  Shoulder Left  Result Date: 05/25/2022 CLINICAL DATA:  Pain EXAM: LEFT SHOULDER - 2+ VIEW COMPARISON:  None Available. FINDINGS: Stable bone islands in the acromion and humeral head compared to the December 26, 2021 chest x-ray. No fracture or dislocation. No other bony or soft tissue abnormalities identified. IMPRESSION: No cause for pain identified. Electronically Signed   By: Gerome Sam III M.D.   On: 05/25/2022 17:58    Lab Results: Personally reviewed, no anemia, no significant elevation in eosinophils CBC    Component Value Date/Time   WBC 5.6 05/10/2022 0918   WBC 9.1 12/26/2021 1515   RBC 5.36 05/10/2022 0918   RBC 5.09 12/26/2021 1515   HGB 15.6 05/10/2022 0918   HCT 47.7 05/10/2022 0918   PLT 185 05/10/2022 0918   MCV 89 05/10/2022 0918  MCH 29.1 05/10/2022 0918   MCH 29.7 12/26/2021 1515   MCHC 32.7 05/10/2022 0918   MCHC 33.4 12/26/2021 1515   RDW 14.0 05/10/2022 0918   LYMPHSABS 2.2 12/26/2021 1515   MONOABS 0.7 12/26/2021 1515   EOSABS 0.1 12/26/2021 1515   BASOSABS 0.1 12/26/2021 1515    BMET    Component Value Date/Time   NA 142 05/02/2022 0713   NA 144 03/10/2021 0910   K 4.4 05/02/2022 0713   CL 103 05/02/2022 0713   CO2 32 05/02/2022 0713   GLUCOSE 81 05/02/2022 0713   BUN 14 05/02/2022 0713   BUN 15 03/10/2021 0910   CREATININE 0.89 05/02/2022 0713   CALCIUM 9.7 05/02/2022 0713   GFRNONAA >60 12/26/2021 1515   GFRAA >60 03/29/2018 1706    BNP No results found for: "BNP"  ProBNP No results found for: "PROBNP"  Specialty Problems       Pulmonary Problems   Allergic rhinitis   Asthma in adult   Asymmetric tonsils    Last Assessment & Plan:  Formatting of this note might be different from the original. Concern over tonsil asymmetry. Noted on recent imaging of the head.  Denies any symptoms.  Smoked in the distant past. EXAM shows relatively symmetric appearing tonsils.  Both are soft on palpation.  No adenopathy in the neck. PLAN:  Reassured all looks okay.  No further treatment or evaluation necessary.      Laryngitis   Nasal septal deviation   Dyspnea on exertion    No Known Allergies  Immunization History  Administered Date(s) Administered   DTP 11/12/1980, 01/18/1981   Hepatitis B 10/12/1995, 11/15/1995, 05/02/1996   Influenza,inj,Quad PF,6+ Mos 11/16/2020   MMR 12/03/1981, 11/15/1995   PFIZER(Purple Top)SARS-COV-2 Vaccination 04/26/2019, 05/18/2019, 01/01/2020   Pneumococcal Polysaccharide-23 04/05/2020   Tdap 08/13/2012    Past Medical History:  Diagnosis Date   Allergy    Allergy, unspecified not elsewhere classified    rx w/ OTC antihistamines PRN   Anxiety    Asthma    Atypical chest pain    recent neg eval w/ normal cxr/ekg   Depression    Diverticulosis    DM (diabetes mellitus) (HCC)    Dyspepsia    on protonix 40mg /d   Esophagitis    GERD (gastroesophageal reflux disease)    on protonix 40mg /d   HTN (hypertension)    Hyperlipidemia    on diet alone   IBS (irritable bowel syndrome)    Lumbar back pain    s/p lumbar laminectomy 2007 by DrCabbell   Migraines    Overweight(278.02)    weight Jan10=246#.[name].he was 225# in 9/07...diet and exercise was discussed   Sleep apnea    not wearing c-pap currently    Tobacco History: Social History   Tobacco Use  Smoking Status Former   Packs/day: 0.10   Years: 8.00   Additional pack years: 0.00   Total pack years: 0.80   Types: Cigarettes   Quit date: 03/03/2011   Years since quitting: 11.2  Smokeless Tobacco Never   Counseling given: Not Answered   Continue to not smoke  Outpatient Encounter Medications as of 06/07/2022  Medication Sig   albuterol (VENTOLIN HFA) 108 (90 Base) MCG/ACT inhaler INHALE 2 PUFFS INTO THE LUNGS EVERY 6 HOURS AS NEEDED FOR WHEEZING OR SHORTNESS OF BREATH (Patient taking differently: Inhale 2 puffs into the lungs every 6 (six) hours as needed for wheezing or shortness of breath.)   atorvastatin (LIPITOR)  10 MG  tablet TAKE 1 TABLET(10 MG) BY MOUTH DAILY (Patient taking differently: Take 10 mg by mouth 3 (three) times a week.)   azelastine (ASTELIN) 0.1 % nasal spray Place 1 spray into both nostrils 2 (two) times daily. Use in each nostril as directed   baclofen (LIORESAL) 10 MG tablet Take 1 tablet (10 mg total) by mouth 2 (two) times daily.   benzonatate (TESSALON) 100 MG capsule Take 1 capsule (100 mg total) by mouth 3 (three) times daily as needed for cough.   budesonide-formoterol (SYMBICORT) 160-4.5 MCG/ACT inhaler Inhale 2 puffs into the lungs in the morning and at bedtime.   calcium carbonate (OS-CAL) 1250 (500 Ca) MG chewable tablet Chew 2 tablets by mouth daily.   Continuous Blood Gluc Sensor (FREESTYLE LIBRE 3 SENSOR) MISC USE TO CHECK BLOOD GLUCOSE CONTINOUSLY. CHANGE EVERY 14 DAYS   dicyclomine (BENTYL) 10 MG capsule Take 1 capsule (10 mg total) by mouth 3 (three) times daily as needed for spasms (abdominal pain).   famotidine (PEPCID) 20 MG tablet Take 1 tablet (20 mg total) by mouth at bedtime.   fluticasone (FLONASE) 50 MCG/ACT nasal spray Place 2 sprays into both nostrils daily.   glycopyrrolate (ROBINUL) 2 MG tablet TAKE 1 TABLET BY MOUTH TWICE DAILY AS NEEDED FOR ABDOMINAL DISCOMFORT (Patient taking differently: Take 1 mg by mouth 2 (two) times daily as needed (abdominal discomfort).)   ibuprofen (ADVIL) 600 MG tablet Take 1 tablet (600 mg total) by mouth every 8 (eight) hours as needed.   levocetirizine (XYZAL) 5 MG tablet Take 1 tablet (5 mg total) by mouth every evening.   metFORMIN (GLUCOPHAGE) 1000 MG tablet TAKE 1 TABLET(1000 MG) BY MOUTH TWICE DAILY WITH A MEAL   Multiple Vitamin (MULTIVITAMIN ADULT PO) Take 2 tablets by mouth daily.   Nutritional Supplements (PYCNOGENOL) 300-30 MG CAPS Take 1 capsule by mouth 3 (three) times daily.   pantoprazole (PROTONIX) 40 MG tablet TAKE 1 TABLET(40 MG) BY MOUTH TWICE DAILY BEFORE A MEAL   propranolol (INDERAL) 20 MG tablet Take 1 tablet  (20 mg total) by mouth 2 (two) times daily.   [DISCONTINUED] Fluticasone-Umeclidin-Vilant (TRELEGY ELLIPTA) 200-62.5-25 MCG/ACT AEPB Inhale 1 puff into the lungs daily. (Patient not taking: Reported on 06/07/2022)   No facility-administered encounter medications on file as of 06/07/2022.     Review of Systems  Review of Systems  N/a Physical Exam  BP 100/74 (BP Location: Left Arm, Patient Position: Sitting, Cuff Size: Normal)   Pulse (!) 107   Ht 5\' 10"  (1.778 m)   Wt 201 lb 6.4 oz (91.4 kg)   SpO2 96%   BMI 28.90 kg/m   Wt Readings from Last 5 Encounters:  06/07/22 201 lb 6.4 oz (91.4 kg)  05/24/22 202 lb (91.6 kg)  05/10/22 223 lb (101.2 kg)  04/25/22 223 lb (101.2 kg)  01/03/22 222 lb 12.8 oz (101.1 kg)    BMI Readings from Last 5 Encounters:  06/07/22 28.90 kg/m  05/24/22 28.98 kg/m  05/10/22 32.00 kg/m  04/25/22 32.00 kg/m  01/03/22 31.97 kg/m     Physical Exam General: Sitting in chair, no acute distress Eyes: EOMI, icterus Neck: Supple, no JVP Pulmonary: Clear, no work of breathing, good air movement Cardiovascular: Regular rhythm, no murmur Abdomen: Tender to palpation epigastric area MSK: No synovitis, joint effusion Neuro: Normal gait, no weakness Psych: Normal mood, flat affect   Assessment & Plan:   Dyspnea exertion: Worried about reactivation of asthma, below.  Normal D-dimer in the ED, low likelihood,  essentially rules out possibility of PE.  Seems improved with weight loss  Asthma: Seems well-controlled.  Likely attributable to weight loss.  Trelegy seem to make things worse, almost like bronchospasm.  Removed from medication list.  Prescribed Symbicort to have on hand in case he has seasonal issues with wheezing cough etc. or with URIs.  Return in about 1 year (around 06/07/2023).   Karren Burly, MD 06/07/2022

## 2022-06-07 NOTE — Patient Instructions (Addendum)
Nice to see you again  I am glad you are doing well  Congratulations on the weight loss, I am sure this is helping for asthma and breathing  I will send in a new prescription for an inhaler in case you breathing worsens with pollens changes and season  This is called Symbicort, use 2 puffs twice a day, rinse your mouth out with water after every use  Return to clinic in 1 year or sooner if needed with Dr. Judeth Horn

## 2022-06-13 ENCOUNTER — Ambulatory Visit: Payer: BC Managed Care – PPO

## 2022-06-19 ENCOUNTER — Other Ambulatory Visit: Payer: Self-pay | Admitting: Medical

## 2022-06-19 ENCOUNTER — Ambulatory Visit: Payer: BC Managed Care – PPO | Admitting: Family Medicine

## 2022-06-21 ENCOUNTER — Ambulatory Visit: Payer: BC Managed Care – PPO

## 2022-06-21 DIAGNOSIS — M7581 Other shoulder lesions, right shoulder: Secondary | ICD-10-CM | POA: Diagnosis not present

## 2022-06-21 DIAGNOSIS — M5416 Radiculopathy, lumbar region: Secondary | ICD-10-CM | POA: Diagnosis not present

## 2022-06-21 DIAGNOSIS — M25512 Pain in left shoulder: Secondary | ICD-10-CM | POA: Diagnosis not present

## 2022-06-21 DIAGNOSIS — M25511 Pain in right shoulder: Secondary | ICD-10-CM

## 2022-06-21 DIAGNOSIS — M778 Other enthesopathies, not elsewhere classified: Secondary | ICD-10-CM | POA: Diagnosis not present

## 2022-06-21 DIAGNOSIS — R293 Abnormal posture: Secondary | ICD-10-CM

## 2022-06-21 DIAGNOSIS — R252 Cramp and spasm: Secondary | ICD-10-CM

## 2022-06-21 NOTE — Therapy (Signed)
OUTPATIENT PHYSICAL THERAPY THORACOLUMBAR AND SHOULDER TREATMENT   Patient Name: Anthony Russell MRN: [id number] DOB:1980-04-05, 42 y.o., male Today's Date: 06/21/2022  END OF SESSION:  PT End of Session - 06/21/22 1133     Visit Number 2    Date for PT Re-Evaluation 07/31/22    Authorization Type BCBS    PT Start Time 1106    PT Stop Time 1148    PT Time Calculation (min) 42 min    Activity Tolerance Patient tolerated treatment well    Behavior During Therapy WFL for tasks assessed/performed              Past Medical History:  Diagnosis Date   Allergy    Allergy, unspecified not elsewhere classified    rx w/ OTC antihistamines PRN   Anxiety    Asthma    Atypical chest pain    recent neg eval w/ normal cxr/ekg   Depression    Diverticulosis    DM (diabetes mellitus) (HCC)    Dyspepsia    on protonix 40mg /d   Esophagitis    GERD (gastroesophageal reflux disease)    on protonix 40mg /d   HTN (hypertension)    Hyperlipidemia    on diet alone   IBS (irritable bowel syndrome)    Lumbar back pain    s/p lumbar laminectomy 2007 by DrCabbell   Migraines    Overweight(278.02)    weight Jan10=246#.[name].he was 225# in 9/07...diet and exercise was discussed   Sleep apnea    not wearing c-pap currently   Past Surgical History:  Procedure Laterality Date   DENTAL SURGERY     LUMBAR LAMINECTOMY  01/30/2005   DrCabbell   TEAR DUCT PROBING     Patient Active Problem List   Diagnosis Date Noted   Lumbar radiculopathy 05/10/2022   Capsulitis of left shoulder 05/10/2022   Sinus tachycardia 12/28/2021   Impingement of knee joint, right 12/20/2021   Achilles tendinitis, right leg 12/20/2021   Rotator cuff tendinitis, right 11/04/2021   Encounter for screening for other metabolic disorders 04/07/2021   Dyspnea on exertion 04/07/2021   Migraines 02/23/2021   Lumbar back pain 02/23/2021   HTN (hypertension) 02/23/2021   Dyspepsia 02/23/2021   DM (diabetes mellitus) (HCC)  02/23/2021   Nasal septal deviation 02/17/2021   Laryngitis 01/19/2021   Asymmetric tonsils 12/01/2020   Subacromial bursitis of right shoulder joint 05/17/2020   Hypersomnia 11/03/2015   Obesity 11/03/2015   Allergic rhinitis 04/15/2014   Asthma in adult 04/15/2014   GERD (gastroesophageal reflux disease) 04/15/2014   IBS (irritable bowel syndrome) 04/01/2013   Nausea alone 01/15/2013   Diarrhea 01/15/2013   Depression 10/15/2012   Lesion of eyebrow 02/09/2012   Gastroparesis 07/05/2011   Anxiety 07/05/2010   TESTOSTERONE DEFICIENCY 05/26/2008   Hyperlipidemia 02/06/2008   Facet arthropathy, lumbar 02/06/2008   Atypical chest pain 02/06/2008    PCP: Esperanza Richters, PA-C   REFERRING PROVIDER: Myra Rude, MD   REFERRING DIAG:  54.16 (ICD-10-CM) - Lumbar radiculopathy  M75.81 (ICD-10-CM) - Rotator cuff tendinitis, right  M77.8 (ICD-10-CM) - Capsulitis of left shoulder    Rationale for Evaluation and Treatment: Rehabilitation  THERAPY DIAG:  Radiculopathy, lumbar region  Acute pain of both shoulders  Cramp and spasm  Abnormal posture  ONSET DATE: R leg pain several years; Shoulders - months  SUBJECTIVE:  SUBJECTIVE STATEMENT: Pt denies pain but that he gets stiffness every now and then. His shoulders seem to bother him more when reaching out to pet his cat or when reaching out in front.  PERTINENT HISTORY:  DM, Lumbar laminectomy L 2007  PAIN:  Are you having pain? No  Are you having pain? Yes: NPRS scale: 0/10 Pain location: deltoid region Pain description: throbbing, intermittent Aggravating factors: horizontal ABD bil Relieving factors: rest  PRECAUTIONS: None  WEIGHT BEARING RESTRICTIONS: No  FALLS:  Has patient fallen in last 6 months? No  LIVING  ENVIRONMENT: Lives with: lives alone Lives in: House/apartment Stairs: Yes: External: 2 flights steps; can reach both Has following equipment at home: None  OCCUPATION: Sport and exercise psychologist, works at home; has sit down and stand up desk  PLOF: Independent  PATIENT GOALS: get regular movement back in back, be able to exercise without pain and pet my cats  NEXT MD VISIT: 06/19/22  OBJECTIVE:   DIAGNOSTIC FINDINGS:  XR Left - negative XR Right - Mild glenohumeral degenerative changes without loss of joint space.  [place] Right - thickening of the coracohumeral ligament and the deltoid ligament   PATIENT SURVEYS:  Modified Oswestry 8 / 50 = 16.0 %  Quick Dash 27.3 / 100 = 27.3 % FOTO TBD  SCREENING FOR RED FLAGS: Neg  COGNITION: Overall cognitive status: Within functional limits for tasks assessed     SENSATION: WFL  MUSCLE LENGTH: Marked tightness in bil HS, quads, hip flexors, hip rotators and lumbar  POSTURE: rounded shoulders, forward head, and tight QL with depressed R scapula; left lateral shift  PALPATION: R gluteus medius and lumbar R>L  LUMBAR ROM:   AROM eval  Flexion Limited by HS to knees  Extension 75% limited  Right lateral flexion 50% limited  Left lateral flexion 50% limited  Right rotation 25% limited  Left rotation 25% limited   (Blank rows = not tested)  LOWER EXTREMITY ROM:   marked flexibility deficits affecting full range of bil hip and knee joints  LOWER EXTREMITY MMT:  Grossly 5/5 in  B LE   LUMBAR SPECIAL TESTS:  Straight leg raise test: Positive and Slump test: Positive  SHOULDER SPECIAL TESTS: Impingement tests: Neer impingement test: negative and Hawkins/Kennedy impingement test: positive  R  Rotator cuff assessment: neg bil  UPPER EXTREMITY ROM: Functional bil shoulder ROM  Active ROM Right eval Left eval  Shoulder flexion    Shoulder extension    Shoulder abduction    Shoulder adduction    Shoulder extension    Shoulder  internal rotation    Shoulder external rotation    Elbow flexion    Elbow extension    Wrist flexion    Wrist extension    Wrist ulnar deviation    Wrist radial deviation    Wrist pronation    Wrist supination     (Blank rows = not tested)  UPPER EXTREMITY MMT: Grossly 5/5 bil except R  ER 4+5 with mild pain   TODAY'S TREATMENT:  DATE:   06/21/22 Bike L2x42min Doorway pec stretch 2x30 sec Reviewed scapular retraction Lumbar FOTO: 61.05% Bridge with TrA engagement 2x10 Alternate LE extension with TrA x 10 B Supine clam blue TB 2x10: bent knee fallout 2nd set Supine march blue tb 2x10 Lat pulls 25# x 20  06/05/22  See pt ed and HEP   PATIENT EDUCATION:  Education details: PT eval findings, anticipated POC, initial HEP, and postural awareness' Person educated: Patient Education method: Explanation, Demonstration, and Handouts Education comprehension: verbalized understanding and returned demonstration  HOME EXERCISE PROGRAM: Access Code: [email or web address] URL: [email or web address] Date: 06/05/2022 Prepared by: Raynelle Fanning  Exercises - Supine Lower Trunk Rotation  - 2 x daily - 7 x weekly - 1 sets - 5 reps - 10 sec hold - Cat Cow to Child's Pose  - 1 x daily - 7 x weekly - 1-3 sets - 10 reps - Seated Scapular Retraction  - 1 x daily - 7 x weekly - 1-3 sets - 10 reps - 2-3 sec hold - Doorway Pec Stretch at 90 Degrees Abduction  - 1 x daily - 7 x weekly - 1 sets - 3 reps - 30-60 seconds hold  ASSESSMENT:  CLINICAL IMPRESSION: Pt arrived with no complaints of pain in his back or shoulders. Focused primarily on core and hip exercises today with patient showing a good demonstration of exercises. Instruction throughout session to keep engagement of core muscles with exercises. FOTO was taken today. Required review of doorway pec stretch and provided cues to  avoid leaning posteriorly to target the pecs.  OBJECTIVE IMPAIRMENTS: decreased activity tolerance, decreased mobility, decreased ROM, decreased strength, increased muscle spasms, impaired flexibility, impaired UE functional use, postural dysfunction, and pain.   ACTIVITY LIMITATIONS: standing and locomotion level  PARTICIPATION LIMITATIONS:  walking for exercise  PERSONAL FACTORS: Fitness, Profession, Time since onset of injury/illness/exacerbation, and 1-2 comorbidities: DM, previous back surgery  are also affecting patient's functional outcome.   REHAB POTENTIAL: Excellent  CLINICAL DECISION MAKING: Evolving/moderate complexity  EVALUATION COMPLEXITY: Moderate   GOALS: Goals reviewed with patient? Yes  SHORT TERM GOALS: Target date: 07/03/2022   Patient will be independent with initial HEP.  Baseline:  Goal status: IN PROGRESS  2.  Patient will report ability to centralize radicular symptoms with therex.  Baseline:  Goal status: IN PROGRESS  3.  Complete FOTO for back by 2nd visit Baseline:  Goal status: IN PROGRESS  LONG TERM GOALS: Target date: 07/31/2022   Patient will be independent with advanced/ongoing HEP to improve outcomes and carryover.  Baseline:  Goal status: IN PROGRESS  2.  Patient will report 75% improvement in low back radicular pain to improve QOL.  Baseline:  Goal status: IN PROGRESS  3.  Patient will demonstrate full/functional pain free lumbar ROM to perform ADLs.   Baseline:  Goal status: IN PROGRESS  4.  Patient will report 2/50 on Modified Oswestry to demonstrate improved functional ability.  Baseline: 8 / 50 = 16.0 % Goal status: IN PROGRESS   5.  Patient will tolerate 30 min of walking without increased pain. Baseline:  Goal status: IN PROGRESS  6.  Patient to demonstrate ability to achieve and maintain good spinal alignment/posturing and body mechanics needed for daily activities. Baseline:  Goal status: IN PROGRESS  7.  Patient  will report 75% improvement in bil shoulder pain with petting cats and reaching outward with ADLS. Baseline:  Goal status: IN PROGRESS  8  Patient will report 17 on Quick Dash to demonstrate  improved functional ability.  Baseline: 27.3 / 100 = 27.3 % Goal status: IN PROGRESS  PLAN:  PT FREQUENCY: 2x/week  PT DURATION: 8 weeks  PLANNED INTERVENTIONS: Therapeutic exercises, Therapeutic activity, Neuromuscular re-education, Patient/Family education, Self Care, Joint mobilization, Aquatic Therapy, Dry Needling, Electrical stimulation, Spinal manipulation, Spinal mobilization, Cryotherapy, Moist heat, Taping, Traction, Ultrasound, Ionotophoresis 4mg /ml Dexamethasone, and Manual therapy.  PLAN FOR NEXT SESSION: correct lateral shift with lateral glide on wall, focus on flexiblity of lumbar, hips, knees, pecs. Postural strengthening.    Darleene Cleaver, PTA 06/21/2022, 2:10 PM

## 2022-06-22 ENCOUNTER — Encounter: Payer: Self-pay | Admitting: Family Medicine

## 2022-06-22 ENCOUNTER — Ambulatory Visit (INDEPENDENT_AMBULATORY_CARE_PROVIDER_SITE_OTHER): Payer: BC Managed Care – PPO | Admitting: Family Medicine

## 2022-06-22 VITALS — BP 100/70 | Ht 70.0 in | Wt 189.0 lb

## 2022-06-22 DIAGNOSIS — M25861 Other specified joint disorders, right knee: Secondary | ICD-10-CM | POA: Diagnosis not present

## 2022-06-22 DIAGNOSIS — M7581 Other shoulder lesions, right shoulder: Secondary | ICD-10-CM

## 2022-06-22 DIAGNOSIS — M778 Other enthesopathies, not elsewhere classified: Secondary | ICD-10-CM | POA: Diagnosis not present

## 2022-06-22 DIAGNOSIS — M5416 Radiculopathy, lumbar region: Secondary | ICD-10-CM

## 2022-06-22 NOTE — Assessment & Plan Note (Signed)
Continues to have mild intermittent pain.  Has started physical therapy. -Counseled on home exercise therapy and supportive care. -Continue physical therapy. -Could consider injection or further imaging

## 2022-06-22 NOTE — Patient Instructions (Signed)
Good to see you Please use ice as needed on the knee  Please continue physical therapy  Please send me a message in MyChart with any questions or updates.  Please see the clinic back in 4 weeks or as needed if better.   --Dr. Jordan Likes

## 2022-06-22 NOTE — Assessment & Plan Note (Signed)
No significant pain at this time since the epidural. -Counseled on home exercise therapy and supportive care. -Could consider repeat epidural

## 2022-06-22 NOTE — Assessment & Plan Note (Signed)
Acutely occurring.  Pain is anterior in nature.  No further leg pain since the epidural. -Counseled on home exercise therapy and supportive care. -Could consider injection or further imaging

## 2022-06-22 NOTE — Assessment & Plan Note (Signed)
Continues to have pain and popping intermittently.  - counseled on home exercise therapy and supportive care - continue physical therapy  - could consider injection or further imaging.

## 2022-06-22 NOTE — Progress Notes (Signed)
  Anthony Russell - 42 y.o. male MRN [id number]  Date of birth: 09/19/1980  SUBJECTIVE:  Including CC & ROS.  No chief complaint on file.   Anthony Russell is a 42 y.o. male that is following up for his bilateral shoulder pain, lumbar radiculopathy and anterior knee pain.  His radicular pain is improved with the epidural.  He does have anterior knee pain.  He has a history of meniscal tear in that same knee.  No swelling or injury.  The shoulders are intermittent in nature.  He has started physical therapy.   Review of Systems See HPI   HISTORY: Past Medical, Surgical, Social, and Family History Reviewed & Updated per EMR.   Pertinent Historical Findings include:  Past Medical History:  Diagnosis Date   Allergy    Allergy, unspecified not elsewhere classified    rx w/ OTC antihistamines PRN   Anxiety    Asthma    Atypical chest pain    recent neg eval w/ normal cxr/ekg   Depression    Diverticulosis    DM (diabetes mellitus) (HCC)    Dyspepsia    on protonix 40mg /d   Esophagitis    GERD (gastroesophageal reflux disease)    on protonix 40mg /d   HTN (hypertension)    Hyperlipidemia    on diet alone   IBS (irritable bowel syndrome)    Lumbar back pain    s/p lumbar laminectomy 2007 by DrCabbell   Migraines    Overweight(278.02)    weight Jan10=246#.[name].he was 225# in 9/07...diet and exercise was discussed   Sleep apnea    not wearing c-pap currently    Past Surgical History:  Procedure Laterality Date   DENTAL SURGERY     LUMBAR LAMINECTOMY  01/30/2005   DrCabbell   TEAR DUCT PROBING       PHYSICAL EXAM:  VS: BP 100/70 (BP Location: Left Arm, Patient Position: Sitting)   Ht 5\' 10"  (1.778 m)   Wt 189 lb (85.7 kg)   BMI 27.12 kg/m  Physical Exam Gen: NAD, alert, cooperative with exam, well-appearing MSK:  Neurovascularly intact     ASSESSMENT & PLAN:   Lumbar radiculopathy No significant pain at this time since the epidural. -Counseled on home exercise  therapy and supportive care. -Could consider repeat epidural  Capsulitis of left shoulder Continues to have mild intermittent pain.  Has started physical therapy. -Counseled on home exercise therapy and supportive care. -Continue physical therapy. -Could consider injection or further imaging  Impingement of knee joint, right Acutely occurring.  Pain is anterior in nature.  No further leg pain since the epidural. -Counseled on home exercise therapy and supportive care. -Could consider injection or further imaging  Rotator cuff tendinitis, right Continues to have pain and popping intermittently.  - counseled on home exercise therapy and supportive care - continue physical therapy  - could consider injection or further imaging.

## 2022-06-28 ENCOUNTER — Ambulatory Visit: Payer: BC Managed Care – PPO

## 2022-06-28 DIAGNOSIS — M25511 Pain in right shoulder: Secondary | ICD-10-CM | POA: Diagnosis not present

## 2022-06-28 DIAGNOSIS — M778 Other enthesopathies, not elsewhere classified: Secondary | ICD-10-CM | POA: Diagnosis not present

## 2022-06-28 DIAGNOSIS — R293 Abnormal posture: Secondary | ICD-10-CM

## 2022-06-28 DIAGNOSIS — M7581 Other shoulder lesions, right shoulder: Secondary | ICD-10-CM | POA: Diagnosis not present

## 2022-06-28 DIAGNOSIS — M5416 Radiculopathy, lumbar region: Secondary | ICD-10-CM

## 2022-06-28 DIAGNOSIS — R252 Cramp and spasm: Secondary | ICD-10-CM | POA: Diagnosis not present

## 2022-06-28 DIAGNOSIS — M25512 Pain in left shoulder: Secondary | ICD-10-CM | POA: Diagnosis not present

## 2022-06-28 NOTE — Therapy (Signed)
OUTPATIENT PHYSICAL THERAPY THORACOLUMBAR AND SHOULDER TREATMENT   Patient Name: Anthony Russell MRN: [id number] DOB:November 10, 1980, 42 y.o., male Today's Date: 06/28/2022  END OF SESSION:  PT End of Session - 06/28/22 0937     Visit Number 3    Date for PT Re-Evaluation 07/31/22    Authorization Type [employer]    PT Start Time 0932    PT Stop Time 1014    PT Time Calculation (min) 42 min    Activity Tolerance Patient tolerated treatment well    Behavior During Therapy WFL for tasks assessed/performed              Past Medical History:  Diagnosis Date   Allergy    Allergy, unspecified not elsewhere classified    rx w/ OTC antihistamines PRN   Anxiety    Asthma    Atypical chest pain    recent neg eval w/ normal cxr/ekg   Depression    Diverticulosis    DM (diabetes mellitus) (HCC)    Dyspepsia    on protonix 40mg /d   Esophagitis    GERD (gastroesophageal reflux disease)    on protonix 40mg /d   HTN (hypertension)    Hyperlipidemia    on diet alone   IBS (irritable bowel syndrome)    Lumbar back pain    s/p lumbar laminectomy 2007 by DrCabbell   Migraines    Overweight(278.02)    weight Jan10=246#.[name].he was 225# in 9/07...diet and exercise was discussed   Sleep apnea    not wearing c-pap currently   Past Surgical History:  Procedure Laterality Date   DENTAL SURGERY     LUMBAR LAMINECTOMY  01/30/2005   DrCabbell   TEAR DUCT PROBING     Patient Active Problem List   Diagnosis Date Noted   Lumbar radiculopathy 05/10/2022   Capsulitis of left shoulder 05/10/2022   Sinus tachycardia 12/28/2021   Impingement of knee joint, right 12/20/2021   Achilles tendinitis, right leg 12/20/2021   Rotator cuff tendinitis, right 11/04/2021   Encounter for screening for other metabolic disorders 04/07/2021   Dyspnea on exertion 04/07/2021   Migraines 02/23/2021   Lumbar back pain 02/23/2021   HTN (hypertension) 02/23/2021   Dyspepsia 02/23/2021   DM (diabetes mellitus) (HCC)  02/23/2021   Nasal septal deviation 02/17/2021   Laryngitis 01/19/2021   Asymmetric tonsils 12/01/2020   Subacromial bursitis of right shoulder joint 05/17/2020   Hypersomnia 11/03/2015   Obesity 11/03/2015   Allergic rhinitis 04/15/2014   Asthma in adult 04/15/2014   GERD (gastroesophageal reflux disease) 04/15/2014   IBS (irritable bowel syndrome) 04/01/2013   Nausea alone 01/15/2013   Diarrhea 01/15/2013   Depression 10/15/2012   Lesion of eyebrow 02/09/2012   Gastroparesis 07/05/2011   Anxiety 07/05/2010   TESTOSTERONE DEFICIENCY 05/26/2008   Hyperlipidemia 02/06/2008   Facet arthropathy, lumbar 02/06/2008   Atypical chest pain 02/06/2008    PCP: Esperanza Richters, PA-C   REFERRING PROVIDER: Lenda Kelp, MD   REFERRING DIAG:  54.16 (ICD-10-CM) - Lumbar radiculopathy  M75.81 (ICD-10-CM) - Rotator cuff tendinitis, right  M77.8 (ICD-10-CM) - Capsulitis of left shoulder    Rationale for Evaluation and Treatment: Rehabilitation  THERAPY DIAG:  Radiculopathy, lumbar region  Acute pain of both shoulders  Cramp and spasm  Abnormal posture  ONSET DATE: R leg pain several years; Shoulders - months  SUBJECTIVE:  SUBJECTIVE STATEMENT: Pt reports doing HEP but afterwards became sore.   PERTINENT HISTORY:  DM, Lumbar laminectomy L 2007  PAIN:  Are you having pain? Yes: NPRS scale: 5/10 Pain location: R anterior thigh down to knee  Are you having pain? Yes: NPRS scale: 0/10 Pain location: deltoid region Pain description: throbbing, intermittent Aggravating factors: horizontal ABD bil Relieving factors: rest  PRECAUTIONS: None  WEIGHT BEARING RESTRICTIONS: No  FALLS:  Has patient fallen in last 6 months? No  LIVING ENVIRONMENT: Lives with: lives alone Lives in:  House/apartment Stairs: Yes: External: 2 flights steps; can reach both Has following equipment at home: None  OCCUPATION: Sport and exercise psychologist, works at home; has sit down and stand up desk  PLOF: Independent  PATIENT GOALS: get regular movement back in back, be able to exercise without pain and pet my cats  NEXT MD VISIT: 06/19/22  OBJECTIVE:   DIAGNOSTIC FINDINGS:  XR Left - negative XR Right - Mild glenohumeral degenerative changes without loss of joint space.  [place] Right - thickening of the coracohumeral ligament and the deltoid ligament   PATIENT SURVEYS:  Modified Oswestry 8 / 50 = 16.0 %  Quick Dash 27.3 / 100 = 27.3 % FOTO TBD  SCREENING FOR RED FLAGS: Neg  COGNITION: Overall cognitive status: Within functional limits for tasks assessed     SENSATION: WFL  MUSCLE LENGTH: Marked tightness in bil HS, quads, hip flexors, hip rotators and lumbar  POSTURE: rounded shoulders, forward head, and tight QL with depressed R scapula; left lateral shift  PALPATION: R gluteus medius and lumbar R>L  LUMBAR ROM:   AROM eval  Flexion Limited by HS to knees  Extension 75% limited  Right lateral flexion 50% limited  Left lateral flexion 50% limited  Right rotation 25% limited  Left rotation 25% limited   (Blank rows = not tested)  LOWER EXTREMITY ROM:   marked flexibility deficits affecting full range of bil hip and knee joints  LOWER EXTREMITY MMT:  Grossly 5/5 in  B LE   LUMBAR SPECIAL TESTS:  Straight leg raise test: Positive and Slump test: Positive  SHOULDER SPECIAL TESTS: Impingement tests: Neer impingement test: negative and Hawkins/Kennedy impingement test: positive  R  Rotator cuff assessment: neg bil  UPPER EXTREMITY ROM: Functional bil shoulder ROM  Active ROM Right eval Left eval  Shoulder flexion    Shoulder extension    Shoulder abduction    Shoulder adduction    Shoulder extension    Shoulder internal rotation    Shoulder external rotation     Elbow flexion    Elbow extension    Wrist flexion    Wrist extension    Wrist ulnar deviation    Wrist radial deviation    Wrist pronation    Wrist supination     (Blank rows = not tested)  UPPER EXTREMITY MMT: Grossly 5/5 bil except R  ER 4+5 with mild pain   TODAY'S TREATMENT:  DATE: 06/28/22 Therapeutic Exercise: Bike L3x33min Bridge with blue TB 2x10 S/L clamshells blue TB 2x10 B Supine R quad stretch w/ strap 2x30 sec Lat pulls 35# 2x10 Seated rows 35# 2x10 Suqat with 5lb DB touch to mat table 2x10 Deadlift x 10 no weight B ER red TB x 10  Manual Therapy:  STM to R quads, more RF, VL  06/21/22 Bike L2x81min Doorway pec stretch 2x30 sec Reviewed scapular retraction Lumbar FOTO: 61.05% Bridge with TrA engagement 2x10 Alternate LE extension with TrA x 10 B Supine clam blue TB 2x10: bent knee fallout 2nd set Supine march blue tb 2x10 Lat pulls 25# x 20  06/05/22  See pt ed and HEP   PATIENT EDUCATION:  Education details:  Person educated: Patient Education method: Explanation, Demonstration, and Handouts Education comprehension: verbalized understanding and returned demonstration  HOME EXERCISE PROGRAM: Access Code: [email or web address] URL: [email or web address] Date: 06/05/2022 Prepared by: Raynelle Fanning  Exercises - Supine Lower Trunk Rotation  - 2 x daily - 7 x weekly - 1 sets - 5 reps - 10 sec hold - Cat Cow to Child's Pose  - 1 x daily - 7 x weekly - 1-3 sets - 10 reps - Seated Scapular Retraction  - 1 x daily - 7 x weekly - 1-3 sets - 10 reps - 2-3 sec hold - Doorway Pec Stretch at 90 Degrees Abduction  - 1 x daily - 7 x weekly - 1 sets - 3 reps - 30-60 seconds hold  ASSESSMENT:  CLINICAL IMPRESSION: Pt continues to note R thigh numbness. He did have some tension in the R quads, that released with manual. He tolerated the progression  of exercises well. Cues required with deadlifts to avoid rounding the back. His periscap muscles show some weakness with rows and external rotation.   OBJECTIVE IMPAIRMENTS: decreased activity tolerance, decreased mobility, decreased ROM, decreased strength, increased muscle spasms, impaired flexibility, impaired UE functional use, postural dysfunction, and pain.   ACTIVITY LIMITATIONS: standing and locomotion level  PARTICIPATION LIMITATIONS:  walking for exercise  PERSONAL FACTORS: Fitness, Profession, Time since onset of injury/illness/exacerbation, and 1-2 comorbidities: DM, previous back surgery  are also affecting patient's functional outcome.   REHAB POTENTIAL: Excellent  CLINICAL DECISION MAKING: Evolving/moderate complexity  EVALUATION COMPLEXITY: Moderate   GOALS: Goals reviewed with patient? Yes  SHORT TERM GOALS: Target date: 07/03/2022   Patient will be independent with initial HEP.  Baseline:  Goal status: IN PROGRESS  2.  Patient will report ability to centralize radicular symptoms with therex.  Baseline:  Goal status: IN PROGRESS  3.  Complete FOTO for back by 2nd visit Baseline:  Goal status: MET  LONG TERM GOALS: Target date: 07/31/2022   Patient will be independent with advanced/ongoing HEP to improve outcomes and carryover.  Baseline:  Goal status: IN PROGRESS  2.  Patient will report 75% improvement in low back radicular pain to improve QOL.  Baseline:  Goal status: IN PROGRESS  3.  Patient will demonstrate full/functional pain free lumbar ROM to perform ADLs.   Baseline:  Goal status: IN PROGRESS  4.  Patient will report 2/50 on Modified Oswestry to demonstrate improved functional ability.  Baseline: 8 / 50 = 16.0 % Goal status: IN PROGRESS   5.  Patient will tolerate 30 min of walking without increased pain. Baseline:  Goal status: IN PROGRESS  6.  Patient to demonstrate ability to achieve and maintain good spinal alignment/posturing and body  mechanics needed for daily activities. Baseline:  Goal status:  IN PROGRESS  7.  Patient will report 75% improvement in bil shoulder pain with petting cats and reaching outward with ADLS. Baseline:  Goal status: IN PROGRESS  8  Patient will report 17 on Quick Dash to demonstrate improved functional ability.  Baseline: 27.3 / 100 = 27.3 % Goal status: IN PROGRESS  PLAN:  PT FREQUENCY: 2x/week  PT DURATION: 8 weeks  PLANNED INTERVENTIONS: Therapeutic exercises, Therapeutic activity, Neuromuscular re-education, Patient/Family education, Self Care, Joint mobilization, Aquatic Therapy, Dry Needling, Electrical stimulation, Spinal manipulation, Spinal mobilization, Cryotherapy, Moist heat, Taping, Traction, Ultrasound, Ionotophoresis 4mg /ml Dexamethasone, and Manual therapy.  PLAN FOR NEXT SESSION: correct lateral shift with lateral glide on wall, focus on flexiblity of lumbar, hips, knees, pecs. Postural strengthening.    Darleene Cleaver, PTA 06/28/2022, 10:17 AM

## 2022-06-30 ENCOUNTER — Ambulatory Visit: Payer: BC Managed Care – PPO

## 2022-06-30 DIAGNOSIS — R252 Cramp and spasm: Secondary | ICD-10-CM

## 2022-06-30 DIAGNOSIS — M5416 Radiculopathy, lumbar region: Secondary | ICD-10-CM

## 2022-06-30 DIAGNOSIS — R293 Abnormal posture: Secondary | ICD-10-CM

## 2022-06-30 DIAGNOSIS — M25512 Pain in left shoulder: Secondary | ICD-10-CM | POA: Diagnosis not present

## 2022-06-30 DIAGNOSIS — M25511 Pain in right shoulder: Secondary | ICD-10-CM | POA: Diagnosis not present

## 2022-06-30 DIAGNOSIS — M778 Other enthesopathies, not elsewhere classified: Secondary | ICD-10-CM | POA: Diagnosis not present

## 2022-06-30 DIAGNOSIS — M7581 Other shoulder lesions, right shoulder: Secondary | ICD-10-CM | POA: Diagnosis not present

## 2022-06-30 NOTE — Therapy (Signed)
OUTPATIENT PHYSICAL THERAPY THORACOLUMBAR AND SHOULDER TREATMENT   Patient Name: Anthony Russell MRN: [id number] DOB:[birth year], 42 y.o., male Today's Date: 06/30/2022  END OF SESSION:  PT End of Session - 06/30/22 0808     Visit Number 4    Date for PT Re-Evaluation 07/31/22    Authorization Type BCBS    PT Start Time 0803    PT Stop Time 0845    PT Time Calculation (min) 42 min    Activity Tolerance Patient tolerated treatment well    Behavior During Therapy WFL for tasks assessed/performed               Past Medical History:  Diagnosis Date   Allergy    Allergy, unspecified not elsewhere classified    rx w/ OTC antihistamines PRN   Anxiety    Asthma    Atypical chest pain    recent neg eval w/ normal cxr/ekg   Depression    Diverticulosis    DM (diabetes mellitus) (HCC)    Dyspepsia    on protonix 40mg /d   Esophagitis    GERD (gastroesophageal reflux disease)    on protonix 40mg /d   HTN (hypertension)    Hyperlipidemia    on diet alone   IBS (irritable bowel syndrome)    Lumbar back pain    s/p lumbar laminectomy 2007 by DrCabbell   Migraines    Overweight(278.02)    weight Jan10=246#.[name].he was 225# in 9/07...diet and exercise was discussed   Sleep apnea    not wearing c-pap currently   Past Surgical History:  Procedure Laterality Date   DENTAL SURGERY     LUMBAR LAMINECTOMY  01/30/2005   DrCabbell   TEAR DUCT PROBING     Patient Active Problem List   Diagnosis Date Noted   Lumbar radiculopathy 05/10/2022   Capsulitis of left shoulder 05/10/2022   Sinus tachycardia 12/28/2021   Impingement of knee joint, right 12/20/2021   Achilles tendinitis, right leg 12/20/2021   Rotator cuff tendinitis, right 11/04/2021   Encounter for screening for other metabolic disorders 04/07/2021   Dyspnea on exertion 04/07/2021   Migraines 02/23/2021   Lumbar back pain 02/23/2021   HTN (hypertension) 02/23/2021   Dyspepsia 02/23/2021   DM (diabetes mellitus)  (HCC) 02/23/2021   Nasal septal deviation 02/17/2021   Laryngitis 01/19/2021   Asymmetric tonsils 12/01/2020   Subacromial bursitis of right shoulder joint 05/17/2020   Hypersomnia 11/03/2015   Obesity 11/03/2015   Allergic rhinitis 04/15/2014   Asthma in adult 04/15/2014   GERD (gastroesophageal reflux disease) 04/15/2014   IBS (irritable bowel syndrome) 04/01/2013   Nausea alone 01/15/2013   Diarrhea 01/15/2013   Depression 10/15/2012   Lesion of eyebrow 02/09/2012   Gastroparesis 07/05/2011   Anxiety 07/05/2010   TESTOSTERONE DEFICIENCY 05/26/2008   Hyperlipidemia 02/06/2008   Facet arthropathy, lumbar 02/06/2008   Atypical chest pain 02/06/2008    PCP: Esperanza Richters, PA-C   REFERRING PROVIDER: Lenda Kelp, MD   REFERRING DIAG:  54.16 (ICD-10-CM) - Lumbar radiculopathy  M75.81 (ICD-10-CM) - Rotator cuff tendinitis, right  M77.8 (ICD-10-CM) - Capsulitis of left shoulder    Rationale for Evaluation and Treatment: Rehabilitation  THERAPY DIAG:  Radiculopathy, lumbar region  Acute pain of both shoulders  Cramp and spasm  Abnormal posture  ONSET DATE: R leg pain several years; Shoulders - months  SUBJECTIVE:  SUBJECTIVE STATEMENT: Pt reports no pain today.  PERTINENT HISTORY:  DM, Lumbar laminectomy L 2007  PAIN:  Are you having pain? Yes: NPRS scale: 0/10 Pain location: R anterior thigh down to knee  Are you having pain? Yes: NPRS scale: 0/10 Pain location: deltoid region Pain description: throbbing, intermittent Aggravating factors: horizontal ABD bil Relieving factors: rest  PRECAUTIONS: None  WEIGHT BEARING RESTRICTIONS: No  FALLS:  Has patient fallen in last 6 months? No  LIVING ENVIRONMENT: Lives with: lives alone Lives in: House/apartment Stairs:  Yes: External: 2 flights steps; can reach both Has following equipment at home: None  OCCUPATION: Sport and exercise psychologist, works at home; has sit down and stand up desk  PLOF: Independent  PATIENT GOALS: get regular movement back in back, be able to exercise without pain and pet my cats  NEXT MD VISIT: 06/19/22  OBJECTIVE:   DIAGNOSTIC FINDINGS:  XR Left - negative XR Right - Mild glenohumeral degenerative changes without loss of joint space.  [place] Right - thickening of the coracohumeral ligament and the deltoid ligament   PATIENT SURVEYS:  Modified Oswestry 8 / 50 = 16.0 %  Quick Dash 27.3 / 100 = 27.3 % FOTO TBD  SCREENING FOR RED FLAGS: Neg  COGNITION: Overall cognitive status: Within functional limits for tasks assessed     SENSATION: WFL  MUSCLE LENGTH: Marked tightness in bil HS, quads, hip flexors, hip rotators and lumbar  POSTURE: rounded shoulders, forward head, and tight QL with depressed R scapula; left lateral shift  PALPATION: R gluteus medius and lumbar R>L  LUMBAR ROM:   AROM eval  Flexion Limited by HS to knees  Extension 75% limited  Right lateral flexion 50% limited  Left lateral flexion 50% limited  Right rotation 25% limited  Left rotation 25% limited   (Blank rows = not tested)  LOWER EXTREMITY ROM:   marked flexibility deficits affecting full range of bil hip and knee joints  LOWER EXTREMITY MMT:  Grossly 5/5 in  B LE   LUMBAR SPECIAL TESTS:  Straight leg raise test: Positive and Slump test: Positive  SHOULDER SPECIAL TESTS: Impingement tests: Neer impingement test: negative and Hawkins/Kennedy impingement test: positive  R  Rotator cuff assessment: neg bil  UPPER EXTREMITY ROM: Functional bil shoulder ROM  Active ROM Right eval Left eval  Shoulder flexion    Shoulder extension    Shoulder abduction    Shoulder adduction    Shoulder extension    Shoulder internal rotation    Shoulder external rotation    Elbow flexion    Elbow  extension    Wrist flexion    Wrist extension    Wrist ulnar deviation    Wrist radial deviation    Wrist pronation    Wrist supination     (Blank rows = not tested)  UPPER EXTREMITY MMT: Grossly 5/5 bil except R  ER 4+5 with mild pain   TODAY'S TREATMENT:  DATE: 06/30/22 Therapeutic Exercise: UBE L2.0 3 min fwd/ 3 min back Squats 6lb 2x10 touch to table Deadlift with 6lb touch to table 2x10 - cues to avoid rounding back Standing ER with red TB against wall x 10 with emphasis on cervical extension Standing horiz ABD red TB against wall 2x10 with emphasis on cervical extension Standing shoulder flexion bil with thoracic ext  Bridge with blue TB 2x10 with 5 second hold Donkey kick in quadruped x 10 bil w/ blue TB Prone hip extension x10  Supine figure 4 stretch 30 seconds bil SKTC 30 sec bil Seated isometric abs with yellow pball 10x3"  06/28/22 Therapeutic Exercise: Bike L3x57min Bridge with blue TB 2x10 S/L clamshells blue TB 2x10 B Supine R quad stretch w/ strap 2x30 sec Lat pulls 35# 2x10 Seated rows 35# 2x10 Suqat with 5lb DB touch to mat table 2x10 Deadlift x 10 no weight B ER red TB x 10  Manual Therapy:  STM to R quads, more RF, VL  06/21/22 Bike L2x56min Doorway pec stretch 2x30 sec Reviewed scapular retraction Lumbar FOTO: 61.05% Bridge with TrA engagement 2x10 Alternate LE extension with TrA x 10 B Supine clam blue TB 2x10: bent knee fallout 2nd set Supine march blue tb 2x10 Lat pulls 25# x 20  06/05/22  See pt ed and HEP   PATIENT EDUCATION:  Education details:  Person educated: Patient Education method: Explanation, Demonstration, and Handouts Education comprehension: verbalized understanding and returned demonstration  HOME EXERCISE PROGRAM: Access Code: [email or web address] URL: [email or web address] Date:  06/05/2022 Prepared by: Raynelle Fanning  Exercises - Supine Lower Trunk Rotation  - 2 x daily - 7 x weekly - 1 sets - 5 reps - 10 sec hold - Cat Cow to Child's Pose  - 1 x daily - 7 x weekly - 1-3 sets - 10 reps - Seated Scapular Retraction  - 1 x daily - 7 x weekly - 1-3 sets - 10 reps - 2-3 sec hold - Doorway Pec Stretch at 90 Degrees Abduction  - 1 x daily - 7 x weekly - 1 sets - 3 reps - 30-60 seconds hold  ASSESSMENT:  CLINICAL IMPRESSION: Pt was able to progress through exercises with no issues. Continued an emphasis on general lumbar strength as well as scapular stabilization and postural facilitation. Did require cues to avoid rounding his back with the deadlifts. Also requiring instruction to avoid cervical flexion with postural strengthening. Also provided updates for HEP.  OBJECTIVE IMPAIRMENTS: decreased activity tolerance, decreased mobility, decreased ROM, decreased strength, increased muscle spasms, impaired flexibility, impaired UE functional use, postural dysfunction, and pain.   ACTIVITY LIMITATIONS: standing and locomotion level  PARTICIPATION LIMITATIONS:  walking for exercise  PERSONAL FACTORS: Fitness, Profession, Time since onset of injury/illness/exacerbation, and 1-2 comorbidities: DM, previous back surgery  are also affecting patient's functional outcome.   REHAB POTENTIAL: Excellent  CLINICAL DECISION MAKING: Evolving/moderate complexity  EVALUATION COMPLEXITY: Moderate   GOALS: Goals reviewed with patient? Yes  SHORT TERM GOALS: Target date: 07/03/2022   Patient will be independent with initial HEP.  Baseline:  Goal status: IN PROGRESS  2.  Patient will report ability to centralize radicular symptoms with therex.  Baseline:  Goal status: IN PROGRESS- 06/30/22   3.  Complete FOTO for back by 2nd visit Baseline:  Goal status: MET  LONG TERM GOALS: Target date: 07/31/2022   Patient will be independent with advanced/ongoing HEP to improve outcomes and  carryover.  Baseline:  Goal status: IN PROGRESS  2.  Patient  will report 75% improvement in low back radicular pain to improve QOL.  Baseline:  Goal status: IN PROGRESS  3.  Patient will demonstrate full/functional pain free lumbar ROM to perform ADLs.   Baseline:  Goal status: IN PROGRESS  4.  Patient will report 2/50 on Modified Oswestry to demonstrate improved functional ability.  Baseline: 8 / 50 = 16.0 % Goal status: IN PROGRESS   5.  Patient will tolerate 30 min of walking without increased pain. Baseline:  Goal status: IN PROGRESS  6.  Patient to demonstrate ability to achieve and maintain good spinal alignment/posturing and body mechanics needed for daily activities. Baseline:  Goal status: IN PROGRESS  7.  Patient will report 75% improvement in bil shoulder pain with petting cats and reaching outward with ADLS. Baseline:  Goal status: IN PROGRESS  8  Patient will report 17 on Quick Dash to demonstrate improved functional ability.  Baseline: 27.3 / 100 = 27.3 % Goal status: IN PROGRESS  PLAN:  PT FREQUENCY: 2x/week  PT DURATION: 8 weeks  PLANNED INTERVENTIONS: Therapeutic exercises, Therapeutic activity, Neuromuscular re-education, Patient/Family education, Self Care, Joint mobilization, Aquatic Therapy, Dry Needling, Electrical stimulation, Spinal manipulation, Spinal mobilization, Cryotherapy, Moist heat, Taping, Traction, Ultrasound, Ionotophoresis 4mg /ml Dexamethasone, and Manual therapy.  PLAN FOR NEXT SESSION: correct lateral shift with lateral glide on wall, focus on flexiblity of lumbar, hips, knees, pecs. Postural strengthening.    Darleene Cleaver, PTA 06/30/2022, 8:48 AM

## 2022-07-03 ENCOUNTER — Ambulatory Visit: Payer: BC Managed Care – PPO | Attending: Family Medicine

## 2022-07-03 DIAGNOSIS — M25511 Pain in right shoulder: Secondary | ICD-10-CM | POA: Diagnosis not present

## 2022-07-03 DIAGNOSIS — R252 Cramp and spasm: Secondary | ICD-10-CM

## 2022-07-03 DIAGNOSIS — R293 Abnormal posture: Secondary | ICD-10-CM

## 2022-07-03 DIAGNOSIS — M5416 Radiculopathy, lumbar region: Secondary | ICD-10-CM | POA: Diagnosis not present

## 2022-07-03 DIAGNOSIS — M25512 Pain in left shoulder: Secondary | ICD-10-CM | POA: Diagnosis not present

## 2022-07-03 NOTE — Therapy (Signed)
OUTPATIENT PHYSICAL THERAPY THORACOLUMBAR AND SHOULDER TREATMENT   Patient Name: Anthony Russell MRN: [id number] DOB:09-22-1980, 42 y.o., male Today's Date: 07/03/2022  END OF SESSION:  PT End of Session - 07/03/22 1630     Visit Number 5    Date for PT Re-Evaluation 07/31/22    Authorization Type BCBS    PT Start Time 1618    PT Stop Time 1702    PT Time Calculation (min) 44 min    Activity Tolerance Patient tolerated treatment well    Behavior During Therapy WFL for tasks assessed/performed                Past Medical History:  Diagnosis Date   Allergy    Allergy, unspecified not elsewhere classified    rx w/ OTC antihistamines PRN   Anxiety    Asthma    Atypical chest pain    recent neg eval w/ normal cxr/ekg   Depression    Diverticulosis    DM (diabetes mellitus) (HCC)    Dyspepsia    on protonix 40mg /d   Esophagitis    GERD (gastroesophageal reflux disease)    on protonix 40mg /d   HTN (hypertension)    Hyperlipidemia    on diet alone   IBS (irritable bowel syndrome)    Lumbar back pain    s/p lumbar laminectomy 2007 by DrCabbell   Migraines    Overweight(278.02)    weight Jan10=246#.[name].he was 225# in 9/07...diet and exercise was discussed   Sleep apnea    not wearing c-pap currently   Past Surgical History:  Procedure Laterality Date   DENTAL SURGERY     LUMBAR LAMINECTOMY  01/30/2005   DrCabbell   TEAR DUCT PROBING     Patient Active Problem List   Diagnosis Date Noted   Lumbar radiculopathy 05/10/2022   Capsulitis of left shoulder 05/10/2022   Sinus tachycardia 12/28/2021   Impingement of knee joint, right 12/20/2021   Achilles tendinitis, right leg 12/20/2021   Rotator cuff tendinitis, right 11/04/2021   Encounter for screening for other metabolic disorders 04/07/2021   Dyspnea on exertion 04/07/2021   Migraines 02/23/2021   Lumbar back pain 02/23/2021   HTN (hypertension) 02/23/2021   Dyspepsia 02/23/2021   DM (diabetes mellitus)  (HCC) 02/23/2021   Nasal septal deviation 02/17/2021   Laryngitis 01/19/2021   Asymmetric tonsils 12/01/2020   Subacromial bursitis of right shoulder joint 05/17/2020   Hypersomnia 11/03/2015   Obesity 11/03/2015   Allergic rhinitis 04/15/2014   Asthma in adult 04/15/2014   GERD (gastroesophageal reflux disease) 04/15/2014   IBS (irritable bowel syndrome) 04/01/2013   Nausea alone 01/15/2013   Diarrhea 01/15/2013   Depression 10/15/2012   Lesion of eyebrow 02/09/2012   Gastroparesis 07/05/2011   Anxiety 07/05/2010   TESTOSTERONE DEFICIENCY 05/26/2008   Hyperlipidemia 02/06/2008   Facet arthropathy, lumbar 02/06/2008   Atypical chest pain 02/06/2008    PCP: Esperanza Richters, PA-C   REFERRING PROVIDER: Lenda Kelp, MD   REFERRING DIAG:  54.16 (ICD-10-CM) - Lumbar radiculopathy  M75.81 (ICD-10-CM) - Rotator cuff tendinitis, right  M77.8 (ICD-10-CM) - Capsulitis of left shoulder    Rationale for Evaluation and Treatment: Rehabilitation  THERAPY DIAG:  Radiculopathy, lumbar region  Acute pain of both shoulders  Cramp and spasm  Abnormal posture  ONSET DATE: R leg pain several years; Shoulders - months  SUBJECTIVE:  SUBJECTIVE STATEMENT: Doing good. Pt denies any shoulder pain unless extending his arm out too far.  PERTINENT HISTORY:  DM, Lumbar laminectomy L 2007  PAIN:  Are you having pain? Yes: NPRS scale: 0/10 Pain location: R anterior thigh down to knee  Are you having pain? Yes: NPRS scale: 0/10 Pain location: deltoid region Pain description: throbbing, intermittent Aggravating factors: horizontal ABD bil Relieving factors: rest  PRECAUTIONS: None  WEIGHT BEARING RESTRICTIONS: No  FALLS:  Has patient fallen in last 6 months? No  LIVING ENVIRONMENT: Lives  with: lives alone Lives in: House/apartment Stairs: Yes: External: 2 flights steps; can reach both Has following equipment at home: None  OCCUPATION: Sport and exercise psychologist, works at home; has sit down and stand up desk  PLOF: Independent  PATIENT GOALS: get regular movement back in back, be able to exercise without pain and pet my cats  NEXT MD VISIT: 06/19/22  OBJECTIVE:   DIAGNOSTIC FINDINGS:  XR Left - negative XR Right - Mild glenohumeral degenerative changes without loss of joint space.  [place] Right - thickening of the coracohumeral ligament and the deltoid ligament   PATIENT SURVEYS:  Modified Oswestry 8 / 50 = 16.0 %  Quick Dash 27.3 / 100 = 27.3 % FOTO TBD  SCREENING FOR RED FLAGS: Neg  COGNITION: Overall cognitive status: Within functional limits for tasks assessed     SENSATION: WFL  MUSCLE LENGTH: Marked tightness in bil HS, quads, hip flexors, hip rotators and lumbar  POSTURE: rounded shoulders, forward head, and tight QL with depressed R scapula; left lateral shift  PALPATION: R gluteus medius and lumbar R>L  LUMBAR ROM:   AROM eval  Flexion Limited by HS to knees  Extension 75% limited  Right lateral flexion 50% limited  Left lateral flexion 50% limited  Right rotation 25% limited  Left rotation 25% limited   (Blank rows = not tested)  LOWER EXTREMITY ROM:   marked flexibility deficits affecting full range of bil hip and knee joints  LOWER EXTREMITY MMT:  Grossly 5/5 in  B LE   LUMBAR SPECIAL TESTS:  Straight leg raise test: Positive and Slump test: Positive  SHOULDER SPECIAL TESTS: Impingement tests: Neer impingement test: negative and Hawkins/Kennedy impingement test: positive  R  Rotator cuff assessment: neg bil  UPPER EXTREMITY ROM: Functional bil shoulder ROM  Active ROM Right eval Left eval  Shoulder flexion    Shoulder extension    Shoulder abduction    Shoulder adduction    Shoulder extension    Shoulder internal rotation     Shoulder external rotation    Elbow flexion    Elbow extension    Wrist flexion    Wrist extension    Wrist ulnar deviation    Wrist radial deviation    Wrist pronation    Wrist supination     (Blank rows = not tested)  UPPER EXTREMITY MMT: Grossly 5/5 bil except R  ER 4+5 with mild pain   TODAY'S TREATMENT:  DATE: 07/03/22 Therapeutic Exercise: Bike L3x44min Quadruped multifidus hip raise 2x10 bil Bird dog x 10 bil Deadlift 6lb both sides 2x10 Kettle bell swing 2x10 6lb Standing ER red TB 2x10 Standing horizontal ABD red TB 2x10 Pallof press w/ cable 5# 2x10  06/30/22 Therapeutic Exercise: UBE L2.0 3 min fwd/ 3 min back Squats 6lb 2x10 touch to table Deadlift with 6lb touch to table 2x10 - cues to avoid rounding back Standing ER with red TB against wall x 10 with emphasis on cervical extension Standing horiz ABD red TB against wall 2x10 with emphasis on cervical extension Standing shoulder flexion bil with thoracic ext  Bridge with blue TB 2x10 with 5 second hold Donkey kick in quadruped x 10 bil w/ blue TB Prone hip extension x10  Supine figure 4 stretch 30 seconds bil SKTC 30 sec bil Seated isometric abs with yellow pball 10x3"  06/28/22 Therapeutic Exercise: Bike L3x48min Bridge with blue TB 2x10 S/L clamshells blue TB 2x10 B Supine R quad stretch w/ strap 2x30 sec Lat pulls 35# 2x10 Seated rows 35# 2x10 Suqat with 5lb DB touch to mat table 2x10 Deadlift x 10 no weight B ER red TB x 10  Manual Therapy:  STM to R quads, more RF, VL  06/21/22 Bike L2x84min Doorway pec stretch 2x30 sec Reviewed scapular retraction Lumbar FOTO: 61.05% Bridge with TrA engagement 2x10 Alternate LE extension with TrA x 10 B Supine clam blue TB 2x10: bent knee fallout 2nd set Supine march blue tb 2x10 Lat pulls 25# x 20  06/05/22  See pt ed and  HEP   PATIENT EDUCATION:  Education details:  Person educated: Patient Education method: Explanation, Demonstration, and Handouts Education comprehension: verbalized understanding and returned demonstration  HOME EXERCISE PROGRAM: Access Code: [email or web address] URL: [email or web address] Date: 06/05/2022 Prepared by: Raynelle Fanning  Exercises - Supine Lower Trunk Rotation  - 2 x daily - 7 x weekly - 1 sets - 5 reps - 10 sec hold - Cat Cow to Child's Pose  - 1 x daily - 7 x weekly - 1-3 sets - 10 reps - Seated Scapular Retraction  - 1 x daily - 7 x weekly - 1-3 sets - 10 reps - 2-3 sec hold - Doorway Pec Stretch at 90 Degrees Abduction  - 1 x daily - 7 x weekly - 1 sets - 3 reps - 30-60 seconds hold  ASSESSMENT:  CLINICAL IMPRESSION: Advanced through strength focused exercises to improve function. Progressed exercises as tolerated. He showed instability with bird dog and muscle fatigue with the postural strengthening. He was pretty fatigued post session. He would benefit from continued progression of exercises to improve core stability and muscular endurance.   OBJECTIVE IMPAIRMENTS: decreased activity tolerance, decreased mobility, decreased ROM, decreased strength, increased muscle spasms, impaired flexibility, impaired UE functional use, postural dysfunction, and pain.   ACTIVITY LIMITATIONS: standing and locomotion level  PARTICIPATION LIMITATIONS:  walking for exercise  PERSONAL FACTORS: Fitness, Profession, Time since onset of injury/illness/exacerbation, and 1-2 comorbidities: DM, previous back surgery  are also affecting patient's functional outcome.   REHAB POTENTIAL: Excellent  CLINICAL DECISION MAKING: Evolving/moderate complexity  EVALUATION COMPLEXITY: Moderate   GOALS: Goals reviewed with patient? Yes  SHORT TERM GOALS: Target date: 07/03/2022   Patient will be independent with initial HEP.  Baseline:  Goal status: IN PROGRESS  2.  Patient will report ability  to centralize radicular symptoms with therex.  Baseline:  Goal status: IN PROGRESS- 06/30/22   3.  Complete FOTO for back  by 2nd visit Baseline:  Goal status: MET  LONG TERM GOALS: Target date: 07/31/2022   Patient will be independent with advanced/ongoing HEP to improve outcomes and carryover.  Baseline:  Goal status: IN PROGRESS  2.  Patient will report 75% improvement in low back radicular pain to improve QOL.  Baseline:  Goal status: IN PROGRESS  3.  Patient will demonstrate full/functional pain free lumbar ROM to perform ADLs.   Baseline:  Goal status: IN PROGRESS  4.  Patient will report 2/50 on Modified Oswestry to demonstrate improved functional ability.  Baseline: 8 / 50 = 16.0 % Goal status: IN PROGRESS   5.  Patient will tolerate 30 min of walking without increased pain. Baseline:  Goal status: IN PROGRESS  6.  Patient to demonstrate ability to achieve and maintain good spinal alignment/posturing and body mechanics needed for daily activities. Baseline:  Goal status: IN PROGRESS  7.  Patient will report 75% improvement in bil shoulder pain with petting cats and reaching outward with ADLS. Baseline:  Goal status: IN PROGRESS  8  Patient will report 17 on Quick Dash to demonstrate improved functional ability.  Baseline: 27.3 / 100 = 27.3 % Goal status: IN PROGRESS  PLAN:  PT FREQUENCY: 2x/week  PT DURATION: 8 weeks  PLANNED INTERVENTIONS: Therapeutic exercises, Therapeutic activity, Neuromuscular re-education, Patient/Family education, Self Care, Joint mobilization, Aquatic Therapy, Dry Needling, Electrical stimulation, Spinal manipulation, Spinal mobilization, Cryotherapy, Moist heat, Taping, Traction, Ultrasound, Ionotophoresis 4mg /ml Dexamethasone, and Manual therapy.  PLAN FOR NEXT SESSION: review lateral shift with lateral glide on wall, Postural strengthening and core stability   Darleene Cleaver, PTA 07/03/2022, 5:24 PM

## 2022-07-06 ENCOUNTER — Ambulatory Visit: Payer: BC Managed Care – PPO

## 2022-07-06 DIAGNOSIS — M5416 Radiculopathy, lumbar region: Secondary | ICD-10-CM

## 2022-07-06 DIAGNOSIS — M25511 Pain in right shoulder: Secondary | ICD-10-CM

## 2022-07-06 DIAGNOSIS — R293 Abnormal posture: Secondary | ICD-10-CM | POA: Diagnosis not present

## 2022-07-06 DIAGNOSIS — R252 Cramp and spasm: Secondary | ICD-10-CM | POA: Diagnosis not present

## 2022-07-06 DIAGNOSIS — M25512 Pain in left shoulder: Secondary | ICD-10-CM | POA: Diagnosis not present

## 2022-07-06 NOTE — Therapy (Signed)
OUTPATIENT PHYSICAL THERAPY THORACOLUMBAR AND SHOULDER TREATMENT   Patient Name: Anthony Russell MRN: [id number] DOB:06-10-1980, 42 y.o., male Today's Date: 07/06/2022  END OF SESSION:  PT End of Session - 07/06/22 1304     Visit Number 6    Date for PT Re-Evaluation 07/31/22                Past Medical History:  Diagnosis Date   Allergy    Allergy, unspecified not elsewhere classified    rx w/ OTC antihistamines PRN   Anxiety    Asthma    Atypical chest pain    recent neg eval w/ normal cxr/ekg   Depression    Diverticulosis    DM (diabetes mellitus) (HCC)    Dyspepsia    on protonix 40mg /d   Esophagitis    GERD (gastroesophageal reflux disease)    on protonix 40mg /d   HTN (hypertension)    Hyperlipidemia    on diet alone   IBS (irritable bowel syndrome)    Lumbar back pain    s/p lumbar laminectomy 2007 by DrCabbell   Migraines    Overweight(278.02)    weight Jan10=246#.[name].he was 225# in 9/07...diet and exercise was discussed   Sleep apnea    not wearing c-pap currently   Past Surgical History:  Procedure Laterality Date   DENTAL SURGERY     LUMBAR LAMINECTOMY  01/30/2005   DrCabbell   TEAR DUCT PROBING     Patient Active Problem List   Diagnosis Date Noted   Lumbar radiculopathy 05/10/2022   Capsulitis of left shoulder 05/10/2022   Sinus tachycardia 12/28/2021   Impingement of knee joint, right 12/20/2021   Achilles tendinitis, right leg 12/20/2021   Rotator cuff tendinitis, right 11/04/2021   Encounter for screening for other metabolic disorders 04/07/2021   Dyspnea on exertion 04/07/2021   Migraines 02/23/2021   Lumbar back pain 02/23/2021   HTN (hypertension) 02/23/2021   Dyspepsia 02/23/2021   DM (diabetes mellitus) (HCC) 02/23/2021   Nasal septal deviation 02/17/2021   Laryngitis 01/19/2021   Asymmetric tonsils 12/01/2020   Subacromial bursitis of right shoulder joint 05/17/2020   Hypersomnia 11/03/2015   Obesity 11/03/2015   Allergic  rhinitis 04/15/2014   Asthma in adult 04/15/2014   GERD (gastroesophageal reflux disease) 04/15/2014   IBS (irritable bowel syndrome) 04/01/2013   Nausea alone 01/15/2013   Diarrhea 01/15/2013   Depression 10/15/2012   Lesion of eyebrow 02/09/2012   Gastroparesis 07/05/2011   Anxiety 07/05/2010   TESTOSTERONE DEFICIENCY 05/26/2008   Hyperlipidemia 02/06/2008   Facet arthropathy, lumbar 02/06/2008   Atypical chest pain 02/06/2008    PCP: Esperanza Richters, PA-C   REFERRING PROVIDER: Lenda Kelp, MD   REFERRING DIAG:  54.16 (ICD-10-CM) - Lumbar radiculopathy  M75.81 (ICD-10-CM) - Rotator cuff tendinitis, right  M77.8 (ICD-10-CM) - Capsulitis of left shoulder    Rationale for Evaluation and Treatment: Rehabilitation  THERAPY DIAG:  No diagnosis found.  ONSET DATE: R leg pain several years; Shoulders - months  SUBJECTIVE:  SUBJECTIVE STATEMENT: Doing good. R leg not as bad lately.  Shoulders still bother me more , think I need more strengthening of shoulders PERTINENT HISTORY:  DM, Lumbar laminectomy L 2007  PAIN:  Are you having pain? Yes: NPRS scale: 0/10 Pain location: R anterior thigh down to knee  Are you having pain? Yes: NPRS scale: 0/10 Pain location: deltoid region Pain description: throbbing, intermittent Aggravating factors: horizontal ABD bil Relieving factors: rest  PRECAUTIONS: None  WEIGHT BEARING RESTRICTIONS: No  FALLS:  Has patient fallen in last 6 months? No  LIVING ENVIRONMENT: Lives with: lives alone Lives in: House/apartment Stairs: Yes: External: 2 flights steps; can reach both Has following equipment at home: None  OCCUPATION: Sport and exercise psychologist, works at home; has sit down and stand up desk  PLOF: Independent  PATIENT GOALS: get regular  movement back in back, be able to exercise without pain and pet my cats  NEXT MD VISIT: 06/19/22  OBJECTIVE:   DIAGNOSTIC FINDINGS:  XR Left - negative XR Right - Mild glenohumeral degenerative changes without loss of joint space.  [place] Right - thickening of the coracohumeral ligament and the deltoid ligament   PATIENT SURVEYS:  Modified Oswestry 8 / 50 = 16.0 %  Quick Dash 27.3 / 100 = 27.3 % FOTO TBD  SCREENING FOR RED FLAGS: Neg  COGNITION: Overall cognitive status: Within functional limits for tasks assessed     SENSATION: WFL  MUSCLE LENGTH: Marked tightness in bil HS, quads, hip flexors, hip rotators and lumbar  POSTURE: rounded shoulders, forward head, and tight QL with depressed R scapula; left lateral shift  PALPATION: R gluteus medius and lumbar R>L  LUMBAR ROM:   AROM eval  Flexion Limited by HS to knees  Extension 75% limited  Right lateral flexion 50% limited  Left lateral flexion 50% limited  Right rotation 25% limited  Left rotation 25% limited   (Blank rows = not tested)  LOWER EXTREMITY ROM:   marked flexibility deficits affecting full range of bil hip and knee joints  LOWER EXTREMITY MMT:  Grossly 5/5 in  B LE   LUMBAR SPECIAL TESTS:  Straight leg raise test: Positive and Slump test: Positive  SHOULDER SPECIAL TESTS: Impingement tests: Neer impingement test: negative and Hawkins/Kennedy impingement test: positive  R  Rotator cuff assessment: neg bil  UPPER EXTREMITY ROM: Functional bil shoulder ROM  Active ROM Right eval Left eval  Shoulder flexion    Shoulder extension    Shoulder abduction    Shoulder adduction    Shoulder extension    Shoulder internal rotation    Shoulder external rotation    Elbow flexion    Elbow extension    Wrist flexion    Wrist extension    Wrist ulnar deviation    Wrist radial deviation    Wrist pronation    Wrist supination     (Blank rows = not tested)  UPPER EXTREMITY MMT: Grossly 5/5 bil except  R  ER 4+5 with mild pain   TODAY'S TREATMENT:  DATE: 07/06/22: Therapeutic exercise:  bike, L 3, x 6 min Pallof press 5#, 2 x 10 Quadriped multifidus 10 x each Prone on elbows, static, then wt shifting side to side, and then alt hamstring curls 10 x each Kettle bell wing with 6# B shoulder flexion wall slides with isometric B shoulder abd in pillowcase 10 x Standing alt hip ext with blue t band around thighs Standing dead lifts 6#, 10 x 2  Jul 08, 2022 Therapeutic Exercise: Bike L3x35min Quadruped multifidus hip raise 2x10 bil Bird dog x 10 bil Deadlift 6lb both sides 2x10 Kettle bell swing 2x10 6lb Standing ER red TB 2x10 Standing horizontal ABD red TB 2x10 Pallof press w/ cable 5# 2x10   06/30/22 Therapeutic Exercise: UBE L2.0 3 min fwd/ 3 min back Squats 6lb 2x10 touch to table Deadlift with 6lb touch to table 2x10 - cues to avoid rounding back Standing ER with red TB against wall x 10 with emphasis on cervical extension Standing horiz ABD red TB against wall 2x10 with emphasis on cervical extension Standing shoulder flexion bil with thoracic ext  Bridge with blue TB 2x10 with 5 second hold Donkey kick in quadruped x 10 bil w/ blue TB Prone hip extension x10  Supine figure 4 stretch 30 seconds bil SKTC 30 sec bil Seated isometric abs with yellow pball 10x3"  06/28/22 Therapeutic Exercise: Bike L3x46min Bridge with blue TB 2x10 S/L clamshells blue TB 2x10 B Supine R quad stretch w/ strap 2x30 sec Lat pulls 35# 2x10 Seated rows 35# 2x10 Suqat with 5lb DB touch to mat table 2x10 Deadlift x 10 no weight B ER red TB x 10  Manual Therapy:  STM to R quads, more RF, VL  06/21/22 Bike L2x45min Doorway pec stretch 2x30 sec Reviewed scapular retraction Lumbar FOTO: 61.05% Bridge with TrA engagement 2x10 Alternate LE extension with TrA x 10 B Supine clam  blue TB 2x10: bent knee fallout 2nd set Supine march blue tb 2x10 Lat pulls 25# x 20  06/05/22  See pt ed and HEP   PATIENT EDUCATION:  Education details:  Person educated: Patient Education method: Explanation, Demonstration, and Handouts Education comprehension: verbalized understanding and returned demonstration  HOME EXERCISE PROGRAM: Access Code: [email or web address] URL: [email or web address] Date: 07/06/2022 Prepared by: Eulises Kijowski  Exercises - Supine Lower Trunk Rotation  - 2 x daily - 7 x weekly - 1 sets - 5 reps - 10 sec hold - Cat Cow to Child's Pose  - 1 x daily - 7 x weekly - 1-3 sets - 10 reps - Seated Scapular Retraction  - 1 x daily - 7 x weekly - 1-3 sets - 10 reps - 2-3 sec hold - Doorway Pec Stretch at 90 Degrees Abduction  - 1 x daily - 7 x weekly - 1 sets - 3 reps - 30-60 seconds hold - Supine Bridge with Resistance Band  - 1 x daily - 7 x weekly - 3 sets - 10 reps - Clamshell with Resistance  - 1 x daily - 7 x weekly - 3 sets - 10 reps - Shoulder External Rotation and Scapular Retraction with Resistance  - 1 x daily - 7 x weekly - 3 sets - 10 reps - Prone on Elbows Stretch  - 1 x daily - 7 x weekly - 3 sets - 10 reps - Hip Extension with Resistance Loop  - 1 x daily - 7 x weekly - 3 sets - 10 reps - Scapular Stability on Wall With Pillowcase (Hemiplegia)  -  1 x daily - 7 x weekly - 3 sets - 10 reps Access Code: [email or web address] URL: [email or web address] Date: 06/05/2022 Prepared by: Raynelle Fanning  Exercises - Supine Lower Trunk Rotation  - 2 x daily - 7 x weekly - 1 sets - 5 reps - 10 sec hold - Cat Cow to Child's Pose  - 1 x daily - 7 x weekly - 1-3 sets - 10 reps - Seated Scapular Retraction  - 1 x daily - 7 x weekly - 1-3 sets - 10 reps - 2-3 sec hold - Doorway Pec Stretch at 90 Degrees Abduction  - 1 x daily - 7 x weekly - 1 sets - 3 reps - 30-60 seconds hold  ASSESSMENT:  CLINICAL IMPRESSION: Advanced through strength focused exercises to improve  function. Progressed exercises as tolerated. He is flexed throughout spine and ant hips,very stiff.  Responded well worked hard with all ex.   He would benefit from continued progression of exercises to improve core stability and muscular endurance.   OBJECTIVE IMPAIRMENTS: decreased activity tolerance, decreased mobility, decreased ROM, decreased strength, increased muscle spasms, impaired flexibility, impaired UE functional use, postural dysfunction, and pain.   ACTIVITY LIMITATIONS: standing and locomotion level  PARTICIPATION LIMITATIONS:  walking for exercise  PERSONAL FACTORS: Fitness, Profession, Time since onset of injury/illness/exacerbation, and 1-2 comorbidities: DM, previous back surgery  are also affecting patient's functional outcome.   REHAB POTENTIAL: Excellent  CLINICAL DECISION MAKING: Evolving/moderate complexity  EVALUATION COMPLEXITY: Moderate   GOALS: Goals reviewed with patient? Yes  SHORT TERM GOALS: Target date: 07/03/2022   Patient will be independent with initial HEP.  Baseline:  Goal status: IN PROGRESS  2.  Patient will report ability to centralize radicular symptoms with therex.  Baseline:  Goal status: IN PROGRESS- 06/30/22   3.  Complete FOTO for back by 2nd visit Baseline:  Goal status: MET  LONG TERM GOALS: Target date: 07/31/2022   Patient will be independent with advanced/ongoing HEP to improve outcomes and carryover.  Baseline:  Goal status: IN PROGRESS  2.  Patient will report 75% improvement in low back radicular pain to improve QOL.  Baseline:  Goal status: IN PROGRESS  3.  Patient will demonstrate full/functional pain free lumbar ROM to perform ADLs.   Baseline:  Goal status: IN PROGRESS  4.  Patient will report 2/50 on Modified Oswestry to demonstrate improved functional ability.  Baseline: 8 / 50 = 16.0 % Goal status: IN PROGRESS   5.  Patient will tolerate 30 min of walking without increased pain. Baseline:  Goal status: IN  PROGRESS  6.  Patient to demonstrate ability to achieve and maintain good spinal alignment/posturing and body mechanics needed for daily activities. Baseline:  Goal status: IN PROGRESS  7.  Patient will report 75% improvement in bil shoulder pain with petting cats and reaching outward with ADLS. Baseline:  Goal status: IN PROGRESS  8  Patient will report 17 on Quick Dash to demonstrate improved functional ability.  Baseline: 27.3 / 100 = 27.3 % Goal status: IN PROGRESS  PLAN:  PT FREQUENCY: 2x/week  PT DURATION: 8 weeks  PLANNED INTERVENTIONS: Therapeutic exercises, Therapeutic activity, Neuromuscular re-education, Patient/Family education, Self Care, Joint mobilization, Aquatic Therapy, Dry Needling, Electrical stimulation, Spinal manipulation, Spinal mobilization, Cryotherapy, Moist heat, Taping, Traction, Ultrasound, Ionotophoresis 4mg /ml Dexamethasone, and Manual therapy.  PLAN FOR NEXT SESSION: review lateral shift with lateral glide on wall, Postural strengthening and core stability   Emika Tiano L Amere Bricco, PT 07/06/2022, 1:21 PM

## 2022-07-11 ENCOUNTER — Ambulatory Visit: Payer: BC Managed Care – PPO

## 2022-07-11 DIAGNOSIS — R252 Cramp and spasm: Secondary | ICD-10-CM | POA: Diagnosis not present

## 2022-07-11 DIAGNOSIS — R293 Abnormal posture: Secondary | ICD-10-CM | POA: Diagnosis not present

## 2022-07-11 DIAGNOSIS — M5416 Radiculopathy, lumbar region: Secondary | ICD-10-CM | POA: Diagnosis not present

## 2022-07-11 DIAGNOSIS — M25511 Pain in right shoulder: Secondary | ICD-10-CM | POA: Diagnosis not present

## 2022-07-11 DIAGNOSIS — M25512 Pain in left shoulder: Secondary | ICD-10-CM | POA: Diagnosis not present

## 2022-07-11 NOTE — Therapy (Signed)
OUTPATIENT PHYSICAL THERAPY THORACOLUMBAR AND SHOULDER TREATMENT   Patient Name: Anthony Russell MRN: [id number] DOB:01-16-1981, 42 y.o., male Today's Date: 07/11/2022  END OF SESSION:  PT End of Session - 07/11/22 1750     Visit Number 7    Date for PT Re-Evaluation 07/31/22    Authorization Type [employer]    PT Start Time 1703    PT Stop Time 1748    PT Time Calculation (min) 45 min    Activity Tolerance Patient tolerated treatment well    Behavior During Therapy WFL for tasks assessed/performed                 Past Medical History:  Diagnosis Date   Allergy    Allergy, unspecified not elsewhere classified    rx w/ OTC antihistamines PRN   Anxiety    Asthma    Atypical chest pain    recent neg eval w/ normal cxr/ekg   Depression    Diverticulosis    DM (diabetes mellitus) (HCC)    Dyspepsia    on protonix 40mg /d   Esophagitis    GERD (gastroesophageal reflux disease)    on protonix 40mg /d   HTN (hypertension)    Hyperlipidemia    on diet alone   IBS (irritable bowel syndrome)    Lumbar back pain    s/p lumbar laminectomy 2007 by DrCabbell   Migraines    Overweight(278.02)    weight Jan10=246#.[name].he was 225# in 9/07...diet and exercise was discussed   Sleep apnea    not wearing c-pap currently   Past Surgical History:  Procedure Laterality Date   DENTAL SURGERY     LUMBAR LAMINECTOMY  01/30/2005   DrCabbell   TEAR DUCT PROBING     Patient Active Problem List   Diagnosis Date Noted   Lumbar radiculopathy 05/10/2022   Capsulitis of left shoulder 05/10/2022   Sinus tachycardia 12/28/2021   Impingement of knee joint, right 12/20/2021   Achilles tendinitis, right leg 12/20/2021   Rotator cuff tendinitis, right 11/04/2021   Encounter for screening for other metabolic disorders 04/07/2021   Dyspnea on exertion 04/07/2021   Migraines 02/23/2021   Lumbar back pain 02/23/2021   HTN (hypertension) 02/23/2021   Dyspepsia 02/23/2021   DM (diabetes mellitus)  (HCC) 02/23/2021   Nasal septal deviation 02/17/2021   Laryngitis 01/19/2021   Asymmetric tonsils 12/01/2020   Subacromial bursitis of right shoulder joint 05/17/2020   Hypersomnia 11/03/2015   Obesity 11/03/2015   Allergic rhinitis 04/15/2014   Asthma in adult 04/15/2014   GERD (gastroesophageal reflux disease) 04/15/2014   IBS (irritable bowel syndrome) 04/01/2013   Nausea alone 01/15/2013   Diarrhea 01/15/2013   Depression 10/15/2012   Lesion of eyebrow 02/09/2012   Gastroparesis 07/05/2011   Anxiety 07/05/2010   TESTOSTERONE DEFICIENCY 05/26/2008   Hyperlipidemia 02/06/2008   Facet arthropathy, lumbar 02/06/2008   Atypical chest pain 02/06/2008    PCP: Esperanza Richters, PA-C   REFERRING PROVIDER: Lenda Kelp, MD   REFERRING DIAG:  54.16 (ICD-10-CM) - Lumbar radiculopathy  M75.81 (ICD-10-CM) - Rotator cuff tendinitis, right  M77.8 (ICD-10-CM) - Capsulitis of left shoulder    Rationale for Evaluation and Treatment: Rehabilitation  THERAPY DIAG:  Radiculopathy, lumbar region  Acute pain of both shoulders  Cramp and spasm  Abnormal posture  ONSET DATE: R leg pain several years; Shoulders - months  SUBJECTIVE:  SUBJECTIVE STATEMENT: Increased R leg pain today, going down thigh down leg  PERTINENT HISTORY:  DM, Lumbar laminectomy L 2007  PAIN:  Are you having pain? Yes: NPRS scale: 6/10 Pain location: R anterior thigh down leg  Are you having pain? Yes: NPRS scale: 0/10 Pain location: deltoid region Pain description: throbbing, intermittent Aggravating factors: horizontal ABD bil Relieving factors: rest  PRECAUTIONS: None  WEIGHT BEARING RESTRICTIONS: No  FALLS:  Has patient fallen in last 6 months? No  LIVING ENVIRONMENT: Lives with: lives alone Lives in:  House/apartment Stairs: Yes: External: 2 flights steps; can reach both Has following equipment at home: None  OCCUPATION: Sport and exercise psychologist, works at home; has sit down and stand up desk  PLOF: Independent  PATIENT GOALS: get regular movement back in back, be able to exercise without pain and pet my cats  NEXT MD VISIT: 06/19/22  OBJECTIVE:   DIAGNOSTIC FINDINGS:  XR Left - negative XR Right - Mild glenohumeral degenerative changes without loss of joint space.  [place] Right - thickening of the coracohumeral ligament and the deltoid ligament   PATIENT SURVEYS:  Modified Oswestry 8 / 50 = 16.0 %  Quick Dash 27.3 / 100 = 27.3 % FOTO TBD  SCREENING FOR RED FLAGS: Neg  COGNITION: Overall cognitive status: Within functional limits for tasks assessed     SENSATION: WFL  MUSCLE LENGTH: Marked tightness in bil HS, quads, hip flexors, hip rotators and lumbar  POSTURE: rounded shoulders, forward head, and tight QL with depressed R scapula; left lateral shift  PALPATION: R gluteus medius and lumbar R>L  LUMBAR ROM:   AROM eval  Flexion Limited by HS to knees  Extension 75% limited  Right lateral flexion 50% limited  Left lateral flexion 50% limited  Right rotation 25% limited  Left rotation 25% limited   (Blank rows = not tested)  LOWER EXTREMITY ROM:   marked flexibility deficits affecting full range of bil hip and knee joints  LOWER EXTREMITY MMT:  Grossly 5/5 in  B LE   LUMBAR SPECIAL TESTS:  Straight leg raise test: Positive and Slump test: Positive  SHOULDER SPECIAL TESTS: Impingement tests: Neer impingement test: negative and Hawkins/Kennedy impingement test: positive  R  Rotator cuff assessment: neg bil  UPPER EXTREMITY ROM: Functional bil shoulder ROM  Active ROM Right eval Left eval  Shoulder flexion    Shoulder extension    Shoulder abduction    Shoulder adduction    Shoulder extension    Shoulder internal rotation    Shoulder external rotation     Elbow flexion    Elbow extension    Wrist flexion    Wrist extension    Wrist ulnar deviation    Wrist radial deviation    Wrist pronation    Wrist supination     (Blank rows = not tested)  UPPER EXTREMITY MMT: Grossly 5/5 bil except R  ER 4+5 with mild pain   TODAY'S TREATMENT:  DATE: 07/11/22 Bike L2x68min Prone on elbows 3x with long holds Prone knee bends bil x 10  Prone hip extension x 10 bil Ys and Ts standing against the wall x 10 2# bil Wall slide with staggered stance for hip flexor stretch x 10 bil Supine R figure 4 and KTOS stretch 2x30 seconds  Supine trunk rotation with feet on orange pball x 10  Supine bridge with feet on ornage pball x 10  Supine hamstring stretch 3x30 seconds  Manual Therapy: to decrease muscle spasm, pain and improve mobility.  Manual R hip distraction in supine long leg distraction  07/06/22: Therapeutic exercise:  bike, L 3, x 6 min Pallof press 5#, 2 x 10 Quadriped multifidus 10 x each Prone on elbows, static, then wt shifting side to side, and then alt hamstring curls 10 x each Kettle bell wing with 6# B shoulder flexion wall slides with isometric B shoulder abd in pillowcase 10 x Standing alt hip ext with blue t band around thighs Standing dead lifts 6#, 10 x 2  08-Jul-2022 Therapeutic Exercise: Bike L3x9min Quadruped multifidus hip raise 2x10 bil Bird dog x 10 bil Deadlift 6lb both sides 2x10 Kettle bell swing 2x10 6lb Standing ER red TB 2x10 Standing horizontal ABD red TB 2x10 Pallof press w/ cable 5# 2x10   06/30/22 Therapeutic Exercise: UBE L2.0 3 min fwd/ 3 min back Squats 6lb 2x10 touch to table Deadlift with 6lb touch to table 2x10 - cues to avoid rounding back Standing ER with red TB against wall x 10 with emphasis on cervical extension Standing horiz ABD red TB against wall 2x10 with emphasis on  cervical extension Standing shoulder flexion bil with thoracic ext  Bridge with blue TB 2x10 with 5 second hold Donkey kick in quadruped x 10 bil w/ blue TB Prone hip extension x10  Supine figure 4 stretch 30 seconds bil SKTC 30 sec bil Seated isometric abs with yellow pball 10x3"  06/28/22 Therapeutic Exercise: Bike L3x9min Bridge with blue TB 2x10 S/L clamshells blue TB 2x10 B Supine R quad stretch w/ strap 2x30 sec Lat pulls 35# 2x10 Seated rows 35# 2x10 Suqat with 5lb DB touch to mat table 2x10 Deadlift x 10 no weight B ER red TB x 10  Manual Therapy:  STM to R quads, more RF, VL  06/21/22 Bike L2x49min Doorway pec stretch 2x30 sec Reviewed scapular retraction Lumbar FOTO: 61.05% Bridge with TrA engagement 2x10 Alternate LE extension with TrA x 10 B Supine clam blue TB 2x10: bent knee fallout 2nd set Supine march blue tb 2x10 Lat pulls 25# x 20  06/05/22  See pt ed and HEP   PATIENT EDUCATION:  Education details:  Person educated: Patient Education method: Explanation, Demonstration, and Handouts Education comprehension: verbalized understanding and returned demonstration  HOME EXERCISE PROGRAM: Access Code: [email or web address] URL: [email or web address] Date: 07/06/2022 Prepared by: Amy Speaks  Exercises - Supine Lower Trunk Rotation  - 2 x daily - 7 x weekly - 1 sets - 5 reps - 10 sec hold - Cat Cow to Child's Pose  - 1 x daily - 7 x weekly - 1-3 sets - 10 reps - Seated Scapular Retraction  - 1 x daily - 7 x weekly - 1-3 sets - 10 reps - 2-3 sec hold - Doorway Pec Stretch at 90 Degrees Abduction  - 1 x daily - 7 x weekly - 1 sets - 3 reps - 30-60 seconds hold - Supine Bridge with Resistance Band  - 1 x  daily - 7 x weekly - 3 sets - 10 reps - Clamshell with Resistance  - 1 x daily - 7 x weekly - 3 sets - 10 reps - Shoulder External Rotation and Scapular Retraction with Resistance  - 1 x daily - 7 x weekly - 3 sets - 10 reps - Prone on Elbows Stretch  - 1 x  daily - 7 x weekly - 3 sets - 10 reps - Hip Extension with Resistance Loop  - 1 x daily - 7 x weekly - 3 sets - 10 reps - Scapular Stability on Wall With Pillowcase (Hemiplegia)  - 1 x daily - 7 x weekly - 3 sets - 10 reps Access Code: [email or web address] URL: [email or web address] Date: 06/05/2022 Prepared by: Raynelle Fanning  Exercises - Supine Lower Trunk Rotation  - 2 x daily - 7 x weekly - 1 sets - 5 reps - 10 sec hold - Cat Cow to Child's Pose  - 1 x daily - 7 x weekly - 1-3 sets - 10 reps - Seated Scapular Retraction  - 1 x daily - 7 x weekly - 1-3 sets - 10 reps - 2-3 sec hold - Doorway Pec Stretch at 90 Degrees Abduction  - 1 x daily - 7 x weekly - 1 sets - 3 reps - 30-60 seconds hold  ASSESSMENT:  CLINICAL IMPRESSION: Pt had increased pain and tingling down the RLE. Exercises focused on postural alignment along with Extension biased exercise for nerve tension. By the end of the session his R LE pain went from 6/10 to 1/10. He is very tight in his R hamstrings, so added hamstring stretch to HEP. Good response to the manual hip distraction but only temporary relief today.  OBJECTIVE IMPAIRMENTS: decreased activity tolerance, decreased mobility, decreased ROM, decreased strength, increased muscle spasms, impaired flexibility, impaired UE functional use, postural dysfunction, and pain.   ACTIVITY LIMITATIONS: standing and locomotion level  PARTICIPATION LIMITATIONS:  walking for exercise  PERSONAL FACTORS: Fitness, Profession, Time since onset of injury/illness/exacerbation, and 1-2 comorbidities: DM, previous back surgery  are also affecting patient's functional outcome.   REHAB POTENTIAL: Excellent  CLINICAL DECISION MAKING: Evolving/moderate complexity  EVALUATION COMPLEXITY: Moderate   GOALS: Goals reviewed with patient? Yes  SHORT TERM GOALS: Target date: 07/03/2022   Patient will be independent with initial HEP.  Baseline:  Goal status: MET  2.  Patient will report  ability to centralize radicular symptoms with therex.  Baseline:  Goal status: IN PROGRESS- 06/30/22   3.  Complete FOTO for back by 2nd visit Baseline:  Goal status: MET  LONG TERM GOALS: Target date: 07/31/2022   Patient will be independent with advanced/ongoing HEP to improve outcomes and carryover.  Baseline:  Goal status: IN PROGRESS  2.  Patient will report 75% improvement in low back radicular pain to improve QOL.  Baseline:  Goal status: IN PROGRESS  3.  Patient will demonstrate full/functional pain free lumbar ROM to perform ADLs.   Baseline:  Goal status: IN PROGRESS  4.  Patient will report 2/50 on Modified Oswestry to demonstrate improved functional ability.  Baseline: 8 / 50 = 16.0 % Goal status: IN PROGRESS   5.  Patient will tolerate 30 min of walking without increased pain. Baseline:  Goal status: IN PROGRESS  6.  Patient to demonstrate ability to achieve and maintain good spinal alignment/posturing and body mechanics needed for daily activities. Baseline:  Goal status: IN PROGRESS  7.  Patient will report 75% improvement in bil shoulder pain  with petting cats and reaching outward with ADLS. Baseline:  Goal status: IN PROGRESS  8  Patient will report 17 on Quick Dash to demonstrate improved functional ability.  Baseline: 27.3 / 100 = 27.3 % Goal status: IN PROGRESS  PLAN:  PT FREQUENCY: 2x/week  PT DURATION: 8 weeks  PLANNED INTERVENTIONS: Therapeutic exercises, Therapeutic activity, Neuromuscular re-education, Patient/Family education, Self Care, Joint mobilization, Aquatic Therapy, Dry Needling, Electrical stimulation, Spinal manipulation, Spinal mobilization, Cryotherapy, Moist heat, Taping, Traction, Ultrasound, Ionotophoresis 4mg /ml Dexamethasone, and Manual therapy.  PLAN FOR NEXT SESSION: extension exercises; HS stretcing; review lateral shift with lateral glide on wall, Postural strengthening and core stability   Darleene Cleaver,  PTA 07/11/2022, 5:52 PM

## 2022-07-17 ENCOUNTER — Ambulatory Visit: Payer: BC Managed Care – PPO

## 2022-07-17 DIAGNOSIS — M25511 Pain in right shoulder: Secondary | ICD-10-CM

## 2022-07-17 DIAGNOSIS — R293 Abnormal posture: Secondary | ICD-10-CM

## 2022-07-17 DIAGNOSIS — R252 Cramp and spasm: Secondary | ICD-10-CM | POA: Diagnosis not present

## 2022-07-17 DIAGNOSIS — M5416 Radiculopathy, lumbar region: Secondary | ICD-10-CM | POA: Diagnosis not present

## 2022-07-17 DIAGNOSIS — M25512 Pain in left shoulder: Secondary | ICD-10-CM | POA: Diagnosis not present

## 2022-07-17 NOTE — Therapy (Signed)
OUTPATIENT PHYSICAL THERAPY THORACOLUMBAR AND SHOULDER TREATMENT   Patient Name: Anthony Russell MRN: [id number] DOB:04-Dec-1980, 42 y.o., male Today's Date: 07/17/2022  END OF SESSION:  PT End of Session - 07/17/22 1018     Visit Number 8    Date for PT Re-Evaluation 07/31/22    Authorization Type [employer]    PT Start Time 1016    PT Stop Time 1055    PT Time Calculation (min) 39 min    Activity Tolerance Patient tolerated treatment well    Behavior During Therapy WFL for tasks assessed/performed                 Past Medical History:  Diagnosis Date   Allergy    Allergy, unspecified not elsewhere classified    rx w/ OTC antihistamines PRN   Anxiety    Asthma    Atypical chest pain    recent neg eval w/ normal cxr/ekg   Depression    Diverticulosis    DM (diabetes mellitus) (HCC)    Dyspepsia    on protonix 40mg /d   Esophagitis    GERD (gastroesophageal reflux disease)    on protonix 40mg /d   HTN (hypertension)    Hyperlipidemia    on diet alone   IBS (irritable bowel syndrome)    Lumbar back pain    s/p lumbar laminectomy 2007 by DrCabbell   Migraines    Overweight(278.02)    weight Jan10=246#.[name].he was 225# in 9/07...diet and exercise was discussed   Sleep apnea    not wearing c-pap currently   Past Surgical History:  Procedure Laterality Date   DENTAL SURGERY     LUMBAR LAMINECTOMY  01/30/2005   DrCabbell   TEAR DUCT PROBING     Patient Active Problem List   Diagnosis Date Noted   Lumbar radiculopathy 05/10/2022   Capsulitis of left shoulder 05/10/2022   Sinus tachycardia 12/28/2021   Impingement of knee joint, right 12/20/2021   Achilles tendinitis, right leg 12/20/2021   Rotator cuff tendinitis, right 11/04/2021   Encounter for screening for other metabolic disorders 04/07/2021   Dyspnea on exertion 04/07/2021   Migraines 02/23/2021   Lumbar back pain 02/23/2021   HTN (hypertension) 02/23/2021   Dyspepsia 02/23/2021   DM (diabetes mellitus)  (HCC) 02/23/2021   Nasal septal deviation 02/17/2021   Laryngitis 01/19/2021   Asymmetric tonsils 12/01/2020   Subacromial bursitis of right shoulder joint 05/17/2020   Hypersomnia 11/03/2015   Obesity 11/03/2015   Allergic rhinitis 04/15/2014   Asthma in adult 04/15/2014   GERD (gastroesophageal reflux disease) 04/15/2014   IBS (irritable bowel syndrome) 04/01/2013   Nausea alone 01/15/2013   Diarrhea 01/15/2013   Depression 10/15/2012   Lesion of eyebrow 02/09/2012   Gastroparesis 07/05/2011   Anxiety 07/05/2010   TESTOSTERONE DEFICIENCY 05/26/2008   Hyperlipidemia 02/06/2008   Facet arthropathy, lumbar 02/06/2008   Atypical chest pain 02/06/2008    PCP: Esperanza Richters, PA-C   REFERRING PROVIDER: Lenda Kelp, MD   REFERRING DIAG:  54.16 (ICD-10-CM) - Lumbar radiculopathy  M75.81 (ICD-10-CM) - Rotator cuff tendinitis, right  M77.8 (ICD-10-CM) - Capsulitis of left shoulder    Rationale for Evaluation and Treatment: Rehabilitation  THERAPY DIAG:  Radiculopathy, lumbar region  Acute pain of both shoulders  Cramp and spasm  Abnormal posture  ONSET DATE: R leg pain several years; Shoulders - months  SUBJECTIVE:  SUBJECTIVE STATEMENT: Noticing some pain when lifting leg to put on pants.   PERTINENT HISTORY:  DM, Lumbar laminectomy L 2007  PAIN:  Are you having pain? No  Are you having pain? Yes: NPRS scale: 0/10 Pain location: deltoid region Pain description: throbbing, intermittent Aggravating factors: horizontal ABD bil Relieving factors: rest  PRECAUTIONS: None  WEIGHT BEARING RESTRICTIONS: No  FALLS:  Has patient fallen in last 6 months? No  LIVING ENVIRONMENT: Lives with: lives alone Lives in: House/apartment Stairs: Yes: External: 2 flights steps; can  reach both Has following equipment at home: None  OCCUPATION: Sport and exercise psychologist, works at home; has sit down and stand up desk  PLOF: Independent  PATIENT GOALS: get regular movement back in back, be able to exercise without pain and pet my cats  NEXT MD VISIT: 06/19/22  OBJECTIVE:   DIAGNOSTIC FINDINGS:  XR Left - negative XR Right - Mild glenohumeral degenerative changes without loss of joint space.  [place] Right - thickening of the coracohumeral ligament and the deltoid ligament   PATIENT SURVEYS:  Modified Oswestry 8 / 50 = 16.0 %  Quick Dash 27.3 / 100 = 27.3 % FOTO TBD  SCREENING FOR RED FLAGS: Neg  COGNITION: Overall cognitive status: Within functional limits for tasks assessed     SENSATION: WFL  MUSCLE LENGTH: Marked tightness in bil HS, quads, hip flexors, hip rotators and lumbar  POSTURE: rounded shoulders, forward head, and tight QL with depressed R scapula; left lateral shift  PALPATION: R gluteus medius and lumbar R>L  LUMBAR ROM:   AROM eval  Flexion Limited by HS to knees  Extension 75% limited  Right lateral flexion 50% limited  Left lateral flexion 50% limited  Right rotation 25% limited  Left rotation 25% limited   (Blank rows = not tested)  LOWER EXTREMITY ROM:   marked flexibility deficits affecting full range of bil hip and knee joints  LOWER EXTREMITY MMT:  Grossly 5/5 in  B LE   LUMBAR SPECIAL TESTS:  Straight leg raise test: Positive and Slump test: Positive  SHOULDER SPECIAL TESTS: Impingement tests: Neer impingement test: negative and Hawkins/Kennedy impingement test: positive  R  Rotator cuff assessment: neg bil  UPPER EXTREMITY ROM: Functional bil shoulder ROM  Active ROM Right eval Left eval  Shoulder flexion    Shoulder extension    Shoulder abduction    Shoulder adduction    Shoulder extension    Shoulder internal rotation    Shoulder external rotation    Elbow flexion    Elbow extension    Wrist flexion    Wrist  extension    Wrist ulnar deviation    Wrist radial deviation    Wrist pronation    Wrist supination     (Blank rows = not tested)  UPPER EXTREMITY MMT: Grossly 5/5 bil except R  ER 4+5 with mild pain   TODAY'S TREATMENT:  DATE: 07/17/22 Therapeutic Exercise: to improve strength and mobility.  Demo, verbal and tactile cues throughout for technique.  Bike L2x81min Lat pulls 35# 2x10 Rows 35# 2x10 Wall angels against wall w/ towel behind head 2x10 Wall slides into scaption bil 2x10 Prone on elbows 2x10 Prone hip extension x 10 bil Quadruped x 10 LE extension bil Quadruped x 10 arm raise Bird dog x 10 bil ER and horizontal ABD w/ green TB 2x10  Manual Therapy: to decrease muscle spasm, pain and improve mobility IASTM w/ foam roll to thoracolumbar PS in prone    07/11/22 Bike L2x31min Prone on elbows 3x with long holds Prone knee bends bil x 10  Prone hip extension x 10 bil Ys and Ts standing against the wall x 10 2# bil Wall slide with staggered stance for hip flexor stretch x 10 bil Supine R figure 4 and KTOS stretch 2x30 seconds  Supine trunk rotation with feet on orange pball x 10  Supine bridge with feet on ornage pball x 10  Supine hamstring stretch 3x30 seconds  Manual Therapy: to decrease muscle spasm, pain and improve mobility.  Manual R hip distraction in supine long leg distraction  07/06/22: Therapeutic exercise:  bike, L 3, x 6 min Pallof press 5#, 2 x 10 Quadriped multifidus 10 x each Prone on elbows, static, then wt shifting side to side, and then alt hamstring curls 10 x each Kettle bell wing with 6# B shoulder flexion wall slides with isometric B shoulder abd in pillowcase 10 x Standing alt hip ext with blue t band around thighs Standing dead lifts 6#, 10 x 2  07-23-22 Therapeutic Exercise: Bike L3x30min Quadruped multifidus hip raise  2x10 bil Bird dog x 10 bil Deadlift 6lb both sides 2x10 Kettle bell swing 2x10 6lb Standing ER red TB 2x10 Standing horizontal ABD red TB 2x10 Pallof press w/ cable 5# 2x10   06/30/22 Therapeutic Exercise: UBE L2.0 3 min fwd/ 3 min back Squats 6lb 2x10 touch to table Deadlift with 6lb touch to table 2x10 - cues to avoid rounding back Standing ER with red TB against wall x 10 with emphasis on cervical extension Standing horiz ABD red TB against wall 2x10 with emphasis on cervical extension Standing shoulder flexion bil with thoracic ext  Bridge with blue TB 2x10 with 5 second hold Donkey kick in quadruped x 10 bil w/ blue TB Prone hip extension x10  Supine figure 4 stretch 30 seconds bil SKTC 30 sec bil Seated isometric abs with yellow pball 10x3"  06/28/22 Therapeutic Exercise: Bike L3x53min Bridge with blue TB 2x10 S/L clamshells blue TB 2x10 B Supine R quad stretch w/ strap 2x30 sec Lat pulls 35# 2x10 Seated rows 35# 2x10 Suqat with 5lb DB touch to mat table 2x10 Deadlift x 10 no weight B ER red TB x 10  Manual Therapy:  STM to R quads, more RF, VL  06/21/22 Bike L2x72min Doorway pec stretch 2x30 sec Reviewed scapular retraction Lumbar FOTO: 61.05% Bridge with TrA engagement 2x10 Alternate LE extension with TrA x 10 B Supine clam blue TB 2x10: bent knee fallout 2nd set Supine march blue tb 2x10 Lat pulls 25# x 20  06/05/22  See pt ed and HEP   PATIENT EDUCATION:  Education details:  Person educated: Patient Education method: Explanation, Demonstration, and Handouts Education comprehension: verbalized understanding and returned demonstration  HOME EXERCISE PROGRAM: Access Code: [email or web address] URL: [email or web address] Date: 07/06/2022 Prepared by: Caralee Ates  Exercises - Supine Lower Trunk Rotation  -  2 x daily - 7 x weekly - 1 sets - 5 reps - 10 sec hold - Cat Cow to Child's Pose  - 1 x daily - 7 x weekly - 1-3 sets - 10 reps - Seated Scapular  Retraction  - 1 x daily - 7 x weekly - 1-3 sets - 10 reps - 2-3 sec hold - Doorway Pec Stretch at 90 Degrees Abduction  - 1 x daily - 7 x weekly - 1 sets - 3 reps - 30-60 seconds hold - Supine Bridge with Resistance Band  - 1 x daily - 7 x weekly - 3 sets - 10 reps - Clamshell with Resistance  - 1 x daily - 7 x weekly - 3 sets - 10 reps - Shoulder External Rotation and Scapular Retraction with Resistance  - 1 x daily - 7 x weekly - 3 sets - 10 reps - Prone on Elbows Stretch  - 1 x daily - 7 x weekly - 3 sets - 10 reps - Hip Extension with Resistance Loop  - 1 x daily - 7 x weekly - 3 sets - 10 reps - Scapular Stability on Wall With Pillowcase (Hemiplegia)  - 1 x daily - 7 x weekly - 3 sets - 10 reps Access Code: [email or web address] URL: [email or web address] Date: 06/05/2022 Prepared by: Raynelle Fanning  Exercises - Supine Lower Trunk Rotation  - 2 x daily - 7 x weekly - 1 sets - 5 reps - 10 sec hold - Cat Cow to Child's Pose  - 1 x daily - 7 x weekly - 1-3 sets - 10 reps - Seated Scapular Retraction  - 1 x daily - 7 x weekly - 1-3 sets - 10 reps - 2-3 sec hold - Doorway Pec Stretch at 90 Degrees Abduction  - 1 x daily - 7 x weekly - 1 sets - 3 reps - 30-60 seconds hold  ASSESSMENT:  CLINICAL IMPRESSION: Continued with progression of postural exercises to improve spinal mobility and strength to hold upright positions. He continues to have trouble with upright posture w/o cues and is very stiff when trying to do so. He does show some fatigue in the shoulders after  exercises. Walking tolerance has improved per patient, progressing toward goals.   OBJECTIVE IMPAIRMENTS: decreased activity tolerance, decreased mobility, decreased ROM, decreased strength, increased muscle spasms, impaired flexibility, impaired UE functional use, postural dysfunction, and pain.   ACTIVITY LIMITATIONS: standing and locomotion level  PARTICIPATION LIMITATIONS:  walking for exercise  PERSONAL FACTORS: Fitness,  Profession, Time since onset of injury/illness/exacerbation, and 1-2 comorbidities: DM, previous back surgery  are also affecting patient's functional outcome.   REHAB POTENTIAL: Excellent  CLINICAL DECISION MAKING: Evolving/moderate complexity  EVALUATION COMPLEXITY: Moderate   GOALS: Goals reviewed with patient? Yes  SHORT TERM GOALS: Target date: 07/03/2022   Patient will be independent with initial HEP.  Baseline:  Goal status: MET  2.  Patient will report ability to centralize radicular symptoms with therex.  Baseline:  Goal status: IN PROGRESS- 06/30/22   3.  Complete FOTO for back by 2nd visit Baseline:  Goal status: MET  LONG TERM GOALS: Target date: 07/31/2022   Patient will be independent with advanced/ongoing HEP to improve outcomes and carryover.  Baseline:  Goal status: IN PROGRESS  2.  Patient will report 75% improvement in low back radicular pain to improve QOL.  Baseline:  Goal status: IN PROGRESS  3.  Patient will demonstrate full/functional pain free lumbar ROM to perform ADLs.   Baseline:  Goal status: IN PROGRESS  4.  Patient will report 2/50 on Modified Oswestry to demonstrate improved functional ability.  Baseline: 8 / 50 = 16.0 % Goal status: IN PROGRESS   5.  Patient will tolerate 30 min of walking without increased pain. Baseline:  Goal status: IN PROGRESS- 07/17/22 11-12 min  6.  Patient to demonstrate ability to achieve and maintain good spinal alignment/posturing and body mechanics needed for daily activities. Baseline:  Goal status: IN PROGRESS  7.  Patient will report 75% improvement in bil shoulder pain with petting cats and reaching outward with ADLS. Baseline:  Goal status: IN PROGRESS  8  Patient will report 17 on Quick Dash to demonstrate improved functional ability.  Baseline: 27.3 / 100 = 27.3 % Goal status: IN PROGRESS  PLAN:  PT FREQUENCY: 2x/week  PT DURATION: 8 weeks  PLANNED INTERVENTIONS: Therapeutic exercises,  Therapeutic activity, Neuromuscular re-education, Patient/Family education, Self Care, Joint mobilization, Aquatic Therapy, Dry Needling, Electrical stimulation, Spinal manipulation, Spinal mobilization, Cryotherapy, Moist heat, Taping, Traction, Ultrasound, Ionotophoresis 4mg /ml Dexamethasone, and Manual therapy.  PLAN FOR NEXT SESSION: extension exercises; HS stretcing; review lateral shift with lateral glide on wall, Postural strengthening and core stability   Darleene Cleaver, PTA 07/17/2022, 11:08 AM

## 2022-07-20 ENCOUNTER — Ambulatory Visit: Payer: BC Managed Care – PPO

## 2022-07-20 DIAGNOSIS — M5416 Radiculopathy, lumbar region: Secondary | ICD-10-CM

## 2022-07-20 DIAGNOSIS — R252 Cramp and spasm: Secondary | ICD-10-CM

## 2022-07-20 DIAGNOSIS — M25512 Pain in left shoulder: Secondary | ICD-10-CM | POA: Diagnosis not present

## 2022-07-20 DIAGNOSIS — R293 Abnormal posture: Secondary | ICD-10-CM

## 2022-07-20 DIAGNOSIS — M25511 Pain in right shoulder: Secondary | ICD-10-CM | POA: Diagnosis not present

## 2022-07-20 NOTE — Therapy (Signed)
OUTPATIENT PHYSICAL THERAPY THORACOLUMBAR AND SHOULDER TREATMENT   Patient Name: Anthony Russell MRN: [id number] DOB:07/04/1980, 42 y.o., male Today's Date: 07/20/2022  END OF SESSION:  PT End of Session - 07/20/22 0848     Visit Number 9    Date for PT Re-Evaluation 07/31/22    Authorization Type [employer]    PT Start Time [phone]    PT Stop Time 0931    PT Time Calculation (min) 45 min    Activity Tolerance Patient tolerated treatment well    Behavior During Therapy WFL for tasks assessed/performed                 Past Medical History:  Diagnosis Date   Allergy    Allergy, unspecified not elsewhere classified    rx w/ OTC antihistamines PRN   Anxiety    Asthma    Atypical chest pain    recent neg eval w/ normal cxr/ekg   Depression    Diverticulosis    DM (diabetes mellitus) (HCC)    Dyspepsia    on protonix 40mg /d   Esophagitis    GERD (gastroesophageal reflux disease)    on protonix 40mg /d   HTN (hypertension)    Hyperlipidemia    on diet alone   IBS (irritable bowel syndrome)    Lumbar back pain    s/p lumbar laminectomy 2007 by DrCabbell   Migraines    Overweight(278.02)    weight Jan10=246#.[name].he was 225# in 9/07...diet and exercise was discussed   Sleep apnea    not wearing c-pap currently   Past Surgical History:  Procedure Laterality Date   DENTAL SURGERY     LUMBAR LAMINECTOMY  01/30/2005   DrCabbell   TEAR DUCT PROBING     Patient Active Problem List   Diagnosis Date Noted   Lumbar radiculopathy 05/10/2022   Capsulitis of left shoulder 05/10/2022   Sinus tachycardia 12/28/2021   Impingement of knee joint, right 12/20/2021   Achilles tendinitis, right leg 12/20/2021   Rotator cuff tendinitis, right 11/04/2021   Encounter for screening for other metabolic disorders 04/07/2021   Dyspnea on exertion 04/07/2021   Migraines 02/23/2021   Lumbar back pain 02/23/2021   HTN (hypertension) 02/23/2021   Dyspepsia 02/23/2021   DM (diabetes mellitus)  (HCC) 02/23/2021   Nasal septal deviation 02/17/2021   Laryngitis 01/19/2021   Asymmetric tonsils 12/01/2020   Subacromial bursitis of right shoulder joint 05/17/2020   Hypersomnia 11/03/2015   Obesity 11/03/2015   Allergic rhinitis 04/15/2014   Asthma in adult 04/15/2014   GERD (gastroesophageal reflux disease) 04/15/2014   IBS (irritable bowel syndrome) 04/01/2013   Nausea alone 01/15/2013   Diarrhea 01/15/2013   Depression 10/15/2012   Lesion of eyebrow 02/09/2012   Gastroparesis 07/05/2011   Anxiety 07/05/2010   TESTOSTERONE DEFICIENCY 05/26/2008   Hyperlipidemia 02/06/2008   Facet arthropathy, lumbar 02/06/2008   Atypical chest pain 02/06/2008    PCP: Esperanza Richters, PA-C   REFERRING PROVIDER: Lenda Kelp, MD   REFERRING DIAG:  54.16 (ICD-10-CM) - Lumbar radiculopathy  M75.81 (ICD-10-CM) - Rotator cuff tendinitis, right  M77.8 (ICD-10-CM) - Capsulitis of left shoulder    Rationale for Evaluation and Treatment: Rehabilitation  THERAPY DIAG:  Radiculopathy, lumbar region  Acute pain of both shoulders  Cramp and spasm  Abnormal posture  ONSET DATE: R leg pain several years; Shoulders - months  SUBJECTIVE:  SUBJECTIVE STATEMENT: Pt notes R thigh bothering him this morning, "it's normally like this."   PERTINENT HISTORY:  DM, Lumbar laminectomy L 2007  PAIN:  Are you having pain? Yes: NPRS scale: 3/10 Pain location: R thigh Pain description: irritating not debilitating  Are you having pain? Yes: NPRS scale: 0/10 Pain location: deltoid region Pain description: throbbing, intermittent Aggravating factors: horizontal ABD bil Relieving factors: rest  PRECAUTIONS: None  WEIGHT BEARING RESTRICTIONS: No  FALLS:  Has patient fallen in last 6 months? No  LIVING  ENVIRONMENT: Lives with: lives alone Lives in: House/apartment Stairs: Yes: External: 2 flights steps; can reach both Has following equipment at home: None  OCCUPATION: Sport and exercise psychologist, works at home; has sit down and stand up desk  PLOF: Independent  PATIENT GOALS: get regular movement back in back, be able to exercise without pain and pet my cats  NEXT MD VISIT: 06/19/22  OBJECTIVE:   DIAGNOSTIC FINDINGS:  XR Left - negative XR Right - Mild glenohumeral degenerative changes without loss of joint space.  [place] Right - thickening of the coracohumeral ligament and the deltoid ligament   PATIENT SURVEYS:  Modified Oswestry 8 / 50 = 16.0 %  Quick Dash 27.3 / 100 = 27.3 % FOTO TBD  SCREENING FOR RED FLAGS: Neg  COGNITION: Overall cognitive status: Within functional limits for tasks assessed     SENSATION: WFL  MUSCLE LENGTH: Marked tightness in bil HS, quads, hip flexors, hip rotators and lumbar  POSTURE: rounded shoulders, forward head, and tight QL with depressed R scapula; left lateral shift  PALPATION: R gluteus medius and lumbar R>L  LUMBAR ROM:   AROM eval 07/20/22  Flexion Limited by HS to knees Right below knees  Extension 75% limited 60% limit- pain  Right lateral flexion 50% limited 40% limit  Left lateral flexion 50% limited 40% limit  Right rotation 25% limited 25% limit  Left rotation 25% limited 25% limit   (Blank rows = not tested)  LOWER EXTREMITY ROM:   marked flexibility deficits affecting full range of bil hip and knee joints  LOWER EXTREMITY MMT:  Grossly 5/5 in  B LE   LUMBAR SPECIAL TESTS:  Straight leg raise test: Positive and Slump test: Positive  SHOULDER SPECIAL TESTS: Impingement tests: Neer impingement test: negative and Hawkins/Kennedy impingement test: positive  R  Rotator cuff assessment: neg bil  UPPER EXTREMITY ROM: Functional bil shoulder ROM  Active ROM Right eval Left eval  Shoulder flexion    Shoulder extension     Shoulder abduction    Shoulder adduction    Shoulder extension    Shoulder internal rotation    Shoulder external rotation    Elbow flexion    Elbow extension    Wrist flexion    Wrist extension    Wrist ulnar deviation    Wrist radial deviation    Wrist pronation    Wrist supination     (Blank rows = not tested)  UPPER EXTREMITY MMT: Grossly 5/5 bil except R  ER 4+5 with mild pain   TODAY'S TREATMENT:  DATE: 07/20/22 Therapeutic Exercise: to improve strength and mobility.  Demo, verbal and tactile cues throughout for technique.  Bike L3x80min Assess lumbar AROM Wall angels 2x10 Lumbar extension standing at wall x 10  Wall push ups 2x10 Standing hip extension 2x10 at wall  Staggered stance wall slide into flexion x 10  Seated hamstring stretch 2x30 sec leg propped on mat table Prone on elbows with neck extension x 10  Prone quad stretch 2x30 sec w/ strap  Manual Therapy: to decrease muscle spasm, pain and improve mobility IASTM w/ foam roll to thoracolumbar PS in prone  07/17/22 Therapeutic Exercise: to improve strength and mobility.  Demo, verbal and tactile cues throughout for technique.  Bike L2x92min Lat pulls 35# 2x10 Rows 35# 2x10 Wall angels against wall w/ towel behind head 2x10 Wall slides into scaption bil 2x10 Prone on elbows 2x10 Prone hip extension x 10 bil Quadruped x 10 LE extension bil Quadruped x 10 arm raise Bird dog x 10 bil ER and horizontal ABD w/ green TB 2x10  Manual Therapy: to decrease muscle spasm, pain and improve mobility IASTM w/ foam roll to thoracolumbar PS in prone    07/11/22 Bike L2x46min Prone on elbows 3x with long holds Prone knee bends bil x 10  Prone hip extension x 10 bil Ys and Ts standing against the wall x 10 2# bil Wall slide with staggered stance for hip flexor stretch x 10 bil Supine R figure  4 and KTOS stretch 2x30 seconds  Supine trunk rotation with feet on orange pball x 10  Supine bridge with feet on ornage pball x 10  Supine hamstring stretch 3x30 seconds  Manual Therapy: to decrease muscle spasm, pain and improve mobility.  Manual R hip distraction in supine long leg distraction  07/06/22: Therapeutic exercise:  bike, L 3, x 6 min Pallof press 5#, 2 x 10 Quadriped multifidus 10 x each Prone on elbows, static, then wt shifting side to side, and then alt hamstring curls 10 x each Kettle bell wing with 6# B shoulder flexion wall slides with isometric B shoulder abd in pillowcase 10 x Standing alt hip ext with blue t band around thighs Standing dead lifts 6#, 10 x 2  08/02/22 Therapeutic Exercise: Bike L3x2min Quadruped multifidus hip raise 2x10 bil Bird dog x 10 bil Deadlift 6lb both sides 2x10 Kettle bell swing 2x10 6lb Standing ER red TB 2x10 Standing horizontal ABD red TB 2x10 Pallof press w/ cable 5# 2x10   06/30/22 Therapeutic Exercise: UBE L2.0 3 min fwd/ 3 min back Squats 6lb 2x10 touch to table Deadlift with 6lb touch to table 2x10 - cues to avoid rounding back Standing ER with red TB against wall x 10 with emphasis on cervical extension Standing horiz ABD red TB against wall 2x10 with emphasis on cervical extension Standing shoulder flexion bil with thoracic ext  Bridge with blue TB 2x10 with 5 second hold Donkey kick in quadruped x 10 bil w/ blue TB Prone hip extension x10  Supine figure 4 stretch 30 seconds bil SKTC 30 sec bil Seated isometric abs with yellow pball 10x3"  06/28/22 Therapeutic Exercise: Bike L3x34min Bridge with blue TB 2x10 S/L clamshells blue TB 2x10 B Supine R quad stretch w/ strap 2x30 sec Lat pulls 35# 2x10 Seated rows 35# 2x10 Suqat with 5lb DB touch to mat table 2x10 Deadlift x 10 no weight B ER red TB x 10  Manual Therapy:  STM to R quads, more RF, VL  06/21/22  Bike L2x8min Doorway pec stretch 2x30 sec Reviewed  scapular retraction Lumbar FOTO: 61.05% Bridge with TrA engagement 2x10 Alternate LE extension with TrA x 10 B Supine clam blue TB 2x10: bent knee fallout 2nd set Supine march blue tb 2x10 Lat pulls 25# x 20  06/05/22  See pt ed and HEP   PATIENT EDUCATION:  Education details:  Person educated: Patient Education method: Explanation, Demonstration, and Handouts Education comprehension: verbalized understanding and returned demonstration  HOME EXERCISE PROGRAM: Access Code: [email or web address] URL: [email or web address] Date: 07/06/2022 Prepared by: Amy Speaks  Exercises - Supine Lower Trunk Rotation  - 2 x daily - 7 x weekly - 1 sets - 5 reps - 10 sec hold - Cat Cow to Child's Pose  - 1 x daily - 7 x weekly - 1-3 sets - 10 reps - Seated Scapular Retraction  - 1 x daily - 7 x weekly - 1-3 sets - 10 reps - 2-3 sec hold - Doorway Pec Stretch at 90 Degrees Abduction  - 1 x daily - 7 x weekly - 1 sets - 3 reps - 30-60 seconds hold - Supine Bridge with Resistance Band  - 1 x daily - 7 x weekly - 3 sets - 10 reps - Clamshell with Resistance  - 1 x daily - 7 x weekly - 3 sets - 10 reps - Shoulder External Rotation and Scapular Retraction with Resistance  - 1 x daily - 7 x weekly - 3 sets - 10 reps - Prone on Elbows Stretch  - 1 x daily - 7 x weekly - 3 sets - 10 reps - Hip Extension with Resistance Loop  - 1 x daily - 7 x weekly - 3 sets - 10 reps - Scapular Stability on Wall With Pillowcase (Hemiplegia)  - 1 x daily - 7 x weekly - 3 sets - 10 reps Access Code: [email or web address] URL: [email or web address] Date: 06/05/2022 Prepared by: Raynelle Fanning  Exercises - Supine Lower Trunk Rotation  - 2 x daily - 7 x weekly - 1 sets - 5 reps - 10 sec hold - Cat Cow to Child's Pose  - 1 x daily - 7 x weekly - 1-3 sets - 10 reps - Seated Scapular Retraction  - 1 x daily - 7 x weekly - 1-3 sets - 10 reps - 2-3 sec hold - Doorway Pec Stretch at 90 Degrees Abduction  - 1 x daily - 7 x weekly - 1  sets - 3 reps - 30-60 seconds hold  ASSESSMENT:  CLINICAL IMPRESSION: Pt shows lumbar AROM to be mildly improved since eval. Continued progressing postural exercises and stretching. Postural cues were required on the second set of hip ext at wall. His shoulders get tired rather quickly with the exercises. He continues to show postural limitations with decreased trunk/hip extension. Will need progress note next visit.   OBJECTIVE IMPAIRMENTS: decreased activity tolerance, decreased mobility, decreased ROM, decreased strength, increased muscle spasms, impaired flexibility, impaired UE functional use, postural dysfunction, and pain.   ACTIVITY LIMITATIONS: standing and locomotion level  PARTICIPATION LIMITATIONS:  walking for exercise  PERSONAL FACTORS: Fitness, Profession, Time since onset of injury/illness/exacerbation, and 1-2 comorbidities: DM, previous back surgery  are also affecting patient's functional outcome.   REHAB POTENTIAL: Excellent  CLINICAL DECISION MAKING: Evolving/moderate complexity  EVALUATION COMPLEXITY: Moderate   GOALS: Goals reviewed with patient? Yes  SHORT TERM GOALS: Target date: 07/03/2022   Patient will be independent with initial HEP.  Baseline:  Goal status: MET  2.  Patient  will report ability to centralize radicular symptoms with therex.  Baseline:  Goal status: IN PROGRESS- 06/30/22   3.  Complete FOTO for back by 2nd visit Baseline:  Goal status: MET  LONG TERM GOALS: Target date: 07/31/2022   Patient will be independent with advanced/ongoing HEP to improve outcomes and carryover.  Baseline:  Goal status: IN PROGRESS  2.  Patient will report 75% improvement in low back radicular pain to improve QOL.  Baseline:  Goal status: IN PROGRESS  3.  Patient will demonstrate full/functional pain free lumbar ROM to perform ADLs.   Baseline:  Goal status: IN PROGRESS- 07/20/22  4.  Patient will report 2/50 on Modified Oswestry to demonstrate  improved functional ability.  Baseline: 8 / 50 = 16.0 % Goal status: IN PROGRESS   5.  Patient will tolerate 30 min of walking without increased pain. Baseline:  Goal status: IN PROGRESS- 07/17/22 11-12 min  6.  Patient to demonstrate ability to achieve and maintain good spinal alignment/posturing and body mechanics needed for daily activities. Baseline:  Goal status: IN PROGRESS  7.  Patient will report 75% improvement in bil shoulder pain with petting cats and reaching outward with ADLS. Baseline:  Goal status: IN PROGRESS  8  Patient will report 17 on Quick Dash to demonstrate improved functional ability.  Baseline: 27.3 / 100 = 27.3 % Goal status: IN PROGRESS  PLAN:  PT FREQUENCY: 2x/week  PT DURATION: 8 weeks  PLANNED INTERVENTIONS: Therapeutic exercises, Therapeutic activity, Neuromuscular re-education, Patient/Family education, Self Care, Joint mobilization, Aquatic Therapy, Dry Needling, Electrical stimulation, Spinal manipulation, Spinal mobilization, Cryotherapy, Moist heat, Taping, Traction, Ultrasound, Ionotophoresis 4mg /ml Dexamethasone, and Manual therapy.  PLAN FOR NEXT SESSION: progress note; extension exercises; HS stretcing; review lateral shift with lateral glide on wall, Postural strengthening and core stability   Darleene Cleaver, PTA 07/20/2022, 9:38 AM

## 2022-07-25 ENCOUNTER — Ambulatory Visit: Payer: BC Managed Care – PPO

## 2022-07-25 DIAGNOSIS — R293 Abnormal posture: Secondary | ICD-10-CM | POA: Diagnosis not present

## 2022-07-25 DIAGNOSIS — M5416 Radiculopathy, lumbar region: Secondary | ICD-10-CM

## 2022-07-25 DIAGNOSIS — R252 Cramp and spasm: Secondary | ICD-10-CM

## 2022-07-25 DIAGNOSIS — M25511 Pain in right shoulder: Secondary | ICD-10-CM

## 2022-07-25 DIAGNOSIS — M25512 Pain in left shoulder: Secondary | ICD-10-CM | POA: Diagnosis not present

## 2022-07-25 NOTE — Therapy (Signed)
OUTPATIENT PHYSICAL THERAPY THORACOLUMBAR AND SHOULDER TREATMENT   Patient Name: Anthony Russell MRN: [id number] DOB:[birth year], 42 y.o., male Today's Date: 07/25/2022  END OF SESSION:  PT End of Session - 07/25/22 0856     Visit Number 10    Date for PT Re-Evaluation 07/31/22    Authorization Type [employer]    PT Start Time [phone]    PT Stop Time 0930    PT Time Calculation (min) 40 min    Activity Tolerance Patient tolerated treatment well    Behavior During Therapy WFL for tasks assessed/performed                  Past Medical History:  Diagnosis Date   Allergy    Allergy, unspecified not elsewhere classified    rx w/ OTC antihistamines PRN   Anxiety    Asthma    Atypical chest pain    recent neg eval w/ normal cxr/ekg   Depression    Diverticulosis    DM (diabetes mellitus) (HCC)    Dyspepsia    on protonix 40mg /d   Esophagitis    GERD (gastroesophageal reflux disease)    on protonix 40mg /d   HTN (hypertension)    Hyperlipidemia    on diet alone   IBS (irritable bowel syndrome)    Lumbar back pain    s/p lumbar laminectomy 2007 by DrCabbell   Migraines    Overweight(278.02)    weight Jan10=246#.[name].he was 225# in 9/07...diet and exercise was discussed   Sleep apnea    not wearing c-pap currently   Past Surgical History:  Procedure Laterality Date   DENTAL SURGERY     LUMBAR LAMINECTOMY  01/30/2005   DrCabbell   TEAR DUCT PROBING     Patient Active Problem List   Diagnosis Date Noted   Lumbar radiculopathy 05/10/2022   Capsulitis of left shoulder 05/10/2022   Sinus tachycardia 12/28/2021   Impingement of knee joint, right 12/20/2021   Achilles tendinitis, right leg 12/20/2021   Rotator cuff tendinitis, right 11/04/2021   Encounter for screening for other metabolic disorders 04/07/2021   Dyspnea on exertion 04/07/2021   Migraines 02/23/2021   Lumbar back pain 02/23/2021   HTN (hypertension) 02/23/2021   Dyspepsia 02/23/2021   DM (diabetes  mellitus) (HCC) 02/23/2021   Nasal septal deviation 02/17/2021   Laryngitis 01/19/2021   Asymmetric tonsils 12/01/2020   Subacromial bursitis of right shoulder joint 05/17/2020   Hypersomnia 11/03/2015   Obesity 11/03/2015   Allergic rhinitis 04/15/2014   Asthma in adult 04/15/2014   GERD (gastroesophageal reflux disease) 04/15/2014   IBS (irritable bowel syndrome) 04/01/2013   Nausea alone 01/15/2013   Diarrhea 01/15/2013   Depression 10/15/2012   Lesion of eyebrow 02/09/2012   Gastroparesis 07/05/2011   Anxiety 07/05/2010   TESTOSTERONE DEFICIENCY 05/26/2008   Hyperlipidemia 02/06/2008   Facet arthropathy, lumbar 02/06/2008   Atypical chest pain 02/06/2008    PCP: Esperanza Richters, PA-C   REFERRING PROVIDER: Lenda Kelp, MD   REFERRING DIAG:  54.16 (ICD-10-CM) - Lumbar radiculopathy  M75.81 (ICD-10-CM) - Rotator cuff tendinitis, right  M77.8 (ICD-10-CM) - Capsulitis of left shoulder    Rationale for Evaluation and Treatment: Rehabilitation  THERAPY DIAG:  Radiculopathy, lumbar region  Acute pain of both shoulders  Cramp and spasm  Abnormal posture  ONSET DATE: R leg pain several years; Shoulders - months  SUBJECTIVE:  SUBJECTIVE STATEMENT: " Don't really have any issues with R LE unless lifting it to put pants or shorts on."  PERTINENT HISTORY:  DM, Lumbar laminectomy L 2007  PAIN:  Are you having pain? No  Are you having pain? Yes: NPRS scale: 0/10 Pain location: deltoid region Pain description: throbbing, intermittent Aggravating factors: horizontal ABD bil Relieving factors: rest  PRECAUTIONS: None  WEIGHT BEARING RESTRICTIONS: No  FALLS:  Has patient fallen in last 6 months? No  LIVING ENVIRONMENT: Lives with: lives alone Lives in:  House/apartment Stairs: Yes: External: 2 flights steps; can reach both Has following equipment at home: None  OCCUPATION: Sport and exercise psychologist, works at home; has sit down and stand up desk  PLOF: Independent  PATIENT GOALS: get regular movement back in back, be able to exercise without pain and pet my cats  NEXT MD VISIT: 06/19/22  OBJECTIVE:   DIAGNOSTIC FINDINGS:  XR Left - negative XR Right - Mild glenohumeral degenerative changes without loss of joint space.  [place] Right - thickening of the coracohumeral ligament and the deltoid ligament   PATIENT SURVEYS:  Modified Oswestry 8 / 50 = 16.0 %  Quick Dash 27.3 / 100 = 27.3 % FOTO TBD  SCREENING FOR RED FLAGS: Neg  COGNITION: Overall cognitive status: Within functional limits for tasks assessed     SENSATION: WFL  MUSCLE LENGTH: Marked tightness in bil HS, quads, hip flexors, hip rotators and lumbar  POSTURE: rounded shoulders, forward head, and tight QL with depressed R scapula; left lateral shift  PALPATION: R gluteus medius and lumbar R>L  LUMBAR ROM:   AROM eval 07/20/22  Flexion Limited by HS to knees Right below knees  Extension 75% limited 60% limit- pain  Right lateral flexion 50% limited 40% limit  Left lateral flexion 50% limited 40% limit  Right rotation 25% limited 25% limit  Left rotation 25% limited 25% limit   (Blank rows = not tested)  LOWER EXTREMITY ROM:   marked flexibility deficits affecting full range of bil hip and knee joints  LOWER EXTREMITY MMT:  Grossly 5/5 in  B LE   LUMBAR SPECIAL TESTS:  Straight leg raise test: Positive and Slump test: Positive  SHOULDER SPECIAL TESTS: Impingement tests: Neer impingement test: negative and Hawkins/Kennedy impingement test: positive  R  Rotator cuff assessment: neg bil  UPPER EXTREMITY ROM: Functional bil shoulder ROM  Active ROM Right eval Left eval  Shoulder flexion    Shoulder extension    Shoulder abduction    Shoulder adduction     Shoulder extension    Shoulder internal rotation    Shoulder external rotation    Elbow flexion    Elbow extension    Wrist flexion    Wrist extension    Wrist ulnar deviation    Wrist radial deviation    Wrist pronation    Wrist supination     (Blank rows = not tested)  UPPER EXTREMITY MMT: Grossly 5/5 bil except R  ER 4+5 with mild pain   TODAY'S TREATMENT:  DATE: 07/25/22 Therapeutic Exercise: to improve strength and mobility.  Demo, verbal and tactile cues throughout for technique.  Bike L3x31min Wall angels 2x10 with chin tucks Standing shoulder flexion at wall with chin tuck 2# weights x 10  S/L shoulder abduction 2# 2x10 Bil S/L shoulder ER 2x10 LUE; x 10 RUE Standing pallof press GTB x 10 bil Standing shoulder ext GTB 2x10  07/20/22 Therapeutic Exercise: to improve strength and mobility.  Demo, verbal and tactile cues throughout for technique.  Bike L3x61min Assess lumbar AROM Wall angels 2x10 Lumbar extension standing at wall x 10  Wall push ups 2x10 Standing hip extension 2x10 at wall  Staggered stance wall slide into flexion x 10  Seated hamstring stretch 2x30 sec leg propped on mat table Prone on elbows with neck extension x 10  Prone quad stretch 2x30 sec w/ strap  Manual Therapy: to decrease muscle spasm, pain and improve mobility IASTM w/ foam roll to thoracolumbar PS in prone  07/17/22 Therapeutic Exercise: to improve strength and mobility.  Demo, verbal and tactile cues throughout for technique.  Bike L2x72min Lat pulls 35# 2x10 Rows 35# 2x10 Wall angels against wall w/ towel behind head 2x10 Wall slides into scaption bil 2x10 Prone on elbows 2x10 Prone hip extension x 10 bil Quadruped x 10 LE extension bil Quadruped x 10 arm raise Bird dog x 10 bil ER and horizontal ABD w/ green TB 2x10  Manual Therapy: to decrease muscle  spasm, pain and improve mobility IASTM w/ foam roll to thoracolumbar PS in prone    07/11/22 Bike L2x60min Prone on elbows 3x with long holds Prone knee bends bil x 10  Prone hip extension x 10 bil Ys and Ts standing against the wall x 10 2# bil Wall slide with staggered stance for hip flexor stretch x 10 bil Supine R figure 4 and KTOS stretch 2x30 seconds  Supine trunk rotation with feet on orange pball x 10  Supine bridge with feet on ornage pball x 10  Supine hamstring stretch 3x30 seconds  Manual Therapy: to decrease muscle spasm, pain and improve mobility.  Manual R hip distraction in supine long leg distraction  07/06/22: Therapeutic exercise:  bike, L 3, x 6 min Pallof press 5#, 2 x 10 Quadriped multifidus 10 x each Prone on elbows, static, then wt shifting side to side, and then alt hamstring curls 10 x each Kettle bell wing with 6# B shoulder flexion wall slides with isometric B shoulder abd in pillowcase 10 x Standing alt hip ext with blue t band around thighs Standing dead lifts 6#, 10 x 2  2022/07/18 Therapeutic Exercise: Bike L3x64min Quadruped multifidus hip raise 2x10 bil Bird dog x 10 bil Deadlift 6lb both sides 2x10 Kettle bell swing 2x10 6lb Standing ER red TB 2x10 Standing horizontal ABD red TB 2x10 Pallof press w/ cable 5# 2x10   06/30/22 Therapeutic Exercise: UBE L2.0 3 min fwd/ 3 min back Squats 6lb 2x10 touch to table Deadlift with 6lb touch to table 2x10 - cues to avoid rounding back Standing ER with red TB against wall x 10 with emphasis on cervical extension Standing horiz ABD red TB against wall 2x10 with emphasis on cervical extension Standing shoulder flexion bil with thoracic ext  Bridge with blue TB 2x10 with 5 second hold Donkey kick in quadruped x 10 bil w/ blue TB Prone hip extension x10  Supine figure 4 stretch 30 seconds bil SKTC 30 sec bil Seated isometric abs with yellow pball 10x3"  06/28/22 Therapeutic Exercise: Bike  L3x70min Bridge with blue TB 2x10 S/L clamshells blue TB 2x10 B Supine R quad stretch w/ strap 2x30 sec Lat pulls 35# 2x10 Seated rows 35# 2x10 Suqat with 5lb DB touch to mat table 2x10 Deadlift x 10 no weight B ER red TB x 10  Manual Therapy:  STM to R quads, more RF, VL  06/21/22 Bike L2x66min Doorway pec stretch 2x30 sec Reviewed scapular retraction Lumbar FOTO: 61.05% Bridge with TrA engagement 2x10 Alternate LE extension with TrA x 10 B Supine clam blue TB 2x10: bent knee fallout 2nd set Supine march blue tb 2x10 Lat pulls 25# x 20  06/05/22  See pt ed and HEP   PATIENT EDUCATION:  Education details:  Person educated: Patient Education method: Explanation, Demonstration, and Handouts Education comprehension: verbalized understanding and returned demonstration  HOME EXERCISE PROGRAM: Access Code: [email or web address] URL: [email or web address] Date: 07/06/2022 Prepared by: Amy Speaks  Exercises - Supine Lower Trunk Rotation  - 2 x daily - 7 x weekly - 1 sets - 5 reps - 10 sec hold - Cat Cow to Child's Pose  - 1 x daily - 7 x weekly - 1-3 sets - 10 reps - Seated Scapular Retraction  - 1 x daily - 7 x weekly - 1-3 sets - 10 reps - 2-3 sec hold - Doorway Pec Stretch at 90 Degrees Abduction  - 1 x daily - 7 x weekly - 1 sets - 3 reps - 30-60 seconds hold - Supine Bridge with Resistance Band  - 1 x daily - 7 x weekly - 3 sets - 10 reps - Clamshell with Resistance  - 1 x daily - 7 x weekly - 3 sets - 10 reps - Shoulder External Rotation and Scapular Retraction with Resistance  - 1 x daily - 7 x weekly - 3 sets - 10 reps - Prone on Elbows Stretch  - 1 x daily - 7 x weekly - 3 sets - 10 reps - Hip Extension with Resistance Loop  - 1 x daily - 7 x weekly - 3 sets - 10 reps - Scapular Stability on Wall With Pillowcase (Hemiplegia)  - 1 x daily - 7 x weekly - 3 sets - 10 reps Access Code: [email or web address] URL: [email or web address] Date: 06/05/2022 Prepared by:  Raynelle Fanning  Exercises - Supine Lower Trunk Rotation  - 2 x daily - 7 x weekly - 1 sets - 5 reps - 10 sec hold - Cat Cow to Child's Pose  - 1 x daily - 7 x weekly - 1-3 sets - 10 reps - Seated Scapular Retraction  - 1 x daily - 7 x weekly - 1-3 sets - 10 reps - 2-3 sec hold - Doorway Pec Stretch at 90 Degrees Abduction  - 1 x daily - 7 x weekly - 1 sets - 3 reps - 30-60 seconds hold  ASSESSMENT:  CLINICAL IMPRESSION: Kailash reports 70% improvement in LBP and 50% improvement in bil shoulder pain. He still needs cues to hold his head up when standing and to avoid leaning over. He shows difficulty with shoulder abduction in standing w/o pain and notes some issues with donning/doffing pants lifting his R leg. He would like a recert next visit.  OBJECTIVE IMPAIRMENTS: decreased activity tolerance, decreased mobility, decreased ROM, decreased strength, increased muscle spasms, impaired flexibility, impaired UE functional use, postural dysfunction, and pain.   ACTIVITY LIMITATIONS: standing and locomotion level  PARTICIPATION LIMITATIONS:  walking for exercise  PERSONAL FACTORS: Fitness, Profession, Time  since onset of injury/illness/exacerbation, and 1-2 comorbidities: DM, previous back surgery  are also affecting patient's functional outcome.   REHAB POTENTIAL: Excellent  CLINICAL DECISION MAKING: Evolving/moderate complexity  EVALUATION COMPLEXITY: Moderate   GOALS: Goals reviewed with patient? Yes  SHORT TERM GOALS: Target date: 07/03/2022   Patient will be independent with initial HEP.  Baseline:  Goal status: MET  2.  Patient will report ability to centralize radicular symptoms with therex.  Baseline:  Goal status: IN PROGRESS- 06/30/22  3.  Complete FOTO for back by 2nd visit Baseline:  Goal status: MET  LONG TERM GOALS: Target date: 07/31/2022   Patient will be independent with advanced/ongoing HEP to improve outcomes and carryover.  Baseline:  Goal status: IN PROGRESS  2.   Patient will report 75% improvement in low back radicular pain to improve QOL.  Baseline:  Goal status: IN PROGRESS- 07/25/22 (70% improvement)  3.  Patient will demonstrate full/functional pain free lumbar ROM to perform ADLs.   Baseline:  Goal status: IN PROGRESS- 07/20/22  4.  Patient will report 2/50 on Modified Oswestry to demonstrate improved functional ability.  Baseline: 8 / 50 = 16.0 % Goal status: IN PROGRESS   5.  Patient will tolerate 30 min of walking without increased pain. Baseline:  Goal status: IN PROGRESS- 07/17/22 11-12 min  6.  Patient to demonstrate ability to achieve and maintain good spinal alignment/posturing and body mechanics needed for daily activities. Baseline:  Goal status: IN PROGRESS  7.  Patient will report 75% improvement in bil shoulder pain with petting cats and reaching outward with ADLS. Baseline:  Goal status: IN PROGRESS- 07/25/22 (50% improvement)  8  Patient will report 17 on Quick Dash to demonstrate improved functional ability.  Baseline: 27.3 / 100 = 27.3 % Goal status: IN PROGRESS  PLAN:  PT FREQUENCY: 2x/week  PT DURATION: 8 weeks  PLANNED INTERVENTIONS: Therapeutic exercises, Therapeutic activity, Neuromuscular re-education, Patient/Family education, Self Care, Joint mobilization, Aquatic Therapy, Dry Needling, Electrical stimulation, Spinal manipulation, Spinal mobilization, Cryotherapy, Moist heat, Taping, Traction, Ultrasound, Ionotophoresis 4mg /ml Dexamethasone, and Manual therapy.  PLAN FOR NEXT SESSION: progress note; extension exercises; HS stretcing; review lateral shift with lateral glide on wall, Postural strengthening and core stability   Serena Petterson L Sena Clouatre, PTA 07/25/2022, 9:30 AM

## 2022-07-27 ENCOUNTER — Other Ambulatory Visit: Payer: Self-pay

## 2022-07-27 ENCOUNTER — Ambulatory Visit: Payer: BC Managed Care – PPO

## 2022-07-27 DIAGNOSIS — R252 Cramp and spasm: Secondary | ICD-10-CM | POA: Diagnosis not present

## 2022-07-27 DIAGNOSIS — M5416 Radiculopathy, lumbar region: Secondary | ICD-10-CM | POA: Diagnosis not present

## 2022-07-27 DIAGNOSIS — M25511 Pain in right shoulder: Secondary | ICD-10-CM | POA: Diagnosis not present

## 2022-07-27 DIAGNOSIS — R293 Abnormal posture: Secondary | ICD-10-CM | POA: Diagnosis not present

## 2022-07-27 DIAGNOSIS — M25512 Pain in left shoulder: Secondary | ICD-10-CM | POA: Diagnosis not present

## 2022-07-27 NOTE — Therapy (Signed)
OUTPATIENT PHYSICAL THERAPY THORACOLUMBAR AND SHOULDER PROGRESS NOTE  Progress Note Reporting Period 06/05/22 to 07/27/22  See note below for Objective Data and Assessment of Progress/Goals.     Patient Name: Anthony Russell MRN: [id number] DOB:06-09-1980, 42 y.o., male Today's Date: 07/27/2022  END OF SESSION:  PT End of Session - 07/27/22 0850     Visit Number 11    Date for PT Re-Evaluation 09/07/22    Authorization Type [employer]    PT Start Time 0848    PT Stop Time 0930    PT Time Calculation (min) 42 min    Activity Tolerance Patient tolerated treatment well    Behavior During Therapy WFL for tasks assessed/performed                  Past Medical History:  Diagnosis Date   Allergy    Allergy, unspecified not elsewhere classified    rx w/ OTC antihistamines PRN   Anxiety    Asthma    Atypical chest pain    recent neg eval w/ normal cxr/ekg   Depression    Diverticulosis    DM (diabetes mellitus) (HCC)    Dyspepsia    on protonix 40mg /d   Esophagitis    GERD (gastroesophageal reflux disease)    on protonix 40mg /d   HTN (hypertension)    Hyperlipidemia    on diet alone   IBS (irritable bowel syndrome)    Lumbar back pain    s/p lumbar laminectomy 2007 by DrCabbell   Migraines    Overweight(278.02)    weight Jan10=246#.[name].he was 225# in 9/07...diet and exercise was discussed   Sleep apnea    not wearing c-pap currently   Past Surgical History:  Procedure Laterality Date   DENTAL SURGERY     LUMBAR LAMINECTOMY  01/30/2005   DrCabbell   TEAR DUCT PROBING     Patient Active Problem List   Diagnosis Date Noted   Lumbar radiculopathy 05/10/2022   Capsulitis of left shoulder 05/10/2022   Sinus tachycardia 12/28/2021   Impingement of knee joint, right 12/20/2021   Achilles tendinitis, right leg 12/20/2021   Rotator cuff tendinitis, right 11/04/2021   Encounter for screening for other metabolic disorders 04/07/2021   Dyspnea on exertion 04/07/2021    Migraines 02/23/2021   Lumbar back pain 02/23/2021   HTN (hypertension) 02/23/2021   Dyspepsia 02/23/2021   DM (diabetes mellitus) (HCC) 02/23/2021   Nasal septal deviation 02/17/2021   Laryngitis 01/19/2021   Asymmetric tonsils 12/01/2020   Subacromial bursitis of right shoulder joint 05/17/2020   Hypersomnia 11/03/2015   Obesity 11/03/2015   Allergic rhinitis 04/15/2014   Asthma in adult 04/15/2014   GERD (gastroesophageal reflux disease) 04/15/2014   IBS (irritable bowel syndrome) 04/01/2013   Nausea alone 01/15/2013   Diarrhea 01/15/2013   Depression 10/15/2012   Lesion of eyebrow 02/09/2012   Gastroparesis 07/05/2011   Anxiety 07/05/2010   TESTOSTERONE DEFICIENCY 05/26/2008   Hyperlipidemia 02/06/2008   Facet arthropathy, lumbar 02/06/2008   Atypical chest pain 02/06/2008    PCP: Esperanza Richters, PA-C   REFERRING PROVIDER: Lenda Kelp, MD   REFERRING DIAG:  54.16 (ICD-10-CM) - Lumbar radiculopathy  M75.81 (ICD-10-CM) - Rotator cuff tendinitis, right  M77.8 (ICD-10-CM) - Capsulitis of left shoulder    Rationale for Evaluation and Treatment: Rehabilitation  THERAPY DIAG:  Radiculopathy, lumbar region  Acute pain of both shoulders  Cramp and spasm  Abnormal posture  ONSET DATE: R leg pain several years; Shoulders - months  SUBJECTIVE:                                                                                                                                                                                           SUBJECTIVE STATEMENT: Not a lot of pain today.  Not having to take my medicines.  PERTINENT HISTORY:  DM, Lumbar laminectomy L 2007  PAIN:  Are you having pain? No  Are you having pain? Yes: NPRS scale: 0/10 Pain location: deltoid region Pain description: throbbing, intermittent Aggravating factors: horizontal ABD bil Relieving factors: rest  PRECAUTIONS: None  WEIGHT BEARING RESTRICTIONS: No  FALLS:  Has patient fallen in  last 6 months? No  LIVING ENVIRONMENT: Lives with: lives alone Lives in: House/apartment Stairs: Yes: External: 2 flights steps; can reach both Has following equipment at home: None  OCCUPATION: Sport and exercise psychologist, works at home; has sit down and stand up desk  PLOF: Independent  PATIENT GOALS: get regular movement back in back, be able to exercise without pain and pet my cats  NEXT MD VISIT: 06/19/22  OBJECTIVE:   DIAGNOSTIC FINDINGS:  XR Left - negative XR Right - Mild glenohumeral degenerative changes without loss of joint space.  [place] Right - thickening of the coracohumeral ligament and the deltoid ligament   PATIENT SURVEYS:  Modified Oswestry 8 / 50 = 16.0 %  Quick Dash 27.3 / 100 = 27.3 % FOTO TBD  SCREENING FOR RED FLAGS: Neg  COGNITION: Overall cognitive status: Within functional limits for tasks assessed     SENSATION: WFL  MUSCLE LENGTH: Marked tightness in bil HS, quads, hip flexors, hip rotators and lumbar  POSTURE: rounded shoulders, forward head, and tight QL with depressed R scapula; left lateral shift  PALPATION: R gluteus medius and lumbar R>L  LUMBAR ROM:   AROM eval 07/20/22  Flexion Limited by HS to knees Right below knees  Extension 75% limited 60% limit- pain  Right lateral flexion 50% limited 40% limit  Left lateral flexion 50% limited 40% limit  Right rotation 25% limited 25% limit  Left rotation 25% limited 25% limit   (Blank rows = not tested)  LOWER EXTREMITY ROM:   marked flexibility deficits affecting full range of bil hip and knee joints  LOWER EXTREMITY MMT:  Grossly 5/5 in  B LE   LUMBAR SPECIAL TESTS:  Straight leg raise test: Positive and Slump test: Positive  SHOULDER SPECIAL TESTS: Impingement tests: Neer impingement test: negative and Hawkins/Kennedy impingement test: positive  R  Rotator cuff assessment: neg bil  UPPER EXTREMITY ROM: Functional bil shoulder ROM  Active ROM Right eval Left eval  Shoulder flexion  Shoulder extension    Shoulder abduction    Shoulder adduction    Shoulder extension    Shoulder internal rotation    Shoulder external rotation    Elbow flexion    Elbow extension    Wrist flexion    Wrist extension    Wrist ulnar deviation    Wrist radial deviation    Wrist pronation    Wrist supination     (Blank rows = not tested)  UPPER EXTREMITY MMT: Grossly 5/5 bil except R  ER 4+5 with mild pain   TODAY'S TREATMENT:                                                                                                                              DATE: 07/27/26: Nustep level 5 x6 min Quadriped alt arm amd leg lifts Seated rows 45# Prone for foam roller IASTM thoracic region and gluteals Supine stretch over 1/2 foam roller with pecs stretch at 90 degrees horiz abd and with B shoulder flexion Seated for lat pull downs with 45# 2 sets 15 reps Standing hip abd with cable,5#, 10 x 2 sets Leg press, B hips, 25# 10 x 3 Prone props, added pillow under abdomen to allow for better ant hip and trunk relaxation, inst to wt shift side to side to improve thoracic mobility and scapular movement Prone for manual stretching by PT for B ant hip flexors,40 sec holds each side x 3 bouts each.  07/25/22 Therapeutic Exercise: to improve strength and mobility.  Demo, verbal and tactile cues throughout for technique.  Bike L3x1min Wall angels 2x10 with chin tucks Standing shoulder flexion at wall with chin tuck 2# weights x 10  S/L shoulder abduction 2# 2x10 Bil S/L shoulder ER 2x10 LUE; x 10 RUE Standing pallof press GTB x 10 bil Standing shoulder ext GTB 2x10  07/20/22 Therapeutic Exercise: to improve strength and mobility.  Demo, verbal and tactile cues throughout for technique.  Bike L3x28min Assess lumbar AROM Wall angels 2x10 Lumbar extension standing at wall x 10  Wall push ups 2x10 Standing hip extension 2x10 at wall  Staggered stance wall slide into flexion x 10  Seated hamstring  stretch 2x30 sec leg propped on mat table Prone on elbows with neck extension x 10  Prone quad stretch 2x30 sec w/ strap  Manual Therapy: to decrease muscle spasm, pain and improve mobility IASTM w/ foam roll to thoracolumbar PS in prone  07/17/22 Therapeutic Exercise: to improve strength and mobility.  Demo, verbal and tactile cues throughout for technique.  Bike L2x85min Lat pulls 35# 2x10 Rows 35# 2x10 Wall angels against wall w/ towel behind head 2x10 Wall slides into scaption bil 2x10 Prone on elbows 2x10 Prone hip extension x 10 bil Quadruped x 10 LE extension bil Quadruped x 10 arm raise Bird dog x 10 bil ER and horizontal ABD w/ green TB 2x10  Manual Therapy: to decrease muscle spasm, pain and improve mobility  IASTM w/ foam roll to thoracolumbar PS in prone    07/11/22 Bike L2x7min Prone on elbows 3x with long holds Prone knee bends bil x 10  Prone hip extension x 10 bil Ys and Ts standing against the wall x 10 2# bil Wall slide with staggered stance for hip flexor stretch x 10 bil Supine R figure 4 and KTOS stretch 2x30 seconds  Supine trunk rotation with feet on orange pball x 10  Supine bridge with feet on ornage pball x 10  Supine hamstring stretch 3x30 seconds  Manual Therapy: to decrease muscle spasm, pain and improve mobility.  Manual R hip distraction in supine long leg distraction  07/06/22: Therapeutic exercise:  bike, L 3, x 6 min Pallof press 5#, 2 x 10 Quadriped multifidus 10 x each Prone on elbows, static, then wt shifting side to side, and then alt hamstring curls 10 x each Kettle bell wing with 6# B shoulder flexion wall slides with isometric B shoulder abd in pillowcase 10 x Standing alt hip ext with blue t band around thighs Standing dead lifts 6#, 10 x 2  Jul 11, 2022 Therapeutic Exercise: Bike L3x28min Quadruped multifidus hip raise 2x10 bil Bird dog x 10 bil Deadlift 6lb both sides 2x10 Kettle bell swing 2x10 6lb Standing ER red TB  2x10 Standing horizontal ABD red TB 2x10 Pallof press w/ cable 5# 2x10   06/30/22 Therapeutic Exercise: UBE L2.0 3 min fwd/ 3 min back Squats 6lb 2x10 touch to table Deadlift with 6lb touch to table 2x10 - cues to avoid rounding back Standing ER with red TB against wall x 10 with emphasis on cervical extension Standing horiz ABD red TB against wall 2x10 with emphasis on cervical extension Standing shoulder flexion bil with thoracic ext  Bridge with blue TB 2x10 with 5 second hold Donkey kick in quadruped x 10 bil w/ blue TB Prone hip extension x10  Supine figure 4 stretch 30 seconds bil SKTC 30 sec bil Seated isometric abs with yellow pball 10x3"  06/28/22 Therapeutic Exercise: Bike L3x59min Bridge with blue TB 2x10 S/L clamshells blue TB 2x10 B Supine R quad stretch w/ strap 2x30 sec Lat pulls 35# 2x10 Seated rows 35# 2x10 Suqat with 5lb DB touch to mat table 2x10 Deadlift x 10 no weight B ER red TB x 10  Manual Therapy:  STM to R quads, more RF, VL  06/21/22 Bike L2x20min Doorway pec stretch 2x30 sec Reviewed scapular retraction Lumbar FOTO: 61.05% Bridge with TrA engagement 2x10 Alternate LE extension with TrA x 10 B Supine clam blue TB 2x10: bent knee fallout 2nd set Supine march blue tb 2x10 Lat pulls 25# x 20  06/05/22  See pt ed and HEP   PATIENT EDUCATION:  Education details:  Person educated: Patient Education method: Explanation, Demonstration, and Handouts Education comprehension: verbalized understanding and returned demonstration  HOME EXERCISE PROGRAM: Access Code: [email or web address] URL: [email or web address] Date: 07/06/2022 Prepared by: Theressa Piedra  Exercises - Supine Lower Trunk Rotation  - 2 x daily - 7 x weekly - 1 sets - 5 reps - 10 sec hold - Cat Cow to Child's Pose  - 1 x daily - 7 x weekly - 1-3 sets - 10 reps - Seated Scapular Retraction  - 1 x daily - 7 x weekly - 1-3 sets - 10 reps - 2-3 sec hold - Doorway Pec Stretch at 90  Degrees Abduction  - 1 x daily - 7 x weekly - 1 sets - 3 reps - 30-60  seconds hold - Supine Bridge with Resistance Band  - 1 x daily - 7 x weekly - 3 sets - 10 reps - Clamshell with Resistance  - 1 x daily - 7 x weekly - 3 sets - 10 reps - Shoulder External Rotation and Scapular Retraction with Resistance  - 1 x daily - 7 x weekly - 3 sets - 10 reps - Prone on Elbows Stretch  - 1 x daily - 7 x weekly - 3 sets - 10 reps - Hip Extension with Resistance Loop  - 1 x daily - 7 x weekly - 3 sets - 10 reps - Scapular Stability on Wall With Pillowcase (Hemiplegia)  - 1 x daily - 7 x weekly - 3 sets - 10 reps Access Code: [email or web address] URL: [email or web address] Date: 06/05/2022 Prepared by: Raynelle Fanning  Exercises - Supine Lower Trunk Rotation  - 2 x daily - 7 x weekly - 1 sets - 5 reps - 10 sec hold - Cat Cow to Child's Pose  - 1 x daily - 7 x weekly - 1-3 sets - 10 reps - Seated Scapular Retraction  - 1 x daily - 7 x weekly - 1-3 sets - 10 reps - 2-3 sec hold - Doorway Pec Stretch at 90 Degrees Abduction  - 1 x daily - 7 x weekly - 1 sets - 3 reps - 30-60 seconds hold  ASSESSMENT:  CLINICAL IMPRESSION:Reassessed today for progress: Continued ant hip and abdominal stiffness which limits lumbar extension and upright posture. Continued with strengthening for posture, and stretching, spinal mobility. will continue skilled PT for one more month as he is making improvements and progressing with all goals.  OBJECTIVE IMPAIRMENTS: decreased activity tolerance, decreased mobility, decreased ROM, decreased strength, increased muscle spasms, impaired flexibility, impaired UE functional use, postural dysfunction, and pain.   ACTIVITY LIMITATIONS: standing and locomotion level  PARTICIPATION LIMITATIONS:  walking for exercise  PERSONAL FACTORS: Fitness, Profession, Time since onset of injury/illness/exacerbation, and 1-2 comorbidities: DM, previous back surgery  are also affecting patient's functional  outcome.   REHAB POTENTIAL: Excellent  CLINICAL DECISION MAKING: Evolving/moderate complexity  EVALUATION COMPLEXITY: Moderate   GOALS: Goals reviewed with patient? Yes  SHORT TERM GOALS: Target date: 07/03/2022   Patient will be independent with initial HEP.  Baseline:  Goal status: MET  2.  Patient will report ability to centralize radicular symptoms with therex.  Baseline:  Goal status: IN PROGRESS- 06/30/22  3.  Complete FOTO for back by 2nd visit Baseline:  Goal status: MET  LONG TERM GOALS: Target date: 07/31/2022   Patient will be independent with advanced/ongoing HEP to improve outcomes and carryover.  Baseline:  Goal status: IN PROGRESS 07/27/22  has a plethora of home exercises  2.  Patient will report 75% improvement in low back radicular pain to improve QOL.  Baseline:  Goal status: IN PROGRESS- 07/25/22 (70% improvement)  3.  Patient will demonstrate full/functional pain free lumbar ROM to perform ADLs.   Baseline:  Goal status: IN PROGRESS- 07/20/22  4.  Patient will report 2/50 on Modified Oswestry to demonstrate improved functional ability.  Baseline: 8 / 50 = 16.0 % Goal status: IN PROGRESS 07/27/22: na today  5.  Patient will tolerate 30 min of walking without increased pain. Baseline:  Goal status: IN PROGRESS- 07/17/22 11-12 min 07/27/22: reports goal met so far, able to walk up to 30 min  6.  Patient to demonstrate ability to achieve and maintain good spinal alignment/posturing and body mechanics needed for  daily activities. Baseline:  Goal status: IN PROGRESS 07/27/22: Improving  7.  Patient will report 75% improvement in bil shoulder pain with petting cats and reaching outward with ADLS. Baseline:  Goal status: IN PROGRESS- 07/25/22 (50% improvement)  8  Patient will report 17 on Quick Dash to demonstrate improved functional ability.  Baseline: 27.3 / 100 = 27.3 % Goal status: IN PROGRESS 6.8%,   PLAN:  PT FREQUENCY: 2x/week  PT DURATION: 8  weeks  PLANNED INTERVENTIONS: Therapeutic exercises, Therapeutic activity, Neuromuscular re-education, Patient/Family education, Self Care, Joint mobilization, Aquatic Therapy, Dry Needling, Electrical stimulation, Spinal manipulation, Spinal mobilization, Cryotherapy, Moist heat, Taping, Traction, Ultrasound, Ionotophoresis 4mg /ml Dexamethasone, and Manual therapy.  PLAN FOR NEXT SESSION: extension exercises; ant hip stretch, gluteal strengthening, Postural strengthening and core stability   Rayshon Albaugh L Paden Kuras, PT 07/27/2022, 3:22 PM

## 2022-07-27 NOTE — Addendum Note (Signed)
Addended byMaxcine Ham, Orva Gwaltney L on: 07/27/2022 06:00 PM   Modules accepted: Orders

## 2022-07-31 ENCOUNTER — Ambulatory Visit: Payer: BC Managed Care – PPO | Attending: Family Medicine | Admitting: Physical Therapy

## 2022-07-31 ENCOUNTER — Encounter: Payer: Self-pay | Admitting: Physical Therapy

## 2022-07-31 DIAGNOSIS — M5416 Radiculopathy, lumbar region: Secondary | ICD-10-CM | POA: Insufficient documentation

## 2022-07-31 DIAGNOSIS — R293 Abnormal posture: Secondary | ICD-10-CM | POA: Insufficient documentation

## 2022-07-31 DIAGNOSIS — M25511 Pain in right shoulder: Secondary | ICD-10-CM | POA: Diagnosis not present

## 2022-07-31 DIAGNOSIS — M25512 Pain in left shoulder: Secondary | ICD-10-CM | POA: Diagnosis not present

## 2022-07-31 DIAGNOSIS — R252 Cramp and spasm: Secondary | ICD-10-CM | POA: Insufficient documentation

## 2022-07-31 NOTE — Therapy (Signed)
OUTPATIENT PHYSICAL THERAPY TREATMENT    Patient Name: Anthony Russell MRN: [id number] DOB:Aug 23, 1980, 42 y.o., male Today's Date: 07/31/2022  END OF SESSION:  PT End of Session - 07/31/22 1620     Visit Number 12    Date for PT Re-Evaluation 09/07/22    Authorization Type [employer]    PT Start Time 1620    PT Stop Time 1703    PT Time Calculation (min) 43 min    Activity Tolerance Patient tolerated treatment well    Behavior During Therapy WFL for tasks assessed/performed                  Past Medical History:  Diagnosis Date   Allergy    Allergy, unspecified not elsewhere classified    rx w/ OTC antihistamines PRN   Anxiety    Asthma    Atypical chest pain    recent neg eval w/ normal cxr/ekg   Depression    Diverticulosis    DM (diabetes mellitus) (HCC)    Dyspepsia    on protonix 40mg /d   Esophagitis    GERD (gastroesophageal reflux disease)    on protonix 40mg /d   HTN (hypertension)    Hyperlipidemia    on diet alone   IBS (irritable bowel syndrome)    Lumbar back pain    s/p lumbar laminectomy 2007 by DrCabbell   Migraines    Overweight(278.02)    weight Jan10=246#.[name].he was 225# in 9/07...diet and exercise was discussed   Sleep apnea    not wearing c-pap currently   Past Surgical History:  Procedure Laterality Date   DENTAL SURGERY     LUMBAR LAMINECTOMY  01/30/2005   DrCabbell   TEAR DUCT PROBING     Patient Active Problem List   Diagnosis Date Noted   Lumbar radiculopathy 05/10/2022   Capsulitis of left shoulder 05/10/2022   Sinus tachycardia 12/28/2021   Impingement of knee joint, right 12/20/2021   Achilles tendinitis, right leg 12/20/2021   Rotator cuff tendinitis, right 11/04/2021   Encounter for screening for other metabolic disorders 04/07/2021   Dyspnea on exertion 04/07/2021   Migraines 02/23/2021   Lumbar back pain 02/23/2021   HTN (hypertension) 02/23/2021   Dyspepsia 02/23/2021   DM (diabetes mellitus) (HCC) 02/23/2021    Nasal septal deviation 02/17/2021   Laryngitis 01/19/2021   Asymmetric tonsils 12/01/2020   Subacromial bursitis of right shoulder joint 05/17/2020   Hypersomnia 11/03/2015   Obesity 11/03/2015   Allergic rhinitis 04/15/2014   Asthma in adult 04/15/2014   GERD (gastroesophageal reflux disease) 04/15/2014   IBS (irritable bowel syndrome) 04/01/2013   Nausea alone 01/15/2013   Diarrhea 01/15/2013   Depression 10/15/2012   Lesion of eyebrow 02/09/2012   Gastroparesis 07/05/2011   Anxiety 07/05/2010   TESTOSTERONE DEFICIENCY 05/26/2008   Hyperlipidemia 02/06/2008   Facet arthropathy, lumbar 02/06/2008   Atypical chest pain 02/06/2008    PCP: Esperanza Richters, PA-C   REFERRING PROVIDER: Lenda Kelp, MD   REFERRING DIAG:  54.16 (ICD-10-CM) - Lumbar radiculopathy  M75.81 (ICD-10-CM) - Rotator cuff tendinitis, right  M77.8 (ICD-10-CM) - Capsulitis of left shoulder    Rationale for Evaluation and Treatment: Rehabilitation  THERAPY DIAG:  Radiculopathy, lumbar region  Acute pain of both shoulders  Cramp and spasm  Abnormal posture  ONSET DATE: R leg pain several years; Shoulders - months  SUBJECTIVE:  SUBJECTIVE STATEMENT: Doing ok, occasional R knee pain with crouching.  Walked 40 min yesterday and didn't have any pain.    PERTINENT HISTORY:  DM, Lumbar laminectomy L 2007  PAIN:  Are you having pain? No  PRECAUTIONS: None  WEIGHT BEARING RESTRICTIONS: No  FALLS:  Has patient fallen in last 6 months? No  LIVING ENVIRONMENT: Lives with: lives alone Lives in: House/apartment Stairs: Yes: External: 2 flights steps; can reach both Has following equipment at home: None  OCCUPATION: Sport and exercise psychologist, works at home; has sit down and stand up desk  PLOF:  Independent  PATIENT GOALS: get regular movement back in back, be able to exercise without pain and pet my cats  NEXT MD VISIT: 06/19/22  OBJECTIVE:   DIAGNOSTIC FINDINGS:  XR Left - negative XR Right - Mild glenohumeral degenerative changes without loss of joint space.  [place] Right - thickening of the coracohumeral ligament and the deltoid ligament   PATIENT SURVEYS:  Modified Oswestry 8 / 50 = 16.0 %  Quick Dash 27.3 / 100 = 27.3 % FOTO TBD  SCREENING FOR RED FLAGS: Neg  COGNITION: Overall cognitive status: Within functional limits for tasks assessed     SENSATION: WFL  MUSCLE LENGTH: Marked tightness in bil HS, quads, hip flexors, hip rotators and lumbar  POSTURE: rounded shoulders, forward head, and tight QL with depressed R scapula; left lateral shift  PALPATION: R gluteus medius and lumbar R>L  LUMBAR ROM:   AROM eval 07/20/22  Flexion Limited by HS to knees Right below knees  Extension 75% limited 60% limit- pain  Right lateral flexion 50% limited 40% limit  Left lateral flexion 50% limited 40% limit  Right rotation 25% limited 25% limit  Left rotation 25% limited 25% limit   (Blank rows = not tested)  LOWER EXTREMITY ROM:   marked flexibility deficits affecting full range of bil hip and knee joints  LOWER EXTREMITY MMT:  Grossly 5/5 in  B LE   LUMBAR SPECIAL TESTS:  Straight leg raise test: Positive and Slump test: Positive  SHOULDER SPECIAL TESTS: Impingement tests: Neer impingement test: negative and Hawkins/Kennedy impingement test: positive  R  Rotator cuff assessment: neg bil  UPPER EXTREMITY ROM: Functional bil shoulder ROM  Active ROM Right eval Left eval  Shoulder flexion    Shoulder extension    Shoulder abduction    Shoulder adduction    Shoulder extension    Shoulder internal rotation    Shoulder external rotation    Elbow flexion    Elbow extension    Wrist flexion    Wrist extension    Wrist ulnar deviation    Wrist radial  deviation    Wrist pronation    Wrist supination     (Blank rows = not tested)  UPPER EXTREMITY MMT: Grossly 5/5 bil except R  ER 4+5 with mild pain   TODAY'S TREATMENT:  DATE:  07/31/22 Nustep L5 x 6 min  Open books x 10 bil  Windshield wipers x 10  On foam roller - back stroke x 10 for thoracic mobilization, snow angels x 10, pec stretch x 1 min Hip flexor stretch x 1 min bil  Double leg stretch on wall x 3 min for hamstrings.  Standing L -stretch at counter Standing low back stretch at counter x 10   07/27/22: Nustep level 5 x6 min Quadriped alt arm amd leg lifts Seated rows 45# Prone for foam roller IASTM thoracic region and gluteals Supine stretch over 1/2 foam roller with pecs stretch at 90 degrees horiz abd and with B shoulder flexion Seated for lat pull downs with 45# 2 sets 15 reps Standing hip abd with cable,5#, 10 x 2 sets Leg press, B hips, 25# 10 x 3 Prone props, added pillow under abdomen to allow for better ant hip and trunk relaxation, inst to wt shift side to side to improve thoracic mobility and scapular movement Prone for manual stretching by PT for B ant hip flexors,40 sec holds each side x 3 bouts each.  07/25/22 Therapeutic Exercise: to improve strength and mobility.  Demo, verbal and tactile cues throughout for technique.  Bike L3x54min Wall angels 2x10 with chin tucks Standing shoulder flexion at wall with chin tuck 2# weights x 10  S/L shoulder abduction 2# 2x10 Bil S/L shoulder ER 2x10 LUE; x 10 RUE Standing pallof press GTB x 10 bil Standing shoulder ext GTB 2x10  07/20/22 Therapeutic Exercise: to improve strength and mobility.  Demo, verbal and tactile cues throughout for technique.  Bike L3x36min Assess lumbar AROM Wall angels 2x10 Lumbar extension standing at wall x 10  Wall push ups 2x10 Standing hip extension 2x10 at  wall  Staggered stance wall slide into flexion x 10  Seated hamstring stretch 2x30 sec leg propped on mat table Prone on elbows with neck extension x 10  Prone quad stretch 2x30 sec w/ strap  Manual Therapy: to decrease muscle spasm, pain and improve mobility IASTM w/ foam roll to thoracolumbar PS in prone   PATIENT EDUCATION:  Education details:  Person educated: Patient Education method: Programmer, multimedia, Facilities manager, and Handouts Education comprehension: verbalized understanding and returned demonstration  HOME EXERCISE PROGRAM: Access Code: [email or web address] URL: [email or web address] Date: 07/31/2022 Prepared by: Harrie Foreman  Exercises - Supine Lower Trunk Rotation  - 2 x daily - 7 x weekly - 1 sets - 5 reps - 10 sec hold - Cat Cow to Child's Pose  - 1 x daily - 7 x weekly - 1-3 sets - 10 reps - Seated Scapular Retraction  - 1 x daily - 7 x weekly - 1-3 sets - 10 reps - 2-3 sec hold - Doorway Pec Stretch at 90 Degrees Abduction  - 1 x daily - 7 x weekly - 1 sets - 3 reps - 30-60 seconds hold - Supine Bridge with Resistance Band  - 1 x daily - 7 x weekly - 3 sets - 10 reps - Clamshell with Resistance  - 1 x daily - 7 x weekly - 3 sets - 10 reps - Shoulder External Rotation and Scapular Retraction with Resistance  - 1 x daily - 7 x weekly - 3 sets - 10 reps - Prone on Elbows Stretch  - 1 x daily - 7 x weekly - 3 sets - 10 reps - Hip Extension with Resistance Loop  - 1 x daily - 7 x weekly - 3 sets -  10 reps - Scapular Stability on Wall With Pillowcase (Hemiplegia)  - 1 x daily - 7 x weekly - 3 sets - 10 reps - Supine Hamstring Stretch with Strap  - 1 x daily - 7 x weekly - 3 sets - 30 sec hold - Supine Hip Internal and External Rotation  - 1 x daily - 7 x weekly - 1 sets - 10 reps - Sidelying Open Book Thoracic Lumbar Rotation and Extension  - 1 x daily - 7 x weekly - 1 sets - 10 reps - Modified Thomas Stretch  - 1 x daily - 7 x weekly - 1 sets - 3 reps - 1 min  hold -  Double Leg Hamstring Stretch at Wall  - 1 x daily - 7 x weekly - 1 sets - 1 reps - 5 min  hold - Standing 'L' Stretch at [employer]  - 3 x daily - 7 x weekly - 1 sets - 10 reps - Standing Lumbar Extension with Counter  - 3 x daily - 7 x weekly - 1 sets - 10 reps - Thoracic Foam Roll Mobilization Backstroke  - 1 x daily - 7 x weekly - 2 sets - 10 reps - [employer] on Foam Roll  - 1 x daily - 7 x weekly - 2 sets - 10 reps - Supine Static Chest Stretch on Foam Roll  - 1 x daily - 7 x weekly - 1 sets - 1 reps - 1 min hold   ASSESSMENT:  CLINICAL IMPRESSION: SHAQUILLA REESE reports improving tolerance to exercise, but still very stiff and tight. Today focused on mobility and stretches, to improve posture.  He tolerated very well and reported decreased stiffness and soreness following interventions.  Updated HEP, included foam roller exercises by request.  [name] continues to demonstrate potential for improvement and would benefit from continued skilled therapy to address impairments.    OBJECTIVE IMPAIRMENTS: decreased activity tolerance, decreased mobility, decreased ROM, decreased strength, increased muscle spasms, impaired flexibility, impaired UE functional use, postural dysfunction, and pain.   ACTIVITY LIMITATIONS: standing and locomotion level  PARTICIPATION LIMITATIONS:  walking for exercise  PERSONAL FACTORS: Fitness, Profession, Time since onset of injury/illness/exacerbation, and 1-2 comorbidities: DM, previous back surgery  are also affecting patient's functional outcome.   REHAB POTENTIAL: Excellent  CLINICAL DECISION MAKING: Evolving/moderate complexity  EVALUATION COMPLEXITY: Moderate   GOALS: Goals reviewed with patient? Yes  SHORT TERM GOALS: Target date: 07/03/2022   Patient will be independent with initial HEP.  Baseline:  Goal status: MET  2.  Patient will report ability to centralize radicular symptoms with therex.  Baseline:  Goal status: IN PROGRESS-  06/30/22  3.  Complete FOTO for back by 2nd visit Baseline:  Goal status: MET  LONG TERM GOALS: Target date: 07/31/2022 extended to 09/07/22  Patient will be independent with advanced/ongoing HEP to improve outcomes and carryover.  Baseline:  Goal status: IN PROGRESS 07/27/22  has a plethora of home exercises  2.  Patient will report 75% improvement in low back radicular pain to improve QOL.  Baseline:  Goal status: IN PROGRESS- 07/25/22 (70% improvement)  3.  Patient will demonstrate full/functional pain free lumbar ROM to perform ADLs.   Baseline:  Goal status: IN PROGRESS- 07/20/22  4.  Patient will report 2/50 on Modified Oswestry to demonstrate improved functional ability.  Baseline: 8 / 50 = 16.0 % Goal status: IN PROGRESS 07/27/22: na today  5.  Patient will tolerate 30 min  of walking without increased pain. Baseline:  Goal status: IN PROGRESS- 07/17/22 11-12 min 07/27/22: reports goal met so far, able to walk up to 30 min  6.  Patient to demonstrate ability to achieve and maintain good spinal alignment/posturing and body mechanics needed for daily activities. Baseline:  Goal status: IN PROGRESS 07/27/22: Improving  7.  Patient will report 75% improvement in bil shoulder pain with petting cats and reaching outward with ADLS. Baseline:  Goal status: IN PROGRESS- 07/25/22 (50% improvement)  8  Patient will report 17 on Quick Dash to demonstrate improved functional ability.  Baseline: 27.3 / 100 = 27.3 % Goal status: IN PROGRESS 6.8%,   PLAN:  PT FREQUENCY: 2x/week  PT DURATION: 8 weeks  PLANNED INTERVENTIONS: Therapeutic exercises, Therapeutic activity, Neuromuscular re-education, Patient/Family education, Self Care, Joint mobilization, Aquatic Therapy, Dry Needling, Electrical stimulation, Spinal manipulation, Spinal mobilization, Cryotherapy, Moist heat, Taping, Traction, Ultrasound, Ionotophoresis 4mg /ml Dexamethasone, and Manual therapy.  PLAN FOR NEXT SESSION: extension  exercises; ant hip stretch, gluteal strengthening, Postural strengthening and core stability   Jena Gauss, PT 07/31/2022, 5:11 PM

## 2022-08-07 ENCOUNTER — Ambulatory Visit: Payer: BC Managed Care – PPO

## 2022-08-07 ENCOUNTER — Other Ambulatory Visit: Payer: Self-pay | Admitting: Medical

## 2022-08-07 DIAGNOSIS — M25512 Pain in left shoulder: Secondary | ICD-10-CM | POA: Diagnosis not present

## 2022-08-07 DIAGNOSIS — R252 Cramp and spasm: Secondary | ICD-10-CM

## 2022-08-07 DIAGNOSIS — M25511 Pain in right shoulder: Secondary | ICD-10-CM

## 2022-08-07 DIAGNOSIS — M5416 Radiculopathy, lumbar region: Secondary | ICD-10-CM

## 2022-08-07 DIAGNOSIS — R293 Abnormal posture: Secondary | ICD-10-CM

## 2022-08-07 NOTE — Therapy (Signed)
OUTPATIENT PHYSICAL THERAPY TREATMENT    Patient Name: Anthony Russell MRN: [id number] DOB:08-26-1980, 42 y.o., male Today's Date: 08/07/2022  END OF SESSION:  PT End of Session - 08/07/22 1457     Visit Number 13    Date for PT Re-Evaluation 09/07/22    Authorization Type [employer]    PT Start Time 1450    PT Stop Time 1530    PT Time Calculation (min) 40 min    Activity Tolerance Patient tolerated treatment well    Behavior During Therapy WFL for tasks assessed/performed                   Past Medical History:  Diagnosis Date   Allergy    Allergy, unspecified not elsewhere classified    rx w/ OTC antihistamines PRN   Anxiety    Asthma    Atypical chest pain    recent neg eval w/ normal cxr/ekg   Depression    Diverticulosis    DM (diabetes mellitus) (HCC)    Dyspepsia    on protonix 40mg /d   Esophagitis    GERD (gastroesophageal reflux disease)    on protonix 40mg /d   HTN (hypertension)    Hyperlipidemia    on diet alone   IBS (irritable bowel syndrome)    Lumbar back pain    s/p lumbar laminectomy 2007 by DrCabbell   Migraines    Overweight(278.02)    weight Jan10=246#.[name].he was 225# in 9/07...diet and exercise was discussed   Sleep apnea    not wearing c-pap currently   Past Surgical History:  Procedure Laterality Date   DENTAL SURGERY     LUMBAR LAMINECTOMY  01/30/2005   DrCabbell   TEAR DUCT PROBING     Patient Active Problem List   Diagnosis Date Noted   Lumbar radiculopathy 05/10/2022   Capsulitis of left shoulder 05/10/2022   Sinus tachycardia 12/28/2021   Impingement of knee joint, right 12/20/2021   Achilles tendinitis, right leg 12/20/2021   Rotator cuff tendinitis, right 11/04/2021   Encounter for screening for other metabolic disorders 04/07/2021   Dyspnea on exertion 04/07/2021   Migraines 02/23/2021   Lumbar back pain 02/23/2021   HTN (hypertension) 02/23/2021   Dyspepsia 02/23/2021   DM (diabetes mellitus) (HCC) 02/23/2021    Nasal septal deviation 02/17/2021   Laryngitis 01/19/2021   Asymmetric tonsils 12/01/2020   Subacromial bursitis of right shoulder joint 05/17/2020   Hypersomnia 11/03/2015   Obesity 11/03/2015   Allergic rhinitis 04/15/2014   Asthma in adult 04/15/2014   GERD (gastroesophageal reflux disease) 04/15/2014   IBS (irritable bowel syndrome) 04/01/2013   Nausea alone 01/15/2013   Diarrhea 01/15/2013   Depression 10/15/2012   Lesion of eyebrow 02/09/2012   Gastroparesis 07/05/2011   Anxiety 07/05/2010   TESTOSTERONE DEFICIENCY 05/26/2008   Hyperlipidemia 02/06/2008   Facet arthropathy, lumbar 02/06/2008   Atypical chest pain 02/06/2008    PCP: Esperanza Richters, PA-C   REFERRING PROVIDER: Lenda Kelp, MD   REFERRING DIAG:  54.16 (ICD-10-CM) - Lumbar radiculopathy  M75.81 (ICD-10-CM) - Rotator cuff tendinitis, right  M77.8 (ICD-10-CM) - Capsulitis of left shoulder    Rationale for Evaluation and Treatment: Rehabilitation  THERAPY DIAG:  Radiculopathy, lumbar region  Acute pain of both shoulders  Cramp and spasm  Abnormal posture  ONSET DATE: R leg pain several years; Shoulders - months  SUBJECTIVE:  SUBJECTIVE STATEMENT: Increased pain today from walking a lot over the weekend.  PERTINENT HISTORY:  DM, Lumbar laminectomy L 2007  PAIN:  Are you having pain? No 6/10 in R knee, stinging/sharp  PRECAUTIONS: None  WEIGHT BEARING RESTRICTIONS: No  FALLS:  Has patient fallen in last 6 months? No  LIVING ENVIRONMENT: Lives with: lives alone Lives in: House/apartment Stairs: Yes: External: 2 flights steps; can reach both Has following equipment at home: None  OCCUPATION: Sport and exercise psychologist, works at home; has sit down and stand up desk  PLOF: Independent  PATIENT GOALS: get  regular movement back in back, be able to exercise without pain and pet my cats  NEXT MD VISIT: 06/19/22  OBJECTIVE:   DIAGNOSTIC FINDINGS:  XR Left - negative XR Right - Mild glenohumeral degenerative changes without loss of joint space.  [place] Right - thickening of the coracohumeral ligament and the deltoid ligament   PATIENT SURVEYS:  Modified Oswestry 8 / 50 = 16.0 %  Quick Dash 27.3 / 100 = 27.3 % FOTO TBD  SCREENING FOR RED FLAGS: Neg  COGNITION: Overall cognitive status: Within functional limits for tasks assessed     SENSATION: WFL  MUSCLE LENGTH: Marked tightness in bil HS, quads, hip flexors, hip rotators and lumbar  POSTURE: rounded shoulders, forward head, and tight QL with depressed R scapula; left lateral shift  PALPATION: R gluteus medius and lumbar R>L  LUMBAR ROM:   AROM eval 07/20/22  Flexion Limited by HS to knees Right below knees  Extension 75% limited 60% limit- pain  Right lateral flexion 50% limited 40% limit  Left lateral flexion 50% limited 40% limit  Right rotation 25% limited 25% limit  Left rotation 25% limited 25% limit   (Blank rows = not tested)  LOWER EXTREMITY ROM:   marked flexibility deficits affecting full range of bil hip and knee joints  LOWER EXTREMITY MMT:  Grossly 5/5 in  B LE   LUMBAR SPECIAL TESTS:  Straight leg raise test: Positive and Slump test: Positive  SHOULDER SPECIAL TESTS: Impingement tests: Neer impingement test: negative and Hawkins/Kennedy impingement test: positive  R  Rotator cuff assessment: neg bil  UPPER EXTREMITY ROM: Functional bil shoulder ROM  Active ROM Right eval Left eval  Shoulder flexion    Shoulder extension    Shoulder abduction    Shoulder adduction    Shoulder extension    Shoulder internal rotation    Shoulder external rotation    Elbow flexion    Elbow extension    Wrist flexion    Wrist extension    Wrist ulnar deviation    Wrist radial deviation    Wrist pronation     Wrist supination     (Blank rows = not tested)  UPPER EXTREMITY MMT: Grossly 5/5 bil except R  ER 4+5 with mild pain   TODAY'S TREATMENT:  DATE: 08/07/22 Bike L3x67min R hip ADD/ER stretch hooklying 2x30 sec R hip add stretch supine w/ strap 3x15 sec S/L hip abduction x 15 R/L Bridge with blue TB 2x15 S/L clamshells blue TB 2x10 Standing shld ext w/ cable  I, T, Y x 10 at doorframe STM to R hip adductors, and lateral patellar ligament   07/31/22 Nustep L5 x 6 min  Open books x 10 bil  Windshield wipers x 10  On foam roller - back stroke x 10 for thoracic mobilization, snow angels x 10, pec stretch x 1 min Hip flexor stretch x 1 min bil  Double leg stretch on wall x 3 min for hamstrings.  Standing L -stretch at counter Standing low back stretch at counter x 10   07/27/22: Nustep level 5 x6 min Quadriped alt arm amd leg lifts Seated rows 45# Prone for foam roller IASTM thoracic region and gluteals Supine stretch over 1/2 foam roller with pecs stretch at 90 degrees horiz abd and with B shoulder flexion Seated for lat pull downs with 45# 2 sets 15 reps Standing hip abd with cable,5#, 10 x 2 sets Leg press, B hips, 25# 10 x 3 Prone props, added pillow under abdomen to allow for better ant hip and trunk relaxation, inst to wt shift side to side to improve thoracic mobility and scapular movement Prone for manual stretching by PT for B ant hip flexors,40 sec holds each side x 3 bouts each.  07/25/22 Therapeutic Exercise: to improve strength and mobility.  Demo, verbal and tactile cues throughout for technique.  Bike L3x56min Wall angels 2x10 with chin tucks Standing shoulder flexion at wall with chin tuck 2# weights x 10  S/L shoulder abduction 2# 2x10 Bil S/L shoulder ER 2x10 LUE; x 10 RUE Standing pallof press GTB x 10 bil Standing shoulder ext GTB  2x10  07/20/22 Therapeutic Exercise: to improve strength and mobility.  Demo, verbal and tactile cues throughout for technique.  Bike L3x63min Assess lumbar AROM Wall angels 2x10 Lumbar extension standing at wall x 10  Wall push ups 2x10 Standing hip extension 2x10 at wall  Staggered stance wall slide into flexion x 10  Seated hamstring stretch 2x30 sec leg propped on mat table Prone on elbows with neck extension x 10  Prone quad stretch 2x30 sec w/ strap  Manual Therapy: to decrease muscle spasm, pain and improve mobility IASTM w/ foam roll to thoracolumbar PS in prone   PATIENT EDUCATION:  Education details:  Person educated: Patient Education method: Programmer, multimedia, Facilities manager, and Handouts Education comprehension: verbalized understanding and returned demonstration  HOME EXERCISE PROGRAM: Access Code: [email or web address] URL: [email or web address] Date: 07/31/2022 Prepared by: Harrie Foreman  Exercises - Supine Lower Trunk Rotation  - 2 x daily - 7 x weekly - 1 sets - 5 reps - 10 sec hold - Cat Cow to Child's Pose  - 1 x daily - 7 x weekly - 1-3 sets - 10 reps - Seated Scapular Retraction  - 1 x daily - 7 x weekly - 1-3 sets - 10 reps - 2-3 sec hold - Doorway Pec Stretch at 90 Degrees Abduction  - 1 x daily - 7 x weekly - 1 sets - 3 reps - 30-60 seconds hold - Supine Bridge with Resistance Band  - 1 x daily - 7 x weekly - 3 sets - 10 reps - Clamshell with Resistance  - 1 x daily - 7 x weekly - 3 sets - 10 reps - Shoulder External Rotation and  Scapular Retraction with Resistance  - 1 x daily - 7 x weekly - 3 sets - 10 reps - Prone on Elbows Stretch  - 1 x daily - 7 x weekly - 3 sets - 10 reps - Hip Extension with Resistance Loop  - 1 x daily - 7 x weekly - 3 sets - 10 reps - Scapular Stability on Wall With Pillowcase (Hemiplegia)  - 1 x daily - 7 x weekly - 3 sets - 10 reps - Supine Hamstring Stretch with Strap  - 1 x daily - 7 x weekly - 3 sets - 30 sec hold -  Supine Hip Internal and External Rotation  - 1 x daily - 7 x weekly - 1 sets - 10 reps - Sidelying Open Book Thoracic Lumbar Rotation and Extension  - 1 x daily - 7 x weekly - 1 sets - 10 reps - Modified Thomas Stretch  - 1 x daily - 7 x weekly - 1 sets - 3 reps - 1 min  hold - Double Leg Hamstring Stretch at Wall  - 1 x daily - 7 x weekly - 1 sets - 1 reps - 5 min  hold - Standing 'L' Stretch at Counter  - 3 x daily - 7 x weekly - 1 sets - 10 reps - Standing Lumbar Extension with Counter  - 3 x daily - 7 x weekly - 1 sets - 10 reps - Thoracic Foam Roll Mobilization Backstroke  - 1 x daily - 7 x weekly - 2 sets - 10 reps - [employer] on Foam Roll  - 1 x daily - 7 x weekly - 2 sets - 10 reps - Supine Static Chest Stretch on Foam Roll  - 1 x daily - 7 x weekly - 1 sets - 1 reps - 1 min hold   ASSESSMENT:  CLINICAL IMPRESSION:  Able to progress with strengthening with no issues. Pt did have increased pain in is R knee today so we did some manual, finding TTP and knots in the R hip adductors. Improvements made after manual and cues provided as needed with exercises. TITO PARKHOUSE continues to demonstrate potential for improvement and would benefit from continued skilled therapy to address impairments.    OBJECTIVE IMPAIRMENTS: decreased activity tolerance, decreased mobility, decreased ROM, decreased strength, increased muscle spasms, impaired flexibility, impaired UE functional use, postural dysfunction, and pain.   ACTIVITY LIMITATIONS: standing and locomotion level  PARTICIPATION LIMITATIONS:  walking for exercise  PERSONAL FACTORS: Fitness, Profession, Time since onset of injury/illness/exacerbation, and 1-2 comorbidities: DM, previous back surgery  are also affecting patient's functional outcome.   REHAB POTENTIAL: Excellent  CLINICAL DECISION MAKING: Evolving/moderate complexity  EVALUATION COMPLEXITY: Moderate   GOALS: Goals reviewed with patient? Yes  SHORT TERM GOALS: Target  date: 07/03/2022   Patient will be independent with initial HEP.  Baseline:  Goal status: MET  2.  Patient will report ability to centralize radicular symptoms with therex.  Baseline:  Goal status: IN PROGRESS- 06/30/22  3.  Complete FOTO for back by 2nd visit Baseline:  Goal status: MET  LONG TERM GOALS: Target date: 07/31/2022 extended to 09/07/22  Patient will be independent with advanced/ongoing HEP to improve outcomes and carryover.  Baseline:  Goal status: IN PROGRESS 07/27/22  has a plethora of home exercises  2.  Patient will report 75% improvement in low back radicular pain to improve QOL.  Baseline:  Goal status: IN PROGRESS- 07/25/22 (70% improvement)  3.  Patient will  demonstrate full/functional pain free lumbar ROM to perform ADLs.   Baseline:  Goal status: IN PROGRESS- 07/20/22  4.  Patient will report 2/50 on Modified Oswestry to demonstrate improved functional ability.  Baseline: 8 / 50 = 16.0 % Goal status: IN PROGRESS 07/27/22: na today  5.  Patient will tolerate 30 min of walking without increased pain. Baseline:  Goal status: IN PROGRESS- 07/17/22 11-12 min 07/27/22: reports goal met so far, able to walk up to 30 min  6.  Patient to demonstrate ability to achieve and maintain good spinal alignment/posturing and body mechanics needed for daily activities. Baseline:  Goal status: IN PROGRESS 07/27/22: Improving  7.  Patient will report 75% improvement in bil shoulder pain with petting cats and reaching outward with ADLS. Baseline:  Goal status: IN PROGRESS- 07/25/22 (50% improvement)  8  Patient will report 17 on Quick Dash to demonstrate improved functional ability.  Baseline: 27.3 / 100 = 27.3 % Goal status: IN PROGRESS 6.8%,   PLAN:  PT FREQUENCY: 2x/week  PT DURATION: 8 weeks  PLANNED INTERVENTIONS: Therapeutic exercises, Therapeutic activity, Neuromuscular re-education, Patient/Family education, Self Care, Joint mobilization, Aquatic Therapy, Dry  Needling, Electrical stimulation, Spinal manipulation, Spinal mobilization, Cryotherapy, Moist heat, Taping, Traction, Ultrasound, Ionotophoresis 4mg /ml Dexamethasone, and Manual therapy.  PLAN FOR NEXT SESSION: extension exercises; ant hip stretch, gluteal strengthening, Postural strengthening and core stability   Darleene Cleaver, PTA 08/07/2022, 3:36 PM

## 2022-08-14 ENCOUNTER — Ambulatory Visit: Payer: BC Managed Care – PPO

## 2022-08-14 DIAGNOSIS — R293 Abnormal posture: Secondary | ICD-10-CM

## 2022-08-14 DIAGNOSIS — R252 Cramp and spasm: Secondary | ICD-10-CM

## 2022-08-14 DIAGNOSIS — M5416 Radiculopathy, lumbar region: Secondary | ICD-10-CM

## 2022-08-14 DIAGNOSIS — M25511 Pain in right shoulder: Secondary | ICD-10-CM

## 2022-08-14 DIAGNOSIS — M25512 Pain in left shoulder: Secondary | ICD-10-CM | POA: Diagnosis not present

## 2022-08-14 NOTE — Therapy (Signed)
OUTPATIENT PHYSICAL THERAPY TREATMENT    Patient Name: Anthony Russell MRN: [id number] DOB:[birth year], 42 y.o., male Today's Date: 08/14/2022  END OF SESSION:  PT End of Session - 08/14/22 1527     Visit Number 14    Date for PT Re-Evaluation 09/07/22    Authorization Type [employer]    PT Start Time 1450    PT Stop Time 1530    PT Time Calculation (min) 40 min    Activity Tolerance Patient tolerated treatment well    Behavior During Therapy WFL for tasks assessed/performed                    Past Medical History:  Diagnosis Date   Allergy    Allergy, unspecified not elsewhere classified    rx w/ OTC antihistamines PRN   Anxiety    Asthma    Atypical chest pain    recent neg eval w/ normal cxr/ekg   Depression    Diverticulosis    DM (diabetes mellitus) (HCC)    Dyspepsia    on protonix 40mg /d   Esophagitis    GERD (gastroesophageal reflux disease)    on protonix 40mg /d   HTN (hypertension)    Hyperlipidemia    on diet alone   IBS (irritable bowel syndrome)    Lumbar back pain    s/p lumbar laminectomy 2007 by DrCabbell   Migraines    Overweight(278.02)    weight Jan10=246#.[name].he was 225# in 9/07...diet and exercise was discussed   Sleep apnea    not wearing c-pap currently   Past Surgical History:  Procedure Laterality Date   DENTAL SURGERY     LUMBAR LAMINECTOMY  01/30/2005   DrCabbell   TEAR DUCT PROBING     Patient Active Problem List   Diagnosis Date Noted   Lumbar radiculopathy 05/10/2022   Capsulitis of left shoulder 05/10/2022   Sinus tachycardia 12/28/2021   Impingement of knee joint, right 12/20/2021   Achilles tendinitis, right leg 12/20/2021   Rotator cuff tendinitis, right 11/04/2021   Encounter for screening for other metabolic disorders 04/07/2021   Dyspnea on exertion 04/07/2021   Migraines 02/23/2021   Lumbar back pain 02/23/2021   HTN (hypertension) 02/23/2021   Dyspepsia 02/23/2021   DM (diabetes mellitus) (HCC) 02/23/2021    Nasal septal deviation 02/17/2021   Laryngitis 01/19/2021   Asymmetric tonsils 12/01/2020   Subacromial bursitis of right shoulder joint 05/17/2020   Hypersomnia 11/03/2015   Obesity 11/03/2015   Allergic rhinitis 04/15/2014   Asthma in adult 04/15/2014   GERD (gastroesophageal reflux disease) 04/15/2014   IBS (irritable bowel syndrome) 04/01/2013   Nausea alone 01/15/2013   Diarrhea 01/15/2013   Depression 10/15/2012   Lesion of eyebrow 02/09/2012   Gastroparesis 07/05/2011   Anxiety 07/05/2010   TESTOSTERONE DEFICIENCY 05/26/2008   Hyperlipidemia 02/06/2008   Facet arthropathy, lumbar 02/06/2008   Atypical chest pain 02/06/2008    PCP: Esperanza Richters, PA-C   REFERRING PROVIDER: Lenda Kelp, MD   REFERRING DIAG:  54.16 (ICD-10-CM) - Lumbar radiculopathy  M75.81 (ICD-10-CM) - Rotator cuff tendinitis, right  M77.8 (ICD-10-CM) - Capsulitis of left shoulder    Rationale for Evaluation and Treatment: Rehabilitation  THERAPY DIAG:  Radiculopathy, lumbar region  Acute pain of both shoulders  Cramp and spasm  Abnormal posture  ONSET DATE: R leg pain several years; Shoulders - months  SUBJECTIVE:  SUBJECTIVE STATEMENT: Walked a lot over on Friday, has increased R knee pain especially when squatting  PERTINENT HISTORY:  DM, Lumbar laminectomy L 2007  PAIN:  Are you having pain? No 2 initally inR knee when squatting then goes up to 8  PRECAUTIONS: None  WEIGHT BEARING RESTRICTIONS: No  FALLS:  Has patient fallen in last 6 months? No  LIVING ENVIRONMENT: Lives with: lives alone Lives in: House/apartment Stairs: Yes: External: 2 flights steps; can reach both Has following equipment at home: None  OCCUPATION: Sport and exercise psychologist, works at home; has sit down and stand up  desk  PLOF: Independent  PATIENT GOALS: get regular movement back in back, be able to exercise without pain and pet my cats  NEXT MD VISIT: 06/19/22  OBJECTIVE:   DIAGNOSTIC FINDINGS:  XR Left - negative XR Right - Mild glenohumeral degenerative changes without loss of joint space.  [place] Right - thickening of the coracohumeral ligament and the deltoid ligament   PATIENT SURVEYS:  Modified Oswestry 8 / 50 = 16.0 %  Quick Dash 27.3 / 100 = 27.3 % FOTO TBD  SCREENING FOR RED FLAGS: Neg  COGNITION: Overall cognitive status: Within functional limits for tasks assessed     SENSATION: WFL  MUSCLE LENGTH: Marked tightness in bil HS, quads, hip flexors, hip rotators and lumbar  POSTURE: rounded shoulders, forward head, and tight QL with depressed R scapula; left lateral shift  PALPATION: R gluteus medius and lumbar R>L  LUMBAR ROM:   AROM eval 07/20/22 08/14/22  Flexion Limited by HS to knees Right below knees Mid shin  Extension 75% limited 60% limit- pain 60% limit  Right lateral flexion 50% limited 40% limit 30% limit  Left lateral flexion 50% limited 40% limit 30% limit  Right rotation 25% limited 25% limit   Left rotation 25% limited 25% limit    (Blank rows = not tested)  LOWER EXTREMITY ROM:   marked flexibility deficits affecting full range of bil hip and knee joints  LOWER EXTREMITY MMT:  Grossly 5/5 in  B LE   LUMBAR SPECIAL TESTS:  Straight leg raise test: Positive and Slump test: Positive  SHOULDER SPECIAL TESTS: Impingement tests: Neer impingement test: negative and Hawkins/Kennedy impingement test: positive  R  Rotator cuff assessment: neg bil  UPPER EXTREMITY ROM: Functional bil shoulder ROM  Active ROM Right eval Left eval  Shoulder flexion    Shoulder extension    Shoulder abduction    Shoulder adduction    Shoulder extension    Shoulder internal rotation    Shoulder external rotation    Elbow flexion    Elbow extension    Wrist flexion     Wrist extension    Wrist ulnar deviation    Wrist radial deviation    Wrist pronation    Wrist supination     (Blank rows = not tested)  UPPER EXTREMITY MMT: Grossly 5/5 bil except R  ER 4+5 with mild pain   TODAY'S TREATMENT:  DATE: 08/14/22 Bike L3x55min Lumbar AROM Standing I,T,Y back to wall on pool noodle x 10 each way Wall angels back to wall x 10 Supine SLR 2x10 bil Supine bridge with march x 10  Supine bridge with step out x 10 Bird dog 2x10  08/07/22 Bike L3x14min R hip ADD/ER stretch hooklying 2x30 sec R hip add stretch supine w/ strap 3x15 sec S/L hip abduction x 15 R/L Bridge with blue TB 2x15 S/L clamshells blue TB 2x10 Standing shld ext w/ cable  I, T, Y x 10 at doorframe STM to R hip adductors, and lateral patellar ligament   07/31/22 Nustep L5 x 6 min  Open books x 10 bil  Windshield wipers x 10  On foam roller - back stroke x 10 for thoracic mobilization, snow angels x 10, pec stretch x 1 min Hip flexor stretch x 1 min bil  Double leg stretch on wall x 3 min for hamstrings.  Standing L -stretch at counter Standing low back stretch at counter x 10   07/27/22: Nustep level 5 x6 min Quadriped alt arm amd leg lifts Seated rows 45# Prone for foam roller IASTM thoracic region and gluteals Supine stretch over 1/2 foam roller with pecs stretch at 90 degrees horiz abd and with B shoulder flexion Seated for lat pull downs with 45# 2 sets 15 reps Standing hip abd with cable,5#, 10 x 2 sets Leg press, B hips, 25# 10 x 3 Prone props, added pillow under abdomen to allow for better ant hip and trunk relaxation, inst to wt shift side to side to improve thoracic mobility and scapular movement Prone for manual stretching by PT for B ant hip flexors,40 sec holds each side x 3 bouts each.  07/25/22 Therapeutic Exercise: to improve strength and  mobility.  Demo, verbal and tactile cues throughout for technique.  Bike L3x52min Wall angels 2x10 with chin tucks Standing shoulder flexion at wall with chin tuck 2# weights x 10  S/L shoulder abduction 2# 2x10 Bil S/L shoulder ER 2x10 LUE; x 10 RUE Standing pallof press GTB x 10 bil Standing shoulder ext GTB 2x10  07/20/22 Therapeutic Exercise: to improve strength and mobility.  Demo, verbal and tactile cues throughout for technique.  Bike L3x77min Assess lumbar AROM Wall angels 2x10 Lumbar extension standing at wall x 10  Wall push ups 2x10 Standing hip extension 2x10 at wall  Staggered stance wall slide into flexion x 10  Seated hamstring stretch 2x30 sec leg propped on mat table Prone on elbows with neck extension x 10  Prone quad stretch 2x30 sec w/ strap  Manual Therapy: to decrease muscle spasm, pain and improve mobility IASTM w/ foam roll to thoracolumbar PS in prone   PATIENT EDUCATION:  Education details:  Person educated: Patient Education method: Programmer, multimedia, Facilities manager, and Handouts Education comprehension: verbalized understanding and returned demonstration  HOME EXERCISE PROGRAM: Access Code: [email or web address] URL: [email or web address] Date: 07/31/2022 Prepared by: Harrie Foreman  Exercises - Supine Lower Trunk Rotation  - 2 x daily - 7 x weekly - 1 sets - 5 reps - 10 sec hold - Cat Cow to Child's Pose  - 1 x daily - 7 x weekly - 1-3 sets - 10 reps - Seated Scapular Retraction  - 1 x daily - 7 x weekly - 1-3 sets - 10 reps - 2-3 sec hold - Doorway Pec Stretch at 90 Degrees Abduction  - 1 x daily - 7 x weekly - 1 sets - 3 reps - 30-60 seconds  hold - Supine Bridge with Resistance Band  - 1 x daily - 7 x weekly - 3 sets - 10 reps - Clamshell with Resistance  - 1 x daily - 7 x weekly - 3 sets - 10 reps - Shoulder External Rotation and Scapular Retraction with Resistance  - 1 x daily - 7 x weekly - 3 sets - 10 reps - Prone on Elbows Stretch  - 1 x  daily - 7 x weekly - 3 sets - 10 reps - Hip Extension with Resistance Loop  - 1 x daily - 7 x weekly - 3 sets - 10 reps - Scapular Stability on Wall With Pillowcase (Hemiplegia)  - 1 x daily - 7 x weekly - 3 sets - 10 reps - Supine Hamstring Stretch with Strap  - 1 x daily - 7 x weekly - 3 sets - 30 sec hold - Supine Hip Internal and External Rotation  - 1 x daily - 7 x weekly - 1 sets - 10 reps - Sidelying Open Book Thoracic Lumbar Rotation and Extension  - 1 x daily - 7 x weekly - 1 sets - 10 reps - Modified Thomas Stretch  - 1 x daily - 7 x weekly - 1 sets - 3 reps - 1 min  hold - Double Leg Hamstring Stretch at Wall  - 1 x daily - 7 x weekly - 1 sets - 1 reps - 5 min  hold - Standing 'L' Stretch at Counter  - 3 x daily - 7 x weekly - 1 sets - 10 reps - Standing Lumbar Extension with Counter  - 3 x daily - 7 x weekly - 1 sets - 10 reps - Thoracic Foam Roll Mobilization Backstroke  - 1 x daily - 7 x weekly - 2 sets - 10 reps - [employer] on Foam Roll  - 1 x daily - 7 x weekly - 2 sets - 10 reps - Supine Static Chest Stretch on Foam Roll  - 1 x daily - 7 x weekly - 1 sets - 1 reps - 1 min hold   ASSESSMENT:  CLINICAL IMPRESSION:  Progressed exercise to tolerance with no complaints. Provided instruction and cuing throughout session to correct form. Good response to treatment.  Anthony Russell continues to demonstrate potential for improvement and would benefit from continued skilled therapy to address impairments.    OBJECTIVE IMPAIRMENTS: decreased activity tolerance, decreased mobility, decreased ROM, decreased strength, increased muscle spasms, impaired flexibility, impaired UE functional use, postural dysfunction, and pain.   ACTIVITY LIMITATIONS: standing and locomotion level  PARTICIPATION LIMITATIONS:  walking for exercise  PERSONAL FACTORS: Fitness, Profession, Time since onset of injury/illness/exacerbation, and 1-2 comorbidities: DM, previous back surgery  are also affecting  patient's functional outcome.   REHAB POTENTIAL: Excellent  CLINICAL DECISION MAKING: Evolving/moderate complexity  EVALUATION COMPLEXITY: Moderate   GOALS: Goals reviewed with patient? Yes  SHORT TERM GOALS: Target date: 07/03/2022   Patient will be independent with initial HEP.  Baseline:  Goal status: MET  2.  Patient will report ability to centralize radicular symptoms with therex.  Baseline:  Goal status: IN PROGRESS- 06/30/22  3.  Complete FOTO for back by 2nd visit Baseline:  Goal status: MET  LONG TERM GOALS: Target date: 07/31/2022 extended to 09/07/22  Patient will be independent with advanced/ongoing HEP to improve outcomes and carryover.  Baseline:  Goal status: IN PROGRESS 07/27/22  has a plethora of home exercises  2.  Patient will report 75%  improvement in low back radicular pain to improve QOL.  Baseline:  Goal status: IN PROGRESS- 07/25/22 (70% improvement)  3.  Patient will demonstrate full/functional pain free lumbar ROM to perform ADLs.   Baseline:  Goal status: IN PROGRESS- 07/20/22  4.  Patient will report 2/50 on Modified Oswestry to demonstrate improved functional ability.  Baseline: 8 / 50 = 16.0 % Goal status: IN PROGRESS 07/27/22: na today  5.  Patient will tolerate 30 min of walking without increased pain. Baseline:  Goal status: IN PROGRESS- 08/14/22 pt reports being able to walk 25- 30 min no pain  6.  Patient to demonstrate ability to achieve and maintain good spinal alignment/posturing and body mechanics needed for daily activities. Baseline:  Goal status: IN PROGRESS 07/27/22: Improving  7.  Patient will report 75% improvement in bil shoulder pain with petting cats and reaching outward with ADLS. Baseline:  Goal status: IN PROGRESS- 07/25/22 (50% improvement)  8  Patient will report 17 on Quick Dash to demonstrate improved functional ability.  Baseline: 27.3 / 100 = 27.3 % Goal status: IN PROGRESS 6.8%,   PLAN:  PT FREQUENCY:  2x/week  PT DURATION: 8 weeks  PLANNED INTERVENTIONS: Therapeutic exercises, Therapeutic activity, Neuromuscular re-education, Patient/Family education, Self Care, Joint mobilization, Aquatic Therapy, Dry Needling, Electrical stimulation, Spinal manipulation, Spinal mobilization, Cryotherapy, Moist heat, Taping, Traction, Ultrasound, Ionotophoresis 4mg /ml Dexamethasone, and Manual therapy.  PLAN FOR NEXT SESSION: extension exercises; ant hip stretch, gluteal strengthening, Postural strengthening and core stability   Darleene Cleaver, PTA 08/14/2022, 4:25 PM

## 2022-08-15 ENCOUNTER — Encounter: Payer: Self-pay | Admitting: Medical

## 2022-08-15 ENCOUNTER — Ambulatory Visit (INDEPENDENT_AMBULATORY_CARE_PROVIDER_SITE_OTHER): Payer: BC Managed Care – PPO | Admitting: Medical

## 2022-08-15 VITALS — BP 126/74 | HR 86 | Resp 18 | Ht 70.0 in | Wt 188.4 lb

## 2022-08-15 DIAGNOSIS — E1169 Type 2 diabetes mellitus with other specified complication: Secondary | ICD-10-CM

## 2022-08-15 DIAGNOSIS — E119 Type 2 diabetes mellitus without complications: Secondary | ICD-10-CM | POA: Diagnosis not present

## 2022-08-15 DIAGNOSIS — Z23 Encounter for immunization: Secondary | ICD-10-CM

## 2022-08-15 DIAGNOSIS — E785 Hyperlipidemia, unspecified: Secondary | ICD-10-CM

## 2022-08-15 DIAGNOSIS — Z7984 Long term (current) use of oral hypoglycemic drugs: Secondary | ICD-10-CM

## 2022-08-15 LAB — COMPREHENSIVE METABOLIC PANEL
ALT: 21 U/L (ref 0–53)
AST: 17 U/L (ref 0–37)
Albumin: 4.5 g/dL (ref 3.5–5.2)
Alkaline Phosphatase: 67 U/L (ref 39–117)
BUN: 18 mg/dL (ref 6–23)
CO2: 31 mEq/L (ref 19–32)
Calcium: 10 mg/dL (ref 8.4–10.5)
Chloride: 102 mEq/L (ref 96–112)
Creatinine, Ser: 0.86 mg/dL (ref 0.40–1.50)
GFR: 107.23 mL/min (ref >60.00)
Glucose, Bld: 87 mg/dL (ref 70–99)
Potassium: 4.2 mEq/L (ref 3.5–5.1)
Sodium: 140 mEq/L (ref 135–145)
Total Bilirubin: 0.4 mg/dL (ref 0.2–1.2)
Total Protein: 6.8 g/dL (ref 6.0–8.3)

## 2022-08-15 LAB — MICROALBUMIN / CREATININE URINE RATIO
Creatinine,U: 15.1 mg/dL
Microalb Creat Ratio: 4.6 mg/g (ref 0.0–30.0)
Microalb, Ur: <0.7 mg/dL (ref 0.0–1.9)

## 2022-08-15 LAB — LIPID PANEL
Cholesterol: 213 mg/dL — ABNORMAL HIGH (ref 0–200)
HDL: 32.5 mg/dL — ABNORMAL LOW (ref >39.00)
LDL Cholesterol: 150 mg/dL — ABNORMAL HIGH (ref 0–99)
NonHDL: 180.31
Total CHOL/HDL Ratio: 7
Triglycerides: 153 mg/dL — ABNORMAL HIGH (ref 0.0–149.0)
VLDL: 30.6 mg/dL (ref 0.0–40.0)

## 2022-08-15 LAB — HEMOGLOBIN A1C: Hgb A1c MFr Bld: 5.2 % (ref 4.6–6.5)

## 2022-08-15 NOTE — Addendum Note (Signed)
Addended by: Thelma Barge D on: 08/15/2022 11:46 AM   Modules accepted: Orders

## 2022-08-15 NOTE — Patient Instructions (Addendum)
Type 2 Diabetes Mellitus: Well controlled with Metformin 1000mg  BID and lifestyle modifications. Recent weight loss and improved diet. Last A1c was 5.9 on 05/02/2022. Home glucose monitoring shows values in the 80s-90s, highest 111. -Check A1c today. -If A1c remains tightly controlled, consider reducing Metformin to 500mg  BID. -Continue home glucose monitoring.  Gastroesophageal Reflux Disease: Improved symptoms with weight loss and dietary changes. -No current use of Protonix. -Continue current healthy diet.  Hyperlipidemia: On Atorvastatin. -Check lipid panel today.  General Health Maintenance: -Check metabolic panel today. -Check urine microalbumin today. -Schedule diabetic eye exam. -Administer Tdap vaccine today. -Discuss COVID-19 booster vaccine in September or October.(vs now for reasons discussed)   Follow up date to be determined after lab review  Esperanza Richters, PA-C

## 2022-08-15 NOTE — Progress Notes (Signed)
Subjective:    Patient ID: Anthony Russell, male    DOB: 1980/10/24, 42 y.o.   MRN: [id number]  HPI Discussed the use of AI scribe software for clinical note transcription with the patient, who gave verbal consent to proceed.  History of Present Illness   The patient, with a history of diabetes, has been closely monitoring his blood glucose levels at home, which have been consistently within the 80s and 90s, with the highest recorded level being 111. He has been adhering to a modified Mediterranean diet, which has resulted in significant weight loss. The patient's weight at home was 181.8 pounds, a decrease from his previous weight. He is currently maintaining his weight and has transitioned to a normal eating pattern. The patient is on Metformin 1000mg  twice a day for diabetes management and has expressed interest in potentially reducing the dosage due to his improved glycemic control.  In addition to diabetes, the patient has a history of acid reflux, which has been managed with Protonix. Since his weight loss and dietary changes, the frequency of acid reflux episodes has decreased. He is also on Atorvastatin for cholesterol management.  The patient has a history of COVID-19 infection, having contracted the virus twice within a short time frame. He is considering getting a COVID-19 booster shot in the upcoming months.    He also mentioned having a diabetic eye exam late last year and plan to schedule another one this year.    He is due for a Tdap vaccine.        Review of Systems  Constitutional:  Negative for chills, fatigue and fever.  Respiratory:  Negative for cough, chest tightness, shortness of breath and wheezing.   Cardiovascular:  Negative for chest pain and palpitations.  Gastrointestinal:  Negative for abdominal pain and constipation.  Genitourinary:  Negative for dysuria, flank pain and frequency.  Musculoskeletal:  Negative for back pain, joint swelling and myalgias.   Skin:  Negative for rash.  Neurological:  Negative for dizziness, light-headedness and headaches.  Hematological:  Negative for adenopathy. Does not bruise/bleed easily.  Psychiatric/Behavioral:  Negative for behavioral problems.     Past Medical History:  Diagnosis Date   Allergy    Allergy, unspecified not elsewhere classified    rx w/ OTC antihistamines PRN   Anxiety    Asthma    Atypical chest pain    recent neg eval w/ normal cxr/ekg   Depression    Diverticulosis    DM (diabetes mellitus) (HCC)    Dyspepsia    on protonix 40mg /d   Esophagitis    GERD (gastroesophageal reflux disease)    on protonix 40mg /d   HTN (hypertension)    Hyperlipidemia    on diet alone   IBS (irritable bowel syndrome)    Lumbar back pain    s/p lumbar laminectomy 2007 by DrCabbell   Migraines    Overweight(278.02)    weight Jan10=246#.[name].he was 225# in 9/07...diet and exercise was discussed   Sleep apnea    not wearing c-pap currently     Social History   Socioeconomic History   Marital status: Single    Spouse name: Not on file   Number of children: 0   Years of education: Not on file   Highest education level: Associate degree: academic program  Occupational History   Occupation: Systems analyst  Tobacco Use   Smoking status: Former    Current packs/day: 0.00    Average packs/day: 0.1 packs/day for 8.0 years (  0.8 ttl pk-yrs)    Types: Cigarettes    Start date: 03/03/2003    Quit date: 03/03/2011    Years since quitting: 11.4   Smokeless tobacco: Never  Vaping Use   Vaping status: Never Used  Substance and Sexual Activity   Alcohol use: Yes    Comment: once or twice a month   Drug use: No   Sexual activity: Not on file  Other Topics Concern   Not on file  Social History Narrative   3 cups caffeine a day   Social Determinants of Health   Financial Resource Strain: Low Risk  (04/24/2022)   Overall Financial Resource Strain (CARDIA)    Difficulty of Paying Living  Expenses: Not very hard  Food Insecurity: No Food Insecurity (04/24/2022)   Hunger Vital Sign    Worried About Running Out of Food in the Last Year: Never true    Ran Out of Food in the Last Year: Never true  Transportation Needs: No Transportation Needs (04/24/2022)   PRAPARE - Administrator, Civil Service (Medical): No    Lack of Transportation (Non-Medical): No  Physical Activity: Insufficiently Active (04/24/2022)   Exercise Vital Sign    Days of Exercise per Week: 3 days    Minutes of Exercise per Session: 10 min  Stress: No Stress Concern Present (04/24/2022)   [employer] of Occupational Health - Occupational Stress Questionnaire    Feeling of Stress : Only a little  Social Connections: Socially Isolated (04/24/2022)   Social Connection and Isolation Panel [NHANES]    Frequency of Communication with Friends and Family: Once a week    Frequency of Social Gatherings with Friends and Family: Once a week    Attends Religious Services: Never    Database administrator or Organizations: No    Attends Engineer, structural: Not on file    Marital Status: Never married  Catering manager Violence: Not on file    Past Surgical History:  Procedure Laterality Date   DENTAL SURGERY     LUMBAR LAMINECTOMY  01/30/2005   DrCabbell   TEAR DUCT PROBING      Family History  Problem Relation Age of Onset   Allergies Mother    Irritable bowel syndrome Mother    Diabetes Mother    Allergies Father    Diabetes Father    Hyperlipidemia Father    Irritable bowel syndrome Brother    Heart disease Brother    Allergies Brother    Breast cancer Maternal Aunt    Colon cancer Neg Hx    Esophageal cancer Neg Hx    Rectal cancer Neg Hx    Stomach cancer Neg Hx     No Known Allergies  Current Outpatient Medications on File Prior to Visit  Medication Sig Dispense Refill   albuterol (VENTOLIN HFA) 108 (90 Base) MCG/ACT inhaler INHALE 2 PUFFS INTO THE LUNGS EVERY 6  HOURS AS NEEDED FOR WHEEZING OR SHORTNESS OF BREATH (Patient taking differently: Inhale 2 puffs into the lungs every 6 (six) hours as needed for wheezing or shortness of breath.) 20.1 g 0   atorvastatin (LIPITOR) 10 MG tablet TAKE 1 TABLET(10 MG) BY MOUTH DAILY (Patient taking differently: Take 10 mg by mouth 3 (three) times a week.) 90 tablet 3   azelastine (ASTELIN) 0.1 % nasal spray Place 1 spray into both nostrils 2 (two) times daily. Use in each nostril as directed 30 mL 0   baclofen (LIORESAL) 10 MG tablet  Take 1 tablet (10 mg total) by mouth 2 (two) times daily. 30 each 3   budesonide-formoterol (SYMBICORT) 160-4.5 MCG/ACT inhaler Inhale 2 puffs into the lungs in the morning and at bedtime. 1 each 12   calcium carbonate (OS-CAL) 1250 (500 Ca) MG chewable tablet Chew 2 tablets by mouth daily.     Continuous Glucose Sensor (FREESTYLE LIBRE 3 SENSOR) MISC USE SENSOR TO CHECK BLOOD GLUCOSE CONTIUOUSLY. 3 each 0   dicyclomine (BENTYL) 10 MG capsule Take 1 capsule (10 mg total) by mouth 3 (three) times daily as needed for spasms (abdominal pain). 30 capsule 0   famotidine (PEPCID) 20 MG tablet Take 1 tablet (20 mg total) by mouth at bedtime. 30 tablet 3   fluticasone (FLONASE) 50 MCG/ACT nasal spray Place 2 sprays into both nostrils daily. 16 g 1   glycopyrrolate (ROBINUL) 2 MG tablet TAKE 1 TABLET BY MOUTH TWICE DAILY AS NEEDED FOR ABDOMINAL DISCOMFORT (Patient taking differently: Take 1 mg by mouth 2 (two) times daily as needed (abdominal discomfort).) 60 tablet 2   ibuprofen (ADVIL) 600 MG tablet Take 1 tablet (600 mg total) by mouth every 8 (eight) hours as needed. 60 tablet 1   levocetirizine (XYZAL) 5 MG tablet Take 1 tablet (5 mg total) by mouth every evening. 30 tablet 3   metFORMIN (GLUCOPHAGE) 1000 MG tablet TAKE 1 TABLET(1000 MG) BY MOUTH TWICE DAILY WITH A MEAL 30 tablet 2   Multiple Vitamin (MULTIVITAMIN ADULT PO) Take 2 tablets by mouth daily.     Nutritional Supplements (PYCNOGENOL)  300-30 MG CAPS Take 1 capsule by mouth 3 (three) times daily.     pantoprazole (PROTONIX) 40 MG tablet TAKE 1 TABLET(40 MG) BY MOUTH TWICE DAILY BEFORE A MEAL 60 tablet 6   propranolol (INDERAL) 20 MG tablet Take 1 tablet (20 mg total) by mouth 2 (two) times daily. 60 tablet 3   No current facility-administered medications on file prior to visit.    BP 126/74   Pulse 86   Resp 18   Ht 5\' 10"  (1.778 m)   Wt 188 lb 6.4 oz (85.5 kg)   SpO2 98%   BMI 27.03 kg/m        Objective:   Physical Exam  General Mental Status- Alert. General Appearance- Not in acute distress.   Skin General: Color- Normal Color. Moisture- Normal Moisture.  Neck Carotid Arteries- Normal color. Moisture- Normal Moisture. No carotid bruits. No JVD.  Chest and Lung Exam Auscultation: Breath Sounds:-Normal.  Cardiovascular Auscultation:Rythm- Regular. Murmurs & Other Heart Sounds:Auscultation of the heart reveals- No Murmurs.  Abdomen Inspection:-Inspeection Normal. Palpation/Percussion:Note:No mass. Palpation and Percussion of the abdomen reveal- Non Tender, Non Distended + BS, no rebound or guarding.  Neurologic Cranial Nerve exam:- CN III-XII intact(No nystagmus), symmetric smile. Strength:- 5/5 equal and symmetric strength both upper and lower extremities.    Lower ext- see quality metrics.     Assessment & Plan:   Assessment and Plan    Type 2 Diabetes Mellitus: Well controlled with Metformin 1000mg  BID and lifestyle modifications. Recent weight loss and improved diet. Last A1c was 5.9 on 05/02/2022. Home glucose monitoring shows values in the 80s-90s, highest 111. -Check A1c today. -If A1c remains tightly controlled, consider reducing Metformin to 500mg  BID. -Continue home glucose monitoring.  Gastroesophageal Reflux Disease: Improved symptoms with weight loss and dietary changes. -No current use of Protonix. -Continue current healthy diet.  Hyperlipidemia: On Atorvastatin. -Check  lipid panel today.  General Health Maintenance: -Check metabolic panel  today. -Check urine microalbumin today. -Schedule diabetic eye exam. -Administer Tdap vaccine today. -Discuss COVID-19 booster vaccine in September or October.(vs now for reasons discussed)   Follow up date to be determined after lab review   Esperanza Richters, PA-C

## 2022-08-16 ENCOUNTER — Other Ambulatory Visit: Payer: Self-pay | Admitting: Internal Medicine

## 2022-08-17 ENCOUNTER — Other Ambulatory Visit: Payer: Self-pay

## 2022-08-17 ENCOUNTER — Ambulatory Visit: Payer: BC Managed Care – PPO

## 2022-08-17 DIAGNOSIS — R293 Abnormal posture: Secondary | ICD-10-CM | POA: Diagnosis not present

## 2022-08-17 DIAGNOSIS — M25511 Pain in right shoulder: Secondary | ICD-10-CM | POA: Diagnosis not present

## 2022-08-17 DIAGNOSIS — R252 Cramp and spasm: Secondary | ICD-10-CM | POA: Diagnosis not present

## 2022-08-17 DIAGNOSIS — M5416 Radiculopathy, lumbar region: Secondary | ICD-10-CM | POA: Diagnosis not present

## 2022-08-17 DIAGNOSIS — M25512 Pain in left shoulder: Secondary | ICD-10-CM | POA: Diagnosis not present

## 2022-08-17 NOTE — Therapy (Signed)
OUTPATIENT PHYSICAL THERAPY TREATMENT    Patient Name: Anthony Russell MRN: [id number] DOB:[birth year], 42 y.o., male Today's Date: 08/17/2022  END OF SESSION:  PT End of Session - 08/17/22 1451     Visit Number 15    Date for PT Re-Evaluation 09/07/22    Authorization Type [employer]    PT Start Time 1445    PT Stop Time 1530    PT Time Calculation (min) 45 min    Activity Tolerance Patient tolerated treatment well    Behavior During Therapy WFL for tasks assessed/performed              Past Medical History:  Diagnosis Date   Allergy    Allergy, unspecified not elsewhere classified    rx w/ OTC antihistamines PRN   Anxiety    Asthma    Atypical chest pain    recent neg eval w/ normal cxr/ekg   Depression    Diverticulosis    DM (diabetes mellitus) (HCC)    Dyspepsia    on protonix 40mg /d   Esophagitis    GERD (gastroesophageal reflux disease)    on protonix 40mg /d   HTN (hypertension)    Hyperlipidemia    on diet alone   IBS (irritable bowel syndrome)    Lumbar back pain    s/p lumbar laminectomy 2007 by DrCabbell   Migraines    Overweight(278.02)    weight Jan10=246#.[name].he was 225# in 9/07...diet and exercise was discussed   Sleep apnea    not wearing c-pap currently   Past Surgical History:  Procedure Laterality Date   DENTAL SURGERY     LUMBAR LAMINECTOMY  01/30/2005   DrCabbell   TEAR DUCT PROBING     Patient Active Problem List   Diagnosis Date Noted   Lumbar radiculopathy 05/10/2022   Capsulitis of left shoulder 05/10/2022   Sinus tachycardia 12/28/2021   Impingement of knee joint, right 12/20/2021   Achilles tendinitis, right leg 12/20/2021   Rotator cuff tendinitis, right 11/04/2021   Encounter for screening for other metabolic disorders 04/07/2021   Dyspnea on exertion 04/07/2021   Migraines 02/23/2021   Lumbar back pain 02/23/2021   HTN (hypertension) 02/23/2021   Dyspepsia 02/23/2021   DM (diabetes mellitus) (HCC) 02/23/2021   Nasal  septal deviation 02/17/2021   Laryngitis 01/19/2021   Asymmetric tonsils 12/01/2020   Subacromial bursitis of right shoulder joint 05/17/2020   Hypersomnia 11/03/2015   Obesity 11/03/2015   Allergic rhinitis 04/15/2014   Asthma in adult 04/15/2014   GERD (gastroesophageal reflux disease) 04/15/2014   IBS (irritable bowel syndrome) 04/01/2013   Nausea alone 01/15/2013   Diarrhea 01/15/2013   Depression 10/15/2012   Lesion of eyebrow 02/09/2012   Gastroparesis 07/05/2011   Anxiety 07/05/2010   TESTOSTERONE DEFICIENCY 05/26/2008   Hyperlipidemia 02/06/2008   Facet arthropathy, lumbar 02/06/2008   Atypical chest pain 02/06/2008    PCP: Esperanza Richters, PA-C   REFERRING PROVIDER: Lenda Kelp, MD   REFERRING DIAG:  54.16 (ICD-10-CM) - Lumbar radiculopathy  M75.81 (ICD-10-CM) - Rotator cuff tendinitis, right  M77.8 (ICD-10-CM) - Capsulitis of left shoulder    Rationale for Evaluation and Treatment: Rehabilitation  THERAPY DIAG:  Radiculopathy, lumbar region  Acute pain of both shoulders  Cramp and spasm  Abnormal posture  ONSET DATE: R leg pain several years; Shoulders - months  SUBJECTIVE:  SUBJECTIVE STATEMENT: Pain R knee with sustained squatting , kneeling,L shoulder hurting with specific stretching. Had tetanus shot which made arm sore for a few days  PERTINENT HISTORY:  DM, Lumbar laminectomy L 2007  PAIN:  Are you having pain? No 2 initally inR knee when squatting then goes up to 8  PRECAUTIONS: None  WEIGHT BEARING RESTRICTIONS: No  FALLS:  Has patient fallen in last 6 months? No  LIVING ENVIRONMENT: Lives with: lives alone Lives in: House/apartment Stairs: Yes: External: 2 flights steps; can reach both Has following equipment at home: None  OCCUPATION:  Sport and exercise psychologist, works at home; has sit down and stand up desk  PLOF: Independent  PATIENT GOALS: get regular movement back in back, be able to exercise without pain and pet my cats  NEXT MD VISIT: 06/19/22  OBJECTIVE:   DIAGNOSTIC FINDINGS:  XR Left - negative XR Right - Mild glenohumeral degenerative changes without loss of joint space.  [place] Right - thickening of the coracohumeral ligament and the deltoid ligament   PATIENT SURVEYS:  Modified Oswestry 8 / 50 = 16.0 %  Quick Dash 27.3 / 100 = 27.3 % FOTO TBD  SCREENING FOR RED FLAGS: Neg  COGNITION: Overall cognitive status: Within functional limits for tasks assessed     SENSATION: WFL  MUSCLE LENGTH: Marked tightness in bil HS, quads, hip flexors, hip rotators and lumbar  POSTURE: rounded shoulders, forward head, and tight QL with depressed R scapula; left lateral shift  PALPATION: R gluteus medius and lumbar R>L  LUMBAR ROM:   AROM eval 07/20/22 08/14/22  Flexion Limited by HS to knees Right below knees Mid shin  Extension 75% limited 60% limit- pain 60% limit  Right lateral flexion 50% limited 40% limit 30% limit  Left lateral flexion 50% limited 40% limit 30% limit  Right rotation 25% limited 25% limit   Left rotation 25% limited 25% limit    (Blank rows = not tested)  LOWER EXTREMITY ROM:   marked flexibility deficits affecting full range of bil hip and knee joints  LOWER EXTREMITY MMT:  Grossly 5/5 in  B LE   LUMBAR SPECIAL TESTS:  Straight leg raise test: Positive and Slump test: Positive  SHOULDER SPECIAL TESTS: Impingement tests: Neer impingement test: negative and Hawkins/Kennedy impingement test: positive  R  Rotator cuff assessment: neg bil  UPPER EXTREMITY ROM: Functional bil shoulder ROM  Active ROM Right eval Left eval  Shoulder flexion    Shoulder extension    Shoulder abduction    Shoulder adduction    Shoulder extension    Shoulder internal rotation    Shoulder external rotation     Elbow flexion    Elbow extension    Wrist flexion    Wrist extension    Wrist ulnar deviation    Wrist radial deviation    Wrist pronation    Wrist supination     (Blank rows = not tested)  UPPER EXTREMITY MMT: Grossly 5/5 bil except R  ER 4+5 with mild pain   TODAY'S TREATMENT:  DATE: 08/17/22:  Nustep level 5 x 6 min Seated leg extension unilateral 5#, 10 x 3 each Seated knee flexion B 25#, 10 x 3 TRX strap,: B shoulder rows TRX: Deep squats TRX: Standing lateral trunk stretch with shoulders overhead Prone stretch hip flexion, prone knee bend, 45 sec holds, each side, therapist providing overpressure manually and stabilization  Added prone hip flexor stretch, with 10 reps of press ups, on each side    08/14/22 Bike L3x71min Lumbar AROM Standing I,T,Y back to wall on pool noodle x 10 each way Wall angels back to wall x 10 Supine SLR 2x10 bil Supine bridge with march x 10  Supine bridge with step out x 10 Bird dog 2x10   08/07/22 Bike L3x37min R hip ADD/ER stretch hooklying 2x30 sec R hip add stretch supine w/ strap 3x15 sec S/L hip abduction x 15 R/L Bridge with blue TB 2x15 S/L clamshells blue TB 2x10 Standing shld ext w/ cable  I, T, Y x 10 at doorframe STM to R hip adductors, and lateral patellar ligament   07/31/22 Nustep L5 x 6 min  Open books x 10 bil  Windshield wipers x 10  On foam roller - back stroke x 10 for thoracic mobilization, snow angels x 10, pec stretch x 1 min Hip flexor stretch x 1 min bil  Double leg stretch on wall x 3 min for hamstrings.  Standing L -stretch at counter Standing low back stretch at counter x 10   07/27/22: Nustep level 5 x6 min Quadriped alt arm amd leg lifts Seated rows 45# Prone for foam roller IASTM thoracic region and gluteals Supine stretch over 1/2 foam roller with pecs stretch at 90 degrees  horiz abd and with B shoulder flexion Seated for lat pull downs with 45# 2 sets 15 reps Standing hip abd with cable,5#, 10 x 2 sets Leg press, B hips, 25# 10 x 3 Prone props, added pillow under abdomen to allow for better ant hip and trunk relaxation, inst to wt shift side to side to improve thoracic mobility and scapular movement Prone for manual stretching by PT for B ant hip flexors,40 sec holds each side x 3 bouts each.  07/25/22 Therapeutic Exercise: to improve strength and mobility.  Demo, verbal and tactile cues throughout for technique.  Bike L3x67min Wall angels 2x10 with chin tucks Standing shoulder flexion at wall with chin tuck 2# weights x 10  S/L shoulder abduction 2# 2x10 Bil S/L shoulder ER 2x10 LUE; x 10 RUE Standing pallof press GTB x 10 bil Standing shoulder ext GTB 2x10  07/20/22 Therapeutic Exercise: to improve strength and mobility.  Demo, verbal and tactile cues throughout for technique.  Bike L3x67min Assess lumbar AROM Wall angels 2x10 Lumbar extension standing at wall x 10  Wall push ups 2x10 Standing hip extension 2x10 at wall  Staggered stance wall slide into flexion x 10  Seated hamstring stretch 2x30 sec leg propped on mat table Prone on elbows with neck extension x 10  Prone quad stretch 2x30 sec w/ strap  Manual Therapy: to decrease muscle spasm, pain and improve mobility IASTM w/ foam roll to thoracolumbar PS in prone   PATIENT EDUCATION:  Education details:  Person educated: Patient Education method: Programmer, multimedia, Facilities manager, and Handouts Education comprehension: verbalized understanding and returned demonstration  HOME EXERCISE PROGRAM: Access Code: [email or web address] URL: [email or web address] Date: 07/31/2022 Prepared by: Harrie Foreman  Exercises - Supine Lower Trunk Rotation  - 2 x daily - 7 x weekly - 1  sets - 5 reps - 10 sec hold - Cat Cow to Child's Pose  - 1 x daily - 7 x weekly - 1-3 sets - 10 reps - Seated Scapular  Retraction  - 1 x daily - 7 x weekly - 1-3 sets - 10 reps - 2-3 sec hold - Doorway Pec Stretch at 90 Degrees Abduction  - 1 x daily - 7 x weekly - 1 sets - 3 reps - 30-60 seconds hold - Supine Bridge with Resistance Band  - 1 x daily - 7 x weekly - 3 sets - 10 reps - Clamshell with Resistance  - 1 x daily - 7 x weekly - 3 sets - 10 reps - Shoulder External Rotation and Scapular Retraction with Resistance  - 1 x daily - 7 x weekly - 3 sets - 10 reps - Prone on Elbows Stretch  - 1 x daily - 7 x weekly - 3 sets - 10 reps - Hip Extension with Resistance Loop  - 1 x daily - 7 x weekly - 3 sets - 10 reps - Scapular Stability on Wall With Pillowcase (Hemiplegia)  - 1 x daily - 7 x weekly - 3 sets - 10 reps - Supine Hamstring Stretch with Strap  - 1 x daily - 7 x weekly - 3 sets - 30 sec hold - Supine Hip Internal and External Rotation  - 1 x daily - 7 x weekly - 1 sets - 10 reps - Sidelying Open Book Thoracic Lumbar Rotation and Extension  - 1 x daily - 7 x weekly - 1 sets - 10 reps - Modified Thomas Stretch  - 1 x daily - 7 x weekly - 1 sets - 3 reps - 1 min  hold - Double Leg Hamstring Stretch at Wall  - 1 x daily - 7 x weekly - 1 sets - 1 reps - 5 min  hold - Standing 'L' Stretch at Counter  - 3 x daily - 7 x weekly - 1 sets - 10 reps - Standing Lumbar Extension with Counter  - 3 x daily - 7 x weekly - 1 sets - 10 reps - Thoracic Foam Roll Mobilization Backstroke  - 1 x daily - 7 x weekly - 2 sets - 10 reps - [employer] on Foam Roll  - 1 x daily - 7 x weekly - 2 sets - 10 reps - Supine Static Chest Stretch on Foam Roll  - 1 x daily - 7 x weekly - 1 sets - 1 reps - 1 min hold   ASSESSMENT:  CLINICAL IMPRESSION:  Progressed exercise to tolerance with no complaints. Provided instruction and cuing throughout session to correct form. Added more quads strengthening due to his pain with deep squats.  No pain with resistance training for quads.  Still responding well to skilled PT intervention.   [place] DR[facility] continues to demonstrate potential for improvement and would benefit from continued skilled therapy to address impairments.    OBJECTIVE IMPAIRMENTS: decreased activity tolerance, decreased mobility, decreased ROM, decreased strength, increased muscle spasms, impaired flexibility, impaired UE functional use, postural dysfunction, and pain.   ACTIVITY LIMITATIONS: standing and locomotion level  PARTICIPATION LIMITATIONS:  walking for exercise  PERSONAL FACTORS: Fitness, Profession, Time since onset of injury/illness/exacerbation, and 1-2 comorbidities: DM, previous back surgery  are also affecting patient's functional outcome.   REHAB POTENTIAL: Excellent  CLINICAL DECISION MAKING: Evolving/moderate complexity  EVALUATION COMPLEXITY: Moderate   GOALS: Goals reviewed with patient? Yes  SHORT TERM GOALS: Target date: 07/03/2022   Patient will be independent with initial HEP.  Baseline:  Goal status: MET  2.  Patient will report ability to centralize radicular symptoms with therex.  Baseline:  Goal status: IN PROGRESS- 06/30/22  3.  Complete FOTO for back by 2nd visit Baseline:  Goal status: MET  LONG TERM GOALS: Target date: 07/31/2022 extended to 09/07/22  Patient will be independent with advanced/ongoing HEP to improve outcomes and carryover.  Baseline:  Goal status: IN PROGRESS 07/27/22  has a plethora of home exercises  2.  Patient will report 75% improvement in low back radicular pain to improve QOL.  Baseline:  Goal status: IN PROGRESS- 07/25/22 (70% improvement)  3.  Patient will demonstrate full/functional pain free lumbar ROM to perform ADLs.   Baseline:  Goal status: IN PROGRESS- 07/20/22  4.  Patient will report 2/50 on Modified Oswestry to demonstrate improved functional ability.  Baseline: 8 / 50 = 16.0 % Goal status: IN PROGRESS 07/27/22: na today  5.  Patient will tolerate 30 min of walking without increased pain. Baseline:  Goal status: IN PROGRESS-  08/14/22 pt reports being able to walk 25- 30 min no pain  6.  Patient to demonstrate ability to achieve and maintain good spinal alignment/posturing and body mechanics needed for daily activities. Baseline:  Goal status: IN PROGRESS 07/27/22: Improving  7.  Patient will report 75% improvement in bil shoulder pain with petting cats and reaching outward with ADLS. Baseline:  Goal status: IN PROGRESS- 07/25/22 (50% improvement)  8  Patient will report 17 on Quick Dash to demonstrate improved functional ability.  Baseline: 27.3 / 100 = 27.3 % Goal status: IN PROGRESS 6.8%,   PLAN:  PT FREQUENCY: 2x/week  PT DURATION: 8 weeks  PLANNED INTERVENTIONS: Therapeutic exercises, Therapeutic activity, Neuromuscular re-education, Patient/Family education, Self Care, Joint mobilization, Aquatic Therapy, Dry Needling, Electrical stimulation, Spinal manipulation, Spinal mobilization, Cryotherapy, Moist heat, Taping, Traction, Ultrasound, Ionotophoresis 4mg /ml Dexamethasone, and Manual therapy.  PLAN FOR NEXT SESSION: extension exercises; ant hip stretch, gluteal strengthening, Postural strengthening and core stability   Rise Traeger L Alson Mcpheeters, PT DPT [facility] 08/17/2022, 3:38 PM

## 2022-08-21 ENCOUNTER — Ambulatory Visit: Payer: BC Managed Care – PPO

## 2022-08-21 DIAGNOSIS — M5416 Radiculopathy, lumbar region: Secondary | ICD-10-CM | POA: Diagnosis not present

## 2022-08-21 DIAGNOSIS — M25511 Pain in right shoulder: Secondary | ICD-10-CM | POA: Diagnosis not present

## 2022-08-21 DIAGNOSIS — M25512 Pain in left shoulder: Secondary | ICD-10-CM | POA: Diagnosis not present

## 2022-08-21 DIAGNOSIS — R252 Cramp and spasm: Secondary | ICD-10-CM | POA: Diagnosis not present

## 2022-08-21 DIAGNOSIS — R293 Abnormal posture: Secondary | ICD-10-CM

## 2022-08-21 NOTE — Therapy (Signed)
OUTPATIENT PHYSICAL THERAPY TREATMENT    Patient Name: Anthony Russell MRN: [id number] DOB:06/22/1980, 42 y.o., male Today's Date: 08/21/2022  END OF SESSION:  PT End of Session - 08/21/22 1527     Visit Number 16    Date for PT Re-Evaluation 09/07/22    Authorization Type [employer]    PT Start Time 1446    PT Stop Time 1527    PT Time Calculation (min) 41 min    Activity Tolerance Patient tolerated treatment well    Behavior During Therapy WFL for tasks assessed/performed               Past Medical History:  Diagnosis Date   Allergy    Allergy, unspecified not elsewhere classified    rx w/ OTC antihistamines PRN   Anxiety    Asthma    Atypical chest pain    recent neg eval w/ normal cxr/ekg   Depression    Diverticulosis    DM (diabetes mellitus) (HCC)    Dyspepsia    on protonix 40mg /d   Esophagitis    GERD (gastroesophageal reflux disease)    on protonix 40mg /d   HTN (hypertension)    Hyperlipidemia    on diet alone   IBS (irritable bowel syndrome)    Lumbar back pain    s/p lumbar laminectomy 2007 by DrCabbell   Migraines    Overweight(278.02)    weight Jan10=246#.[name].he was 225# in 9/07...diet and exercise was discussed   Sleep apnea    not wearing c-pap currently   Past Surgical History:  Procedure Laterality Date   DENTAL SURGERY     LUMBAR LAMINECTOMY  01/30/2005   DrCabbell   TEAR DUCT PROBING     Patient Active Problem List   Diagnosis Date Noted   Lumbar radiculopathy 05/10/2022   Capsulitis of left shoulder 05/10/2022   Sinus tachycardia 12/28/2021   Impingement of knee joint, right 12/20/2021   Achilles tendinitis, right leg 12/20/2021   Rotator cuff tendinitis, right 11/04/2021   Encounter for screening for other metabolic disorders 04/07/2021   Dyspnea on exertion 04/07/2021   Migraines 02/23/2021   Lumbar back pain 02/23/2021   HTN (hypertension) 02/23/2021   Dyspepsia 02/23/2021   DM (diabetes mellitus) (HCC) 02/23/2021   Nasal  septal deviation 02/17/2021   Laryngitis 01/19/2021   Asymmetric tonsils 12/01/2020   Subacromial bursitis of right shoulder joint 05/17/2020   Hypersomnia 11/03/2015   Obesity 11/03/2015   Allergic rhinitis 04/15/2014   Asthma in adult 04/15/2014   GERD (gastroesophageal reflux disease) 04/15/2014   IBS (irritable bowel syndrome) 04/01/2013   Nausea alone 01/15/2013   Diarrhea 01/15/2013   Depression 10/15/2012   Lesion of eyebrow 02/09/2012   Gastroparesis 07/05/2011   Anxiety 07/05/2010   TESTOSTERONE DEFICIENCY 05/26/2008   Hyperlipidemia 02/06/2008   Facet arthropathy, lumbar 02/06/2008   Atypical chest pain 02/06/2008    PCP: Esperanza Richters, PA-C   REFERRING PROVIDER: Lenda Kelp, MD   REFERRING DIAG:  54.16 (ICD-10-CM) - Lumbar radiculopathy  M75.81 (ICD-10-CM) - Rotator cuff tendinitis, right  M77.8 (ICD-10-CM) - Capsulitis of left shoulder    Rationale for Evaluation and Treatment: Rehabilitation  THERAPY DIAG:  Radiculopathy, lumbar region  Acute pain of both shoulders  Cramp and spasm  Abnormal posture  ONSET DATE: R leg pain several years; Shoulders - months  SUBJECTIVE:  SUBJECTIVE STATEMENT: Pt reports some R knee pain but not bad.  PERTINENT HISTORY:  DM, Lumbar laminectomy L 2007  PAIN:  Are you having pain? No 2 initally inR knee when squatting then goes up to 8  PRECAUTIONS: None  WEIGHT BEARING RESTRICTIONS: No  FALLS:  Has patient fallen in last 6 months? No  LIVING ENVIRONMENT: Lives with: lives alone Lives in: House/apartment Stairs: Yes: External: 2 flights steps; can reach both Has following equipment at home: None  OCCUPATION: Sport and exercise psychologist, works at home; has sit down and stand up desk  PLOF: Independent  PATIENT GOALS: get  regular movement back in back, be able to exercise without pain and pet my cats  NEXT MD VISIT: 06/19/22  OBJECTIVE:   DIAGNOSTIC FINDINGS:  XR Left - negative XR Right - Mild glenohumeral degenerative changes without loss of joint space.  [place] Right - thickening of the coracohumeral ligament and the deltoid ligament   PATIENT SURVEYS:  Modified Oswestry 8 / 50 = 16.0 %  Quick Dash 27.3 / 100 = 27.3 % FOTO TBD  SCREENING FOR RED FLAGS: Neg  COGNITION: Overall cognitive status: Within functional limits for tasks assessed     SENSATION: WFL  MUSCLE LENGTH: Marked tightness in bil HS, quads, hip flexors, hip rotators and lumbar  POSTURE: rounded shoulders, forward head, and tight QL with depressed R scapula; left lateral shift  PALPATION: R gluteus medius and lumbar R>L  LUMBAR ROM:   AROM eval 07/20/22 08/14/22  Flexion Limited by HS to knees Right below knees Mid shin  Extension 75% limited 60% limit- pain 60% limit  Right lateral flexion 50% limited 40% limit 30% limit  Left lateral flexion 50% limited 40% limit 30% limit  Right rotation 25% limited 25% limit   Left rotation 25% limited 25% limit    (Blank rows = not tested)  LOWER EXTREMITY ROM:   marked flexibility deficits affecting full range of bil hip and knee joints  LOWER EXTREMITY MMT:  Grossly 5/5 in  B LE   LUMBAR SPECIAL TESTS:  Straight leg raise test: Positive and Slump test: Positive  SHOULDER SPECIAL TESTS: Impingement tests: Neer impingement test: negative and Hawkins/Kennedy impingement test: positive  R  Rotator cuff assessment: neg bil  UPPER EXTREMITY ROM: Functional bil shoulder ROM  Active ROM Right eval Left eval  Shoulder flexion    Shoulder extension    Shoulder abduction    Shoulder adduction    Shoulder extension    Shoulder internal rotation    Shoulder external rotation    Elbow flexion    Elbow extension    Wrist flexion    Wrist extension    Wrist ulnar deviation     Wrist radial deviation    Wrist pronation    Wrist supination     (Blank rows = not tested)  UPPER EXTREMITY MMT: Grossly 5/5 bil except R  ER 4+5 with mild pain   TODAY'S TREATMENT:  DATE: 08/21/22:  Nustep level 5 x 6 min TRX deep squat x 15 TRX row increased ROM  from last time 2x10 TRX hip drop x 10  Counter push up x 10  Quadruped hip raises 2x10 Prone T (horizontal ABD) 2x10 Prone shoulder ext 2x10 08/17/22:  Nustep level 5 x 6 min Seated leg extension unilateral 5#, 10 x 3 each Seated knee flexion B 25#, 10 x 3 TRX strap,: B shoulder rows TRX: Deep squats TRX: Standing lateral trunk stretch with shoulders overhead Prone stretch hip flexion, prone knee bend, 45 sec holds, each side, therapist providing overpressure manually and stabilization  Added prone hip flexor stretch, with 10 reps of press ups, on each side    08/14/22 Bike L3x40min Lumbar AROM Standing I,T,Y back to wall on pool noodle x 10 each way Wall angels back to wall x 10 Supine SLR 2x10 bil Supine bridge with march x 10  Supine bridge with step out x 10 Bird dog 2x10   08/07/22 Bike L3x14min R hip ADD/ER stretch hooklying 2x30 sec R hip add stretch supine w/ strap 3x15 sec S/L hip abduction x 15 R/L Bridge with blue TB 2x15 S/L clamshells blue TB 2x10 Standing shld ext w/ cable  I, T, Y x 10 at doorframe STM to R hip adductors, and lateral patellar ligament   07/31/22 Nustep L5 x 6 min  Open books x 10 bil  Windshield wipers x 10  On foam roller - back stroke x 10 for thoracic mobilization, snow angels x 10, pec stretch x 1 min Hip flexor stretch x 1 min bil  Double leg stretch on wall x 3 min for hamstrings.  Standing L -stretch at counter Standing low back stretch at counter x 10   07/27/22: Nustep level 5 x6 min Quadriped alt arm amd leg lifts Seated rows  45# Prone for foam roller IASTM thoracic region and gluteals Supine stretch over 1/2 foam roller with pecs stretch at 90 degrees horiz abd and with B shoulder flexion Seated for lat pull downs with 45# 2 sets 15 reps Standing hip abd with cable,5#, 10 x 2 sets Leg press, B hips, 25# 10 x 3 Prone props, added pillow under abdomen to allow for better ant hip and trunk relaxation, inst to wt shift side to side to improve thoracic mobility and scapular movement Prone for manual stretching by PT for B ant hip flexors,40 sec holds each side x 3 bouts each.  07/25/22 Therapeutic Exercise: to improve strength and mobility.  Demo, verbal and tactile cues throughout for technique.  Bike L3x35min Wall angels 2x10 with chin tucks Standing shoulder flexion at wall with chin tuck 2# weights x 10  S/L shoulder abduction 2# 2x10 Bil S/L shoulder ER 2x10 LUE; x 10 RUE Standing pallof press GTB x 10 bil Standing shoulder ext GTB 2x10  07/20/22 Therapeutic Exercise: to improve strength and mobility.  Demo, verbal and tactile cues throughout for technique.  Bike L3x79min Assess lumbar AROM Wall angels 2x10 Lumbar extension standing at wall x 10  Wall push ups 2x10 Standing hip extension 2x10 at wall  Staggered stance wall slide into flexion x 10  Seated hamstring stretch 2x30 sec leg propped on mat table Prone on elbows with neck extension x 10  Prone quad stretch 2x30 sec w/ strap  Manual Therapy: to decrease muscle spasm, pain and improve mobility IASTM w/ foam roll to thoracolumbar PS in prone   PATIENT EDUCATION:  Education details:  Person educated:  Patient Education method: Explanation, Demonstration, and Handouts Education comprehension: verbalized understanding and returned demonstration  HOME EXERCISE PROGRAM: Access Code: [email or web address] URL: [email or web address] Date: 07/31/2022 Prepared by: Harrie Foreman  Exercises - Supine Lower Trunk Rotation  - 2 x daily - 7 x  weekly - 1 sets - 5 reps - 10 sec hold - Cat Cow to Child's Pose  - 1 x daily - 7 x weekly - 1-3 sets - 10 reps - Seated Scapular Retraction  - 1 x daily - 7 x weekly - 1-3 sets - 10 reps - 2-3 sec hold - Doorway Pec Stretch at 90 Degrees Abduction  - 1 x daily - 7 x weekly - 1 sets - 3 reps - 30-60 seconds hold - Supine Bridge with Resistance Band  - 1 x daily - 7 x weekly - 3 sets - 10 reps - Clamshell with Resistance  - 1 x daily - 7 x weekly - 3 sets - 10 reps - Shoulder External Rotation and Scapular Retraction with Resistance  - 1 x daily - 7 x weekly - 3 sets - 10 reps - Prone on Elbows Stretch  - 1 x daily - 7 x weekly - 3 sets - 10 reps - Hip Extension with Resistance Loop  - 1 x daily - 7 x weekly - 3 sets - 10 reps - Scapular Stability on Wall With Pillowcase (Hemiplegia)  - 1 x daily - 7 x weekly - 3 sets - 10 reps - Supine Hamstring Stretch with Strap  - 1 x daily - 7 x weekly - 3 sets - 30 sec hold - Supine Hip Internal and External Rotation  - 1 x daily - 7 x weekly - 1 sets - 10 reps - Sidelying Open Book Thoracic Lumbar Rotation and Extension  - 1 x daily - 7 x weekly - 1 sets - 10 reps - Modified Thomas Stretch  - 1 x daily - 7 x weekly - 1 sets - 3 reps - 1 min  hold - Double Leg Hamstring Stretch at Wall  - 1 x daily - 7 x weekly - 1 sets - 1 reps - 5 min  hold - Standing 'L' Stretch at [employer]  - 3 x daily - 7 x weekly - 1 sets - 10 reps - Standing Lumbar Extension with Counter  - 3 x daily - 7 x weekly - 1 sets - 10 reps - Thoracic Foam Roll Mobilization Backstroke  - 1 x daily - 7 x weekly - 2 sets - 10 reps - [employer] on Foam Roll  - 1 x daily - 7 x weekly - 2 sets - 10 reps - Supine Static Chest Stretch on Foam Roll  - 1 x daily - 7 x weekly - 1 sets - 1 reps - 1 min hold   ASSESSMENT:  CLINICAL IMPRESSION:   Pt responded well to treatment. We continued to work on core stability and postural strengthening. Cues provided as necessary to ensure the proper muscles  are targeted. Anthony Russell is showing better upright stance and postural awareness, still has trouble from R knee occasionally. Goal for quick dash met. Anthony Russell continues to demonstrate potential for improvement and would benefit from continued skilled therapy to address impairments.    OBJECTIVE IMPAIRMENTS: decreased activity tolerance, decreased mobility, decreased ROM, decreased strength, increased muscle spasms, impaired flexibility, impaired UE functional use, postural dysfunction, and pain.   ACTIVITY LIMITATIONS: standing and locomotion level  PARTICIPATION LIMITATIONS:  walking  for exercise  PERSONAL FACTORS: Fitness, Profession, Time since onset of injury/illness/exacerbation, and 1-2 comorbidities: DM, previous back surgery  are also affecting patient's functional outcome.   REHAB POTENTIAL: Excellent  CLINICAL DECISION MAKING: Evolving/moderate complexity  EVALUATION COMPLEXITY: Moderate   GOALS: Goals reviewed with patient? Yes  SHORT TERM GOALS: Target date: 07/03/2022   Patient will be independent with initial HEP.  Baseline:  Goal status: MET  2.  Patient will report ability to centralize radicular symptoms with therex.  Baseline:  Goal status: IN PROGRESS- 06/30/22  3.  Complete FOTO for back by 2nd visit Baseline:  Goal status: MET  LONG TERM GOALS: Target date: 07/31/2022 extended to 09/07/22  Patient will be independent with advanced/ongoing HEP to improve outcomes and carryover.  Baseline:  Goal status: IN PROGRESS 07/27/22  has a plethora of home exercises  2.  Patient will report 75% improvement in low back radicular pain to improve QOL.  Baseline:  Goal status: IN PROGRESS- 07/25/22 (70% improvement)  3.  Patient will demonstrate full/functional pain free lumbar ROM to perform ADLs.   Baseline:  Goal status: IN PROGRESS- 07/20/22  4.  Patient will report 2/50 on Modified Oswestry to demonstrate improved functional ability.  Baseline: 8 / 50 = 16.0  % Goal status: IN PROGRESS 07/27/22: na today  5.  Patient will tolerate 30 min of walking without increased pain. Baseline:  Goal status: IN PROGRESS- 08/14/22 pt reports being able to walk 25- 30 min no pain  6.  Patient to demonstrate ability to achieve and maintain good spinal alignment/posturing and body mechanics needed for daily activities. Baseline:  Goal status: IN PROGRESS 07/27/22: Improving  7.  Patient will report 75% improvement in bil shoulder pain with petting cats and reaching outward with ADLS. Baseline:  Goal status: IN PROGRESS- 07/25/22 (50% improvement)  8  Patient will report 17 on Quick Dash to demonstrate improved functional ability.  Baseline: 27.3 / 100 = 27.3 % Goal status: MET QuickDASH Score: 11.4 / 100 = 11.4 % 08/21/22  PLAN:  PT FREQUENCY: 2x/week  PT DURATION: 8 weeks  PLANNED INTERVENTIONS: Therapeutic exercises, Therapeutic activity, Neuromuscular re-education, Patient/Family education, Self Care, Joint mobilization, Aquatic Therapy, Dry Needling, Electrical stimulation, Spinal manipulation, Spinal mobilization, Cryotherapy, Moist heat, Taping, Traction, Ultrasound, Ionotophoresis 4mg /ml Dexamethasone, and Manual therapy.  PLAN FOR NEXT SESSION: extension exercises; ant hip stretch, gluteal strengthening, Postural strengthening and core stability   Darleene Cleaver, PTA 08/21/2022, 3:50 PM

## 2022-08-24 ENCOUNTER — Other Ambulatory Visit: Payer: Self-pay

## 2022-08-24 ENCOUNTER — Ambulatory Visit: Payer: BC Managed Care – PPO

## 2022-08-24 DIAGNOSIS — M25512 Pain in left shoulder: Secondary | ICD-10-CM | POA: Diagnosis not present

## 2022-08-24 DIAGNOSIS — R293 Abnormal posture: Secondary | ICD-10-CM

## 2022-08-24 DIAGNOSIS — M5416 Radiculopathy, lumbar region: Secondary | ICD-10-CM | POA: Diagnosis not present

## 2022-08-24 DIAGNOSIS — R252 Cramp and spasm: Secondary | ICD-10-CM | POA: Diagnosis not present

## 2022-08-24 DIAGNOSIS — M25511 Pain in right shoulder: Secondary | ICD-10-CM | POA: Diagnosis not present

## 2022-08-24 NOTE — Therapy (Signed)
OUTPATIENT PHYSICAL THERAPY TREATMENT    Patient Name: Anthony Russell MRN: [id number] DOB:Aug 16, 1980, 42 y.o., male Today's Date: 08/24/2022  END OF SESSION:  PT End of Session - 08/24/22 1510     Visit Number 17    Date for PT Re-Evaluation 09/07/22    Authorization Type [employer]    PT Start Time 1448    PT Stop Time 1530    PT Time Calculation (min) 42 min    Activity Tolerance Patient tolerated treatment well    Behavior During Therapy WFL for tasks assessed/performed                Past Medical History:  Diagnosis Date   Allergy    Allergy, unspecified not elsewhere classified    rx w/ OTC antihistamines PRN   Anxiety    Asthma    Atypical chest pain    recent neg eval w/ normal cxr/ekg   Depression    Diverticulosis    DM (diabetes mellitus) (HCC)    Dyspepsia    on protonix 40mg /d   Esophagitis    GERD (gastroesophageal reflux disease)    on protonix 40mg /d   HTN (hypertension)    Hyperlipidemia    on diet alone   IBS (irritable bowel syndrome)    Lumbar back pain    s/p lumbar laminectomy 2007 by DrCabbell   Migraines    Overweight(278.02)    weight Jan10=246#.[name].he was 225# in 9/07...diet and exercise was discussed   Sleep apnea    not wearing c-pap currently   Past Surgical History:  Procedure Laterality Date   DENTAL SURGERY     LUMBAR LAMINECTOMY  01/30/2005   DrCabbell   TEAR DUCT PROBING     Patient Active Problem List   Diagnosis Date Noted   Lumbar radiculopathy 05/10/2022   Capsulitis of left shoulder 05/10/2022   Sinus tachycardia 12/28/2021   Impingement of knee joint, right 12/20/2021   Achilles tendinitis, right leg 12/20/2021   Rotator cuff tendinitis, right 11/04/2021   Encounter for screening for other metabolic disorders 04/07/2021   Dyspnea on exertion 04/07/2021   Migraines 02/23/2021   Lumbar back pain 02/23/2021   HTN (hypertension) 02/23/2021   Dyspepsia 02/23/2021   DM (diabetes mellitus) (HCC) 02/23/2021   Nasal  septal deviation 02/17/2021   Laryngitis 01/19/2021   Asymmetric tonsils 12/01/2020   Subacromial bursitis of right shoulder joint 05/17/2020   Hypersomnia 11/03/2015   Obesity 11/03/2015   Allergic rhinitis 04/15/2014   Asthma in adult 04/15/2014   GERD (gastroesophageal reflux disease) 04/15/2014   IBS (irritable bowel syndrome) 04/01/2013   Nausea alone 01/15/2013   Diarrhea 01/15/2013   Depression 10/15/2012   Lesion of eyebrow 02/09/2012   Gastroparesis 07/05/2011   Anxiety 07/05/2010   TESTOSTERONE DEFICIENCY 05/26/2008   Hyperlipidemia 02/06/2008   Facet arthropathy, lumbar 02/06/2008   Atypical chest pain 02/06/2008    PCP: Esperanza Richters, PA-C   REFERRING PROVIDER: Lenda Kelp, MD   REFERRING DIAG:  54.16 (ICD-10-CM) - Lumbar radiculopathy  M75.81 (ICD-10-CM) - Rotator cuff tendinitis, right  M77.8 (ICD-10-CM) - Capsulitis of left shoulder    Rationale for Evaluation and Treatment: Rehabilitation  THERAPY DIAG:  Radiculopathy, lumbar region  Acute pain of both shoulders  Cramp and spasm  Abnormal posture  ONSET DATE: R leg pain several years; Shoulders - months  SUBJECTIVE:  SUBJECTIVE STATEMENT: Pt reports some R knee pain but not bad.  PERTINENT HISTORY:  DM, Lumbar laminectomy L 2007  PAIN:  Are you having pain? No 2 initally inR knee when squatting then goes up to 8  PRECAUTIONS: None  WEIGHT BEARING RESTRICTIONS: No  FALLS:  Has patient fallen in last 6 months? No  LIVING ENVIRONMENT: Lives with: lives alone Lives in: House/apartment Stairs: Yes: External: 2 flights steps; can reach both Has following equipment at home: None  OCCUPATION: Sport and exercise psychologist, works at home; has sit down and stand up desk  PLOF: Independent  PATIENT GOALS: get  regular movement back in back, be able to exercise without pain and pet my cats  NEXT MD VISIT: 06/19/22  OBJECTIVE:   DIAGNOSTIC FINDINGS:  XR Left - negative XR Right - Mild glenohumeral degenerative changes without loss of joint space.  [place] Right - thickening of the coracohumeral ligament and the deltoid ligament   PATIENT SURVEYS:  Modified Oswestry 8 / 50 = 16.0 %  Quick Dash 27.3 / 100 = 27.3 % FOTO TBD  SCREENING FOR RED FLAGS: Neg  COGNITION: Overall cognitive status: Within functional limits for tasks assessed     SENSATION: WFL  MUSCLE LENGTH: Marked tightness in bil HS, quads, hip flexors, hip rotators and lumbar  POSTURE: rounded shoulders, forward head, and tight QL with depressed R scapula; left lateral shift  PALPATION: R gluteus medius and lumbar R>L  LUMBAR ROM:   AROM eval 07/20/22 08/14/22  Flexion Limited by HS to knees Right below knees Mid shin  Extension 75% limited 60% limit- pain 60% limit  Right lateral flexion 50% limited 40% limit 30% limit  Left lateral flexion 50% limited 40% limit 30% limit  Right rotation 25% limited 25% limit   Left rotation 25% limited 25% limit    (Blank rows = not tested)  LOWER EXTREMITY ROM:   marked flexibility deficits affecting full range of bil hip and knee joints  LOWER EXTREMITY MMT:  Grossly 5/5 in  B LE   LUMBAR SPECIAL TESTS:  Straight leg raise test: Positive and Slump test: Positive  SHOULDER SPECIAL TESTS: Impingement tests: Neer impingement test: negative and Hawkins/Kennedy impingement test: positive  R  Rotator cuff assessment: neg bil  UPPER EXTREMITY ROM: Functional bil shoulder ROM  Active ROM Right eval Left eval  Shoulder flexion    Shoulder extension    Shoulder abduction    Shoulder adduction    Shoulder extension    Shoulder internal rotation    Shoulder external rotation    Elbow flexion    Elbow extension    Wrist flexion    Wrist extension    Wrist ulnar deviation     Wrist radial deviation    Wrist pronation    Wrist supination     (Blank rows = not tested)  UPPER EXTREMITY MMT: Grossly 5/5 bil except R  ER 4+5 with mild pain   TODAY'S TREATMENT:  DATE: Recumbent bike level 4 x 6 min,  TRX row 2 x 10 TRX hip drop 10 each side TRX deep squats, 15 x  TRX combo of triceps press and lat pull overs 10 x 2 B hamstring curls 35#, 10  Leg extension 5#, each leg, 10 x 2 sets  Prone for PA mobs mid thoracic spine and post ribs to tolerance Prone manual stretching ant thigh/hip musculature with therapist providing PA pressure femoral head , 45 sec each side x 2   08/21/22:  Nustep level 5 x 6 min TRX deep squat x 15 TRX row increased ROM  from last time 2x10 TRX hip drop x 10  Counter push up x 10  Quadruped hip raises 2x10 Prone T (horizontal ABD) 2x10 Prone shoulder ext 2x10   08/17/22:  Nustep level 5 x 6 min Seated leg extension unilateral 5#, 10 x 3 each Seated knee flexion B 25#, 10 x 3 TRX strap,: B shoulder rows TRX: Deep squats TRX: Standing lateral trunk stretch with shoulders overhead Prone stretch hip flexion, prone knee bend, 45 sec holds, each side, therapist providing overpressure manually and stabilization  Added prone hip flexor stretch, with 10 reps of press ups, on each side    08/14/22 Bike L3x90min Lumbar AROM Standing I,T,Y back to wall on pool noodle x 10 each way Wall angels back to wall x 10 Supine SLR 2x10 bil Supine bridge with march x 10  Supine bridge with step out x 10 Bird dog 2x10   08/07/22 Bike L3x56min R hip ADD/ER stretch hooklying 2x30 sec R hip add stretch supine w/ strap 3x15 sec S/L hip abduction x 15 R/L Bridge with blue TB 2x15 S/L clamshells blue TB 2x10 Standing shld ext w/ cable  I, T, Y x 10 at doorframe STM to R hip adductors, and lateral patellar  ligament   07/31/22 Nustep L5 x 6 min  Open books x 10 bil  Windshield wipers x 10  On foam roller - back stroke x 10 for thoracic mobilization, snow angels x 10, pec stretch x 1 min Hip flexor stretch x 1 min bil  Double leg stretch on wall x 3 min for hamstrings.  Standing L -stretch at counter Standing low back stretch at counter x 10   07/27/22: Nustep level 5 x6 min Quadriped alt arm amd leg lifts Seated rows 45# Prone for foam roller IASTM thoracic region and gluteals Supine stretch over 1/2 foam roller with pecs stretch at 90 degrees horiz abd and with B shoulder flexion Seated for lat pull downs with 45# 2 sets 15 reps Standing hip abd with cable,5#, 10 x 2 sets Leg press, B hips, 25# 10 x 3 Prone props, added pillow under abdomen to allow for better ant hip and trunk relaxation, inst to wt shift side to side to improve thoracic mobility and scapular movement Prone for manual stretching by PT for B ant hip flexors,40 sec holds each side x 3 bouts each.  07/25/22 Therapeutic Exercise: to improve strength and mobility.  Demo, verbal and tactile cues throughout for technique.  Bike L3x63min Wall angels 2x10 with chin tucks Standing shoulder flexion at wall with chin tuck 2# weights x 10  S/L shoulder abduction 2# 2x10 Bil S/L shoulder ER 2x10 LUE; x 10 RUE Standing pallof press GTB x 10 bil Standing shoulder ext GTB 2x10  07/20/22 Therapeutic Exercise: to improve strength and mobility.  Demo, verbal and tactile cues throughout for technique.  Bike L3x14min Assess  lumbar AROM Wall angels 2x10 Lumbar extension standing at wall x 10  Wall push ups 2x10 Standing hip extension 2x10 at wall  Staggered stance wall slide into flexion x 10  Seated hamstring stretch 2x30 sec leg propped on mat table Prone on elbows with neck extension x 10  Prone quad stretch 2x30 sec w/ strap  Manual Therapy: to decrease muscle spasm, pain and improve mobility IASTM w/ foam roll to thoracolumbar  PS in prone   PATIENT EDUCATION:  Education details:  Person educated: Patient Education method: Programmer, multimedia, Facilities manager, and Handouts Education comprehension: verbalized understanding and returned demonstration  HOME EXERCISE PROGRAM: Access Code: [email or web address] URL: [email or web address] Date: 07/31/2022 Prepared by: Harrie Foreman  Exercises - Supine Lower Trunk Rotation  - 2 x daily - 7 x weekly - 1 sets - 5 reps - 10 sec hold - Cat Cow to Child's Pose  - 1 x daily - 7 x weekly - 1-3 sets - 10 reps - Seated Scapular Retraction  - 1 x daily - 7 x weekly - 1-3 sets - 10 reps - 2-3 sec hold - Doorway Pec Stretch at 90 Degrees Abduction  - 1 x daily - 7 x weekly - 1 sets - 3 reps - 30-60 seconds hold - Supine Bridge with Resistance Band  - 1 x daily - 7 x weekly - 3 sets - 10 reps - Clamshell with Resistance  - 1 x daily - 7 x weekly - 3 sets - 10 reps - Shoulder External Rotation and Scapular Retraction with Resistance  - 1 x daily - 7 x weekly - 3 sets - 10 reps - Prone on Elbows Stretch  - 1 x daily - 7 x weekly - 3 sets - 10 reps - Hip Extension with Resistance Loop  - 1 x daily - 7 x weekly - 3 sets - 10 reps - Scapular Stability on Wall With Pillowcase (Hemiplegia)  - 1 x daily - 7 x weekly - 3 sets - 10 reps - Supine Hamstring Stretch with Strap  - 1 x daily - 7 x weekly - 3 sets - 30 sec hold - Supine Hip Internal and External Rotation  - 1 x daily - 7 x weekly - 1 sets - 10 reps - Sidelying Open Book Thoracic Lumbar Rotation and Extension  - 1 x daily - 7 x weekly - 1 sets - 10 reps - Modified Thomas Stretch  - 1 x daily - 7 x weekly - 1 sets - 3 reps - 1 min  hold - Double Leg Hamstring Stretch at Wall  - 1 x daily - 7 x weekly - 1 sets - 1 reps - 5 min  hold - Standing 'L' Stretch at [employer]  - 3 x daily - 7 x weekly - 1 sets - 10 reps - Standing Lumbar Extension with Counter  - 3 x daily - 7 x weekly - 1 sets - 10 reps - Thoracic Foam Roll Mobilization  Backstroke  - 1 x daily - 7 x weekly - 2 sets - 10 reps - [employer] on Foam Roll  - 1 x daily - 7 x weekly - 2 sets - 10 reps - Supine Static Chest Stretch on Foam Roll  - 1 x daily - 7 x weekly - 1 sets - 1 reps - 1 min hold   ASSESSMENT:  CLINICAL IMPRESSION:   Pt is attending skilled PT to address chronic B shoulder pain and stiffness and LS spine pain/  stiffness. HE tolerates ex well, states that he intends to utilize his fitness equipment at the gym at his apartment once completed PT here in the next 2 weeks.  Continuing to educate hip to stretch and try to maintain flexibility B ant hips and shoulders as much as possible, to improve shoulder mechanics, LBP. Anthony Russell continues to demonstrate potential for improvement and would benefit from continued skilled therapy to address impairments.  He is to complete current plan of care in 2 weeks and agrees with upcoming DC  OBJECTIVE IMPAIRMENTS: decreased activity tolerance, decreased mobility, decreased ROM, decreased strength, increased muscle spasms, impaired flexibility, impaired UE functional use, postural dysfunction, and pain.   ACTIVITY LIMITATIONS: standing and locomotion level  PARTICIPATION LIMITATIONS:  walking for exercise  PERSONAL FACTORS: Fitness, Profession, Time since onset of injury/illness/exacerbation, and 1-2 comorbidities: DM, previous back surgery  are also affecting patient's functional outcome.   REHAB POTENTIAL: Excellent  CLINICAL DECISION MAKING: Evolving/moderate complexity  EVALUATION COMPLEXITY: Moderate   GOALS: Goals reviewed with patient? Yes  SHORT TERM GOALS: Target date: 07/03/2022   Patient will be independent with initial HEP.  Baseline:  Goal status: MET  2.  Patient will report ability to centralize radicular symptoms with therex.  Baseline:  Goal status: IN PROGRESS- 06/30/22  3.  Complete FOTO for back by 2nd visit Baseline:  Goal status: MET  LONG TERM GOALS: Target date:  07/31/2022 extended to 09/07/22  Patient will be independent with advanced/ongoing HEP to improve outcomes and carryover.  Baseline:  Goal status: IN PROGRESS 07/27/22  has a plethora of home exercises  2.  Patient will report 75% improvement in low back radicular pain to improve QOL.  Baseline:  Goal status: IN PROGRESS- 07/25/22 (70% improvement)  3.  Patient will demonstrate full/functional pain free lumbar ROM to perform ADLs.   Baseline:  Goal status: IN PROGRESS- 07/20/22  4.  Patient will report 2/50 on Modified Oswestry to demonstrate improved functional ability.  Baseline: 8 / 50 = 16.0 % Goal status: IN PROGRESS 07/27/22: na today  5.  Patient will tolerate 30 min of walking without increased pain. Baseline:  Goal status: IN PROGRESS- 08/14/22 pt reports being able to walk 25- 30 min no pain  6.  Patient to demonstrate ability to achieve and maintain good spinal alignment/posturing and body mechanics needed for daily activities. Baseline:  Goal status: IN PROGRESS 07/27/22: Improving  7.  Patient will report 75% improvement in bil shoulder pain with petting cats and reaching outward with ADLS. Baseline:  Goal status: IN PROGRESS- 07/25/22 (50% improvement)  8  Patient will report 17 on Quick Dash to demonstrate improved functional ability.  Baseline: 27.3 / 100 = 27.3 % Goal status: MET QuickDASH Score: 11.4 / 100 = 11.4 % 08/21/22  PLAN:  PT FREQUENCY: 2x/week  PT DURATION: 8 weeks  PLANNED INTERVENTIONS: Therapeutic exercises, Therapeutic activity, Neuromuscular re-education, Patient/Family education, Self Care, Joint mobilization, Aquatic Therapy, Dry Needling, Electrical stimulation, Spinal manipulation, Spinal mobilization, Cryotherapy, Moist heat, Taping, Traction, Ultrasound, Ionotophoresis 4mg /ml Dexamethasone, and Manual therapy.  PLAN FOR NEXT SESSION: extension exercises; ant hip stretch, gluteal strengthening, Postural strengthening and core stability   Jasean Ambrosia L  Naesha Buckalew, PT 08/24/2022, 3:49 PM

## 2022-08-29 ENCOUNTER — Ambulatory Visit: Payer: BC Managed Care – PPO

## 2022-08-29 DIAGNOSIS — M25511 Pain in right shoulder: Secondary | ICD-10-CM | POA: Diagnosis not present

## 2022-08-29 DIAGNOSIS — M25512 Pain in left shoulder: Secondary | ICD-10-CM | POA: Diagnosis not present

## 2022-08-29 DIAGNOSIS — M5416 Radiculopathy, lumbar region: Secondary | ICD-10-CM

## 2022-08-29 DIAGNOSIS — R293 Abnormal posture: Secondary | ICD-10-CM | POA: Diagnosis not present

## 2022-08-29 DIAGNOSIS — R252 Cramp and spasm: Secondary | ICD-10-CM

## 2022-08-29 NOTE — Therapy (Signed)
OUTPATIENT PHYSICAL THERAPY TREATMENT    Patient Name: NIVEK DUPUY MRN: [id number] DOB:April 27, 1980, 42 y.o., male Today's Date: 08/29/2022  END OF SESSION:  PT End of Session - 08/29/22 1459     Visit Number 18    Date for PT Re-Evaluation 09/07/22    Authorization Type BC[employer]    PT Start Time 1448    PT Stop Time 1530    PT Time Calculation (min) 42 min    Activity Tolerance Patient tolerated treatment well    Behavior During Therapy WFL for tasks assessed/performed                 Past Medical History:  Diagnosis Date   Allergy    Allergy, unspecified not elsewhere classified    rx w/ OTC antihistamines PRN   Anxiety    Asthma    Atypical chest pain    recent neg eval w/ normal cxr/ekg   Depression    Diverticulosis    DM (diabetes mellitus) (HCC)    Dyspepsia    on protonix 40mg /d   Esophagitis    GERD (gastroesophageal reflux disease)    on protonix 40mg /d   HTN (hypertension)    Hyperlipidemia    on diet alone   IBS (irritable bowel syndrome)    Lumbar back pain    s/p lumbar laminectomy 2007 by DrCabbell   Migraines    Overweight(278.02)    weight Jan10=246#.[name].he was 225# in 9/07...diet and exercise was discussed   Sleep apnea    not wearing c-pap currently   Past Surgical History:  Procedure Laterality Date   DENTAL SURGERY     LUMBAR LAMINECTOMY  01/30/2005   DrCabbell   TEAR DUCT PROBING     Patient Active Problem List   Diagnosis Date Noted   Lumbar radiculopathy 05/10/2022   Capsulitis of left shoulder 05/10/2022   Sinus tachycardia 12/28/2021   Impingement of knee joint, right 12/20/2021   Achilles tendinitis, right leg 12/20/2021   Rotator cuff tendinitis, right 11/04/2021   Encounter for screening for other metabolic disorders 04/07/2021   Dyspnea on exertion 04/07/2021   Migraines 02/23/2021   Lumbar back pain 02/23/2021   HTN (hypertension) 02/23/2021   Dyspepsia 02/23/2021   DM (diabetes mellitus) (HCC) 02/23/2021   Nasal  septal deviation 02/17/2021   Laryngitis 01/19/2021   Asymmetric tonsils 12/01/2020   Subacromial bursitis of right shoulder joint 05/17/2020   Hypersomnia 11/03/2015   Obesity 11/03/2015   Allergic rhinitis 04/15/2014   Asthma in adult 04/15/2014   GERD (gastroesophageal reflux disease) 04/15/2014   IBS (irritable bowel syndrome) 04/01/2013   Nausea alone 01/15/2013   Diarrhea 01/15/2013   Depression 10/15/2012   Lesion of eyebrow 02/09/2012   Gastroparesis 07/05/2011   Anxiety 07/05/2010   TESTOSTERONE DEFICIENCY 05/26/2008   Hyperlipidemia 02/06/2008   Facet arthropathy, lumbar 02/06/2008   Atypical chest pain 02/06/2008    PCP: Esperanza Richters, PA-C   REFERRING PROVIDER: Lenda Kelp, MD   REFERRING DIAG:  54.16 (ICD-10-CM) - Lumbar radiculopathy  M75.81 (ICD-10-CM) - Rotator cuff tendinitis, right  M77.8 (ICD-10-CM) - Capsulitis of left shoulder    Rationale for Evaluation and Treatment: Rehabilitation  THERAPY DIAG:  Radiculopathy, lumbar region  Acute pain of both shoulders  Cramp and spasm  Abnormal posture  ONSET DATE: R leg pain several years; Shoulders - months  SUBJECTIVE:  SUBJECTIVE STATEMENT: Pt has increased R knee pain when deep squatting or putting his pants on.  PERTINENT HISTORY:  DM, Lumbar laminectomy L 2007  PAIN:  Are you having pain? No 5/10 when squatting or putting on pants  PRECAUTIONS: None  WEIGHT BEARING RESTRICTIONS: No  FALLS:  Has patient fallen in last 6 months? No  LIVING ENVIRONMENT: Lives with: lives alone Lives in: House/apartment Stairs: Yes: External: 2 flights steps; can reach both Has following equipment at home: None  OCCUPATION: Sport and exercise psychologist, works at home; has sit down and stand up desk  PLOF:  Independent  PATIENT GOALS: get regular movement back in back, be able to exercise without pain and pet my cats  NEXT MD VISIT: 06/19/22  OBJECTIVE:   DIAGNOSTIC FINDINGS:  XR Left - negative XR Right - Mild glenohumeral degenerative changes without loss of joint space.  [place] Right - thickening of the coracohumeral ligament and the deltoid ligament   PATIENT SURVEYS:  Modified Oswestry 8 / 50 = 16.0 %  Quick Dash 27.3 / 100 = 27.3 % FOTO TBD  SCREENING FOR RED FLAGS: Neg  COGNITION: Overall cognitive status: Within functional limits for tasks assessed     SENSATION: WFL  MUSCLE LENGTH: Marked tightness in bil HS, quads, hip flexors, hip rotators and lumbar  POSTURE: rounded shoulders, forward head, and tight QL with depressed R scapula; left lateral shift  PALPATION: R gluteus medius and lumbar R>L  LUMBAR ROM:   AROM eval 07/20/22 08/14/22  Flexion Limited by HS to knees Right below knees Mid shin  Extension 75% limited 60% limit- pain 60% limit  Right lateral flexion 50% limited 40% limit 30% limit  Left lateral flexion 50% limited 40% limit 30% limit  Right rotation 25% limited 25% limit   Left rotation 25% limited 25% limit    (Blank rows = not tested)  LOWER EXTREMITY ROM:   marked flexibility deficits affecting full range of bil hip and knee joints  LOWER EXTREMITY MMT:  Grossly 5/5 in  B LE   LUMBAR SPECIAL TESTS:  Straight leg raise test: Positive and Slump test: Positive  SHOULDER SPECIAL TESTS: Impingement tests: Neer impingement test: negative and Hawkins/Kennedy impingement test: positive  R  Rotator cuff assessment: neg bil  UPPER EXTREMITY ROM: Functional bil shoulder ROM  Active ROM Right eval Left eval  Shoulder flexion    Shoulder extension    Shoulder abduction    Shoulder adduction    Shoulder extension    Shoulder internal rotation    Shoulder external rotation    Elbow flexion    Elbow extension    Wrist flexion    Wrist  extension    Wrist ulnar deviation    Wrist radial deviation    Wrist pronation    Wrist supination     (Blank rows = not tested)  UPPER EXTREMITY MMT: Grossly 5/5 bil except R  ER 4+5 with mild pain   TODAY'S TREATMENT:  DATE: 08/29/22 Treadmill 2.0 mph x 8 min Squat with chest press blue wt ball 2x10 Squat with trunk twist each side 2x10 Modified single leg squats x 10 bil with chair in front Kettle bell swing 2x10 10lb weight Pallof press 10# 2x10 bil Prone quad stretch 2x30 sec  Recumbent bike level 4 x 6 min,  TRX row 2 x 10 TRX hip drop 10 each side TRX deep squats, 15 x  TRX combo of triceps press and lat pull overs 10 x 2 B hamstring curls 35#, 10  Leg extension 5#, each leg, 10 x 2 sets  Prone for PA mobs mid thoracic spine and post ribs to tolerance Prone manual stretching ant thigh/hip musculature with therapist providing PA pressure femoral head , 45 sec each side x 2   08/21/22:  Nustep level 5 x 6 min TRX deep squat x 15 TRX row increased ROM  from last time 2x10 TRX hip drop x 10  Counter push up x 10  Quadruped hip raises 2x10 Prone T (horizontal ABD) 2x10 Prone shoulder ext 2x10   08/17/22:  Nustep level 5 x 6 min Seated leg extension unilateral 5#, 10 x 3 each Seated knee flexion B 25#, 10 x 3 TRX strap,: B shoulder rows TRX: Deep squats TRX: Standing lateral trunk stretch with shoulders overhead Prone stretch hip flexion, prone knee bend, 45 sec holds, each side, therapist providing overpressure manually and stabilization  Added prone hip flexor stretch, with 10 reps of press ups, on each side    08/14/22 Bike L3x31min Lumbar AROM Standing I,T,Y back to wall on pool noodle x 10 each way Wall angels back to wall x 10 Supine SLR 2x10 bil Supine bridge with march x 10  Supine bridge with step out x 10 Bird dog 2x10    08/07/22 Bike L3x69min R hip ADD/ER stretch hooklying 2x30 sec R hip add stretch supine w/ strap 3x15 sec S/L hip abduction x 15 R/L Bridge with blue TB 2x15 S/L clamshells blue TB 2x10 Standing shld ext w/ cable  I, T, Y x 10 at doorframe STM to R hip adductors, and lateral patellar ligament   07/31/22 Nustep L5 x 6 min  Open books x 10 bil  Windshield wipers x 10  On foam roller - back stroke x 10 for thoracic mobilization, snow angels x 10, pec stretch x 1 min Hip flexor stretch x 1 min bil  Double leg stretch on wall x 3 min for hamstrings.  Standing L -stretch at counter Standing low back stretch at counter x 10   07/27/22: Nustep level 5 x6 min Quadriped alt arm amd leg lifts Seated rows 45# Prone for foam roller IASTM thoracic region and gluteals Supine stretch over 1/2 foam roller with pecs stretch at 90 degrees horiz abd and with B shoulder flexion Seated for lat pull downs with 45# 2 sets 15 reps Standing hip abd with cable,5#, 10 x 2 sets Leg press, B hips, 25# 10 x 3 Prone props, added pillow under abdomen to allow for better ant hip and trunk relaxation, inst to wt shift side to side to improve thoracic mobility and scapular movement Prone for manual stretching by PT for B ant hip flexors,40 sec holds each side x 3 bouts each.  07/25/22 Therapeutic Exercise: to improve strength and mobility.  Demo, verbal and tactile cues throughout for technique.  Bike L3x43min Wall angels 2x10 with chin tucks Standing shoulder flexion at wall with chin tuck 2# weights x  10  S/L shoulder abduction 2# 2x10 Bil S/L shoulder ER 2x10 LUE; x 10 RUE Standing pallof press GTB x 10 bil Standing shoulder ext GTB 2x10  07/20/22 Therapeutic Exercise: to improve strength and mobility.  Demo, verbal and tactile cues throughout for technique.  Bike L3x49min Assess lumbar AROM Wall angels 2x10 Lumbar extension standing at wall x 10  Wall push ups 2x10 Standing hip extension 2x10 at wall   Staggered stance wall slide into flexion x 10  Seated hamstring stretch 2x30 sec leg propped on mat table Prone on elbows with neck extension x 10  Prone quad stretch 2x30 sec w/ strap  Manual Therapy: to decrease muscle spasm, pain and improve mobility IASTM w/ foam roll to thoracolumbar PS in prone   PATIENT EDUCATION:  Education details:  Person educated: Patient Education method: Programmer, multimedia, Facilities manager, and Handouts Education comprehension: verbalized understanding and returned demonstration  HOME EXERCISE PROGRAM: Access Code: [email or web address] URL: [email or web address] Date: 07/31/2022 Prepared by: Harrie Foreman  Exercises - Supine Lower Trunk Rotation  - 2 x daily - 7 x weekly - 1 sets - 5 reps - 10 sec hold - Cat Cow to Child's Pose  - 1 x daily - 7 x weekly - 1-3 sets - 10 reps - Seated Scapular Retraction  - 1 x daily - 7 x weekly - 1-3 sets - 10 reps - 2-3 sec hold - Doorway Pec Stretch at 90 Degrees Abduction  - 1 x daily - 7 x weekly - 1 sets - 3 reps - 30-60 seconds hold - Supine Bridge with Resistance Band  - 1 x daily - 7 x weekly - 3 sets - 10 reps - Clamshell with Resistance  - 1 x daily - 7 x weekly - 3 sets - 10 reps - Shoulder External Rotation and Scapular Retraction with Resistance  - 1 x daily - 7 x weekly - 3 sets - 10 reps - Prone on Elbows Stretch  - 1 x daily - 7 x weekly - 3 sets - 10 reps - Hip Extension with Resistance Loop  - 1 x daily - 7 x weekly - 3 sets - 10 reps - Scapular Stability on Wall With Pillowcase (Hemiplegia)  - 1 x daily - 7 x weekly - 3 sets - 10 reps - Supine Hamstring Stretch with Strap  - 1 x daily - 7 x weekly - 3 sets - 30 sec hold - Supine Hip Internal and External Rotation  - 1 x daily - 7 x weekly - 1 sets - 10 reps - Sidelying Open Book Thoracic Lumbar Rotation and Extension  - 1 x daily - 7 x weekly - 1 sets - 10 reps - Modified Thomas Stretch  - 1 x daily - 7 x weekly - 1 sets - 3 reps - 1 min  hold -  Double Leg Hamstring Stretch at Wall  - 1 x daily - 7 x weekly - 1 sets - 1 reps - 5 min  hold - Standing 'L' Stretch at [employer]  - 3 x daily - 7 x weekly - 1 sets - 10 reps - Standing Lumbar Extension with Counter  - 3 x daily - 7 x weekly - 1 sets - 10 reps - Thoracic Foam Roll Mobilization Backstroke  - 1 x daily - 7 x weekly - 2 sets - 10 reps - [employer] on Foam Roll  - 1 x daily - 7 x weekly - 2 sets - 10 reps - Supine  Static Chest Stretch on Foam Roll  - 1 x daily - 7 x weekly - 1 sets - 1 reps - 1 min hold   ASSESSMENT:  CLINICAL IMPRESSION:   Pt continues to show good tolerance for exercises. We focused on more on loading of the R knee with squats. He met his goal for the modified oswestry today. He was able to complete all interventions w/o increased pain. He mostly has R knee pain with deep knee flexion and needs to keep working on CKC quad strengthening.  OBJECTIVE IMPAIRMENTS: decreased activity tolerance, decreased mobility, decreased ROM, decreased strength, increased muscle spasms, impaired flexibility, impaired UE functional use, postural dysfunction, and pain.   ACTIVITY LIMITATIONS: standing and locomotion level  PARTICIPATION LIMITATIONS:  walking for exercise  PERSONAL FACTORS: Fitness, Profession, Time since onset of injury/illness/exacerbation, and 1-2 comorbidities: DM, previous back surgery  are also affecting patient's functional outcome.   REHAB POTENTIAL: Excellent  CLINICAL DECISION MAKING: Evolving/moderate complexity  EVALUATION COMPLEXITY: Moderate   GOALS: Goals reviewed with patient? Yes  SHORT TERM GOALS: Target date: 07/03/2022   Patient will be independent with initial HEP.  Baseline:  Goal status: MET  2.  Patient will report ability to centralize radicular symptoms with therex.  Baseline:  Goal status: IN PROGRESS- 06/30/22  3.  Complete FOTO for back by 2nd visit Baseline:  Goal status: MET  LONG TERM GOALS: Target date: 07/31/2022  extended to 09/07/22  Patient will be independent with advanced/ongoing HEP to improve outcomes and carryover.  Baseline:  Goal status: IN PROGRESS 07/27/22  has a plethora of home exercises  2.  Patient will report 75% improvement in low back radicular pain to improve QOL.  Baseline:  Goal status: IN PROGRESS- 07/25/22 (70% improvement)  3.  Patient will demonstrate full/functional pain free lumbar ROM to perform ADLs.   Baseline:  Goal status: IN PROGRESS- 07/20/22  4.  Patient will report 2/50 on Modified Oswestry to demonstrate improved functional ability.  Baseline: 8 / 50 = 16.0 % Goal status: MET 2 / 50 = 4.0 % 08/29/22  5.  Patient will tolerate 30 min of walking without increased pain. Baseline:  Goal status: IN PROGRESS- 08/14/22 pt reports being able to walk 25- 30 min no pain  6.  Patient to demonstrate ability to achieve and maintain good spinal alignment/posturing and body mechanics needed for daily activities. Baseline:  Goal status: IN PROGRESS 07/27/22: Improving  7.  Patient will report 75% improvement in bil shoulder pain with petting cats and reaching outward with ADLS. Baseline:  Goal status: IN PROGRESS- 07/25/22 (50% improvement)  8  Patient will report 17 on Quick Dash to demonstrate improved functional ability.  Baseline: 27.3 / 100 = 27.3 % Goal status: MET QuickDASH Score: 11.4 / 100 = 11.4 % 08/21/22  PLAN:  PT FREQUENCY: 2x/week  PT DURATION: 8 weeks  PLANNED INTERVENTIONS: Therapeutic exercises, Therapeutic activity, Neuromuscular re-education, Patient/Family education, Self Care, Joint mobilization, Aquatic Therapy, Dry Needling, Electrical stimulation, Spinal manipulation, Spinal mobilization, Cryotherapy, Moist heat, Taping, Traction, Ultrasound, Ionotophoresis 4mg /ml Dexamethasone, and Manual therapy.  PLAN FOR NEXT SESSION: extension exercises; ant hip stretch, gluteal strengthening, Postural strengthening and core stability   Darleene Cleaver,  PTA 08/29/2022, 4:00 PM

## 2022-08-31 ENCOUNTER — Other Ambulatory Visit: Payer: Self-pay

## 2022-08-31 ENCOUNTER — Ambulatory Visit: Payer: BC Managed Care – PPO | Attending: Family Medicine

## 2022-08-31 DIAGNOSIS — M5416 Radiculopathy, lumbar region: Secondary | ICD-10-CM | POA: Insufficient documentation

## 2022-08-31 DIAGNOSIS — M25512 Pain in left shoulder: Secondary | ICD-10-CM | POA: Insufficient documentation

## 2022-08-31 DIAGNOSIS — M25511 Pain in right shoulder: Secondary | ICD-10-CM | POA: Insufficient documentation

## 2022-08-31 DIAGNOSIS — R252 Cramp and spasm: Secondary | ICD-10-CM | POA: Diagnosis not present

## 2022-08-31 DIAGNOSIS — R293 Abnormal posture: Secondary | ICD-10-CM | POA: Insufficient documentation

## 2022-08-31 NOTE — Therapy (Signed)
OUTPATIENT PHYSICAL THERAPY TREATMENT    Patient Name: Anthony Russell MRN: [id number] DOB:[birth year], 42 y.o., male Today's Date: 08/31/2022  END OF SESSION:  PT End of Session - 08/31/22 1502     Visit Number 19    Date for PT Re-Evaluation 09/07/22    PT Start Time 1447    PT Stop Time 1530    PT Time Calculation (min) 43 min    Activity Tolerance Patient tolerated treatment well    Behavior During Therapy WFL for tasks assessed/performed                  Past Medical History:  Diagnosis Date   Allergy    Allergy, unspecified not elsewhere classified    rx w/ OTC antihistamines PRN   Anxiety    Asthma    Atypical chest pain    recent neg eval w/ normal cxr/ekg   Depression    Diverticulosis    DM (diabetes mellitus) (HCC)    Dyspepsia    on protonix 40mg /d   Esophagitis    GERD (gastroesophageal reflux disease)    on protonix 40mg /d   HTN (hypertension)    Hyperlipidemia    on diet alone   IBS (irritable bowel syndrome)    Lumbar back pain    s/p lumbar laminectomy 2007 by DrCabbell   Migraines    Overweight(278.02)    weight Jan10=246#.[name].he was 225# in 9/07...diet and exercise was discussed   Sleep apnea    not wearing c-pap currently   Past Surgical History:  Procedure Laterality Date   DENTAL SURGERY     LUMBAR LAMINECTOMY  01/30/2005   DrCabbell   TEAR DUCT PROBING     Patient Active Problem List   Diagnosis Date Noted   Lumbar radiculopathy 05/10/2022   Capsulitis of left shoulder 05/10/2022   Sinus tachycardia 12/28/2021   Impingement of knee joint, right 12/20/2021   Achilles tendinitis, right leg 12/20/2021   Rotator cuff tendinitis, right 11/04/2021   Encounter for screening for other metabolic disorders 04/07/2021   Dyspnea on exertion 04/07/2021   Migraines 02/23/2021   Lumbar back pain 02/23/2021   HTN (hypertension) 02/23/2021   Dyspepsia 02/23/2021   DM (diabetes mellitus) (HCC) 02/23/2021   Nasal septal deviation  02/17/2021   Laryngitis 01/19/2021   Asymmetric tonsils 12/01/2020   Subacromial bursitis of right shoulder joint 05/17/2020   Hypersomnia 11/03/2015   Obesity 11/03/2015   Allergic rhinitis 04/15/2014   Asthma in adult 04/15/2014   GERD (gastroesophageal reflux disease) 04/15/2014   IBS (irritable bowel syndrome) 04/01/2013   Nausea alone 01/15/2013   Diarrhea 01/15/2013   Depression 10/15/2012   Lesion of eyebrow 02/09/2012   Gastroparesis 07/05/2011   Anxiety 07/05/2010   TESTOSTERONE DEFICIENCY 05/26/2008   Hyperlipidemia 02/06/2008   Facet arthropathy, lumbar 02/06/2008   Atypical chest pain 02/06/2008    PCP: Esperanza Richters, PA-C   REFERRING PROVIDER: Lenda Kelp, MD   REFERRING DIAG:  54.16 (ICD-10-CM) - Lumbar radiculopathy  M75.81 (ICD-10-CM) - Rotator cuff tendinitis, right  M77.8 (ICD-10-CM) - Capsulitis of left shoulder    Rationale for Evaluation and Treatment: Rehabilitation  THERAPY DIAG:  Radiculopathy, lumbar region  Acute pain of both shoulders  Abnormal posture  Cramp and spasm  ONSET DATE: R leg pain several years; Shoulders - months  SUBJECTIVE:  SUBJECTIVE STATEMENT: Pt has increased R knee pain when deep squatting or putting his pants on.  PERTINENT HISTORY:  DM, Lumbar laminectomy L 2007  PAIN:  Are you having pain? No 5/10 when squatting or putting on pants  PRECAUTIONS: None  WEIGHT BEARING RESTRICTIONS: No  FALLS:  Has patient fallen in last 6 months? No  LIVING ENVIRONMENT: Lives with: lives alone Lives in: House/apartment Stairs: Yes: External: 2 flights steps; can reach both Has following equipment at home: None  OCCUPATION: Sport and exercise psychologist, works at home; has sit down and stand up desk  PLOF: Independent  PATIENT GOALS:  get regular movement back in back, be able to exercise without pain and pet my cats  NEXT MD VISIT: 06/19/22  OBJECTIVE:   DIAGNOSTIC FINDINGS:  XR Left - negative XR Right - Mild glenohumeral degenerative changes without loss of joint space.  [place] Right - thickening of the coracohumeral ligament and the deltoid ligament   PATIENT SURVEYS:  Modified Oswestry 8 / 50 = 16.0 %  Quick Dash 27.3 / 100 = 27.3 % FOTO TBD  SCREENING FOR RED FLAGS: Neg  COGNITION: Overall cognitive status: Within functional limits for tasks assessed     SENSATION: WFL  MUSCLE LENGTH: Marked tightness in bil HS, quads, hip flexors, hip rotators and lumbar  POSTURE: rounded shoulders, forward head, and tight QL with depressed R scapula; left lateral shift  PALPATION: R gluteus medius and lumbar R>L  LUMBAR ROM:   AROM eval 07/20/22 08/14/22  Flexion Limited by HS to knees Right below knees Mid shin  Extension 75% limited 60% limit- pain 60% limit  Right lateral flexion 50% limited 40% limit 30% limit  Left lateral flexion 50% limited 40% limit 30% limit  Right rotation 25% limited 25% limit   Left rotation 25% limited 25% limit    (Blank rows = not tested)  LOWER EXTREMITY ROM:   marked flexibility deficits affecting full range of bil hip and knee joints  LOWER EXTREMITY MMT:  Grossly 5/5 in  B LE   LUMBAR SPECIAL TESTS:  Straight leg raise test: Positive and Slump test: Positive  SHOULDER SPECIAL TESTS: Impingement tests: Neer impingement test: negative and Hawkins/Kennedy impingement test: positive  R  Rotator cuff assessment: neg bil  UPPER EXTREMITY ROM: Functional bil shoulder ROM  Active ROM Right eval Left eval  Shoulder flexion    Shoulder extension    Shoulder abduction    Shoulder adduction    Shoulder extension    Shoulder internal rotation    Shoulder external rotation    Elbow flexion    Elbow extension    Wrist flexion    Wrist extension    Wrist ulnar deviation     Wrist radial deviation    Wrist pronation    Wrist supination     (Blank rows = not tested)  UPPER EXTREMITY MMT: Grossly 5/5 bil except R  ER 4+5 with mild pain   TODAY'S TREATMENT:  DATE: 08/31/22:  Recumbent  TRX: BATCA seated shoulder press 15# hands in neutral position 2 x 10 BATCA pec flys 15 # 2 x 10 Kettle bell swing 10# 2 sets of 10 Prone manual stretch quads B with PA pressure femoral head 60 sec holds 3 sets B   08/29/22 Treadmill 2.0 mph x 8 min Squat with chest press blue wt ball 2x10 Squat with trunk twist each side 2x10 Modified single leg squats x 10 bil with chair in front Kettle bell swing 2x10 10lb weight Pallof press 10# 2x10 bil Prone quad stretch 2x30 sec  7/ Recumbent bike level 4 x 6 min,  TRX row 2 x 10 TRX hip drop 10 each side TRX deep squats, 15 x  TRX combo of triceps press and lat pull overs 10 x 2 B hamstring curls 35#, 10  Leg extension 5#, each leg, 10 x 2 sets  Prone for PA mobs mid thoracic spine and post ribs to tolerance Prone manual stretching ant thigh/hip musculature with therapist providing PA pressure femoral head , 45 sec each side x 2   08/21/22:  Nustep level 5 x 6 min TRX deep squat x 15 TRX row increased ROM  from last time 2x10 TRX hip drop x 10  Counter push up x 10  Quadruped hip raises 2x10 Prone T (horizontal ABD) 2x10 Prone shoulder ext 2x10   08/17/22:  Nustep level 5 x 6 min Seated leg extension unilateral 5#, 10 x 3 each Seated knee flexion B 25#, 10 x 3 TRX strap,: B shoulder rows TRX: Deep squats TRX: Standing lateral trunk stretch with shoulders overhead Prone stretch hip flexion, prone knee bend, 45 sec holds, each side, therapist providing overpressure manually and stabilization  Added prone hip flexor stretch, with 10 reps of press ups, on each side    08/14/22 Bike  L3x18min Lumbar AROM Standing I,T,Y back to wall on pool noodle x 10 each way Wall angels back to wall x 10 Supine SLR 2x10 bil Supine bridge with march x 10  Supine bridge with step out x 10 Bird dog 2x10   08/07/22 Bike L3x11min R hip ADD/ER stretch hooklying 2x30 sec R hip add stretch supine w/ strap 3x15 sec S/L hip abduction x 15 R/L Bridge with blue TB 2x15 S/L clamshells blue TB 2x10 Standing shld ext w/ cable  I, T, Y x 10 at doorframe STM to R hip adductors, and lateral patellar ligament   07/31/22 Nustep L5 x 6 min  Open books x 10 bil  Windshield wipers x 10  On foam roller - back stroke x 10 for thoracic mobilization, snow angels x 10, pec stretch x 1 min Hip flexor stretch x 1 min bil  Double leg stretch on wall x 3 min for hamstrings.  Standing L -stretch at counter Standing low back stretch at counter x 10   07/27/22: Nustep level 5 x6 min Quadriped alt arm amd leg lifts Seated rows 45# Prone for foam roller IASTM thoracic region and gluteals Supine stretch over 1/2 foam roller with pecs stretch at 90 degrees horiz abd and with B shoulder flexion Seated for lat pull downs with 45# 2 sets 15 reps Standing hip abd with cable,5#, 10 x 2 sets Leg press, B hips, 25# 10 x 3 Prone props, added pillow under abdomen to allow for better ant hip and trunk relaxation, inst to wt shift side to side to improve thoracic mobility and scapular movement Prone for manual stretching by  PT for B ant hip flexors,40 sec holds each side x 3 bouts each.  07/25/22 Therapeutic Exercise: to improve strength and mobility.  Demo, verbal and tactile cues throughout for technique.  Bike L3x76min Wall angels 2x10 with chin tucks Standing shoulder flexion at wall with chin tuck 2# weights x 10  S/L shoulder abduction 2# 2x10 Bil S/L shoulder ER 2x10 LUE; x 10 RUE Standing pallof press GTB x 10 bil Standing shoulder ext GTB 2x10  07/20/22 Therapeutic Exercise: to improve strength and  mobility.  Demo, verbal and tactile cues throughout for technique.  Bike L3x35min Assess lumbar AROM Wall angels 2x10 Lumbar extension standing at wall x 10  Wall push ups 2x10 Standing hip extension 2x10 at wall  Staggered stance wall slide into flexion x 10  Seated hamstring stretch 2x30 sec leg propped on mat table Prone on elbows with neck extension x 10  Prone quad stretch 2x30 sec w/ strap  Manual Therapy: to decrease muscle spasm, pain and improve mobility IASTM w/ foam roll to thoracolumbar PS in prone   PATIENT EDUCATION:  Education details:  Person educated: Patient Education method: Programmer, multimedia, Facilities manager, and Handouts Education comprehension: verbalized understanding and returned demonstration  HOME EXERCISE PROGRAM: Access Code: [email or web address] URL: [email or web address] Date: 07/31/2022 Prepared by: Harrie Foreman  Exercises - Supine Lower Trunk Rotation  - 2 x daily - 7 x weekly - 1 sets - 5 reps - 10 sec hold - Cat Cow to Child's Pose  - 1 x daily - 7 x weekly - 1-3 sets - 10 reps - Seated Scapular Retraction  - 1 x daily - 7 x weekly - 1-3 sets - 10 reps - 2-3 sec hold - Doorway Pec Stretch at 90 Degrees Abduction  - 1 x daily - 7 x weekly - 1 sets - 3 reps - 30-60 seconds hold - Supine Bridge with Resistance Band  - 1 x daily - 7 x weekly - 3 sets - 10 reps - Clamshell with Resistance  - 1 x daily - 7 x weekly - 3 sets - 10 reps - Shoulder External Rotation and Scapular Retraction with Resistance  - 1 x daily - 7 x weekly - 3 sets - 10 reps - Prone on Elbows Stretch  - 1 x daily - 7 x weekly - 3 sets - 10 reps - Hip Extension with Resistance Loop  - 1 x daily - 7 x weekly - 3 sets - 10 reps - Scapular Stability on Wall With Pillowcase (Hemiplegia)  - 1 x daily - 7 x weekly - 3 sets - 10 reps - Supine Hamstring Stretch with Strap  - 1 x daily - 7 x weekly - 3 sets - 30 sec hold - Supine Hip Internal and External Rotation  - 1 x daily - 7 x weekly  - 1 sets - 10 reps - Sidelying Open Book Thoracic Lumbar Rotation and Extension  - 1 x daily - 7 x weekly - 1 sets - 10 reps - Modified Thomas Stretch  - 1 x daily - 7 x weekly - 1 sets - 3 reps - 1 min  hold - Double Leg Hamstring Stretch at Wall  - 1 x daily - 7 x weekly - 1 sets - 1 reps - 5 min  hold - Standing 'L' Stretch at Counter  - 3 x daily - 7 x weekly - 1 sets - 10 reps - Standing Lumbar Extension with Counter  - 3 x daily - 7  x weekly - 1 sets - 10 reps - Thoracic Foam Roll Mobilization Backstroke  - 1 x daily - 7 x weekly - 2 sets - 10 reps - Snow Angels on Foam Roll  - 1 x daily - 7 x weekly - 2 sets - 10 reps - Supine Static Chest Stretch on Foam Roll  - 1 x daily - 7 x weekly - 1 sets - 1 reps - 1 min hold   ASSESSMENT:  CLINICAL IMPRESSION:   Pt continues to show good tolerance for exercises. He is really stiff through B shoulders and trunk.  Education through today's session and previous sessions to maintain some level of fitness once he has completed PT in this clinic .  He is very stiff, rigid through entire spine and trunk. He mostly has R knee pain with deep knee flexion and needs to keep working on CKC quad strengthening.  OBJECTIVE IMPAIRMENTS: decreased activity tolerance, decreased mobility, decreased ROM, decreased strength, increased muscle spasms, impaired flexibility, impaired UE functional use, postural dysfunction, and pain.   ACTIVITY LIMITATIONS: standing and locomotion level  PARTICIPATION LIMITATIONS:  walking for exercise  PERSONAL FACTORS: Fitness, Profession, Time since onset of injury/illness/exacerbation, and 1-2 comorbidities: DM, previous back surgery  are also affecting patient's functional outcome.   REHAB POTENTIAL: Excellent  CLINICAL DECISION MAKING: Evolving/moderate complexity  EVALUATION COMPLEXITY: Moderate   GOALS: Goals reviewed with patient? Yes  SHORT TERM GOALS: Target date: 07/03/2022   Patient will be independent with  initial HEP.  Baseline:  Goal status: MET  2.  Patient will report ability to centralize radicular symptoms with therex.  Baseline:  Goal status: IN PROGRESS- 06/30/22  3.  Complete FOTO for back by 2nd visit Baseline:  Goal status: MET  LONG TERM GOALS: Target date: 07/31/2022 extended to 09/07/22  Patient will be independent with advanced/ongoing HEP to improve outcomes and carryover.  Baseline:  Goal status: IN PROGRESS 07/27/22  has a plethora of home exercises  2.  Patient will report 75% improvement in low back radicular pain to improve QOL.  Baseline:  Goal status: IN PROGRESS- 07/25/22 (70% improvement)  3.  Patient will demonstrate full/functional pain free lumbar ROM to perform ADLs.   Baseline:  Goal status: IN PROGRESS- 07/20/22  4.  Patient will report 2/50 on Modified Oswestry to demonstrate improved functional ability.  Baseline: 8 / 50 = 16.0 % Goal status: MET 2 / 50 = 4.0 % 08/29/22  5.  Patient will tolerate 30 min of walking without increased pain. Baseline:  Goal status: IN PROGRESS- 08/14/22 pt reports being able to walk 25- 30 min no pain  6.  Patient to demonstrate ability to achieve and maintain good spinal alignment/posturing and body mechanics needed for daily activities. Baseline:  Goal status: IN PROGRESS 07/27/22: Improving  7.  Patient will report 75% improvement in bil shoulder pain with petting cats and reaching outward with ADLS. Baseline:  Goal status: IN PROGRESS- 07/25/22 (50% improvement)  8  Patient will report 17 on Quick Dash to demonstrate improved functional ability.  Baseline: 27.3 / 100 = 27.3 % Goal status: MET QuickDASH Score: 11.4 / 100 = 11.4 % 08/21/22  PLAN:  PT FREQUENCY: 2x/week  PT DURATION: 8 weeks  PLANNED INTERVENTIONS: Therapeutic exercises, Therapeutic activity, Neuromuscular re-education, Patient/Family education, Self Care, Joint mobilization, Aquatic Therapy, Dry Needling, Electrical stimulation, Spinal manipulation,  Spinal mobilization, Cryotherapy, Moist heat, Taping, Traction, Ultrasound, Ionotophoresis 4mg /ml Dexamethasone, and Manual therapy.  PLAN FOR NEXT SESSION: extension  exercises; ant hip stretch, gluteal strengthening, Postural strengthening and core stability   Byran Bilotti L Merridith Dershem, PTDPT OCS 08/31/2022, 3:39 PM

## 2022-09-04 ENCOUNTER — Ambulatory Visit: Payer: BC Managed Care – PPO

## 2022-09-04 DIAGNOSIS — R293 Abnormal posture: Secondary | ICD-10-CM

## 2022-09-04 DIAGNOSIS — R252 Cramp and spasm: Secondary | ICD-10-CM | POA: Diagnosis not present

## 2022-09-04 DIAGNOSIS — M25512 Pain in left shoulder: Secondary | ICD-10-CM

## 2022-09-04 DIAGNOSIS — M25511 Pain in right shoulder: Secondary | ICD-10-CM | POA: Diagnosis not present

## 2022-09-04 DIAGNOSIS — M5416 Radiculopathy, lumbar region: Secondary | ICD-10-CM | POA: Diagnosis not present

## 2022-09-04 NOTE — Therapy (Signed)
OUTPATIENT PHYSICAL THERAPY TREATMENT    Patient Name: Anthony Russell MRN: [id number] DOB:Sep 29, 1980, 42 y.o., male Today's Date: 09/04/2022  END OF SESSION:  PT End of Session - 09/04/22 1507     Visit Number 20    Date for PT Re-Evaluation 09/07/22    Authorization Type BC[employer]    PT Start Time 1446    PT Stop Time 1530    PT Time Calculation (min) 44 min    Activity Tolerance Patient tolerated treatment well    Behavior During Therapy WFL for tasks assessed/performed                   Past Medical History:  Diagnosis Date   Allergy    Allergy, unspecified not elsewhere classified    rx w/ OTC antihistamines PRN   Anxiety    Asthma    Atypical chest pain    recent neg eval w/ normal cxr/ekg   Depression    Diverticulosis    DM (diabetes mellitus) (HCC)    Dyspepsia    on protonix 40mg /d   Esophagitis    GERD (gastroesophageal reflux disease)    on protonix 40mg /d   HTN (hypertension)    Hyperlipidemia    on diet alone   IBS (irritable bowel syndrome)    Lumbar back pain    s/p lumbar laminectomy 2007 by DrCabbell   Migraines    Overweight(278.02)    weight Jan10=246#.[name].he was 225# in 9/07...diet and exercise was discussed   Sleep apnea    not wearing c-pap currently   Past Surgical History:  Procedure Laterality Date   DENTAL SURGERY     LUMBAR LAMINECTOMY  01/30/2005   DrCabbell   TEAR DUCT PROBING     Patient Active Problem List   Diagnosis Date Noted   Lumbar radiculopathy 05/10/2022   Capsulitis of left shoulder 05/10/2022   Sinus tachycardia 12/28/2021   Impingement of knee joint, right 12/20/2021   Achilles tendinitis, right leg 12/20/2021   Rotator cuff tendinitis, right 11/04/2021   Encounter for screening for other metabolic disorders 04/07/2021   Dyspnea on exertion 04/07/2021   Migraines 02/23/2021   Lumbar back pain 02/23/2021   HTN (hypertension) 02/23/2021   Dyspepsia 02/23/2021   DM (diabetes mellitus) (HCC) 02/23/2021    Nasal septal deviation 02/17/2021   Laryngitis 01/19/2021   Asymmetric tonsils 12/01/2020   Subacromial bursitis of right shoulder joint 05/17/2020   Hypersomnia 11/03/2015   Obesity 11/03/2015   Allergic rhinitis 04/15/2014   Asthma in adult 04/15/2014   GERD (gastroesophageal reflux disease) 04/15/2014   IBS (irritable bowel syndrome) 04/01/2013   Nausea alone 01/15/2013   Diarrhea 01/15/2013   Depression 10/15/2012   Lesion of eyebrow 02/09/2012   Gastroparesis 07/05/2011   Anxiety 07/05/2010   TESTOSTERONE DEFICIENCY 05/26/2008   Hyperlipidemia 02/06/2008   Facet arthropathy, lumbar 02/06/2008   Atypical chest pain 02/06/2008    PCP: Esperanza Richters, PA-C   REFERRING PROVIDER: Lenda Kelp, MD   REFERRING DIAG:  54.16 (ICD-10-CM) - Lumbar radiculopathy  M75.81 (ICD-10-CM) - Rotator cuff tendinitis, right  M77.8 (ICD-10-CM) - Capsulitis of left shoulder    Rationale for Evaluation and Treatment: Rehabilitation  THERAPY DIAG:  Radiculopathy, lumbar region  Acute pain of both shoulders  Abnormal posture  Cramp and spasm  ONSET DATE: R leg pain several years; Shoulders - months  SUBJECTIVE:  SUBJECTIVE STATEMENT: "Improved R knee pain with donning/doffing."  PERTINENT HISTORY:  DM, Lumbar laminectomy L 2007  PAIN:  Are you having pain? No 5/10 when squatting or putting on pants  PRECAUTIONS: None  WEIGHT BEARING RESTRICTIONS: No  FALLS:  Has patient fallen in last 6 months? No  LIVING ENVIRONMENT: Lives with: lives alone Lives in: House/apartment Stairs: Yes: External: 2 flights steps; can reach both Has following equipment at home: None  OCCUPATION: Sport and exercise psychologist, works at home; has sit down and stand up desk  PLOF: Independent  PATIENT GOALS: get  regular movement back in back, be able to exercise without pain and pet my cats  NEXT MD VISIT: 06/19/22  OBJECTIVE:   DIAGNOSTIC FINDINGS:  XR Left - negative XR Right - Mild glenohumeral degenerative changes without loss of joint space.  [place] Right - thickening of the coracohumeral ligament and the deltoid ligament   PATIENT SURVEYS:  Modified Oswestry 8 / 50 = 16.0 %  Quick Dash 27.3 / 100 = 27.3 % FOTO TBD  SCREENING FOR RED FLAGS: Neg  COGNITION: Overall cognitive status: Within functional limits for tasks assessed     SENSATION: WFL  MUSCLE LENGTH: Marked tightness in bil HS, quads, hip flexors, hip rotators and lumbar  POSTURE: rounded shoulders, forward head, and tight QL with depressed R scapula; left lateral shift  PALPATION: R gluteus medius and lumbar R>L  LUMBAR ROM:   AROM eval 07/20/22 08/14/22  Flexion Limited by HS to knees Right below knees Mid shin  Extension 75% limited 60% limit- pain 60% limit  Right lateral flexion 50% limited 40% limit 30% limit  Left lateral flexion 50% limited 40% limit 30% limit  Right rotation 25% limited 25% limit   Left rotation 25% limited 25% limit    (Blank rows = not tested)  LOWER EXTREMITY ROM:   marked flexibility deficits affecting full range of bil hip and knee joints  LOWER EXTREMITY MMT:  Grossly 5/5 in  B LE   LUMBAR SPECIAL TESTS:  Straight leg raise test: Positive and Slump test: Positive  SHOULDER SPECIAL TESTS: Impingement tests: Neer impingement test: negative and Hawkins/Kennedy impingement test: positive  R  Rotator cuff assessment: neg bil  UPPER EXTREMITY ROM: Functional bil shoulder ROM  Active ROM Right eval Left eval  Shoulder flexion    Shoulder extension    Shoulder abduction    Shoulder adduction    Shoulder extension    Shoulder internal rotation    Shoulder external rotation    Elbow flexion    Elbow extension    Wrist flexion    Wrist extension    Wrist ulnar deviation     Wrist radial deviation    Wrist pronation    Wrist supination     (Blank rows = not tested)  UPPER EXTREMITY MMT: Grossly 5/5 bil except R  ER 4+5 with mild pain   TODAY'S TREATMENT:  DATE: 09/04/22 Bike L3x71min TRX chest press 2 x 10  TRX biceps 2 x 10 Knee extension machine 10# 2x10 unilateral R/L Fwd step downs 4' step x 10 with LLE for R eccentric strengthening Hip hike from 4' step 2x10- second set added black TB for more quad activation Trunk rotation x 10 5# both ways  08/31/22:  Recumbent  TRX: BATCA seated shoulder press 15# hands in neutral position 2 x 10 BATCA pec flys 15 # 2 x 10 Kettle bell swing 10# 2 sets of 10 Prone manual stretch quads B with PA pressure femoral head 60 sec holds 3 sets B   08/29/22 Treadmill 2.0 mph x 8 min Squat with chest press blue wt ball 2x10 Squat with trunk twist each side 2x10 Modified single leg squats x 10 bil with chair in front Kettle bell swing 2x10 10lb weight Pallof press 10# 2x10 bil Prone quad stretch 2x30 sec  7/ Recumbent bike level 4 x 6 min,  TRX row 2 x 10 TRX hip drop 10 each side TRX deep squats, 15 x  TRX combo of triceps press and lat pull overs 10 x 2 B hamstring curls 35#, 10  Leg extension 5#, each leg, 10 x 2 sets  Prone for PA mobs mid thoracic spine and post ribs to tolerance Prone manual stretching ant thigh/hip musculature with therapist providing PA pressure femoral head , 45 sec each side x 2   08/21/22:  Nustep level 5 x 6 min TRX deep squat x 15 TRX row increased ROM  from last time 2x10 TRX hip drop x 10  Counter push up x 10  Quadruped hip raises 2x10 Prone T (horizontal ABD) 2x10 Prone shoulder ext 2x10   08/17/22:  Nustep level 5 x 6 min Seated leg extension unilateral 5#, 10 x 3 each Seated knee flexion B 25#, 10 x 3 TRX strap,: B shoulder rows TRX: Deep  squats TRX: Standing lateral trunk stretch with shoulders overhead Prone stretch hip flexion, prone knee bend, 45 sec holds, each side, therapist providing overpressure manually and stabilization  Added prone hip flexor stretch, with 10 reps of press ups, on each side    08/14/22 Bike L3x65min Lumbar AROM Standing I,T,Y back to wall on pool noodle x 10 each way Wall angels back to wall x 10 Supine SLR 2x10 bil Supine bridge with march x 10  Supine bridge with step out x 10 Bird dog 2x10   08/07/22 Bike L3x37min R hip ADD/ER stretch hooklying 2x30 sec R hip add stretch supine w/ strap 3x15 sec S/L hip abduction x 15 R/L Bridge with blue TB 2x15 S/L clamshells blue TB 2x10 Standing shld ext w/ cable  I, T, Y x 10 at doorframe STM to R hip adductors, and lateral patellar ligament   07/31/22 Nustep L5 x 6 min  Open books x 10 bil  Windshield wipers x 10  On foam roller - back stroke x 10 for thoracic mobilization, snow angels x 10, pec stretch x 1 min Hip flexor stretch x 1 min bil  Double leg stretch on wall x 3 min for hamstrings.  Standing L -stretch at counter Standing low back stretch at counter x 10   07/27/22: Nustep level 5 x6 min Quadriped alt arm amd leg lifts Seated rows 45# Prone for foam roller IASTM thoracic region and gluteals Supine stretch over 1/2 foam roller with pecs stretch at 90 degrees horiz abd and with B shoulder flexion Seated for lat pull  downs with 45# 2 sets 15 reps Standing hip abd with cable,5#, 10 x 2 sets Leg press, B hips, 25# 10 x 3 Prone props, added pillow under abdomen to allow for better ant hip and trunk relaxation, inst to wt shift side to side to improve thoracic mobility and scapular movement Prone for manual stretching by PT for B ant hip flexors,40 sec holds each side x 3 bouts each.  07/25/22 Therapeutic Exercise: to improve strength and mobility.  Demo, verbal and tactile cues throughout for technique.  Bike L3x46min Wall angels  2x10 with chin tucks Standing shoulder flexion at wall with chin tuck 2# weights x 10  S/L shoulder abduction 2# 2x10 Bil S/L shoulder ER 2x10 LUE; x 10 RUE Standing pallof press GTB x 10 bil Standing shoulder ext GTB 2x10  07/20/22 Therapeutic Exercise: to improve strength and mobility.  Demo, verbal and tactile cues throughout for technique.  Bike L3x68min Assess lumbar AROM Wall angels 2x10 Lumbar extension standing at wall x 10  Wall push ups 2x10 Standing hip extension 2x10 at wall  Staggered stance wall slide into flexion x 10  Seated hamstring stretch 2x30 sec leg propped on mat table Prone on elbows with neck extension x 10  Prone quad stretch 2x30 sec w/ strap  Manual Therapy: to decrease muscle spasm, pain and improve mobility IASTM w/ foam roll to thoracolumbar PS in prone   PATIENT EDUCATION:  Education details:  Person educated: Patient Education method: Programmer, multimedia, Facilities manager, and Handouts Education comprehension: verbalized understanding and returned demonstration  HOME EXERCISE PROGRAM: Access Code: [email or web address] URL: [email or web address] Date: 07/31/2022 Prepared by: Harrie Foreman  Exercises - Supine Lower Trunk Rotation  - 2 x daily - 7 x weekly - 1 sets - 5 reps - 10 sec hold - Cat Cow to Child's Pose  - 1 x daily - 7 x weekly - 1-3 sets - 10 reps - Seated Scapular Retraction  - 1 x daily - 7 x weekly - 1-3 sets - 10 reps - 2-3 sec hold - Doorway Pec Stretch at 90 Degrees Abduction  - 1 x daily - 7 x weekly - 1 sets - 3 reps - 30-60 seconds hold - Supine Bridge with Resistance Band  - 1 x daily - 7 x weekly - 3 sets - 10 reps - Clamshell with Resistance  - 1 x daily - 7 x weekly - 3 sets - 10 reps - Shoulder External Rotation and Scapular Retraction with Resistance  - 1 x daily - 7 x weekly - 3 sets - 10 reps - Prone on Elbows Stretch  - 1 x daily - 7 x weekly - 3 sets - 10 reps - Hip Extension with Resistance Loop  - 1 x daily - 7 x  weekly - 3 sets - 10 reps - Scapular Stability on Wall With Pillowcase (Hemiplegia)  - 1 x daily - 7 x weekly - 3 sets - 10 reps - Supine Hamstring Stretch with Strap  - 1 x daily - 7 x weekly - 3 sets - 30 sec hold - Supine Hip Internal and External Rotation  - 1 x daily - 7 x weekly - 1 sets - 10 reps - Sidelying Open Book Thoracic Lumbar Rotation and Extension  - 1 x daily - 7 x weekly - 1 sets - 10 reps - Modified Thomas Stretch  - 1 x daily - 7 x weekly - 1 sets - 3 reps - 1 min  hold - Double Leg  Hamstring Stretch at Wall  - 1 x daily - 7 x weekly - 1 sets - 1 reps - 5 min  hold - Standing 'L' Stretch at [employer]  - 3 x daily - 7 x weekly - 1 sets - 10 reps - Standing Lumbar Extension with Counter  - 3 x daily - 7 x weekly - 1 sets - 10 reps - Thoracic Foam Roll Mobilization Backstroke  - 1 x daily - 7 x weekly - 2 sets - 10 reps - [employer] on Foam Roll  - 1 x daily - 7 x weekly - 2 sets - 10 reps - Supine Static Chest Stretch on Foam Roll  - 1 x daily - 7 x weekly - 1 sets - 1 reps - 1 min hold   ASSESSMENT:  CLINICAL IMPRESSION: Brayde is continuously able to progress with interventions. He notes improved ability to don and doff pants with less R knee pain. Advanced exercises to work on core strengthening and quad strengthening. Last visit will be on Thurs. 09/07/22, he plans for D/C from PT.    OBJECTIVE IMPAIRMENTS: decreased activity tolerance, decreased mobility, decreased ROM, decreased strength, increased muscle spasms, impaired flexibility, impaired UE functional use, postural dysfunction, and pain.   ACTIVITY LIMITATIONS: standing and locomotion level  PARTICIPATION LIMITATIONS:  walking for exercise  PERSONAL FACTORS: Fitness, Profession, Time since onset of injury/illness/exacerbation, and 1-2 comorbidities: DM, previous back surgery  are also affecting patient's functional outcome.   REHAB POTENTIAL: Excellent  CLINICAL DECISION MAKING: Evolving/moderate  complexity  EVALUATION COMPLEXITY: Moderate   GOALS: Goals reviewed with patient? Yes  SHORT TERM GOALS: Target date: 07/03/2022   Patient will be independent with initial HEP.  Baseline:  Goal status: MET  2.  Patient will report ability to centralize radicular symptoms with therex.  Baseline:  Goal status: IN PROGRESS- 06/30/22  3.  Complete FOTO for back by 2nd visit Baseline:  Goal status: MET  LONG TERM GOALS: Target date: 07/31/2022 extended to 09/07/22  Patient will be independent with advanced/ongoing HEP to improve outcomes and carryover.  Baseline:  Goal status: IN PROGRESS 07/27/22  has a plethora of home exercises  2.  Patient will report 75% improvement in low back radicular pain to improve QOL.  Baseline:  Goal status: IN PROGRESS- 07/25/22 (70% improvement)  3.  Patient will demonstrate full/functional pain free lumbar ROM to perform ADLs.   Baseline:  Goal status: IN PROGRESS- 07/20/22  4.  Patient will report 2/50 on Modified Oswestry to demonstrate improved functional ability.  Baseline: 8 / 50 = 16.0 % Goal status: MET 2 / 50 = 4.0 % 08/29/22  5.  Patient will tolerate 30 min of walking without increased pain. Baseline:  Goal status: IN PROGRESS- 08/14/22 pt reports being able to walk 25- 30 min no pain  6.  Patient to demonstrate ability to achieve and maintain good spinal alignment/posturing and body mechanics needed for daily activities. Baseline:  Goal status: IN PROGRESS 07/27/22: Improving  7.  Patient will report 75% improvement in bil shoulder pain with petting cats and reaching outward with ADLS. Baseline:  Goal status: IN PROGRESS- 07/25/22 (50% improvement)  8  Patient will report 17 on Quick Dash to demonstrate improved functional ability.  Baseline: 27.3 / 100 = 27.3 % Goal status: MET QuickDASH Score: 11.4 / 100 = 11.4 % 08/21/22  PLAN:  PT FREQUENCY: 2x/week  PT DURATION: 8 weeks  PLANNED INTERVENTIONS: Therapeutic exercises,  Therapeutic activity, Neuromuscular re-education, Patient/Family  education, Self Care, Joint mobilization, Aquatic Therapy, Dry Needling, Electrical stimulation, Spinal manipulation, Spinal mobilization, Cryotherapy, Moist heat, Taping, Traction, Ultrasound, Ionotophoresis 4mg /ml Dexamethasone, and Manual therapy.  PLAN FOR NEXT SESSION: extension exercises; ant hip stretch, gluteal strengthening, Postural strengthening and core stability   Darleene Cleaver, PTA 09/04/2022, 4:16 PM

## 2022-09-07 ENCOUNTER — Other Ambulatory Visit: Payer: Self-pay

## 2022-09-07 ENCOUNTER — Ambulatory Visit: Payer: BC Managed Care – PPO

## 2022-09-07 DIAGNOSIS — R252 Cramp and spasm: Secondary | ICD-10-CM | POA: Diagnosis not present

## 2022-09-07 DIAGNOSIS — R293 Abnormal posture: Secondary | ICD-10-CM

## 2022-09-07 DIAGNOSIS — M5416 Radiculopathy, lumbar region: Secondary | ICD-10-CM

## 2022-09-07 DIAGNOSIS — M25511 Pain in right shoulder: Secondary | ICD-10-CM | POA: Diagnosis not present

## 2022-09-07 DIAGNOSIS — M25512 Pain in left shoulder: Secondary | ICD-10-CM | POA: Diagnosis not present

## 2022-09-07 NOTE — Therapy (Signed)
OUTPATIENT PHYSICAL THERAPY TREATMENT    Patient Name: Anthony Russell MRN: [id number] DOB:25-Sep-1980, 42 y.o., male Today's Date: 09/07/2022  END OF SESSION:  PT End of Session - 09/07/22 1447     Visit Number 21    Date for PT Re-Evaluation 09/07/22    Authorization Type [employer]    PT Start Time 1448    PT Stop Time 1530    PT Time Calculation (min) 42 min    Activity Tolerance Patient tolerated treatment well    Behavior During Therapy WFL for tasks assessed/performed                   Past Medical History:  Diagnosis Date   Allergy    Allergy, unspecified not elsewhere classified    rx w/ OTC antihistamines PRN   Anxiety    Asthma    Atypical chest pain    recent neg eval w/ normal cxr/ekg   Depression    Diverticulosis    DM (diabetes mellitus) (HCC)    Dyspepsia    on protonix 40mg /d   Esophagitis    GERD (gastroesophageal reflux disease)    on protonix 40mg /d   HTN (hypertension)    Hyperlipidemia    on diet alone   IBS (irritable bowel syndrome)    Lumbar back pain    s/p lumbar laminectomy 2007 by DrCabbell   Migraines    Overweight(278.02)    weight Jan10=246#.[name].he was 225# in 9/07...diet and exercise was discussed   Sleep apnea    not wearing c-pap currently   Past Surgical History:  Procedure Laterality Date   DENTAL SURGERY     LUMBAR LAMINECTOMY  01/30/2005   DrCabbell   TEAR DUCT PROBING     Patient Active Problem List   Diagnosis Date Noted   Lumbar radiculopathy 05/10/2022   Capsulitis of left shoulder 05/10/2022   Sinus tachycardia 12/28/2021   Impingement of knee joint, right 12/20/2021   Achilles tendinitis, right leg 12/20/2021   Rotator cuff tendinitis, right 11/04/2021   Encounter for screening for other metabolic disorders 04/07/2021   Dyspnea on exertion 04/07/2021   Migraines 02/23/2021   Lumbar back pain 02/23/2021   HTN (hypertension) 02/23/2021   Dyspepsia 02/23/2021   DM (diabetes mellitus) (HCC) 02/23/2021    Nasal septal deviation 02/17/2021   Laryngitis 01/19/2021   Asymmetric tonsils 12/01/2020   Subacromial bursitis of right shoulder joint 05/17/2020   Hypersomnia 11/03/2015   Obesity 11/03/2015   Allergic rhinitis 04/15/2014   Asthma in adult 04/15/2014   GERD (gastroesophageal reflux disease) 04/15/2014   IBS (irritable bowel syndrome) 04/01/2013   Nausea alone 01/15/2013   Diarrhea 01/15/2013   Depression 10/15/2012   Lesion of eyebrow 02/09/2012   Gastroparesis 07/05/2011   Anxiety 07/05/2010   TESTOSTERONE DEFICIENCY 05/26/2008   Hyperlipidemia 02/06/2008   Facet arthropathy, lumbar 02/06/2008   Atypical chest pain 02/06/2008    PCP: Esperanza Richters, PA-C   REFERRING PROVIDER: Lenda Kelp, MD   REFERRING DIAG:  54.16 (ICD-10-CM) - Lumbar radiculopathy  M75.81 (ICD-10-CM) - Rotator cuff tendinitis, right  M77.8 (ICD-10-CM) - Capsulitis of left shoulder    Rationale for Evaluation and Treatment: Rehabilitation  THERAPY DIAG:  Radiculopathy, lumbar region  Acute pain of both shoulders  Abnormal posture  ONSET DATE: R leg pain several years; Shoulders - months  SUBJECTIVE:  SUBJECTIVE STATEMENT: I am overall ready to be done, I still have some knee pain and shoulder stiffness but I know I have to keep working out to manage it.  PERTINENT HISTORY:  DM, Lumbar laminectomy L 2007  PAIN:  Are you having pain? No 5/10 when squatting or putting on pants  PRECAUTIONS: None  WEIGHT BEARING RESTRICTIONS: No  FALLS:  Has patient fallen in last 6 months? No  LIVING ENVIRONMENT: Lives with: lives alone Lives in: House/apartment Stairs: Yes: External: 2 flights steps; can reach both Has following equipment at home: None  OCCUPATION: Sport and exercise psychologist, works at home; has sit  down and stand up desk  PLOF: Independent  PATIENT GOALS: get regular movement back in back, be able to exercise without pain and pet my cats  NEXT MD VISIT: 06/19/22  OBJECTIVE:   DIAGNOSTIC FINDINGS:  XR Left - negative XR Right - Mild glenohumeral degenerative changes without loss of joint space.  [place] Right - thickening of the coracohumeral ligament and the deltoid ligament   PATIENT SURVEYS:  Modified Oswestry 8 / 50 = 16.0 %  Quick Dash 27.3 / 100 = 27.3 % FOTO TBD  SCREENING FOR RED FLAGS: Neg  COGNITION: Overall cognitive status: Within functional limits for tasks assessed     SENSATION: WFL  MUSCLE LENGTH: Marked tightness in bil HS, quads, hip flexors, hip rotators and lumbar  POSTURE: rounded shoulders, forward head, and tight QL with depressed R scapula; left lateral shift  PALPATION: R gluteus medius and lumbar R>L  LUMBAR ROM:   AROM eval 07/20/22 08/14/22 09/07/22  Flexion Limited by HS to knees Right below knees Mid shin To ankle  Extension 75% limited 60% limit- pain 60% limit Quite restricted to 20 %  Right lateral flexion 50% limited 40% limit 30% limit 75% normal  Left lateral flexion 50% limited 40% limit 30% limit 75% normal   Right rotation 25% limited 25% limit    Left rotation 25% limited 25% limit     (Blank rows = not tested)  LOWER EXTREMITY ROM:   marked flexibility deficits affecting full range of bil hip and knee joints  LOWER EXTREMITY MMT:  Grossly 5/5 in  B LE   LUMBAR SPECIAL TESTS:  Straight leg raise test: Positive and Slump test: Positive  SHOULDER SPECIAL TESTS: Impingement tests: Neer impingement test: negative and Hawkins/Kennedy impingement test: positive  R  Rotator cuff assessment: neg bil  UPPER EXTREMITY ROM: Functional bil shoulder ROM  Active ROM Right eval Left eval  Shoulder flexion    Shoulder extension    Shoulder abduction    Shoulder adduction    Shoulder extension    Shoulder internal rotation     Shoulder external rotation    Elbow flexion    Elbow extension    Wrist flexion    Wrist extension    Wrist ulnar deviation    Wrist radial deviation    Wrist pronation    Wrist supination     (Blank rows = not tested)  UPPER EXTREMITY MMT: Grossly 5/5 bil except R  ER 4+5 with mild pain   TODAY'S TREATMENT:  DATE: 09/07/22:  TRX row 2 x 10 TRX hip drop 10 each side TRX deep squats, 15 x  TRX combo of triceps press and lat pull overs 10 x 2 Recumbent cycle level 4 , 6 min  BATCA knee extension unilateral  10# each, 10 x 2 BATCA knee flex unilateral 35# each, 10 x 2  09/04/22 Bike L3x25min TRX chest press 2 x 10  TRX biceps 2 x 10 Knee extension machine 10# 2x10 unilateral R/L Fwd step downs 4' step x 10 with LLE for R eccentric strengthening Hip hike from 4' step 2x10- second set added black TB for more quad activation Trunk rotation x 10 5# both ways  08/31/22:  Recumbent  TRX: BATCA seated shoulder press 15# hands in neutral position 2 x 10 BATCA pec flys 15 # 2 x 10 Kettle bell swing 10# 2 sets of 10 Prone manual stretch quads B with PA pressure femoral head 60 sec holds 3 sets B   08/29/22 Treadmill 2.0 mph x 8 min Squat with chest press blue wt ball 2x10 Squat with trunk twist each side 2x10 Modified single leg squats x 10 bil with chair in front Kettle bell swing 2x10 10lb weight Pallof press 10# 2x10 bil Prone quad stretch 2x30 sec  7/ Recumbent bike level 4 x 6 min,  TRX row 2 x 10 TRX hip drop 10 each side TRX deep squats, 15 x  TRX combo of triceps press and lat pull overs 10 x 2 B hamstring curls 35#, 10  Leg extension 5#, each leg, 10 x 2 sets  Prone for PA mobs mid thoracic spine and post ribs to tolerance Prone manual stretching ant thigh/hip musculature with therapist providing PA pressure femoral head , 45 sec each side x  2   08/21/22:  Nustep level 5 x 6 min TRX deep squat x 15 TRX row increased ROM  from last time 2x10 TRX hip drop x 10  Counter push up x 10  Quadruped hip raises 2x10 Prone T (horizontal ABD) 2x10 Prone shoulder ext 2x10   08/17/22:  Nustep level 5 x 6 min Seated leg extension unilateral 5#, 10 x 3 each Seated knee flexion B 25#, 10 x 3 TRX strap,: B shoulder rows TRX: Deep squats TRX: Standing lateral trunk stretch with shoulders overhead Prone stretch hip flexion, prone knee bend, 45 sec holds, each side, therapist providing overpressure manually and stabilization  Added prone hip flexor stretch, with 10 reps of press ups, on each side    08/14/22 Bike L3x57min Lumbar AROM Standing I,T,Y back to wall on pool noodle x 10 each way Wall angels back to wall x 10 Supine SLR 2x10 bil Supine bridge with march x 10  Supine bridge with step out x 10 Bird dog 2x10   08/07/22 Bike L3x67min R hip ADD/ER stretch hooklying 2x30 sec R hip add stretch supine w/ strap 3x15 sec S/L hip abduction x 15 R/L Bridge with blue TB 2x15 S/L clamshells blue TB 2x10 Standing shld ext w/ cable  I, T, Y x 10 at doorframe STM to R hip adductors, and lateral patellar ligament   07/31/22 Nustep L5 x 6 min  Open books x 10 bil  Windshield wipers x 10  On foam roller - back stroke x 10 for thoracic mobilization, snow angels x 10, pec stretch x 1 min Hip flexor stretch x 1 min bil  Double leg stretch on wall x 3 min for hamstrings.  Standing L -stretch  at counter Standing low back stretch at counter x 10   07/27/22: Nustep level 5 x6 min Quadriped alt arm amd leg lifts Seated rows 45# Prone for foam roller IASTM thoracic region and gluteals Supine stretch over 1/2 foam roller with pecs stretch at 90 degrees horiz abd and with B shoulder flexion Seated for lat pull downs with 45# 2 sets 15 reps Standing hip abd with cable,5#, 10 x 2 sets Leg press, B hips, 25# 10 x 3 Prone props, added pillow  under abdomen to allow for better ant hip and trunk relaxation, inst to wt shift side to side to improve thoracic mobility and scapular movement Prone for manual stretching by PT for B ant hip flexors,40 sec holds each side x 3 bouts each.  07/25/22 Therapeutic Exercise: to improve strength and mobility.  Demo, verbal and tactile cues throughout for technique.  Bike L3x82min Wall angels 2x10 with chin tucks Standing shoulder flexion at wall with chin tuck 2# weights x 10  S/L shoulder abduction 2# 2x10 Bil S/L shoulder ER 2x10 LUE; x 10 RUE Standing pallof press GTB x 10 bil Standing shoulder ext GTB 2x10  07/20/22 Therapeutic Exercise: to improve strength and mobility.  Demo, verbal and tactile cues throughout for technique.  Bike L3x40min Assess lumbar AROM Wall angels 2x10 Lumbar extension standing at wall x 10  Wall push ups 2x10 Standing hip extension 2x10 at wall  Staggered stance wall slide into flexion x 10  Seated hamstring stretch 2x30 sec leg propped on mat table Prone on elbows with neck extension x 10  Prone quad stretch 2x30 sec w/ strap  Manual Therapy: to decrease muscle spasm, pain and improve mobility IASTM w/ foam roll to thoracolumbar PS in prone   PATIENT EDUCATION:  Education details:  Person educated: Patient Education method: Programmer, multimedia, Facilities manager, and Handouts Education comprehension: verbalized understanding and returned demonstration  HOME EXERCISE PROGRAM: Access Code: [email or web address] URL: [email or web address] Date: 07/31/2022 Prepared by: Harrie Foreman  Exercises - Supine Lower Trunk Rotation  - 2 x daily - 7 x weekly - 1 sets - 5 reps - 10 sec hold - Cat Cow to Child's Pose  - 1 x daily - 7 x weekly - 1-3 sets - 10 reps - Seated Scapular Retraction  - 1 x daily - 7 x weekly - 1-3 sets - 10 reps - 2-3 sec hold - Doorway Pec Stretch at 90 Degrees Abduction  - 1 x daily - 7 x weekly - 1 sets - 3 reps - 30-60 seconds hold - Supine  Bridge with Resistance Band  - 1 x daily - 7 x weekly - 3 sets - 10 reps - Clamshell with Resistance  - 1 x daily - 7 x weekly - 3 sets - 10 reps - Shoulder External Rotation and Scapular Retraction with Resistance  - 1 x daily - 7 x weekly - 3 sets - 10 reps - Prone on Elbows Stretch  - 1 x daily - 7 x weekly - 3 sets - 10 reps - Hip Extension with Resistance Loop  - 1 x daily - 7 x weekly - 3 sets - 10 reps - Scapular Stability on Wall With Pillowcase (Hemiplegia)  - 1 x daily - 7 x weekly - 3 sets - 10 reps - Supine Hamstring Stretch with Strap  - 1 x daily - 7 x weekly - 3 sets - 30 sec hold - Supine Hip Internal and External Rotation  - 1 x daily - 7  x weekly - 1 sets - 10 reps - Sidelying Open Book Thoracic Lumbar Rotation and Extension  - 1 x daily - 7 x weekly - 1 sets - 10 reps - Modified Thomas Stretch  - 1 x daily - 7 x weekly - 1 sets - 3 reps - 1 min  hold - Double Leg Hamstring Stretch at Wall  - 1 x daily - 7 x weekly - 1 sets - 1 reps - 5 min  hold - Standing 'L' Stretch at [employer]  - 3 x daily - 7 x weekly - 1 sets - 10 reps - Standing Lumbar Extension with Counter  - 3 x daily - 7 x weekly - 1 sets - 10 reps - Thoracic Foam Roll Mobilization Backstroke  - 1 x daily - 7 x weekly - 2 sets - 10 reps - [employer] on Foam Roll  - 1 x daily - 7 x weekly - 2 sets - 10 reps - Supine Static Chest Stretch on Foam Roll  - 1 x daily - 7 x weekly - 1 sets - 1 reps - 1 min hold   ASSESSMENT:  CLINICAL IMPRESSION: Cambron attended his final skilled PT session today. He is competent to continue to strengthening, cardiovascular, endurance training  at this point.  He still has stiffness B shoulders, his entire spine is quite rigid.  Did advise him to speak with his MD at next appt about possible long term medication management for overall stiffness, arthritis management. DC today OBJECTIVE IMPAIRMENTS: decreased activity tolerance, decreased mobility, decreased ROM, decreased strength,  increased muscle spasms, impaired flexibility, impaired UE functional use, postural dysfunction, and pain.   ACTIVITY LIMITATIONS: standing and locomotion level  PARTICIPATION LIMITATIONS:  walking for exercise  PERSONAL FACTORS: Fitness, Profession, Time since onset of injury/illness/exacerbation, and 1-2 comorbidities: DM, previous back surgery  are also affecting patient's functional outcome.   REHAB POTENTIAL: Excellent  CLINICAL DECISION MAKING: Evolving/moderate complexity  EVALUATION COMPLEXITY: Moderate   GOALS: Goals reviewed with patient? Yes  SHORT TERM GOALS: Target date: 07/03/2022   Patient will be independent with initial HEP.  Baseline:  Goal status: MET  2.  Patient will report ability to centralize radicular symptoms with therex.  Baseline:  Goal status: IN PROGRESS- 06/30/22 09/07/22: goal met  3.  Complete FOTO for back by 2nd visit Baseline:  Goal status: MET  LONG TERM GOALS: Target date: 07/31/2022 extended to 09/07/22  Patient will be independent with advanced/ongoing HEP to improve outcomes and carryover.  Baseline:  Goal status: IN PROGRESS 07/27/22  has a plethora of home exercises  2.  Patient will report 75% improvement in low back radicular pain to improve QOL.  Baseline:  Goal status: IN PROGRESS- 07/25/22 (70% improvement) 09/07/22 met   3.  Patient will demonstrate full/functional pain free lumbar ROM to perform ADLs.   Baseline:  Goal status: IN PROGRESS- 07/20/22 09/07/22: met  4.  Patient will report 2/50 on Modified Oswestry to demonstrate improved functional ability.  Baseline: 8 / 50 = 16.0 % Goal status: MET 2 / 50 = 4.0 % 08/29/22 09/07/22 met previously no new back pain per pt  5.  Patient will tolerate 30 min of walking without increased pain. Baseline:  Goal status: IN PROGRESS- 08/14/22 pt reports being able to walk 25- 30 min no pain 09/07/22:  patient reports able to walk 30 min, with some pain R knee and ankle but light   6.   Patient to demonstrate ability  to achieve and maintain good spinal alignment/posturing and body mechanics needed for daily activities. Baseline:  Goal status: IN PROGRESS 07/27/22:  09/07/22; met   7.  Patient will report 75% improvement in bil shoulder pain with petting cats and reaching outward with ADLS. Baseline:  Goal status: IN PROGRESS- 07/25/22 (50% improvement) 09/07/22: improved some due to ongoing stiffness, not painful but really tight  8  Patient will report 17 on Quick Dash to demonstrate improved functional ability.  Baseline: 27.3 / 100 = 27.3 % Goal status: MET QuickDASH Score: 11.4 / 100 = 11.4 % 08/21/22 09/07/22: met previously PLAN:  PT FREQUENCY: 2x/week  PT DURATION: 8 weeks  PLANNED INTERVENTIONS: Therapeutic exercises, Therapeutic activity, Neuromuscular re-education, Patient/Family education, Self Care, Joint mobilization, Aquatic Therapy, Dry Needling, Electrical stimulation, Spinal manipulation, Spinal mobilization, Cryotherapy, Moist heat, Taping, Traction, Ultrasound, Ionotophoresis 4mg /ml Dexamethasone, and Manual therapy.  PLAN FOR NEXT SESSION: DC at this time   Early Chars, PT, DPT, OCS 09/07/2022, 3:45 PM

## 2022-09-15 ENCOUNTER — Ambulatory Visit: Payer: BC Managed Care – PPO | Admitting: Cardiology

## 2022-09-15 ENCOUNTER — Encounter: Payer: Self-pay | Admitting: Cardiology

## 2022-09-15 VITALS — BP 118/76 | HR 95 | Ht 70.0 in | Wt 188.0 lb

## 2022-09-15 DIAGNOSIS — I10 Essential (primary) hypertension: Secondary | ICD-10-CM

## 2022-09-15 DIAGNOSIS — R0609 Other forms of dyspnea: Secondary | ICD-10-CM

## 2022-09-15 DIAGNOSIS — K219 Gastro-esophageal reflux disease without esophagitis: Secondary | ICD-10-CM

## 2022-09-15 DIAGNOSIS — R0789 Other chest pain: Secondary | ICD-10-CM | POA: Diagnosis not present

## 2022-09-15 NOTE — Progress Notes (Signed)
Cardiology Office Note:    Date:  09/15/2022   ID:  Anthony Russell, DOB 10-16-1980, MRN [id number]  PCP:  Esperanza Richters, PA-C  Cardiologist:  Gypsy Balsam, MD    Referring MD: Esperanza Richters, [place]   Chief Complaint  Patient presents with   Follow-up    History of Present Illness:    Anthony Russell is a 42 y.o. male with past medical history significant for diabetes, anxiety depression, essential hypertension, hyperlipidemia, obesity.  He comes today to my office for follow-up he is doing terrific he joined some diet program he lost significant amount of weight he sugar is perfect he is feeling dramatically better he started exercising on the regular basis.  Denies have any chest pain tightness squeezing pressure burning chest no palpitation dizziness swelling of lower extremities  Past Medical History:  Diagnosis Date   Allergy    Allergy, unspecified not elsewhere classified    rx w/ OTC antihistamines PRN   Anxiety    Asthma    Atypical chest pain    recent neg eval w/ normal cxr/ekg   Depression    Diverticulosis    DM (diabetes mellitus) (HCC)    Dyspepsia    on protonix 40mg /d   Esophagitis    GERD (gastroesophageal reflux disease)    on protonix 40mg /d   HTN (hypertension)    Hyperlipidemia    on diet alone   IBS (irritable bowel syndrome)    Lumbar back pain    s/p lumbar laminectomy 2007 by DrCabbell   Migraines    Overweight(278.02)    weight Jan10=246#.[name].he was 225# in 9/07...diet and exercise was discussed   Sleep apnea    not wearing c-pap currently    Past Surgical History:  Procedure Laterality Date   DENTAL SURGERY     LUMBAR LAMINECTOMY  01/30/2005   DrCabbell   TEAR DUCT PROBING      Current Medications: Current Meds  Medication Sig   albuterol (VENTOLIN HFA) 108 (90 Base) MCG/ACT inhaler INHALE 2 PUFFS INTO THE LUNGS EVERY 6 HOURS AS NEEDED FOR WHEEZING OR SHORTNESS OF BREATH (Patient taking differently: Inhale 2 puffs into the lungs  every 6 (six) hours as needed for wheezing or shortness of breath.)   atorvastatin (LIPITOR) 10 MG tablet TAKE 1 TABLET(10 MG) BY MOUTH DAILY (Patient taking differently: Take 10 mg by mouth daily.)   azelastine (ASTELIN) 0.1 % nasal spray Place 1 spray into both nostrils 2 (two) times daily. Use in each nostril as directed   baclofen (LIORESAL) 10 MG tablet Take 1 tablet (10 mg total) by mouth 2 (two) times daily.   budesonide-formoterol (SYMBICORT) 160-4.5 MCG/ACT inhaler Inhale 2 puffs into the lungs in the morning and at bedtime.   calcium carbonate (OS-CAL) 1250 (500 Ca) MG chewable tablet Chew 2 tablets by mouth daily.   Continuous Glucose Sensor (FREESTYLE LIBRE 3 SENSOR) MISC USE SENSOR TO CHECK BLOOD GLUCOSE CONTIUOUSLY. (Patient taking differently: 1 each by Other route See admin instructions. USE SENSOR TO CHECK BLOOD GLUCOSE CONTIUOUSLY.)   dicyclomine (BENTYL) 10 MG capsule Take 1 capsule (10 mg total) by mouth 3 (three) times daily as needed for spasms (abdominal pain).   famotidine (PEPCID) 20 MG tablet Take 1 tablet (20 mg total) by mouth at bedtime.   fluticasone (FLONASE) 50 MCG/ACT nasal spray Place 2 sprays into both nostrils daily.   glycopyrrolate (ROBINUL) 2 MG tablet TAKE 1 TABLET BY MOUTH TWICE DAILY AS NEEDED FOR ABDOMINAL DISCOMFORT (Patient taking  differently: Take 1 mg by mouth 2 (two) times daily as needed (abdominal discomfort).)   ibuprofen (ADVIL) 600 MG tablet Take 1 tablet (600 mg total) by mouth every 8 (eight) hours as needed. (Patient taking differently: Take 600 mg by mouth every 8 (eight) hours as needed for mild pain or moderate pain.)   levocetirizine (XYZAL) 5 MG tablet Take 1 tablet (5 mg total) by mouth every evening.   metFORMIN (GLUCOPHAGE) 1000 MG tablet TAKE 1 TABLET(1000 MG) BY MOUTH TWICE DAILY WITH A MEAL (Patient taking differently: Take 1,000 mg by mouth 2 (two) times daily with a meal.)   Multiple Vitamin (MULTIVITAMIN ADULT PO) Take 2 tablets by  mouth daily.   Nutritional Supplements (PYCNOGENOL) 300-30 MG CAPS Take 1 capsule by mouth 3 (three) times daily.   pantoprazole (PROTONIX) 40 MG tablet TAKE 1 TABLET(40 MG) BY MOUTH TWICE DAILY BEFORE A MEAL (Patient taking differently: Take 40 mg by mouth 2 (two) times daily before a meal.)   propranolol (INDERAL) 20 MG tablet Take 1 tablet (20 mg total) by mouth 2 (two) times daily.     Allergies:   Patient has no known allergies.   Social History   Socioeconomic History   Marital status: Single    Spouse name: Not on file   Number of children: 0   Years of education: Not on file   Highest education level: Associate degree: academic program  Occupational History   Occupation: Systems analyst  Tobacco Use   Smoking status: Former    Current packs/day: 0.00    Average packs/day: 0.1 packs/day for 8.0 years (0.8 ttl pk-yrs)    Types: Cigarettes    Start date: 03/03/2003    Quit date: 03/03/2011    Years since quitting: 11.5   Smokeless tobacco: Never  Vaping Use   Vaping status: Never Used  Substance and Sexual Activity   Alcohol use: Yes    Comment: once or twice a month   Drug use: No   Sexual activity: Not on file  Other Topics Concern   Not on file  Social History Narrative   3 cups caffeine a day   Social Determinants of Health   Financial Resource Strain: Low Risk  (04/24/2022)   Overall Financial Resource Strain (CARDIA)    Difficulty of Paying Living Expenses: Not very hard  Food Insecurity: No Food Insecurity (04/24/2022)   Hunger Vital Sign    Worried About Running Out of Food in the Last Year: Never true    Ran Out of Food in the Last Year: Never true  Transportation Needs: No Transportation Needs (04/24/2022)   PRAPARE - Administrator, Civil Service (Medical): No    Lack of Transportation (Non-Medical): No  Physical Activity: Insufficiently Active (04/24/2022)   Exercise Vital Sign    Days of Exercise per Week: 3 days    Minutes of Exercise  per Session: 10 min  Stress: No Stress Concern Present (04/24/2022)   [employer] of Occupational Health - Occupational Stress Questionnaire    Feeling of Stress : Only a little  Social Connections: Socially Isolated (04/24/2022)   Social Connection and Isolation Panel [NHANES]    Frequency of Communication with Friends and Family: Once a week    Frequency of Social Gatherings with Friends and Family: Once a week    Attends Religious Services: Never    Database administrator or Organizations: No    Attends Banker Meetings: Not on file  Marital Status: Never married     Family History: The patient's family history includes Allergies in his brother, father, and mother; Breast cancer in his maternal aunt; Diabetes in his father and mother; Heart disease in his brother; Hyperlipidemia in his father; Irritable bowel syndrome in his brother and mother. There is no history of Colon cancer, Esophageal cancer, Rectal cancer, or Stomach cancer. ROS:   Please see the history of present illness.    All 14 point review of systems negative except as described per history of present illness  EKGs/Labs/Other Studies Reviewed:    EKG Interpretation Date/Time:  Friday September 15 2022 15:22:52 EDT Ventricular Rate:  94 PR Interval:  166 QRS Duration:  98 QT Interval:  354 QTC Calculation: 442 R Axis:   91  Text Interpretation: Normal sinus rhythm Rightward axis When compared with ECG of 26-Dec-2021 15:19, Incomplete right bundle branch block is no longer Present Nonspecific T wave abnormality no longer evident in Anterior leads Confirmed by Gypsy Balsam [phone]) on 09/15/2022 3:40:36 PM    Recent Labs: 05/10/2022: Hemoglobin 15.6; Platelets 185 08/15/2022: ALT 21; BUN 18; Creatinine, Ser 0.86; Potassium 4.2; Sodium 140  Recent Lipid Panel    Component Value Date/Time   CHOL 213 (H) 08/15/2022 1140   TRIG 153.0 (H) 08/15/2022 1140   HDL 32.50 (L) 08/15/2022 1140   CHOLHDL 7  08/15/2022 1140   VLDL 30.6 08/15/2022 1140   LDLCALC 150 (H) 08/15/2022 1140   LDLDIRECT 132.0 08/14/2019 1103    Physical Exam:    VS:  BP 118/76 (BP Location: Left Arm, Patient Position: Sitting)   Pulse 95   Ht 5\' 10"  (1.778 m)   Wt 188 lb (85.3 kg)   SpO2 96%   BMI 26.98 kg/m     Wt Readings from Last 3 Encounters:  09/15/22 188 lb (85.3 kg)  08/15/22 188 lb 6.4 oz (85.5 kg)  06/22/22 189 lb (85.7 kg)     GEN:  Well nourished, well developed in no acute distress HEENT: Normal NECK: No JVD; No carotid bruits LYMPHATICS: No lymphadenopathy CARDIAC: RRR, no murmurs, no rubs, no gallops RESPIRATORY:  Clear to auscultation without rales, wheezing or rhonchi  ABDOMEN: Soft, non-tender, non-distended MUSCULOSKELETAL:  No edema; No deformity  SKIN: Warm and dry LOWER EXTREMITIES: no swelling NEUROLOGIC:  Alert and oriented x 3 PSYCHIATRIC:  Normal affect   ASSESSMENT:    1. Dyspnea on exertion   2. Primary hypertension   3. Gastroesophageal reflux disease without esophagitis   4. Atypical chest pain    PLAN:    In order of problems listed above:  Multiple risk factors for coronary artery disease now well-managed.  He did have coronary CT angio calcium score 0 normal coronary arteries.  He changed dramatically his lifestyle started dieting lost significant amount of weight improved quite significantly. Essential hypertension blood pressure is perfect. Dyslipidemia still uncontrolled his LDL of 150 HDL 32 he was just put on Lipitor.  Will make arrangements for him to have fasting lipid profile done and see him back in 1 year   Medication Adjustments/Labs and Tests Ordered: Current medicines are reviewed at length with the patient today.  Concerns regarding medicines are outlined above.  Orders Placed This Encounter  Procedures   EKG 12-Lead   Medication changes: No orders of the defined types were placed in this encounter.   Signed, Georgeanna Lea, MD,  [facility] 09/15/2022 3:50 PM    [facility]

## 2022-09-15 NOTE — Patient Instructions (Signed)
Medication Instructions:  Your physician recommends that you continue on your current medications as directed. Please refer to the Current Medication list given to you today.  *If you need a refill on your cardiac medications before your next appointment, please call your pharmacy*   Lab Work: None If you have labs (blood work) drawn today and your tests are completely normal, you will receive your results only by: [place] (if you have MyChart) OR A paper copy in the mail If you have any lab test that is abnormal or we need to change your treatment, we will call you to review the results.   Testing/Procedures: None   Follow-Up: At [facility], you and your health needs are our priority.  As part of our continuing mission to provide you with exceptional heart care, we have created designated Provider Care Teams.  These Care Teams include your primary Cardiologist (physician) and Advanced Practice Providers (APPs -  Physician Assistants and Nurse Practitioners) who all work together to provide you with the care you need, when you need it.  We recommend signing up for the patient portal called "MyChart".  Sign up information is provided on this After Visit Summary.  MyChart is used to connect with patients for Virtual Visits (Telemedicine).  Patients are able to view lab/test results, encounter notes, upcoming appointments, etc.  Non-urgent messages can be sent to your provider as well.   To learn more about what you can do with MyChart, go to [email or web address].    Your next appointment:   1 year(s)  Provider:   Jenne Campus, MD    Other Instructions

## 2022-10-03 DIAGNOSIS — F4323 Adjustment disorder with mixed anxiety and depressed mood: Secondary | ICD-10-CM | POA: Diagnosis not present

## 2022-10-25 LAB — HM DIABETES EYE EXAM

## 2022-11-04 ENCOUNTER — Other Ambulatory Visit: Payer: Self-pay | Admitting: Medical

## 2022-11-09 ENCOUNTER — Ambulatory Visit: Payer: BC Managed Care – PPO | Admitting: Medical

## 2022-11-09 VITALS — BP 107/65 | HR 78 | Temp 98.7°F | Resp 16 | Ht 70.0 in | Wt 190.0 lb

## 2022-11-09 DIAGNOSIS — K219 Gastro-esophageal reflux disease without esophagitis: Secondary | ICD-10-CM

## 2022-11-09 DIAGNOSIS — E785 Hyperlipidemia, unspecified: Secondary | ICD-10-CM | POA: Diagnosis not present

## 2022-11-09 DIAGNOSIS — E1169 Type 2 diabetes mellitus with other specified complication: Secondary | ICD-10-CM | POA: Diagnosis not present

## 2022-11-09 DIAGNOSIS — Z23 Encounter for immunization: Secondary | ICD-10-CM

## 2022-11-09 DIAGNOSIS — E119 Type 2 diabetes mellitus without complications: Secondary | ICD-10-CM

## 2022-11-09 DIAGNOSIS — Z1159 Encounter for screening for other viral diseases: Secondary | ICD-10-CM

## 2022-11-09 NOTE — Addendum Note (Signed)
Addended by: Wilford Corner on: 11/09/2022 09:01 AM   Modules accepted: Orders

## 2022-11-09 NOTE — Progress Notes (Signed)
Subjective:    Patient ID: Anthony Russell, male    DOB: [birth year], 42 y.o.   MRN: [id number]  HPI Pt in for follow up.  Last visit  A/P in "  "Type 2 Diabetes Mellitus: Well controlled with Metformin 1000mg  BID and lifestyle modifications. Recent weight loss and improved diet. Last A1c was 5.9 on 05/02/2022. Home glucose monitoring shows values in the 80s-90s, highest 111. -Check A1c today. -If A1c remains tightly controlled, consider reducing Metformin to 500mg  BID. -Continue home glucose monitoring.   Gastroesophageal Reflux Disease: Improved symptoms with weight loss and dietary changes. -No current use of Protonix. -Continue current healthy diet.   Hyperlipidemia: On Atorvastatin. -Check lipid panel today."  Since last visit he did get diabetic eye exam. Pt has decided to stop metformin. No side effects but wanted to see if could control diabetes with diet alone.  Genella Rife still controlled/not present. Not having to use protonix.  High cholesterol- pt is still on atorvastatin.  Pt will get flu vaccine today.     Review of Systems  Constitutional:  Negative for chills, fatigue and fever.  Respiratory:  Negative for cough, chest tightness, shortness of breath and wheezing.   Cardiovascular:  Negative for chest pain and palpitations.  Gastrointestinal:  Negative for abdominal pain and constipation.  Genitourinary:  Negative for dysuria, flank pain and frequency.  Musculoskeletal:  Negative for back pain, joint swelling and myalgias.  Skin:  Negative for rash.  Neurological:  Negative for dizziness, light-headedness and headaches.  Hematological:  Negative for adenopathy. Does not bruise/bleed easily.  Psychiatric/Behavioral:  Negative for behavioral problems.    Past Medical History:  Diagnosis Date   Allergy    Allergy, unspecified not elsewhere classified    rx w/ OTC antihistamines PRN   Anxiety    Asthma    Atypical chest pain    recent neg eval w/ normal cxr/ekg    Depression    Diverticulosis    DM (diabetes mellitus) (HCC)    Dyspepsia    on protonix 40mg /d   Esophagitis    GERD (gastroesophageal reflux disease)    on protonix 40mg /d   HTN (hypertension)    Hyperlipidemia    on diet alone   IBS (irritable bowel syndrome)    Lumbar back pain    s/p lumbar laminectomy 2007 by DrCabbell   Migraines    Overweight(278.02)    weight Jan10=246#.[name].he was 225# in 9/07...diet and exercise was discussed   Sleep apnea    not wearing c-pap currently     Social History   Socioeconomic History   Marital status: Single    Spouse name: Not on file   Number of children: 0   Years of education: Not on file   Highest education level: Associate degree: academic program  Occupational History   Occupation: Systems analyst  Tobacco Use   Smoking status: Former    Current packs/day: 0.00    Average packs/day: 0.1 packs/day for 8.0 years (0.8 ttl pk-yrs)    Types: Cigarettes    Start date: 03/03/2003    Quit date: 03/03/2011    Years since quitting: 11.6   Smokeless tobacco: Never  Vaping Use   Vaping status: Never Used  Substance and Sexual Activity   Alcohol use: Yes    Comment: once or twice a month   Drug use: No   Sexual activity: Not on file  Other Topics Concern   Not on file  Social History Narrative   3  cups caffeine a day   Social Determinants of Health   Financial Resource Strain: Low Risk  (04/24/2022)   Overall Financial Resource Strain (CARDIA)    Difficulty of Paying Living Expenses: Not very hard  Food Insecurity: No Food Insecurity (04/24/2022)   Hunger Vital Sign    Worried About Running Out of Food in the Last Year: Never true    Ran Out of Food in the Last Year: Never true  Transportation Needs: No Transportation Needs (04/24/2022)   PRAPARE - Administrator, Civil Service (Medical): No    Lack of Transportation (Non-Medical): No  Physical Activity: Insufficiently Active (04/24/2022)   Exercise Vital Sign     Days of Exercise per Week: 3 days    Minutes of Exercise per Session: 10 min  Stress: No Stress Concern Present (04/24/2022)   [employer] of Occupational Health - Occupational Stress Questionnaire    Feeling of Stress : Only a little  Social Connections: Socially Isolated (04/24/2022)   Social Connection and Isolation Panel [NHANES]    Frequency of Communication with Friends and Family: Once a week    Frequency of Social Gatherings with Friends and Family: Once a week    Attends Religious Services: Never    Database administrator or Organizations: No    Attends Engineer, structural: Not on file    Marital Status: Never married  Catering manager Violence: Not on file    Past Surgical History:  Procedure Laterality Date   DENTAL SURGERY     LUMBAR LAMINECTOMY  01/30/2005   DrCabbell   TEAR DUCT PROBING      Family History  Problem Relation Age of Onset   Allergies Mother    Irritable bowel syndrome Mother    Diabetes Mother    Allergies Father    Diabetes Father    Hyperlipidemia Father    Irritable bowel syndrome Brother    Heart disease Brother    Allergies Brother    Breast cancer Maternal Aunt    Colon cancer Neg Hx    Esophageal cancer Neg Hx    Rectal cancer Neg Hx    Stomach cancer Neg Hx     No Known Allergies  Current Outpatient Medications on File Prior to Visit  Medication Sig Dispense Refill   albuterol (VENTOLIN HFA) 108 (90 Base) MCG/ACT inhaler INHALE 2 PUFFS INTO THE LUNGS EVERY 6 HOURS AS NEEDED FOR WHEEZING OR SHORTNESS OF BREATH (Patient taking differently: Inhale 2 puffs into the lungs every 6 (six) hours as needed for wheezing or shortness of breath.) 20.1 g 0   atorvastatin (LIPITOR) 10 MG tablet TAKE 1 TABLET(10 MG) BY MOUTH DAILY (Patient taking differently: Take 10 mg by mouth daily.) 30 tablet 4   budesonide-formoterol (SYMBICORT) 160-4.5 MCG/ACT inhaler Inhale 2 puffs into the lungs in the morning and at bedtime. 1 each 12    Continuous Glucose Sensor (FREESTYLE LIBRE 3 SENSOR) MISC USE SENSOR TO CHECK BLOOD GLUCOSE CONTINUOUSLY 3 each 2   fluticasone (FLONASE) 50 MCG/ACT nasal spray Place 2 sprays into both nostrils daily. 16 g 1   ibuprofen (ADVIL) 600 MG tablet Take 1 tablet (600 mg total) by mouth every 8 (eight) hours as needed. (Patient taking differently: Take 600 mg by mouth every 8 (eight) hours as needed for mild pain or moderate pain.) 60 tablet 1   levocetirizine (XYZAL) 5 MG tablet Take 1 tablet (5 mg total) by mouth every evening. 30 tablet 3  Multiple Vitamin (MULTIVITAMIN ADULT PO) Take 2 tablets by mouth daily.     metFORMIN (GLUCOPHAGE) 1000 MG tablet TAKE 1 TABLET(1000 MG) BY MOUTH TWICE DAILY WITH A MEAL (Patient not taking: Reported on 11/09/2022) 30 tablet 2   No current facility-administered medications on file prior to visit.    BP 107/65 (BP Location: Right Arm, Patient Position: Sitting, Cuff Size: Small)   Pulse 78   Temp 98.7 F (37.1 C) (Oral)   Resp 16   Wt 190 lb (86.2 kg)   SpO2 100%   BMI 27.26 kg/m       Objective:   Physical Exam  General Mental Status- Alert. General Appearance- Not in acute distress.   Skin General: Color- Normal Color. Moisture- Normal Moisture.  Neck Carotid Arteries- Normal color. Moisture- Normal Moisture. No carotid bruits. No JVD.  Chest and Lung Exam Auscultation: Breath Sounds:-Normal.  Cardiovascular Auscultation:Rythm- Regular. Murmurs & Other Heart Sounds:Auscultation of the heart reveals- No Murmurs.  Abdomen Inspection:-Inspeection Normal. Palpation/Percussion:Note:No mass. Palpation and Percussion of the abdomen reveal- Non Tender, Non Distended + BS, no rebound or guarding.  Neurologic Cranial Nerve exam:- CN III-XII intact(No nystagmus), symmetric smile. Strength:- 5/5 equal and symmetric strength both upper and lower extremities.       Assessment & Plan:   Patient Instructions  1. Type 2 diabetes mellitus without  complication, without long-term current use of insulin (HCC) -well see how exercise alone and diet did over past 2-3 months. Then determine if need to get back on metformin. - Comp Met (CMET); Future - Hemoglobin A1c; Future  2. Hyperlipidemia associated with type 2 diabetes mellitus (HCC) -continue atorvastatin - Comp Met (CMET); Future - Lipid panel; Future  3. Gastroesophageal reflux disease without esophagitis  -glad to hear still controlled/not present with diet alone.  Flu vaccine today.  Follow up date to be determined after lab review.   Esperanza Richters, PA-C

## 2022-11-09 NOTE — Patient Instructions (Signed)
1. Type 2 diabetes mellitus without complication, without long-term current use of insulin (HCC) -well see how exercise alone and diet did over past 2-3 months. Then determine if need to get back on metformin. - Comp Met (CMET); Future - Hemoglobin A1c; Future  2. Hyperlipidemia associated with type 2 diabetes mellitus (HCC) -continue atorvastatin - Comp Met (CMET); Future - Lipid panel; Future  3. Gastroesophageal reflux disease without esophagitis  -glad to hear still controlled/not present with diet alone.  Flu vaccine today.  Follow up date to be determined after lab review.

## 2022-12-11 DIAGNOSIS — F4323 Adjustment disorder with mixed anxiety and depressed mood: Secondary | ICD-10-CM | POA: Diagnosis not present

## 2023-01-10 ENCOUNTER — Encounter: Payer: Self-pay | Admitting: Family Medicine

## 2023-01-10 ENCOUNTER — Ambulatory Visit (INDEPENDENT_AMBULATORY_CARE_PROVIDER_SITE_OTHER): Payer: BC Managed Care – PPO | Admitting: Family Medicine

## 2023-01-10 VITALS — BP 100/62 | Ht 71.0 in | Wt 183.0 lb

## 2023-01-10 DIAGNOSIS — M222X1 Patellofemoral disorders, right knee: Secondary | ICD-10-CM

## 2023-01-10 DIAGNOSIS — M25561 Pain in right knee: Secondary | ICD-10-CM

## 2023-01-10 NOTE — Progress Notes (Unsigned)
CHIEF COMPLAINT: No chief complaint on file.  _____________________________________________________________ SUBJECTIVE  HPI  Pt is a 42 y.o. male here for evaluation of Right knee pain  Ongoing for 1 week. Around that time increased to 30 minute walks from 10 minute walks. Shoes are a few months old. Bought from [employer]. Thinks the shoe model is fairly similar to his previous shoes  Everything that tugs on it seems to cause pain. When he got into the car and sat down, he felt pain at the top of his knee, and when he leaned out to get the door same pain. Previously used to have sciatic issues but this is not the same.  Pain is primarily located top of the knee, suprapatellar area, a little posterior, and behind the kneecap. Nonradiating. No numbness/tingling. Described as a tearing pain. No pain at rest. Only when he moves a certain way it will hurt. Pain is not keeping him up at night. No catching/locking  Reports hx meniscal tear. Hx laminectomy, pt reports that this resulted in stiffness in his back. Had LLE pain from lumbar radiculopathy. While recovering he also shares that he was compensating for his LLE by putting more weight on his RLE, and thinks he developed some stiffness and hip pain on the R side because of this  Previously seen 05/2022 for right knee pain, suspected impingement, previously with right leg pain that resolved after receiving epidural injection for lumbar radiculopathy.  Was counseled on home exercise therapy, supportive care.  Completed PT for lumbar radiculopathy, bilateral shoulder pain, abnormal posture 08/2022  ------------------------------------------------------------------------------------------------------ Past Medical History:  Diagnosis Date   Allergy    Allergy, unspecified not elsewhere classified    rx w/ OTC antihistamines PRN   Anxiety    Asthma    Atypical chest pain    recent neg eval w/ normal cxr/ekg   Depression    Diverticulosis     DM (diabetes mellitus) (HCC)    Dyspepsia    on protonix 40mg /d   Esophagitis    GERD (gastroesophageal reflux disease)    on protonix 40mg /d   HTN (hypertension)    Hyperlipidemia    on diet alone   IBS (irritable bowel syndrome)    Lumbar back pain    s/p lumbar laminectomy 2007 by DrCabbell   Migraines    Overweight(278.02)    weight Jan10=246#.[name].he was 225# in 9/07...diet and exercise was discussed   Sleep apnea    not wearing c-pap currently    Past Surgical History:  Procedure Laterality Date   DENTAL SURGERY     LUMBAR LAMINECTOMY  01/30/2005   DrCabbell   TEAR DUCT PROBING        Outpatient Encounter Medications as of 01/10/2023  Medication Sig   albuterol (VENTOLIN HFA) 108 (90 Base) MCG/ACT inhaler INHALE 2 PUFFS INTO THE LUNGS EVERY 6 HOURS AS NEEDED FOR WHEEZING OR SHORTNESS OF BREATH (Patient taking differently: Inhale 2 puffs into the lungs every 6 (six) hours as needed for wheezing or shortness of breath.)   atorvastatin (LIPITOR) 10 MG tablet TAKE 1 TABLET(10 MG) BY MOUTH DAILY (Patient taking differently: Take 10 mg by mouth daily.)   budesonide-formoterol (SYMBICORT) 160-4.5 MCG/ACT inhaler Inhale 2 puffs into the lungs in the morning and at bedtime.   Continuous Glucose Sensor (FREESTYLE LIBRE 3 SENSOR) MISC USE SENSOR TO CHECK BLOOD GLUCOSE CONTINUOUSLY   fluticasone (FLONASE) 50 MCG/ACT nasal spray Place 2 sprays into both nostrils daily.   ibuprofen (ADVIL) 600 MG tablet Take  1 tablet (600 mg total) by mouth every 8 (eight) hours as needed. (Patient taking differently: Take 600 mg by mouth every 8 (eight) hours as needed for mild pain or moderate pain.)   levocetirizine (XYZAL) 5 MG tablet Take 1 tablet (5 mg total) by mouth every evening.   metFORMIN (GLUCOPHAGE) 1000 MG tablet TAKE 1 TABLET(1000 MG) BY MOUTH TWICE DAILY WITH A MEAL (Patient not taking: Reported on 11/09/2022)   Multiple Vitamin (MULTIVITAMIN ADULT PO) Take 2 tablets by mouth daily.   No  facility-administered encounter medications on file as of 01/10/2023.    ------------------------------------------------------------------------------------------------------  _____________________________________________________________ OBJECTIVE  PHYSICAL EXAM  Today's Vitals   01/10/23 1428  BP: 100/62  Weight: 183 lb (83 kg)  Height: 5\' 11"  (1.803 m)   Body mass index is 25.52 kg/m.   reviewed  General: A+Ox3, no acute distress, well-nourished, appropriate affect CV: pulses 2+ regular, nondiaphoretic, no peripheral edema, cap refill <2sec Lungs: no audible wheezing, non-labored breathing, bilateral chest rise/fall, nontachypneic Skin: warm, well-perfused, non-icteric, no susp lesions or rashes Neuro: No focal deficits sensation intact, muscle tone wnl, no atrophy Psych: no signs of depression or anxiety MSK:   R Knee: No swelling or deformity Neg fluid wave, joint milking ROM Flex 95 (notably reduced compared to LLE), Ext 0.  Mild crepitance Some discomfort with palpation around patella facets, proximal quad tendon NTTP over the medial fem condyle, lat fem condyle, patient plica, tibial tuberostiy, fibular head, posterior fossa, pes anserine bursa, gerdy's tubercle, medial jt line, lateral jt line Negative patellar apprehension, + patellar grind Neg anterior and posterior drawer Neg lachman Neg sag sign Negative varus stress Negative valgus stress Negative McMurray Negative Thessaly _____________________________________________________________ ASSESSMENT/PLAN Diagnoses and all orders for this visit:  Patellofemoral pain syndrome of right knee -     Ambulatory referral to Physical Therapy  History and exam findings most consistent with PFPS, findings also notable for quad muscle tightness.  Exam findings reviewed with patient today.  Discussed options for management, PT referral placed.  May trial Tylenol, ibuprofen, heat, OTC topical therapies for pain  management.  Discussed that if pain is refractory to conservative management, will consider obtaining films, additional workup as appropriate.  All questions answered. Return precautions discussed. Patient verbalized understanding and is in agreement with plan.  Anticipate follow-up 6-8 weeks as needed, particularly if pain persists, worsens, or if new symptoms arise  Electronically signed by: Burna Forts, MD 01/10/2023 2:42 PM

## 2023-01-17 ENCOUNTER — Other Ambulatory Visit (INDEPENDENT_AMBULATORY_CARE_PROVIDER_SITE_OTHER): Payer: BC Managed Care – PPO

## 2023-01-17 DIAGNOSIS — E1169 Type 2 diabetes mellitus with other specified complication: Secondary | ICD-10-CM | POA: Diagnosis not present

## 2023-01-17 DIAGNOSIS — Z1159 Encounter for screening for other viral diseases: Secondary | ICD-10-CM | POA: Diagnosis not present

## 2023-01-17 DIAGNOSIS — E119 Type 2 diabetes mellitus without complications: Secondary | ICD-10-CM

## 2023-01-17 DIAGNOSIS — E785 Hyperlipidemia, unspecified: Secondary | ICD-10-CM | POA: Diagnosis not present

## 2023-01-17 LAB — LIPID PANEL
Cholesterol: 190 mg/dL (ref 0–200)
HDL: 37.4 mg/dL — ABNORMAL LOW (ref >39.00)
LDL Cholesterol: 131 mg/dL — ABNORMAL HIGH (ref 0–99)
NonHDL: 152.15
Total CHOL/HDL Ratio: 5
Triglycerides: 108 mg/dL (ref 0.0–149.0)
VLDL: 21.6 mg/dL (ref 0.0–40.0)

## 2023-01-17 LAB — COMPREHENSIVE METABOLIC PANEL
ALT: 17 U/L (ref 0–53)
AST: 15 U/L (ref 0–37)
Albumin: 4.6 g/dL (ref 3.5–5.2)
Alkaline Phosphatase: 51 U/L (ref 39–117)
BUN: 18 mg/dL (ref 6–23)
CO2: 34 meq/L — ABNORMAL HIGH (ref 19–32)
Calcium: 9.6 mg/dL (ref 8.4–10.5)
Chloride: 104 meq/L (ref 96–112)
Creatinine, Ser: 0.93 mg/dL (ref 0.40–1.50)
GFR: 101.38 mL/min (ref >60.00)
Glucose, Bld: 80 mg/dL (ref 70–99)
Potassium: 4.2 meq/L (ref 3.5–5.1)
Sodium: 145 meq/L (ref 135–145)
Total Bilirubin: 0.6 mg/dL (ref 0.2–1.2)
Total Protein: 6.9 g/dL (ref 6.0–8.3)

## 2023-01-17 LAB — HEMOGLOBIN A1C: Hgb A1c MFr Bld: 5.6 % (ref 4.6–6.5)

## 2023-01-18 LAB — HEPATITIS C ANTIBODY: Hepatitis C Ab: NONREACTIVE

## 2023-01-29 NOTE — Therapy (Signed)
 OUTPATIENT PHYSICAL THERAPY LOWER EXTREMITY EVALUATION   Patient Name: Anthony Russell MRN: [id number] DOB:May 09, 1980, 42 y.o., male Today's Date: 02/06/2023  END OF SESSION:  PT End of Session - 02/06/23 1409     Visit Number 1    Date for PT Re-Evaluation 03/20/23    Authorization Type [employer]    PT Start Time 1405    PT Stop Time 1448    PT Time Calculation (min) 43 min    Activity Tolerance Patient tolerated treatment well    Behavior During Therapy WFL for tasks assessed/performed             Past Medical History:  Diagnosis Date   Allergy    Allergy, unspecified not elsewhere classified    rx w/ OTC antihistamines PRN   Anxiety    Asthma    Atypical chest pain    recent neg eval w/ normal cxr/ekg   Depression    Diverticulosis    DM (diabetes mellitus) (HCC)    Dyspepsia    on protonix  40mg /d   Esophagitis    GERD (gastroesophageal reflux disease)    on protonix  40mg /d   HTN (hypertension)    Hyperlipidemia    on diet alone   IBS (irritable bowel syndrome)    Lumbar back pain    s/p lumbar laminectomy 2007 by DrCabbell   Migraines    Overweight(278.02)    weight Jan10=246#.SABRA.he was 225# in 9/07...diet and exercise was discussed   Sleep apnea    not wearing c-pap currently   Past Surgical History:  Procedure Laterality Date   DENTAL SURGERY     LUMBAR LAMINECTOMY  01/30/2005   DrCabbell   TEAR DUCT PROBING     Patient Active Problem List   Diagnosis Date Noted   Lumbar radiculopathy 05/10/2022   Capsulitis of left shoulder 05/10/2022   Sinus tachycardia 12/28/2021   Impingement of knee joint, right 12/20/2021   Achilles tendinitis, right leg 12/20/2021   Rotator cuff tendinitis, right 11/04/2021   Encounter for screening for other metabolic disorders 04/07/2021   Dyspnea on exertion 04/07/2021   Migraines 02/23/2021   Lumbar back pain 02/23/2021   HTN (hypertension) 02/23/2021   Dyspepsia 02/23/2021   DM (diabetes mellitus) (HCC) 02/23/2021    Nasal septal deviation 02/17/2021   Laryngitis 01/19/2021   Asymmetric tonsils 12/01/2020   Subacromial bursitis of right shoulder joint 05/17/2020   Hypersomnia 11/03/2015   Obesity 11/03/2015   Allergic rhinitis 04/15/2014   Asthma in adult 04/15/2014   GERD (gastroesophageal reflux disease) 04/15/2014   IBS (irritable bowel syndrome) 04/01/2013   Nausea alone 01/15/2013   Diarrhea 01/15/2013   Depression 10/15/2012   Lesion of eyebrow 02/09/2012   Gastroparesis 07/05/2011   Anxiety 07/05/2010   TESTOSTERONE  DEFICIENCY 05/26/2008   Hyperlipidemia 02/06/2008   Facet arthropathy, lumbar 02/06/2008   Atypical chest pain 02/06/2008    PCP: Dorina Loving, PA-C   REFERRING PROVIDER: Marcene Benedict ORN, MD  REFERRING DIAG: [phone] (ICD-10-CM) - Patellofemoral pain syndrome of right knee   THERAPY DIAG:  Acute pain of right knee  Muscle weakness (generalized)  Other abnormalities of gait and mobility  Abnormal posture  Rationale for Evaluation and Treatment: Rehabilitation  ONSET DATE: 12/31/2022  SUBJECTIVE:   SUBJECTIVE STATEMENT: Pain started about beginning of December.  Was going on a 10 minute walk daily, but when upped it to 30 minute the pain started.   The pain is mostly now with twisting movements.  Started going [employer],  done 2 workouts, with minimal knee pain, not enough to stop.  Hasn't started walking again, mostly because of the cold weather.   PERTINENT HISTORY: Hx R meniscal tear (2020?), hx laminectomy L4-5, DM PAIN:  Are you having pain? Yes: NPRS scale: 0-6/10 Pain location: R knee Pain description: sharp, achey Aggravating factors: certain movements - lifting up to put on shoes/socks, getting into car (twisting of knee) Relieving factors: goes away when stops the movement  PRECAUTIONS: None  RED FLAGS: None   WEIGHT BEARING RESTRICTIONS: No  FALLS:  Has patient fallen in last 6 months? No  LIVING ENVIRONMENT: Lives with: lives  alone Lives in: House/apartment Stairs: Yes: External: 2 flights steps; can reach both Has following equipment at home: None  OCCUPATION:  sport and exercise psychologist, works at home; has sit down and stand up desk   PLOF: Independent and Leisure: rock boxing  PATIENT GOALS: no pain, do normal motion, walk for 30 minutes without pain  NEXT MD VISIT: follow up if needed after PT  OBJECTIVE:  Note: Objective measures were completed at Evaluation unless otherwise noted.  DIAGNOSTIC FINDINGS: no imaging of knee  PATIENT SURVEYS:  LEFS 67/80  COGNITION: Overall cognitive status: Within functional limits for tasks assessed     SENSATION: WFL  EDEMA:  None noted  MUSCLE LENGTH: Hamstrings: significant tightness bil Right ~60 deg; Left ~60 deg Quads - significant tightness bil Hip flexors - significant tightness bil- unable to fully extend hip in prone on R Itbands - significant tightness bil  POSTURE: rounded shoulders, forward head, and increased thoracic kyphosis  PALPATION: Tenderness along R superior patellofemoral tendon  LOWER EXTREMITY ROM:  limited hip internal rotation bil due to tightness   LOWER EXTREMITY MMT:  MMT Right eval Left eval  Hip flexion 5 5  Hip extension 3 3  Hip abduction 5 5  Hip adduction 5 5  Knee flexion 5 4  Knee extension 5p! 5   (Blank rows = not tested)  LOWER EXTREMITY SPECIAL TESTS:  Knee special tests: McMurray's test: negative  GAIT: Distance walked: 200' Assistive device utilized: None Level of assistance: Complete Independence Comments: forward flexed posture, lacking hip extension bilaterally during stance phase.                                                                                                                                TREATMENT DATE:   02/06/2023 EVAL Self Care: Findings, POC, explanation of normal gait mechanics and how his posture and hip flexor tightness is affecting gait resulting in knee/hip pain as  increase distance Therapeutic Exercise: to improve strength and mobility.  Demo, verbal and tactile cues throughout for technique. Hip flexor stretch - trialed both modified thomas stretch and lunge position in chair Hamstring stretch - trialed several stretches - seated, with strap, and demo'd doorway stretch  ItBand stretch - with strap.     PATIENT EDUCATION:  Education details: findings, POC, initial  HEP Person educated: Patient Education method: Explanation, Demonstration, Verbal cues, and Handouts Education comprehension: verbalized understanding and returned demonstration  HOME EXERCISE PROGRAM: Access Code: [email or web address] URL: [email or web address] Date: 02/06/2023 Prepared by: Almarie Sprinkles  Exercises - Modified Thomas Stretch  - 1 x daily - 7 x weekly - 1 sets - 3 reps - 30-45 sec hold - Hip Flexor Stretch with Chair  - 1 x daily - 7 x weekly - 1 sets - 3 reps - 30-45 sec hold - Supine Hamstring Stretch  - 1 x daily - 7 x weekly - 1 sets - 3 reps - 30-45 sec hold - Supine Hamstring Stretch with Strap  - 1 x daily - 7 x weekly - 1 sets - 3 reps - 30-45 sec hold - Supine ITB Stretch with Strap  - 1 x daily - 7 x weekly - 1 sets - 3 reps - 30-45 sec hold - Hip Adductors and Hamstring Stretch with Strap  - 1 x daily - 7 x weekly - 1 sets - 3 reps - 30-45 sec hold - Seated Hamstring Stretch  - 1 x daily - 7 x weekly - 1 sets - 3 reps - 30-45 sec hold - Double Leg Hamstring Stretch at Wall  - 1 x daily - 7 x weekly - 1 sets - 1 reps - 3-5 min hold  ASSESSMENT:  CLINICAL IMPRESSION: Anthony Russell  is a 42 y.o. male who was seen today for physical therapy evaluation and treatment for R knee pain.  [name] demonstrates significant forward flexed posture resulting in decreased hip extension during gait, resulting in hip flexor tightness and decreased glute strength.  In addition, he has significant quad, hamstring, and IT band tightness as well.  While he has  history of R meniscus tear, and reports pain with twisting movements of R knee, McMurray's test was negative for pain or clicking.  He does demonstrate weakness in Left hamstrings secondary to history of L4-5 disc herniation.  Discussed today his pain when he increased walking distance mostly likely due to poor gait mechanics from posture/muscle tightness/poor glute activation and L hamstring weakness resulting in compensation affecting R knee.  He would benefit from skilled physical therapy to address above deficits to decrease R knee/thigh pain and improve gait and activity tolerance.  Today very challenged by his posture, noted significant difficulty with prone positioning.  Given initial HEP focusing on stretching to start in supine, sitting or lunge position.    OBJECTIVE IMPAIRMENTS: Abnormal gait, decreased activity tolerance, difficulty walking, decreased ROM, decreased strength, hypomobility, increased fascial restrictions, increased muscle spasms, impaired flexibility, postural dysfunction, and pain.   ACTIVITY LIMITATIONS: bending, sitting, transfers, dressing, and locomotion level  PARTICIPATION LIMITATIONS: community activity  PERSONAL FACTORS: Past/current experiences and 1-2 comorbidities: Hx R meniscal tear, hx laminectomy L4-5, DM  are also affecting patient's functional outcome.   REHAB POTENTIAL: Good  CLINICAL DECISION MAKING: Evolving/moderate complexity  EVALUATION COMPLEXITY: Moderate   GOALS: Goals reviewed with patient? Yes  SHORT TERM GOALS: Target date: 02/20/2023   Patient will be independent with initial HEP. Baseline: given Goal status: INITIAL   LONG TERM GOALS: Target date: 03/20/2023  Patient will be independent with advanced/ongoing HEP to improve outcomes and carryover.  Baseline:  Goal status: INITIAL  2.  Patient will report at least 75% improvement in R knee pain with activity to improve QOL. Baseline: 5-6/10 sharp pain with twisting  movement Goal status: INITIAL  3.  Patient will demonstrate  improved functional LE strength as demonstrated by 5/5 hip extensor strength. Baseline: 3/5 Goal status: INITIAL  4.  Patient will be able to walk for 30 minutes without increased R knee pain.  Baseline: 10 minutes without pain Goal status: INITIAL  5.  Patient will report 9 points improvement on LEFS to demonstrate improved functional ability. Baseline: 67/80 Goal status: INITIAL   PLAN:  PT FREQUENCY: 1-2x/week  PT DURATION: 6 weeks  PLANNED INTERVENTIONS: 97110-Therapeutic exercises, 97530- Therapeutic activity, [id number]- Neuromuscular re-education, 97535- Self Care, [id number]- Manual therapy, [id number]- Gait training, [id number]- Electrical stimulation (unattended), [phone]- Electrical stimulation (manual), [id number]- Ultrasound, [id number]- Ionotophoresis 4mg /ml Dexamethasone , Patient/Family education, Balance training, Stair training, Taping, Dry Needling, Joint mobilization, Spinal mobilization, Cryotherapy, and Moist heat  PLAN FOR NEXT SESSION: focus on hip mobility and glute strengthening, extremely tight hips and hip flexors, no hip extension during gait.  Lunges, yoga poses?   Almarie JINNY Sprinkles, PT 02/06/2023, 3:56 PM

## 2023-02-06 ENCOUNTER — Other Ambulatory Visit: Payer: Self-pay

## 2023-02-06 ENCOUNTER — Ambulatory Visit: Payer: BC Managed Care – PPO | Attending: Family Medicine | Admitting: Physical Therapy

## 2023-02-06 DIAGNOSIS — M25561 Pain in right knee: Secondary | ICD-10-CM

## 2023-02-06 DIAGNOSIS — M6281 Muscle weakness (generalized): Secondary | ICD-10-CM

## 2023-02-06 DIAGNOSIS — R293 Abnormal posture: Secondary | ICD-10-CM | POA: Diagnosis not present

## 2023-02-06 DIAGNOSIS — M222X1 Patellofemoral disorders, right knee: Secondary | ICD-10-CM | POA: Insufficient documentation

## 2023-02-06 DIAGNOSIS — R2689 Other abnormalities of gait and mobility: Secondary | ICD-10-CM | POA: Diagnosis not present

## 2023-02-13 ENCOUNTER — Ambulatory Visit: Payer: BC Managed Care – PPO

## 2023-02-13 DIAGNOSIS — M25561 Pain in right knee: Secondary | ICD-10-CM

## 2023-02-13 DIAGNOSIS — M6281 Muscle weakness (generalized): Secondary | ICD-10-CM

## 2023-02-13 DIAGNOSIS — R293 Abnormal posture: Secondary | ICD-10-CM

## 2023-02-13 DIAGNOSIS — R2689 Other abnormalities of gait and mobility: Secondary | ICD-10-CM | POA: Diagnosis not present

## 2023-02-13 DIAGNOSIS — M222X1 Patellofemoral disorders, right knee: Secondary | ICD-10-CM | POA: Diagnosis not present

## 2023-02-13 NOTE — Therapy (Signed)
 OUTPATIENT PHYSICAL THERAPY TREATMENT   Patient Name: Anthony Russell MRN: [id number] DOB:Feb 09, 1980, 43 y.o., male Today's Date: 02/13/2023  END OF SESSION:  PT End of Session - 02/13/23 1422     Visit Number 2    Date for PT Re-Evaluation 03/20/23    Authorization Type BCBS    PT Start Time 1404    PT Stop Time 1443    PT Time Calculation (min) 39 min    Activity Tolerance Patient tolerated treatment well    Behavior During Therapy WFL for tasks assessed/performed              Past Medical History:  Diagnosis Date   Allergy    Allergy, unspecified not elsewhere classified    rx w/ OTC antihistamines PRN   Anxiety    Asthma    Atypical chest pain    recent neg eval w/ normal cxr/ekg   Depression    Diverticulosis    DM (diabetes mellitus) (HCC)    Dyspepsia    on protonix  40mg /d   Esophagitis    GERD (gastroesophageal reflux disease)    on protonix  40mg /d   HTN (hypertension)    Hyperlipidemia    on diet alone   IBS (irritable bowel syndrome)    Lumbar back pain    s/p lumbar laminectomy 2007 by DrCabbell   Migraines    Overweight(278.02)    weight Jan10=246#.SABRA.he was 225# in 9/07...diet and exercise was discussed   Sleep apnea    not wearing c-pap currently   Past Surgical History:  Procedure Laterality Date   DENTAL SURGERY     LUMBAR LAMINECTOMY  01/30/2005   DrCabbell   TEAR DUCT PROBING     Patient Active Problem List   Diagnosis Date Noted   Lumbar radiculopathy 05/10/2022   Capsulitis of left shoulder 05/10/2022   Sinus tachycardia 12/28/2021   Impingement of knee joint, right 12/20/2021   Achilles tendinitis, right leg 12/20/2021   Rotator cuff tendinitis, right 11/04/2021   Encounter for screening for other metabolic disorders 04/07/2021   Dyspnea on exertion 04/07/2021   Migraines 02/23/2021   Lumbar back pain 02/23/2021   HTN (hypertension) 02/23/2021   Dyspepsia 02/23/2021   DM (diabetes mellitus) (HCC) 02/23/2021   Nasal septal  deviation 02/17/2021   Laryngitis 01/19/2021   Asymmetric tonsils 12/01/2020   Subacromial bursitis of right shoulder joint 05/17/2020   Hypersomnia 11/03/2015   Obesity 11/03/2015   Allergic rhinitis 04/15/2014   Asthma in adult 04/15/2014   GERD (gastroesophageal reflux disease) 04/15/2014   IBS (irritable bowel syndrome) 04/01/2013   Nausea alone 01/15/2013   Diarrhea 01/15/2013   Depression 10/15/2012   Lesion of eyebrow 02/09/2012   Gastroparesis 07/05/2011   Anxiety 07/05/2010   TESTOSTERONE  DEFICIENCY 05/26/2008   Hyperlipidemia 02/06/2008   Facet arthropathy, lumbar 02/06/2008   Atypical chest pain 02/06/2008    PCP: Dorina Loving, PA-C   REFERRING PROVIDER: Dorina Loving, PA-C  REFERRING DIAG: F77.7K8 (ICD-10-CM) - Patellofemoral pain syndrome of right knee   THERAPY DIAG:  Acute pain of right knee  Muscle weakness (generalized)  Other abnormalities of gait and mobility  Abnormal posture  Rationale for Evaluation and Treatment: Rehabilitation  ONSET DATE: 12/31/2022  SUBJECTIVE:   SUBJECTIVE STATEMENT: R knee pain not bad while walking, worse with bending it and crossing leg.  PERTINENT HISTORY: Hx R meniscal tear (2020?), hx laminectomy L4-5, DM PAIN:  Are you having pain? Yes: NPRS scale: 6 when crossing leg or bending/10 Pain location:  R knee Pain description: sharp, achey Aggravating factors: certain movements - lifting up to put on shoes/socks, getting into car (twisting of knee) Relieving factors: goes away when stops the movement  PRECAUTIONS: None  RED FLAGS: None   WEIGHT BEARING RESTRICTIONS: No  FALLS:  Has patient fallen in last 6 months? No  LIVING ENVIRONMENT: Lives with: lives alone Lives in: House/apartment Stairs: Yes: External: 2 flights steps; can reach both Has following equipment at home: None  OCCUPATION:  sport and exercise psychologist, works at home; has sit down and stand up desk   PLOF: Independent and Leisure: rock  boxing  PATIENT GOALS: no pain, do normal motion, walk for 30 minutes without pain  NEXT MD VISIT: follow up if needed after PT  OBJECTIVE:  Note: Objective measures were completed at Evaluation unless otherwise noted.  DIAGNOSTIC FINDINGS: no imaging of knee  PATIENT SURVEYS:  LEFS 67/80  COGNITION: Overall cognitive status: Within functional limits for tasks assessed     SENSATION: WFL  EDEMA:  None noted  MUSCLE LENGTH: Hamstrings: significant tightness bil Right ~60 deg; Left ~60 deg Quads - significant tightness bil Hip flexors - significant tightness bil- unable to fully extend hip in prone on R Itbands - significant tightness bil  POSTURE: rounded shoulders, forward head, and increased thoracic kyphosis  PALPATION: Tenderness along R superior patellofemoral tendon  LOWER EXTREMITY ROM:  limited hip internal rotation bil due to tightness   LOWER EXTREMITY MMT:  MMT Right eval Left eval  Hip flexion 5 5  Hip extension 3 3  Hip abduction 5 5  Hip adduction 5 5  Knee flexion 5 4  Knee extension 5p! 5   (Blank rows = not tested)  LOWER EXTREMITY SPECIAL TESTS:  Knee special tests: McMurray's test: negative  GAIT: Distance walked: 200' Assistive device utilized: None Level of assistance: Complete Independence Comments: forward flexed posture, lacking hip extension bilaterally during stance phase.                                                                                                                                TREATMENT DATE:  02/13/23 Therapeutic Exercise: to improve strength and mobility.  Demo, verbal and tactile cues throughout for technique. Bike L2x89min Supine hip ER/IR 10x5 Supine clam 10x5 B R thomas stretch 1 min hold x 2 Wall slide staggered stance for hip and trunk extension x 10 B Fwd step opp arm raise x 10 B- for hip mobility R SLR with QS 2 x 10  R prone hip extension x 10  R prone HS curls 2x10 2#  02/06/2023  EVAL Self Care: Findings, POC, explanation of normal gait mechanics and how his posture and hip flexor tightness is affecting gait resulting in knee/hip pain as increase distance Therapeutic Exercise: to improve strength and mobility.  Demo, verbal and tactile cues throughout for technique. Hip flexor stretch - trialed both modified thomas stretch and lunge position in chair  Hamstring stretch - trialed several stretches - seated, with strap, and demo'd doorway stretch  ItBand stretch - with strap.     PATIENT EDUCATION:  Education details: findings, POC, initial HEP Person educated: Patient Education method: Explanation, Demonstration, Verbal cues, and Handouts Education comprehension: verbalized understanding and returned demonstration  HOME EXERCISE PROGRAM: Access Code: [email or web address] URL: [email or web address] Date: 02/06/2023 Prepared by: Almarie Sprinkles  Exercises - Modified Thomas Stretch  - 1 x daily - 7 x weekly - 1 sets - 3 reps - 30-45 sec hold - Hip Flexor Stretch with Chair  - 1 x daily - 7 x weekly - 1 sets - 3 reps - 30-45 sec hold - Supine Hamstring Stretch  - 1 x daily - 7 x weekly - 1 sets - 3 reps - 30-45 sec hold - Supine Hamstring Stretch with Strap  - 1 x daily - 7 x weekly - 1 sets - 3 reps - 30-45 sec hold - Supine ITB Stretch with Strap  - 1 x daily - 7 x weekly - 1 sets - 3 reps - 30-45 sec hold - Hip Adductors and Hamstring Stretch with Strap  - 1 x daily - 7 x weekly - 1 sets - 3 reps - 30-45 sec hold - Seated Hamstring Stretch  - 1 x daily - 7 x weekly - 1 sets - 3 reps - 30-45 sec hold - Double Leg Hamstring Stretch at Wall  - 1 x daily - 7 x weekly - 1 sets - 1 reps - 3-5 min hold  ASSESSMENT:  CLINICAL IMPRESSION: Pt presents with decreased B hip mobility along with mild R knee pain with flexion. He had no reports of increased pain throughout the session. He definitely needs more work on hip mobility and strengthening his gluteal muscles to  decrease his pain.   OBJECTIVE IMPAIRMENTS: Abnormal gait, decreased activity tolerance, difficulty walking, decreased ROM, decreased strength, hypomobility, increased fascial restrictions, increased muscle spasms, impaired flexibility, postural dysfunction, and pain.   ACTIVITY LIMITATIONS: bending, sitting, transfers, dressing, and locomotion level  PARTICIPATION LIMITATIONS: community activity  PERSONAL FACTORS: Past/current experiences and 1-2 comorbidities: Hx R meniscal tear, hx laminectomy L4-5, DM  are also affecting patient's functional outcome.   REHAB POTENTIAL: Good  CLINICAL DECISION MAKING: Evolving/moderate complexity  EVALUATION COMPLEXITY: Moderate   GOALS: Goals reviewed with patient? Yes  SHORT TERM GOALS: Target date: 02/20/2023   Patient will be independent with initial HEP. Baseline: given Goal status: IN PROGRESS   LONG TERM GOALS: Target date: 03/20/2023  Patient will be independent with advanced/ongoing HEP to improve outcomes and carryover.  Baseline:  Goal status: IN PROGRESS  2.  Patient will report at least 75% improvement in R knee pain with activity to improve QOL. Baseline: 5-6/10 sharp pain with twisting movement Goal status: IN PROGRESS  3.  Patient will demonstrate improved functional LE strength as demonstrated by 5/5 hip extensor strength. Baseline: 3/5 Goal status: IN PROGRESS  4.  Patient will be able to walk for 30 minutes without increased R knee pain.  Baseline: 10 minutes without pain Goal status: IN PROGRESS  5.  Patient will report 9 points improvement on LEFS to demonstrate improved functional ability. Baseline: 67/80 Goal status: IN PROGRESS   PLAN:  PT FREQUENCY: 1-2x/week  PT DURATION: 6 weeks  PLANNED INTERVENTIONS: 97110-Therapeutic exercises, 97530- Therapeutic activity, [id number]- Neuromuscular re-education, 97535- Self Care, [id number]- Manual therapy, [id number]- Gait training, [id number]- Electrical stimulation (unattended),  [id number]- Electrical stimulation (manual), [id number]- Ultrasound, [id number]- Ionotophoresis  4mg /ml Dexamethasone , Patient/Family education, Balance training, Stair training, Taping, Dry Needling, Joint mobilization, Spinal mobilization, Cryotherapy, and Moist heat  PLAN FOR NEXT SESSION: focus on hip mobility and glute strengthening, extremely tight hips and hip flexors, no hip extension during gait.  Lunges, yoga poses?   Kashish Yglesias L Munira Polson, PTA 02/13/2023, 2:46 PM

## 2023-02-15 ENCOUNTER — Encounter: Payer: Self-pay | Admitting: Physical Therapy

## 2023-02-15 ENCOUNTER — Ambulatory Visit: Payer: BC Managed Care – PPO | Admitting: Physical Therapy

## 2023-02-15 DIAGNOSIS — M6281 Muscle weakness (generalized): Secondary | ICD-10-CM

## 2023-02-15 DIAGNOSIS — R293 Abnormal posture: Secondary | ICD-10-CM

## 2023-02-15 DIAGNOSIS — M222X1 Patellofemoral disorders, right knee: Secondary | ICD-10-CM | POA: Diagnosis not present

## 2023-02-15 DIAGNOSIS — R2689 Other abnormalities of gait and mobility: Secondary | ICD-10-CM

## 2023-02-15 DIAGNOSIS — M25561 Pain in right knee: Secondary | ICD-10-CM

## 2023-02-15 NOTE — Therapy (Signed)
OUTPATIENT PHYSICAL THERAPY TREATMENT   Patient Name: Anthony Russell MRN: [id number] DOB:[birth year], 43 y.o., male Today's Date: 02/15/2023  END OF SESSION:  PT End of Session - 02/15/23 1409     Visit Number 3    Date for PT Re-Evaluation 03/20/23    Authorization Type BCBS    PT Start Time 1408    PT Stop Time 1448    PT Time Calculation (min) 40 min    Activity Tolerance Patient tolerated treatment well    Behavior During Therapy WFL for tasks assessed/performed              Past Medical History:  Diagnosis Date   Allergy    Allergy, unspecified not elsewhere classified    rx w/ OTC antihistamines PRN   Anxiety    Asthma    Atypical chest pain    recent neg eval w/ normal cxr/ekg   Depression    Diverticulosis    DM (diabetes mellitus) (HCC)    Dyspepsia    on protonix 40mg /d   Esophagitis    GERD (gastroesophageal reflux disease)    on protonix 40mg /d   HTN (hypertension)    Hyperlipidemia    on diet alone   IBS (irritable bowel syndrome)    Lumbar back pain    s/p lumbar laminectomy 2007 by DrCabbell   Migraines    Overweight(278.02)    weight Jan10=246#.[name].he was 225# in 9/07...diet and exercise was discussed   Sleep apnea    not wearing c-pap currently   Past Surgical History:  Procedure Laterality Date   DENTAL SURGERY     LUMBAR LAMINECTOMY  01/30/2005   DrCabbell   TEAR DUCT PROBING     Patient Active Problem List   Diagnosis Date Noted   Lumbar radiculopathy 05/10/2022   Capsulitis of left shoulder 05/10/2022   Sinus tachycardia 12/28/2021   Impingement of knee joint, right 12/20/2021   Achilles tendinitis, right leg 12/20/2021   Rotator cuff tendinitis, right 11/04/2021   Encounter for screening for other metabolic disorders 04/07/2021   Dyspnea on exertion 04/07/2021   Migraines 02/23/2021   Lumbar back pain 02/23/2021   HTN (hypertension) 02/23/2021   Dyspepsia 02/23/2021   DM (diabetes mellitus) (HCC) 02/23/2021   Nasal septal  deviation 02/17/2021   Laryngitis 01/19/2021   Asymmetric tonsils 12/01/2020   Subacromial bursitis of right shoulder joint 05/17/2020   Hypersomnia 11/03/2015   Obesity 11/03/2015   Allergic rhinitis 04/15/2014   Asthma in adult 04/15/2014   GERD (gastroesophageal reflux disease) 04/15/2014   IBS (irritable bowel syndrome) 04/01/2013   Nausea alone 01/15/2013   Diarrhea 01/15/2013   Depression 10/15/2012   Lesion of eyebrow 02/09/2012   Gastroparesis 07/05/2011   Anxiety 07/05/2010   TESTOSTERONE DEFICIENCY 05/26/2008   Hyperlipidemia 02/06/2008   Facet arthropathy, lumbar 02/06/2008   Atypical chest pain 02/06/2008    PCP: Esperanza Richters, PA-C   REFERRING PROVIDER: Burna Forts, MD  REFERRING DIAG: [id number] (ICD-10-CM) - Patellofemoral pain syndrome of right knee   THERAPY DIAG:  Acute pain of right knee  Muscle weakness (generalized)  Other abnormalities of gait and mobility  Abnormal posture  Rationale for Evaluation and Treatment: Rehabilitation  ONSET DATE: 12/31/2022  SUBJECTIVE:   SUBJECTIVE STATEMENT: Went to kick boxing last week, knee really hurt afterwards, but then went this Tuesday and it wasn't bad afterwards.   Likes the wall stretch for hamstrings, and modified Thomas stretch for hip flexors.    PERTINENT HISTORY: Hx R  meniscal tear (2020?), hx laminectomy L4-5, DM PAIN:  Are you having pain? Yes: NPRS scale: 6 when crossing leg or bending/10 Pain location: R knee Pain description: sharp, achey Aggravating factors: certain movements - lifting up to put on shoes/socks, getting into car (twisting of knee) Relieving factors: goes away when stops the movement  PRECAUTIONS: None  RED FLAGS: None   WEIGHT BEARING RESTRICTIONS: No  FALLS:  Has patient fallen in last 6 months? No  LIVING ENVIRONMENT: Lives with: lives alone Lives in: House/apartment Stairs: Yes: External: 2 flights steps; can reach both Has following equipment at home:  None  OCCUPATION:  Sport and exercise psychologist, works at home; has sit down and stand up desk   PLOF: Independent and Leisure: rock boxing  PATIENT GOALS: no pain, do normal motion, walk for 30 minutes without pain  NEXT MD VISIT: follow up if needed after PT  OBJECTIVE:  Note: Objective measures were completed at Evaluation unless otherwise noted.  DIAGNOSTIC FINDINGS: no imaging of knee  PATIENT SURVEYS:  LEFS 67/80  COGNITION: Overall cognitive status: Within functional limits for tasks assessed     SENSATION: WFL  EDEMA:  None noted  MUSCLE LENGTH: Hamstrings: significant tightness bil Right ~60 deg; Left ~60 deg Quads - significant tightness bil Hip flexors - significant tightness bil- unable to fully extend hip in prone on R Itbands - significant tightness bil  POSTURE: rounded shoulders, forward head, and increased thoracic kyphosis  PALPATION: Tenderness along R superior patellofemoral tendon  LOWER EXTREMITY ROM:  limited hip internal rotation bil due to tightness   LOWER EXTREMITY MMT:  MMT Right eval Left eval  Hip flexion 5 5  Hip extension 3 3  Hip abduction 5 5  Hip adduction 5 5  Knee flexion 5 4  Knee extension 5p! 5   (Blank rows = not tested)  LOWER EXTREMITY SPECIAL TESTS:  Knee special tests: McMurray's test: negative  GAIT: Distance walked: 200' Assistive device utilized: None Level of assistance: Complete Independence Comments: forward flexed posture, lacking hip extension bilaterally during stance phase.                                                                                                                                TREATMENT DATE:  02/15/2023 Therapeutic Exercise: to improve strength and mobility.  Demo, verbal and tactile cues throughout for technique. Bike L2 x 6 min -reported knee pain Star excursion - side, back and cross step Lunges x 10 R/L - challenging.  Single leg RDLs x 10 R/L Modified thomas stretch for hip  flexors x 1 min R/L Prone quad stretch with strap - R pre and post manual therapy Manual Therapy: to decrease muscle spasm and pain and improve mobility Quad stretch in prone with progressive stretch using contract relax, STM/TPR to R patellar tendon/distal rectus femoris, skilled palpation and monitoring during dry needling. Trigger Point Dry Needling  Subsequent Treatment: Instructions provided previously at initial  dry needling treatment.   Patient Verbal Consent Given: Yes Education Handout Provided: Yes updated copy given, reviewed how to decrease soreness following TrDN Muscles Treated: distal rectus femoris Electrical Stimulation Performed: No Treatment Response/Outcome: Palpable Increase in Muscle Length    02/13/23 Therapeutic Exercise: to improve strength and mobility.  Demo, verbal and tactile cues throughout for technique. Bike L2x46min Supine hip ER/IR 10x5" Supine clam 10x5" B R thomas stretch 1 min hold x 2 Wall slide staggered stance for hip and trunk extension x 10 B Fwd step opp arm raise x 10 B- for hip mobility R SLR with QS 2 x 10  R prone hip extension x 10  R prone HS curls 2x10 2#  02/06/2023 EVAL Self Care: Findings, POC, explanation of normal gait mechanics and how his posture and hip flexor tightness is affecting gait resulting in knee/hip pain as increase distance Therapeutic Exercise: to improve strength and mobility.  Demo, verbal and tactile cues throughout for technique. Hip flexor stretch - trialed both modified thomas stretch and lunge position in chair Hamstring stretch - trialed several stretches - seated, with strap, and demo'd doorway stretch  ItBand stretch - with strap.     PATIENT EDUCATION:  Education details: HEP update Person educated: Patient Education method: Explanation, Demonstration, Verbal cues, and Handouts Education comprehension: verbalized understanding and returned demonstration  HOME EXERCISE PROGRAM: Access Code:  [email or web address] URL: [email or web address] Date: 02/15/2023 Prepared by: Harrie Foreman  Exercises - Modified Thomas Stretch  - 1 x daily - 7 x weekly - 1 sets - 3 reps - 30-45 sec hold - Hip Flexor Stretch with Chair  - 1 x daily - 7 x weekly - 1 sets - 3 reps - 30-45 sec hold - Supine Hamstring Stretch  - 1 x daily - 7 x weekly - 1 sets - 3 reps - 30-45 sec hold - Supine Hamstring Stretch with Strap  - 1 x daily - 7 x weekly - 1 sets - 3 reps - 30-45 sec hold - Supine ITB Stretch with Strap  - 1 x daily - 7 x weekly - 1 sets - 3 reps - 30-45 sec hold - Hip Adductors and Hamstring Stretch with Strap  - 1 x daily - 7 x weekly - 1 sets - 3 reps - 30-45 sec hold - Seated Hamstring Stretch  - 1 x daily - 7 x weekly - 1 sets - 3 reps - 30-45 sec hold - Double Leg Hamstring Stretch at Wall  - 1 x daily - 7 x weekly - 1 sets - 1 reps - 3-5 min hold - Single Leg Balance with Clock Reach  - 1 x daily - 7 x weekly - 1 sets - 10 reps - Forward T with Counter Support  - 1 x daily - 7 x weekly - 1 sets - 10 reps - Prone Quadriceps Stretch with Strap  - 1 x daily - 7 x weekly - 1 sets - 3 reps - 30 sec hold  ASSESSMENT:  CLINICAL IMPRESSION: Anthony Russell reports no significant change in R knee pain, still hurts when bends knee.  Independent with HEP, has been performing met STG #1.  Continued to work on hip mobility, gluteal strengthening, and hip flexor, hamstring and quad tightness.  Noted today very challenged with lunges and single leg activities. Also performed TrDN to R rectus femoris, reported decreased tightness and improved tolerance to stretch following.  Pt educated to expect mild to moderate muscle soreness for up  to 24-48 hrs and instructed to continue prescribed home exercise program and current activity level with pt verbalizing understanding of theses instructions.  HEP was updated.  Anthony Russell continues to demonstrate potential for improvement and would benefit from continued  skilled therapy to address impairments.      OBJECTIVE IMPAIRMENTS: Abnormal gait, decreased activity tolerance, difficulty walking, decreased ROM, decreased strength, hypomobility, increased fascial restrictions, increased muscle spasms, impaired flexibility, postural dysfunction, and pain.   ACTIVITY LIMITATIONS: bending, sitting, transfers, dressing, and locomotion level  PARTICIPATION LIMITATIONS: community activity  PERSONAL FACTORS: Past/current experiences and 1-2 comorbidities: Hx R meniscal tear, hx laminectomy L4-5, DM  are also affecting patient's functional outcome.   REHAB POTENTIAL: Good  CLINICAL DECISION MAKING: Evolving/moderate complexity  EVALUATION COMPLEXITY: Moderate   GOALS: Goals reviewed with patient? Yes  SHORT TERM GOALS: Target date: 02/20/2023   Patient will be independent with initial HEP. Baseline: given Goal status: MET 02/14/22   LONG TERM GOALS: Target date: 03/20/2023  Patient will be independent with advanced/ongoing HEP to improve outcomes and carryover.  Baseline:  Goal status: IN PROGRESS  2.  Patient will report at least 75% improvement in R knee pain with activity to improve QOL. Baseline: 5-6/10 sharp pain with twisting movement Goal status: IN PROGRESS  3.  Patient will demonstrate improved functional LE strength as demonstrated by 5/5 hip extensor strength. Baseline: 3/5 Goal status: IN PROGRESS  4.  Patient will be able to walk for 30 minutes without increased R knee pain.  Baseline: 10 minutes without pain Goal status: IN PROGRESS  5.  Patient will report 9 points improvement on LEFS to demonstrate improved functional ability. Baseline: 67/80 Goal status: IN PROGRESS   PLAN:  PT FREQUENCY: 1-2x/week  PT DURATION: 6 weeks  PLANNED INTERVENTIONS: 97110-Therapeutic exercises, 97530- Therapeutic activity, [id number]- Neuromuscular re-education, 97535- Self Care, 52841- Manual therapy, [id number]- Gait training, 97014- Electrical  stimulation (unattended), [phone]- Electrical stimulation (manual), [id number]- Ultrasound, [id number]- Ionotophoresis 4mg /ml Dexamethasone, Patient/Family education, Balance training, Stair training, Taping, Dry Needling, Joint mobilization, Spinal mobilization, Cryotherapy, and Moist heat  PLAN FOR NEXT SESSION: focus on hip mobility and glute strengthening, extremely tight hips and hip flexors, no hip extension during gait.  Lunges, yoga poses?   Jena Gauss, PT 02/15/2023, 2:59 PM

## 2023-02-20 ENCOUNTER — Ambulatory Visit: Payer: BC Managed Care – PPO | Admitting: Physical Therapy

## 2023-02-22 ENCOUNTER — Ambulatory Visit: Payer: BC Managed Care – PPO | Admitting: Physical Therapy

## 2023-02-22 ENCOUNTER — Encounter: Payer: Self-pay | Admitting: Physical Therapy

## 2023-02-22 DIAGNOSIS — R2689 Other abnormalities of gait and mobility: Secondary | ICD-10-CM

## 2023-02-22 DIAGNOSIS — M25561 Pain in right knee: Secondary | ICD-10-CM | POA: Diagnosis not present

## 2023-02-22 DIAGNOSIS — M6281 Muscle weakness (generalized): Secondary | ICD-10-CM

## 2023-02-22 DIAGNOSIS — M222X1 Patellofemoral disorders, right knee: Secondary | ICD-10-CM | POA: Diagnosis not present

## 2023-02-22 DIAGNOSIS — R293 Abnormal posture: Secondary | ICD-10-CM | POA: Diagnosis not present

## 2023-02-22 NOTE — Therapy (Signed)
OUTPATIENT PHYSICAL THERAPY TREATMENT   Patient Name: ALDIS LAMORTE MRN: [id number] DOB:04-05-1980, 43 y.o., male Today's Date: 02/22/2023  END OF SESSION:  PT End of Session - 02/22/23 1406     Visit Number 4    Date for PT Re-Evaluation 03/20/23    Authorization Type BCBS    PT Start Time 1406    PT Stop Time 1453    PT Time Calculation (min) 47 min    Activity Tolerance Patient tolerated treatment well    Behavior During Therapy WFL for tasks assessed/performed              Past Medical History:  Diagnosis Date   Allergy    Allergy, unspecified not elsewhere classified    rx w/ OTC antihistamines PRN   Anxiety    Asthma    Atypical chest pain    recent neg eval w/ normal cxr/ekg   Depression    Diverticulosis    DM (diabetes mellitus) (HCC)    Dyspepsia    on protonix 40mg /d   Esophagitis    GERD (gastroesophageal reflux disease)    on protonix 40mg /d   HTN (hypertension)    Hyperlipidemia    on diet alone   IBS (irritable bowel syndrome)    Lumbar back pain    s/p lumbar laminectomy 2007 by DrCabbell   Migraines    Overweight(278.02)    weight Jan10=246#.[name].he was 225# in 9/07...diet and exercise was discussed   Sleep apnea    not wearing c-pap currently   Past Surgical History:  Procedure Laterality Date   DENTAL SURGERY     LUMBAR LAMINECTOMY  01/30/2005   DrCabbell   TEAR DUCT PROBING     Patient Active Problem List   Diagnosis Date Noted   Lumbar radiculopathy 05/10/2022   Capsulitis of left shoulder 05/10/2022   Sinus tachycardia 12/28/2021   Impingement of knee joint, right 12/20/2021   Achilles tendinitis, right leg 12/20/2021   Rotator cuff tendinitis, right 11/04/2021   Encounter for screening for other metabolic disorders 04/07/2021   Dyspnea on exertion 04/07/2021   Migraines 02/23/2021   Lumbar back pain 02/23/2021   HTN (hypertension) 02/23/2021   Dyspepsia 02/23/2021   DM (diabetes mellitus) (HCC) 02/23/2021   Nasal septal  deviation 02/17/2021   Laryngitis 01/19/2021   Asymmetric tonsils 12/01/2020   Subacromial bursitis of right shoulder joint 05/17/2020   Hypersomnia 11/03/2015   Obesity 11/03/2015   Allergic rhinitis 04/15/2014   Asthma in adult 04/15/2014   GERD (gastroesophageal reflux disease) 04/15/2014   IBS (irritable bowel syndrome) 04/01/2013   Nausea alone 01/15/2013   Diarrhea 01/15/2013   Depression 10/15/2012   Lesion of eyebrow 02/09/2012   Gastroparesis 07/05/2011   Anxiety 07/05/2010   TESTOSTERONE DEFICIENCY 05/26/2008   Hyperlipidemia 02/06/2008   Facet arthropathy, lumbar 02/06/2008   Atypical chest pain 02/06/2008    PCP: Esperanza Richters, PA-C   REFERRING PROVIDER: Burna Forts, MD  REFERRING DIAG: [phone] (ICD-10-CM) - Patellofemoral pain syndrome of right knee   THERAPY DIAG:  Acute pain of right knee  Muscle weakness (generalized)  Other abnormalities of gait and mobility  Rationale for Evaluation and Treatment: Rehabilitation  ONSET DATE: 12/31/2022  SUBJECTIVE:   SUBJECTIVE STATEMENT: Pain occasionally when moves wrong.  No pain in R knee right now, just the other knee.      PERTINENT HISTORY: Hx R meniscal tear (2020?), hx laminectomy L4-5, DM PAIN:  Are you having pain? Yes: NPRS scale: 0 currently, 6  when crossing leg or bending/10 Pain location: R knee Pain description: sharp, achey Aggravating factors: certain movements - lifting up to put on shoes/socks, getting into car (twisting of knee) Relieving factors: goes away when stops the movement  PRECAUTIONS: None  RED FLAGS: None   WEIGHT BEARING RESTRICTIONS: No  FALLS:  Has patient fallen in last 6 months? No  LIVING ENVIRONMENT: Lives with: lives alone Lives in: House/apartment Stairs: Yes: External: 2 flights steps; can reach both Has following equipment at home: None  OCCUPATION:  Sport and exercise psychologist, works at home; has sit down and stand up desk   PLOF: Independent and Leisure: rock  boxing  PATIENT GOALS: no pain, do normal motion, walk for 30 minutes without pain  NEXT MD VISIT: follow up if needed after PT  OBJECTIVE:  Note: Objective measures were completed at Evaluation unless otherwise noted.  DIAGNOSTIC FINDINGS: no imaging of knee  PATIENT SURVEYS:  LEFS 67/80  COGNITION: Overall cognitive status: Within functional limits for tasks assessed     SENSATION: WFL  EDEMA:  None noted  MUSCLE LENGTH: Hamstrings: significant tightness bil Right ~60 deg; Left ~60 deg Quads - significant tightness bil Hip flexors - significant tightness bil- unable to fully extend hip in prone on R Itbands - significant tightness bil  POSTURE: rounded shoulders, forward head, and increased thoracic kyphosis  PALPATION: Tenderness along R superior patellofemoral tendon  LOWER EXTREMITY ROM:  limited hip internal rotation bil due to tightness   LOWER EXTREMITY MMT:  MMT Right eval Left eval  Hip flexion 5 5  Hip extension 3 3  Hip abduction 5 5  Hip adduction 5 5  Knee flexion 5 4  Knee extension 5p! 5   (Blank rows = not tested)  LOWER EXTREMITY SPECIAL TESTS:  Knee special tests: McMurray's test: negative  GAIT: Distance walked: 200' Assistive device utilized: None Level of assistance: Complete Independence Comments: forward flexed posture, lacking hip extension bilaterally during stance phase.                                                                                                                                TREATMENT DATE:   02/22/23 Therapeutic Exercise: to improve strength and mobility.  Demo, verbal and tactile cues throughout for technique. Elliptical L1 x 4 min At counter for safety with 1 UE support Sliding lunges to side x 10 R/L  Sliding forward lunge x 10 R/L Siding back lunge x 10 R/L  Single leg RDL x 10 R/L  Elevated ATG split squat knee stretch  Squats x 10 Squats x 10 with elevated heels - easier Hip flexor stretch in  supine x 1 min  Prone quad stretch 2 x 1 min each Supine thoracic mob on foam roller - back stroke x 10 HEP update   02/15/2023 Therapeutic Exercise: to improve strength and mobility.  Demo, verbal and tactile cues throughout for technique. Bike L2 x 6 min -reported  knee pain Star excursion - side, back and cross step Lunges x 10 R/L - challenging.  Single leg RDLs x 10 R/L Modified thomas stretch for hip flexors x 1 min R/L Prone quad stretch with strap - R pre and post manual therapy Manual Therapy: to decrease muscle spasm and pain and improve mobility Quad stretch in prone with progressive stretch using contract relax, STM/TPR to R patellar tendon/distal rectus femoris, skilled palpation and monitoring during dry needling. Trigger Point Dry Needling  Subsequent Treatment: Instructions provided previously at initial dry needling treatment.   Patient Verbal Consent Given: Yes Education Handout Provided: Yes updated copy given, reviewed how to decrease soreness following TrDN Muscles Treated: distal rectus femoris Electrical Stimulation Performed: No Treatment Response/Outcome: Palpable Increase in Muscle Length    02/13/23 Therapeutic Exercise: to improve strength and mobility.  Demo, verbal and tactile cues throughout for technique. Bike L2x52min Supine hip ER/IR 10x5" Supine clam 10x5" B R thomas stretch 1 min hold x 2 Wall slide staggered stance for hip and trunk extension x 10 B Fwd step opp arm raise x 10 B- for hip mobility R SLR with QS 2 x 10  R prone hip extension x 10  R prone HS curls 2x10 2#   PATIENT EDUCATION:  Education details: HEP update Person educated: Patient Education method: Explanation, Demonstration, Verbal cues, and Handouts Education comprehension: verbalized understanding and returned demonstration  HOME EXERCISE PROGRAM: Access Code: [email or web address] URL: [email or web address] Date: 02/22/2023 Prepared by: Harrie Foreman  Exercises - Modified Thomas Stretch  - 1 x daily - 7 x weekly - 1 sets - 3 reps - 30-45 sec hold - Hip Flexor Stretch with Chair  - 1 x daily - 7 x weekly - 1 sets - 3 reps - 30-45 sec hold - Supine Hamstring Stretch  - 1 x daily - 7 x weekly - 1 sets - 3 reps - 30-45 sec hold - Supine Hamstring Stretch with Strap  - 1 x daily - 7 x weekly - 1 sets - 3 reps - 30-45 sec hold - Supine ITB Stretch with Strap  - 1 x daily - 7 x weekly - 1 sets - 3 reps - 30-45 sec hold - Hip Adductors and Hamstring Stretch with Strap  - 1 x daily - 7 x weekly - 1 sets - 3 reps - 30-45 sec hold - Seated Hamstring Stretch  - 1 x daily - 7 x weekly - 1 sets - 3 reps - 30-45 sec hold - Double Leg Hamstring Stretch at Wall  - 1 x daily - 7 x weekly - 1 sets - 1 reps - 3-5 min hold - Single Leg Balance with Clock Reach  - 1 x daily - 7 x weekly - 1 sets - 10 reps - Forward T with Counter Support  - 1 x daily - 7 x weekly - 1 sets - 10 reps - Prone Quadriceps Stretch with Strap  - 1 x daily - 7 x weekly - 1 sets - 3 reps - 30 sec hold - Modified Thomas Stretch  - 1 x daily - 7 x weekly - 3 sets - 10 reps - Lunge Matrix on Slider  - 1 x daily - 7 x weekly - 3 sets - 10 reps - Thoracic Foam Roll Mobilization Backstroke  - 1 x daily - 7 x weekly - 3 sets - 10 reps  ASSESSMENT:  CLINICAL IMPRESSION: DONNY HUTLEY reports no R knee pain today and a  little more extensibility in R quad after TrDN.  Today continued to focus on strengthening followed by stretching.  Also gave exercises for posture as continues to have extreme forward flexed posture.   JUNIS FAURE continues to demonstrate potential for improvement and would benefit from continued skilled therapy to address impairments.      OBJECTIVE IMPAIRMENTS: Abnormal gait, decreased activity tolerance, difficulty walking, decreased ROM, decreased strength, hypomobility, increased fascial restrictions, increased muscle spasms, impaired flexibility, postural  dysfunction, and pain.   ACTIVITY LIMITATIONS: bending, sitting, transfers, dressing, and locomotion level  PARTICIPATION LIMITATIONS: community activity  PERSONAL FACTORS: Past/current experiences and 1-2 comorbidities: Hx R meniscal tear, hx laminectomy L4-5, DM  are also affecting patient's functional outcome.   REHAB POTENTIAL: Good  CLINICAL DECISION MAKING: Evolving/moderate complexity  EVALUATION COMPLEXITY: Moderate   GOALS: Goals reviewed with patient? Yes  SHORT TERM GOALS: Target date: 02/20/2023   Patient will be independent with initial HEP. Baseline: given Goal status: MET 02/14/22   LONG TERM GOALS: Target date: 03/20/2023  Patient will be independent with advanced/ongoing HEP to improve outcomes and carryover.  Baseline:  Goal status: IN PROGRESS  2.  Patient will report at least 75% improvement in R knee pain with activity to improve QOL. Baseline: 5-6/10 sharp pain with twisting movement Goal status: IN PROGRESS  3.  Patient will demonstrate improved functional LE strength as demonstrated by 5/5 hip extensor strength. Baseline: 3/5 Goal status: IN PROGRESS  4.  Patient will be able to walk for 30 minutes without increased R knee pain.  Baseline: 10 minutes without pain Goal status: IN PROGRESS  5.  Patient will report 9 points improvement on LEFS to demonstrate improved functional ability. Baseline: 67/80 Goal status: IN PROGRESS   PLAN:  PT FREQUENCY: 1-2x/week  PT DURATION: 6 weeks  PLANNED INTERVENTIONS: 97110-Therapeutic exercises, 97530- Therapeutic activity, [id number]- Neuromuscular re-education, 97535- Self Care, [id number]- Manual therapy, [id number]- Gait training, 97014- Electrical stimulation (unattended), [phone]- Electrical stimulation (manual), [id number]- Ultrasound, 82956- Ionotophoresis 4mg /ml Dexamethasone, Patient/Family education, Balance training, Stair training, Taping, Dry Needling, Joint mobilization, Spinal mobilization, Cryotherapy, and Moist  heat  PLAN FOR NEXT SESSION: focus on hip mobility and glute strengthening, extremely tight hips and hip flexors, no hip extension during gait.     Jena Gauss, PT 02/22/2023, 2:59 PM

## 2023-02-27 ENCOUNTER — Encounter: Payer: Self-pay | Admitting: Physical Therapy

## 2023-02-27 ENCOUNTER — Ambulatory Visit: Payer: BC Managed Care – PPO | Admitting: Physical Therapy

## 2023-02-27 DIAGNOSIS — M6281 Muscle weakness (generalized): Secondary | ICD-10-CM

## 2023-02-27 DIAGNOSIS — R2689 Other abnormalities of gait and mobility: Secondary | ICD-10-CM | POA: Diagnosis not present

## 2023-02-27 DIAGNOSIS — M25561 Pain in right knee: Secondary | ICD-10-CM | POA: Diagnosis not present

## 2023-02-27 DIAGNOSIS — R293 Abnormal posture: Secondary | ICD-10-CM | POA: Diagnosis not present

## 2023-02-27 DIAGNOSIS — M222X1 Patellofemoral disorders, right knee: Secondary | ICD-10-CM | POA: Diagnosis not present

## 2023-02-27 NOTE — Therapy (Signed)
OUTPATIENT PHYSICAL THERAPY TREATMENT   Patient Name: Anthony Russell MRN: [id number] DOB:March 23, 1980, 43 y.o., male Today's Date: 02/27/2023  END OF SESSION:  PT End of Session - 02/27/23 1409     Visit Number 5    Date for PT Re-Evaluation 03/20/23    Authorization Type BCBS    PT Start Time 1406    PT Stop Time 1448    PT Time Calculation (min) 42 min    Activity Tolerance Patient tolerated treatment well    Behavior During Therapy WFL for tasks assessed/performed              Past Medical History:  Diagnosis Date   Allergy    Allergy, unspecified not elsewhere classified    rx w/ OTC antihistamines PRN   Anxiety    Asthma    Atypical chest pain    recent neg eval w/ normal cxr/ekg   Depression    Diverticulosis    DM (diabetes mellitus) (HCC)    Dyspepsia    on protonix 40mg /d   Esophagitis    GERD (gastroesophageal reflux disease)    on protonix 40mg /d   HTN (hypertension)    Hyperlipidemia    on diet alone   IBS (irritable bowel syndrome)    Lumbar back pain    s/p lumbar laminectomy 2007 by DrCabbell   Migraines    Overweight(278.02)    weight Jan10=246#.[name].he was 225# in 9/07...diet and exercise was discussed   Sleep apnea    not wearing c-pap currently   Past Surgical History:  Procedure Laterality Date   DENTAL SURGERY     LUMBAR LAMINECTOMY  01/30/2005   DrCabbell   TEAR DUCT PROBING     Patient Active Problem List   Diagnosis Date Noted   Lumbar radiculopathy 05/10/2022   Capsulitis of left shoulder 05/10/2022   Sinus tachycardia 12/28/2021   Impingement of knee joint, right 12/20/2021   Achilles tendinitis, right leg 12/20/2021   Rotator cuff tendinitis, right 11/04/2021   Encounter for screening for other metabolic disorders 04/07/2021   Dyspnea on exertion 04/07/2021   Migraines 02/23/2021   Lumbar back pain 02/23/2021   HTN (hypertension) 02/23/2021   Dyspepsia 02/23/2021   DM (diabetes mellitus) (HCC) 02/23/2021   Nasal septal  deviation 02/17/2021   Laryngitis 01/19/2021   Asymmetric tonsils 12/01/2020   Subacromial bursitis of right shoulder joint 05/17/2020   Hypersomnia 11/03/2015   Obesity 11/03/2015   Allergic rhinitis 04/15/2014   Asthma in adult 04/15/2014   GERD (gastroesophageal reflux disease) 04/15/2014   IBS (irritable bowel syndrome) 04/01/2013   Nausea alone 01/15/2013   Diarrhea 01/15/2013   Depression 10/15/2012   Lesion of eyebrow 02/09/2012   Gastroparesis 07/05/2011   Anxiety 07/05/2010   TESTOSTERONE DEFICIENCY 05/26/2008   Hyperlipidemia 02/06/2008   Facet arthropathy, lumbar 02/06/2008   Atypical chest pain 02/06/2008    PCP: Esperanza Richters, PA-C   REFERRING PROVIDER: Burna Forts, MD  REFERRING DIAG: [phone] (ICD-10-CM) - Patellofemoral pain syndrome of right knee   THERAPY DIAG:  Acute pain of right knee  Muscle weakness (generalized)  Other abnormalities of gait and mobility  Abnormal posture  Rationale for Evaluation and Treatment: Rehabilitation  ONSET DATE: 12/31/2022  SUBJECTIVE:   SUBJECTIVE STATEMENT: Having less pain, just tightness.  Feels like he's making progress.     PERTINENT HISTORY: Hx R meniscal tear (2020?), hx laminectomy L4-5, DM PAIN:  Are you having pain? Yes: NPRS scale: 0 currently, 6 when crossing leg or  bending/10 Pain location: R knee Pain description: sharp, achey Aggravating factors: certain movements - lifting up to put on shoes/socks, getting into car (twisting of knee) Relieving factors: goes away when stops the movement  PRECAUTIONS: None  RED FLAGS: None   WEIGHT BEARING RESTRICTIONS: No  FALLS:  Has patient fallen in last 6 months? No  LIVING ENVIRONMENT: Lives with: lives alone Lives in: House/apartment Stairs: Yes: External: 2 flights steps; can reach both Has following equipment at home: None  OCCUPATION:  Sport and exercise psychologist, works at home; has sit down and stand up desk   PLOF: Independent and Leisure: rock  boxing  PATIENT GOALS: no pain, do normal motion, walk for 30 minutes without pain  NEXT MD VISIT: follow up if needed after PT  OBJECTIVE:  Note: Objective measures were completed at Evaluation unless otherwise noted.  DIAGNOSTIC FINDINGS: no imaging of knee  PATIENT SURVEYS:  LEFS 67/80  COGNITION: Overall cognitive status: Within functional limits for tasks assessed     SENSATION: WFL  EDEMA:  None noted  MUSCLE LENGTH: Hamstrings: significant tightness bil Right ~60 deg; Left ~60 deg Quads - significant tightness bil Hip flexors - significant tightness bil- unable to fully extend hip in prone on R Itbands - significant tightness bil  POSTURE: rounded shoulders, forward head, and increased thoracic kyphosis  PALPATION: Tenderness along R superior patellofemoral tendon  LOWER EXTREMITY ROM:  limited hip internal rotation bil due to tightness   LOWER EXTREMITY MMT:  MMT Right eval Left eval  Hip flexion 5 5  Hip extension 3 3  Hip abduction 5 5  Hip adduction 5 5  Knee flexion 5 4  Knee extension 5p! 5   (Blank rows = not tested)  LOWER EXTREMITY SPECIAL TESTS:  Knee special tests: McMurray's test: negative  GAIT: Distance walked: 200' Assistive device utilized: None Level of assistance: Complete Independence Comments: forward flexed posture, lacking hip extension bilaterally during stance phase.                                                                                                                                TREATMENT DATE:   02/27/23 Therapeutic Exercise: to improve strength and mobility.  Demo, verbal and tactile cues throughout for technique. Elliptical L1 x 5 min At counter for safety -hovering hands  Sliding lunges to side x 10 R/L  Sliding forward lunge x 10 R/L Siding back lunge x 10 R/L  Single leg RDL x 10 R/L  Elephant walks -hands on table.  SKTC stretch Hamstring stretch with strap 2 x 1 min  Manual Therapy: to decrease  muscle spasm and pain and improve mobility STM/TPR to R quad, R hamsting Trigger Point Dry Needling  Subsequent Treatment: Instructions provided previously at initial dry needling treatment.   Patient Verbal Consent Given: Yes Education Handout Provided: Previously Provided Muscles Treated: R rectus femoris, R lateral hamstring Electrical Stimulation Performed: Yes, Parameters: R quad 30mA enough  for palpable twitch Treatment Response/Outcome: Twitch Response Elicited and Palpable Increase in Muscle Length   02/22/23 Therapeutic Exercise: to improve strength and mobility.  Demo, verbal and tactile cues throughout for technique. Elliptical L1 x 4 min At counter for safety with 1 UE support Sliding lunges to side x 10 R/L  Sliding forward lunge x 10 R/L Siding back lunge x 10 R/L  Single leg RDL x 10 R/L  Elevated ATG split squat knee stretch  Squats x 10 Squats x 10 with elevated heels - easier Hip flexor stretch in supine x 1 min  Prone quad stretch 2 x 1 min each Supine thoracic mob on foam roller - back stroke x 10 HEP update   02/15/2023 Therapeutic Exercise: to improve strength and mobility.  Demo, verbal and tactile cues throughout for technique. Bike L2 x 6 min -reported knee pain Star excursion - side, back and cross step Lunges x 10 R/L - challenging.  Single leg RDLs x 10 R/L Modified thomas stretch for hip flexors x 1 min R/L Prone quad stretch with strap - R pre and post manual therapy Manual Therapy: to decrease muscle spasm and pain and improve mobility Quad stretch in prone with progressive stretch using contract relax, STM/TPR to R patellar tendon/distal rectus femoris, skilled palpation and monitoring during dry needling. Trigger Point Dry Needling  Subsequent Treatment: Instructions provided previously at initial dry needling treatment.   Patient Verbal Consent Given: Yes Education Handout Provided: Yes updated copy given, reviewed how to decrease soreness  following TrDN Muscles Treated: distal rectus femoris Electrical Stimulation Performed: No Treatment Response/Outcome: Palpable Increase in Muscle Length   PATIENT EDUCATION:  Education details: HEP review Person educated: Patient Education method: Medical illustrator Education comprehension: verbalized understanding and returned demonstration  HOME EXERCISE PROGRAM: Access Code: [email or web address] URL: [email or web address] Date: 02/22/2023 Prepared by: Harrie Foreman  Exercises - Modified Thomas Stretch  - 1 x daily - 7 x weekly - 1 sets - 3 reps - 30-45 sec hold - Hip Flexor Stretch with Chair  - 1 x daily - 7 x weekly - 1 sets - 3 reps - 30-45 sec hold - Supine Hamstring Stretch  - 1 x daily - 7 x weekly - 1 sets - 3 reps - 30-45 sec hold - Supine Hamstring Stretch with Strap  - 1 x daily - 7 x weekly - 1 sets - 3 reps - 30-45 sec hold - Supine ITB Stretch with Strap  - 1 x daily - 7 x weekly - 1 sets - 3 reps - 30-45 sec hold - Hip Adductors and Hamstring Stretch with Strap  - 1 x daily - 7 x weekly - 1 sets - 3 reps - 30-45 sec hold - Seated Hamstring Stretch  - 1 x daily - 7 x weekly - 1 sets - 3 reps - 30-45 sec hold - Double Leg Hamstring Stretch at Wall  - 1 x daily - 7 x weekly - 1 sets - 1 reps - 3-5 min hold - Single Leg Balance with Clock Reach  - 1 x daily - 7 x weekly - 1 sets - 10 reps - Forward T with Counter Support  - 1 x daily - 7 x weekly - 1 sets - 10 reps - Prone Quadriceps Stretch with Strap  - 1 x daily - 7 x weekly - 1 sets - 3 reps - 30 sec hold - Modified Thomas Stretch  - 1 x daily - 7 x weekly -  3 sets - 10 reps - Lunge Matrix on Slider  - 1 x daily - 7 x weekly - 3 sets - 10 reps - Thoracic Foam Roll Mobilization Backstroke  - 1 x daily - 7 x weekly - 3 sets - 10 reps  ASSESSMENT:  CLINICAL IMPRESSION: Anthony Russell reports continued progress, but continued tightness in R knee and hamstrings.  Demonstrated improved form and  strength with lunges and single leg RDLs today.  Repeated TrDN to R rectus, adding brief Estim to activate rectus and decrease pain, tolerated well.   Anthony Russell continues to demonstrate potential for improvement and would benefit from continued skilled therapy to address impairments.      OBJECTIVE IMPAIRMENTS: Abnormal gait, decreased activity tolerance, difficulty walking, decreased ROM, decreased strength, hypomobility, increased fascial restrictions, increased muscle spasms, impaired flexibility, postural dysfunction, and pain.   ACTIVITY LIMITATIONS: bending, sitting, transfers, dressing, and locomotion level  PARTICIPATION LIMITATIONS: community activity  PERSONAL FACTORS: Past/current experiences and 1-2 comorbidities: Hx R meniscal tear, hx laminectomy L4-5, DM  are also affecting patient's functional outcome.   REHAB POTENTIAL: Good  CLINICAL DECISION MAKING: Evolving/moderate complexity  EVALUATION COMPLEXITY: Moderate   GOALS: Goals reviewed with patient? Yes  SHORT TERM GOALS: Target date: 02/20/2023   Patient will be independent with initial HEP. Baseline: given Goal status: MET 02/14/22   LONG TERM GOALS: Target date: 03/20/2023  Patient will be independent with advanced/ongoing HEP to improve outcomes and carryover.  Baseline:  Goal status: IN PROGRESS  2.  Patient will report at least 75% improvement in R knee pain with activity to improve QOL. Baseline: 5-6/10 sharp pain with twisting movement Goal status: IN PROGRESS  3.  Patient will demonstrate improved functional LE strength as demonstrated by 5/5 hip extensor strength. Baseline: 3/5 Goal status: IN PROGRESS  4.  Patient will be able to walk for 30 minutes without increased R knee pain.  Baseline: 10 minutes without pain Goal status: IN PROGRESS  5.  Patient will report 9 points improvement on LEFS to demonstrate improved functional ability. Baseline: 67/80 Goal status: IN  PROGRESS   PLAN:  PT FREQUENCY: 1-2x/week  PT DURATION: 6 weeks  PLANNED INTERVENTIONS: 97110-Therapeutic exercises, 97530- Therapeutic activity, [id number]- Neuromuscular re-education, 97535- Self Care, 04540- Manual therapy, [id number]- Gait training, 97014- Electrical stimulation (unattended), [phone]- Electrical stimulation (manual), [id number]- Ultrasound, [id number]- Ionotophoresis 4mg /ml Dexamethasone, Patient/Family education, Balance training, Stair training, Taping, Dry Needling, Joint mobilization, Spinal mobilization, Cryotherapy, and Moist heat  PLAN FOR NEXT SESSION: focus on hip mobility and glute strengthening, extremely tight hips and hip flexors, no hip extension during gait.     Jena Gauss, PT 02/27/2023, 3:15 PM

## 2023-03-01 ENCOUNTER — Ambulatory Visit: Payer: BC Managed Care – PPO

## 2023-03-01 DIAGNOSIS — M6281 Muscle weakness (generalized): Secondary | ICD-10-CM

## 2023-03-01 DIAGNOSIS — R293 Abnormal posture: Secondary | ICD-10-CM | POA: Diagnosis not present

## 2023-03-01 DIAGNOSIS — M25561 Pain in right knee: Secondary | ICD-10-CM | POA: Diagnosis not present

## 2023-03-01 DIAGNOSIS — M222X1 Patellofemoral disorders, right knee: Secondary | ICD-10-CM | POA: Diagnosis not present

## 2023-03-01 DIAGNOSIS — R2689 Other abnormalities of gait and mobility: Secondary | ICD-10-CM

## 2023-03-01 NOTE — Therapy (Signed)
OUTPATIENT PHYSICAL THERAPY TREATMENT   Patient Name: Anthony Russell MRN: [id number] DOB:1980/08/17, 43 y.o., male Today's Date: 03/01/2023  END OF SESSION:  PT End of Session - 03/01/23 1420     Visit Number 6    Date for PT Re-Evaluation 03/20/23    Authorization Type [employer]    PT Start Time 1404    PT Stop Time 1443    PT Time Calculation (min) 39 min    Activity Tolerance Patient tolerated treatment well    Behavior During Therapy WFL for tasks assessed/performed               Past Medical History:  Diagnosis Date   Allergy    Allergy, unspecified not elsewhere classified    rx w/ OTC antihistamines PRN   Anxiety    Asthma    Atypical chest pain    recent neg eval w/ normal cxr/ekg   Depression    Diverticulosis    DM (diabetes mellitus) (HCC)    Dyspepsia    on protonix 40mg /d   Esophagitis    GERD (gastroesophageal reflux disease)    on protonix 40mg /d   HTN (hypertension)    Hyperlipidemia    on diet alone   IBS (irritable bowel syndrome)    Lumbar back pain    s/p lumbar laminectomy 2007 by DrCabbell   Migraines    Overweight(278.02)    weight Jan10=246#.[name].he was 225# in 9/07...diet and exercise was discussed   Sleep apnea    not wearing c-pap currently   Past Surgical History:  Procedure Laterality Date   DENTAL SURGERY     LUMBAR LAMINECTOMY  01/30/2005   DrCabbell   TEAR DUCT PROBING     Patient Active Problem List   Diagnosis Date Noted   Lumbar radiculopathy 05/10/2022   Capsulitis of left shoulder 05/10/2022   Sinus tachycardia 12/28/2021   Impingement of knee joint, right 12/20/2021   Achilles tendinitis, right leg 12/20/2021   Rotator cuff tendinitis, right 11/04/2021   Encounter for screening for other metabolic disorders 04/07/2021   Dyspnea on exertion 04/07/2021   Migraines 02/23/2021   Lumbar back pain 02/23/2021   HTN (hypertension) 02/23/2021   Dyspepsia 02/23/2021   DM (diabetes mellitus) (HCC) 02/23/2021   Nasal  septal deviation 02/17/2021   Laryngitis 01/19/2021   Asymmetric tonsils 12/01/2020   Subacromial bursitis of right shoulder joint 05/17/2020   Hypersomnia 11/03/2015   Obesity 11/03/2015   Allergic rhinitis 04/15/2014   Asthma in adult 04/15/2014   GERD (gastroesophageal reflux disease) 04/15/2014   IBS (irritable bowel syndrome) 04/01/2013   Nausea alone 01/15/2013   Diarrhea 01/15/2013   Depression 10/15/2012   Lesion of eyebrow 02/09/2012   Gastroparesis 07/05/2011   Anxiety 07/05/2010   TESTOSTERONE DEFICIENCY 05/26/2008   Hyperlipidemia 02/06/2008   Facet arthropathy, lumbar 02/06/2008   Atypical chest pain 02/06/2008    PCP: Esperanza Richters, PA-C   REFERRING PROVIDER: Burna Forts, MD  REFERRING DIAG: [phone] (ICD-10-CM) - Patellofemoral pain syndrome of right knee   THERAPY DIAG:  Acute pain of right knee  Muscle weakness (generalized)  Other abnormalities of gait and mobility  Abnormal posture  Rationale for Evaluation and Treatment: Rehabilitation  ONSET DATE: 12/31/2022  SUBJECTIVE:   SUBJECTIVE STATEMENT: Tightness in the knee today.  PERTINENT HISTORY: Hx R meniscal tear (2020?), hx laminectomy L4-5, DM PAIN:  Are you having pain? Yes: NPRS scale: 0 currently, 6 when crossing leg or bending/10 Pain location: R knee Pain description: sharp,  achey Aggravating factors: certain movements - lifting up to put on shoes/socks, getting into car (twisting of knee) Relieving factors: goes away when stops the movement  PRECAUTIONS: None  RED FLAGS: None   WEIGHT BEARING RESTRICTIONS: No  FALLS:  Has patient fallen in last 6 months? No  LIVING ENVIRONMENT: Lives with: lives alone Lives in: House/apartment Stairs: Yes: External: 2 flights steps; can reach both Has following equipment at home: None  OCCUPATION:  Sport and exercise psychologist, works at home; has sit down and stand up desk   PLOF: Independent and Leisure: rock boxing  PATIENT GOALS: no pain,  do normal motion, walk for 30 minutes without pain  NEXT MD VISIT: follow up if needed after PT  OBJECTIVE:  Note: Objective measures were completed at Evaluation unless otherwise noted.  DIAGNOSTIC FINDINGS: no imaging of knee  PATIENT SURVEYS:  LEFS 67/80  COGNITION: Overall cognitive status: Within functional limits for tasks assessed     SENSATION: WFL  EDEMA:  None noted  MUSCLE LENGTH: Hamstrings: significant tightness bil Right ~60 deg; Left ~60 deg Quads - significant tightness bil Hip flexors - significant tightness bil- unable to fully extend hip in prone on R Itbands - significant tightness bil  POSTURE: rounded shoulders, forward head, and increased thoracic kyphosis  PALPATION: Tenderness along R superior patellofemoral tendon  LOWER EXTREMITY ROM:  limited hip internal rotation bil due to tightness   LOWER EXTREMITY MMT:  MMT Right eval Left eval  Hip flexion 5 5  Hip extension 3 3  Hip abduction 5 5  Hip adduction 5 5  Knee flexion 5 4  Knee extension 5p! 5   (Blank rows = not tested)  LOWER EXTREMITY SPECIAL TESTS:  Knee special tests: McMurray's test: negative  GAIT: Distance walked: 200' Assistive device utilized: None Level of assistance: Complete Independence Comments: forward flexed posture, lacking hip extension bilaterally during stance phase.                                                                                                                                TREATMENT DATE:  03/01/23 Therapeutic Exercise: to improve strength and mobility.  Demo, verbal and tactile cues throughout for technique. Bike L3x34min Side lunge with TRX x 10 R/L Conventional slide lunge 2 x 10 R/L S/L clamshell RTB 2 x 10 bil S/L hip abduction 2 x 10 bil Manual Therapy: to decrease muscle spasm and pain and improve mobility Knee flexion with tibia IR, traction and flexion AP mobs for flexion  02/27/23 Therapeutic Exercise: to improve strength  and mobility.  Demo, verbal and tactile cues throughout for technique. Elliptical L1 x 5 min At counter for safety -hovering hands  Sliding lunges to side x 10 R/L  Sliding forward lunge x 10 R/L Siding back lunge x 10 R/L  Single leg RDL x 10 R/L  Elephant walks -hands on table.  SKTC stretch Hamstring stretch with strap 2 x 1 min  Manual Therapy: to decrease muscle spasm and pain and improve mobility STM/TPR to R quad, R hamsting Trigger Point Dry Needling  Subsequent Treatment: Instructions provided previously at initial dry needling treatment.   Patient Verbal Consent Given: Yes Education Handout Provided: Previously Provided Muscles Treated: R rectus femoris, R lateral hamstring Electrical Stimulation Performed: Yes, Parameters: R quad 30mA enough for palpable twitch Treatment Response/Outcome: Twitch Response Elicited and Palpable Increase in Muscle Length   02/22/23 Therapeutic Exercise: to improve strength and mobility.  Demo, verbal and tactile cues throughout for technique. Elliptical L1 x 4 min At counter for safety with 1 UE support Sliding lunges to side x 10 R/L  Sliding forward lunge x 10 R/L Siding back lunge x 10 R/L  Single leg RDL x 10 R/L  Elevated ATG split squat knee stretch  Squats x 10 Squats x 10 with elevated heels - easier Hip flexor stretch in supine x 1 min  Prone quad stretch 2 x 1 min each Supine thoracic mob on foam roller - back stroke x 10 HEP update   02/15/2023 Therapeutic Exercise: to improve strength and mobility.  Demo, verbal and tactile cues throughout for technique. Bike L2 x 6 min -reported knee pain Star excursion - side, back and cross step Lunges x 10 R/L - challenging.  Single leg RDLs x 10 R/L Modified thomas stretch for hip flexors x 1 min R/L Prone quad stretch with strap - R pre and post manual therapy Manual Therapy: to decrease muscle spasm and pain and improve mobility Quad stretch in prone with progressive stretch  using contract relax, STM/TPR to R patellar tendon/distal rectus femoris, skilled palpation and monitoring during dry needling. Trigger Point Dry Needling  Subsequent Treatment: Instructions provided previously at initial dry needling treatment.   Patient Verbal Consent Given: Yes Education Handout Provided: Yes updated copy given, reviewed how to decrease soreness following TrDN Muscles Treated: distal rectus femoris Electrical Stimulation Performed: No Treatment Response/Outcome: Palpable Increase in Muscle Length   PATIENT EDUCATION:  Education details: HEP review Person educated: Patient Education method: Medical illustrator Education comprehension: verbalized understanding and returned demonstration  HOME EXERCISE PROGRAM: Access Code: [email or web address] URL: [email or web address] Date: 02/22/2023 Prepared by: Harrie Foreman  Exercises - Modified Thomas Stretch  - 1 x daily - 7 x weekly - 1 sets - 3 reps - 30-45 sec hold - Hip Flexor Stretch with Chair  - 1 x daily - 7 x weekly - 1 sets - 3 reps - 30-45 sec hold - Supine Hamstring Stretch  - 1 x daily - 7 x weekly - 1 sets - 3 reps - 30-45 sec hold - Supine Hamstring Stretch with Strap  - 1 x daily - 7 x weekly - 1 sets - 3 reps - 30-45 sec hold - Supine ITB Stretch with Strap  - 1 x daily - 7 x weekly - 1 sets - 3 reps - 30-45 sec hold - Hip Adductors and Hamstring Stretch with Strap  - 1 x daily - 7 x weekly - 1 sets - 3 reps - 30-45 sec hold - Seated Hamstring Stretch  - 1 x daily - 7 x weekly - 1 sets - 3 reps - 30-45 sec hold - Double Leg Hamstring Stretch at Wall  - 1 x daily - 7 x weekly - 1 sets - 1 reps - 3-5 min hold - Single Leg Balance with Clock Reach  - 1 x daily - 7 x weekly - 1 sets - 10  reps - Forward T with Counter Support  - 1 x daily - 7 x weekly - 1 sets - 10 reps - Prone Quadriceps Stretch with Strap  - 1 x daily - 7 x weekly - 1 sets - 3 reps - 30 sec hold - Modified Thomas Stretch  - 1  x daily - 7 x weekly - 3 sets - 10 reps - Lunge Matrix on Slider  - 1 x daily - 7 x weekly - 3 sets - 10 reps - Thoracic Foam Roll Mobilization Backstroke  - 1 x daily - 7 x weekly - 3 sets - 10 reps  ASSESSMENT:  CLINICAL IMPRESSION: Continued progressing hip and knee strengthening. Pt is seeing improvement in his ability to squat down to pick things up with less knee pain. Anthony Russell continues to demonstrate potential for improvement and would benefit from continued skilled therapy to address impairments.      OBJECTIVE IMPAIRMENTS: Abnormal gait, decreased activity tolerance, difficulty walking, decreased ROM, decreased strength, hypomobility, increased fascial restrictions, increased muscle spasms, impaired flexibility, postural dysfunction, and pain.   ACTIVITY LIMITATIONS: bending, sitting, transfers, dressing, and locomotion level  PARTICIPATION LIMITATIONS: community activity  PERSONAL FACTORS: Past/current experiences and 1-2 comorbidities: Hx R meniscal tear, hx laminectomy L4-5, DM  are also affecting patient's functional outcome.   REHAB POTENTIAL: Good  CLINICAL DECISION MAKING: Evolving/moderate complexity  EVALUATION COMPLEXITY: Moderate   GOALS: Goals reviewed with patient? Yes  SHORT TERM GOALS: Target date: 02/20/2023   Patient will be independent with initial HEP. Baseline: given Goal status: MET 02/14/22   LONG TERM GOALS: Target date: 03/20/2023  Patient will be independent with advanced/ongoing HEP to improve outcomes and carryover.  Baseline:  Goal status: IN PROGRESS  2.  Patient will report at least 75% improvement in R knee pain with activity to improve QOL. Baseline: 5-6/10 sharp pain with twisting movement Goal status: IN PROGRESS- 03/01/23 pt reports 70% improvement  3.  Patient will demonstrate improved functional LE strength as demonstrated by 5/5 hip extensor strength. Baseline: 3/5 Goal status: IN PROGRESS  4.  Patient will be able to  walk for 30 minutes without increased R knee pain.  Baseline: 10 minutes without pain Goal status: IN PROGRESS  5.  Patient will report 9 points improvement on LEFS to demonstrate improved functional ability. Baseline: 67/80 Goal status: IN PROGRESS   PLAN:  PT FREQUENCY: 1-2x/week  PT DURATION: 6 weeks  PLANNED INTERVENTIONS: 97110-Therapeutic exercises, 97530- Therapeutic activity, [id number]- Neuromuscular re-education, 97535- Self Care, 11914- Manual therapy, [id number]- Gait training, 97014- Electrical stimulation (unattended), [phone]- Electrical stimulation (manual), [id number]- Ultrasound, 62130- Ionotophoresis 4mg /ml Dexamethasone, Patient/Family education, Balance training, Stair training, Taping, Dry Needling, Joint mobilization, Spinal mobilization, Cryotherapy, and Moist heat  PLAN FOR NEXT SESSION: focus on hip mobility and glute strengthening, extremely tight hips and hip flexors, no hip extension during gait.     Darleene Cleaver, PTA 03/01/2023, 2:46 PM

## 2023-03-06 ENCOUNTER — Encounter: Payer: Self-pay | Admitting: Physical Therapy

## 2023-03-06 ENCOUNTER — Ambulatory Visit: Payer: BC Managed Care – PPO | Attending: Family Medicine | Admitting: Physical Therapy

## 2023-03-06 DIAGNOSIS — M6281 Muscle weakness (generalized): Secondary | ICD-10-CM | POA: Insufficient documentation

## 2023-03-06 DIAGNOSIS — R293 Abnormal posture: Secondary | ICD-10-CM | POA: Insufficient documentation

## 2023-03-06 DIAGNOSIS — M25561 Pain in right knee: Secondary | ICD-10-CM | POA: Diagnosis not present

## 2023-03-06 DIAGNOSIS — R252 Cramp and spasm: Secondary | ICD-10-CM | POA: Diagnosis not present

## 2023-03-06 NOTE — Therapy (Signed)
 OUTPATIENT PHYSICAL THERAPY TREATMENT   Patient Name: Anthony Russell MRN: [id number] DOB:November 10, 1980, 43 y.o., male Today's Date: 03/06/2023  END OF SESSION:  PT End of Session - 03/06/23 1350     Visit Number 7    Date for PT Re-Evaluation 03/20/23    Authorization Type [employer]    PT Start Time 1350    PT Stop Time 1440    PT Time Calculation (min) 50 min    Activity Tolerance Patient tolerated treatment well    Behavior During Therapy WFL for tasks assessed/performed               Past Medical History:  Diagnosis Date   Allergy    Allergy, unspecified not elsewhere classified    rx w/ OTC antihistamines PRN   Anxiety    Asthma    Atypical chest pain    recent neg eval w/ normal cxr/ekg   Depression    Diverticulosis    DM (diabetes mellitus) (HCC)    Dyspepsia    on protonix  40mg /d   Esophagitis    GERD (gastroesophageal reflux disease)    on protonix  40mg /d   HTN (hypertension)    Hyperlipidemia    on diet alone   IBS (irritable bowel syndrome)    Lumbar back pain    s/p lumbar laminectomy 2007 by DrCabbell   Migraines    Overweight(278.02)    weight Jan10=246#.SABRA.he was 225# in 9/07...diet and exercise was discussed   Sleep apnea    not wearing c-pap currently   Past Surgical History:  Procedure Laterality Date   DENTAL SURGERY     LUMBAR LAMINECTOMY  01/30/2005   DrCabbell   TEAR DUCT PROBING     Patient Active Problem List   Diagnosis Date Noted   Lumbar radiculopathy 05/10/2022   Capsulitis of left shoulder 05/10/2022   Sinus tachycardia 12/28/2021   Impingement of knee joint, right 12/20/2021   Achilles tendinitis, right leg 12/20/2021   Rotator cuff tendinitis, right 11/04/2021   Encounter for screening for other metabolic disorders 04/07/2021   Dyspnea on exertion 04/07/2021   Migraines 02/23/2021   Lumbar back pain 02/23/2021   HTN (hypertension) 02/23/2021   Dyspepsia 02/23/2021   DM (diabetes mellitus) (HCC) 02/23/2021   Nasal septal  deviation 02/17/2021   Laryngitis 01/19/2021   Asymmetric tonsils 12/01/2020   Subacromial bursitis of right shoulder joint 05/17/2020   Hypersomnia 11/03/2015   Obesity 11/03/2015   Allergic rhinitis 04/15/2014   Asthma in adult 04/15/2014   GERD (gastroesophageal reflux disease) 04/15/2014   IBS (irritable bowel syndrome) 04/01/2013   Nausea alone 01/15/2013   Diarrhea 01/15/2013   Depression 10/15/2012   Lesion of eyebrow 02/09/2012   Gastroparesis 07/05/2011   Anxiety 07/05/2010   TESTOSTERONE  DEFICIENCY 05/26/2008   Hyperlipidemia 02/06/2008   Facet arthropathy, lumbar 02/06/2008   Atypical chest pain 02/06/2008    PCP: Dorina Loving, PA-C   REFERRING PROVIDER: Marcene Benedict ORN, MD  REFERRING DIAG: [phone] (ICD-10-CM) - Patellofemoral pain syndrome of right knee   THERAPY DIAG:  Acute pain of right knee  Muscle weakness (generalized)  Abnormal posture  Cramp and spasm  Rationale for Evaluation and Treatment: Rehabilitation  ONSET DATE: 12/31/2022  SUBJECTIVE:   SUBJECTIVE STATEMENT: Knee is doing better, not hurting at moment, tightness when lifting high enough to put on jeans.   PERTINENT HISTORY: Hx R meniscal tear (2020?), hx laminectomy L4-5, DM PAIN:  Are you having pain? Yes: NPRS scale: 0 currently, 6 when crossing  leg or bending/10 Pain location: R knee Pain description: sharp, achey Aggravating factors: certain movements - lifting up to put on shoes/socks, getting into car (twisting of knee) Relieving factors: goes away when stops the movement  PRECAUTIONS: None  RED FLAGS: None   WEIGHT BEARING RESTRICTIONS: No  FALLS:  Has patient fallen in last 6 months? No  LIVING ENVIRONMENT: Lives with: lives alone Lives in: House/apartment Stairs: Yes: External: 2 flights steps; can reach both Has following equipment at home: None  OCCUPATION:  sport and exercise psychologist, works at home; has sit down and stand up desk   PLOF: Independent and Leisure:  rock boxing  PATIENT GOALS: no pain, do normal motion, walk for 30 minutes without pain  NEXT MD VISIT: follow up if needed after PT  OBJECTIVE:  Note: Objective measures were completed at Evaluation unless otherwise noted.  DIAGNOSTIC FINDINGS: no imaging of knee  PATIENT SURVEYS:  LEFS 67/80  COGNITION: Overall cognitive status: Within functional limits for tasks assessed     SENSATION: WFL  EDEMA:  None noted  MUSCLE LENGTH: Hamstrings: significant tightness bil Right ~60 deg; Left ~60 deg Quads - significant tightness bil Hip flexors - significant tightness bil- unable to fully extend hip in prone on R Itbands - significant tightness bil  POSTURE: rounded shoulders, forward head, and increased thoracic kyphosis  PALPATION: Tenderness along R superior patellofemoral tendon  LOWER EXTREMITY ROM:  limited hip internal rotation bil due to tightness   LOWER EXTREMITY MMT:  MMT Right eval Left eval  Hip flexion 5 5  Hip extension 3 3  Hip abduction 5 5  Hip adduction 5 5  Knee flexion 5 4  Knee extension 5p! 5   (Blank rows = not tested)  LOWER EXTREMITY SPECIAL TESTS:  Knee special tests: McMurray's test: negative  GAIT: Distance walked: 200' Assistive device utilized: None Level of assistance: Complete Independence Comments: forward flexed posture, lacking hip extension bilaterally during stance phase.                                                                                                                                TREATMENT DATE:   03/06/23 Therapeutic Exercise: to improve strength and mobility.  Demo, verbal and tactile cues throughout for technique. Elliptical x 5 min for muscle perfusion Single leg squat 2 block 2 x 10 bil 1 UE support Toe raises on wall 2 x 10  Wall squats isometric hold 10 x 10 sec Hip openers 2 x 10 R/L Quad stretch with strap 2 x 1 min  r/l Neuromuscular Reeducation: for posture Prone press ups x 5 Prone on  elbows x 1 min Prone hip extension x 10 r/l Mobs to back grade 3-4 thoracic and lumbar Manual Therapy: to decrease muscle spasm and pain and improve mobility IASTM with s/s edge tool to R distal quad and pes anserine mm. Improved knee flexion following,   passive quad stretch.  03/01/23 Therapeutic Exercise: to improve strength and mobility.  Demo, verbal and tactile cues throughout for technique. Bike L3x82min Side lunge with TRX x 10 R/L Conventional slide lunge 2 x 10 R/L S/L clamshell RTB 2 x 10 bil S/L hip abduction 2 x 10 bil Manual Therapy: to decrease muscle spasm and pain and improve mobility Knee flexion with tibia IR, traction and flexion AP mobs for flexion  02/27/23 Therapeutic Exercise: to improve strength and mobility.  Demo, verbal and tactile cues throughout for technique. Elliptical L1 x 5 min At counter for safety -hovering hands  Sliding lunges to side x 10 R/L  Sliding forward lunge x 10 R/L Siding back lunge x 10 R/L  Single leg RDL x 10 R/L  Elephant walks -hands on table.  SKTC stretch Hamstring stretch with strap 2 x 1 min  Manual Therapy: to decrease muscle spasm and pain and improve mobility STM/TPR to R quad, R hamsting Trigger Point Dry Needling  Subsequent Treatment: Instructions provided previously at initial dry needling treatment.   Patient Verbal Consent Given: Yes Education Handout Provided: Previously Provided Muscles Treated: R rectus femoris, R lateral hamstring Electrical Stimulation Performed: Yes, Parameters: R quad 30mA enough for palpable twitch Treatment Response/Outcome: Twitch Response Elicited and Palpable Increase in Muscle Length   02/22/23 Therapeutic Exercise: to improve strength and mobility.  Demo, verbal and tactile cues throughout for technique. Elliptical L1 x 4 min At counter for safety with 1 UE support Sliding lunges to side x 10 R/L  Sliding forward lunge x 10 R/L Siding back lunge x 10 R/L  Single leg RDL x 10  R/L  Elevated ATG split squat knee stretch  Squats x 10 Squats x 10 with elevated heels - easier Hip flexor stretch in supine x 1 min  Prone quad stretch 2 x 1 min each Supine thoracic mob on foam roller - back stroke x 10 HEP update   02/15/2023 Therapeutic Exercise: to improve strength and mobility.  Demo, verbal and tactile cues throughout for technique. Bike L2 x 6 min -reported knee pain Star excursion - side, back and cross step Lunges x 10 R/L - challenging.  Single leg RDLs x 10 R/L Modified thomas stretch for hip flexors x 1 min R/L Prone quad stretch with strap - R pre and post manual therapy Manual Therapy: to decrease muscle spasm and pain and improve mobility Quad stretch in prone with progressive stretch using contract relax, STM/TPR to R patellar tendon/distal rectus femoris, skilled palpation and monitoring during dry needling. Trigger Point Dry Needling  Subsequent Treatment: Instructions provided previously at initial dry needling treatment.   Patient Verbal Consent Given: Yes Education Handout Provided: Yes updated copy given, reviewed how to decrease soreness following TrDN Muscles Treated: distal rectus femoris Electrical Stimulation Performed: No Treatment Response/Outcome: Palpable Increase in Muscle Length   PATIENT EDUCATION:  Education details: HEP update Person educated: Patient Education method: Explanation, Demonstration, and Handouts Education comprehension: verbalized understanding and returned demonstration  HOME EXERCISE PROGRAM: Access Code: [email or web address] URL: [email or web address] Date: 03/06/2023 Prepared by: Almarie Sprinkles  Exercises - Modified Thomas Stretch  - 1 x daily - 7 x weekly - 1 sets - 3 reps - 30-45 sec hold - Hip Flexor Stretch with Chair  - 1 x daily - 7 x weekly - 1 sets - 3 reps - 30-45 sec hold - Supine Hamstring Stretch  - 1 x daily - 7 x weekly - 1 sets - 3 reps - 30-45 sec hold -  Supine Hamstring Stretch  with Strap  - 1 x daily - 7 x weekly - 1 sets - 3 reps - 30-45 sec hold - Supine ITB Stretch with Strap  - 1 x daily - 7 x weekly - 1 sets - 3 reps - 30-45 sec hold - Hip Adductors and Hamstring Stretch with Strap  - 1 x daily - 7 x weekly - 1 sets - 3 reps - 30-45 sec hold - Seated Hamstring Stretch  - 1 x daily - 7 x weekly - 1 sets - 3 reps - 30-45 sec hold - Double Leg Hamstring Stretch at Wall  - 1 x daily - 7 x weekly - 1 sets - 1 reps - 3-5 min hold - Single Leg Balance with Clock Reach  - 1 x daily - 7 x weekly - 1 sets - 10 reps - Forward T with Counter Support  - 1 x daily - 7 x weekly - 1 sets - 10 reps - Prone Quadriceps Stretch with Strap  - 1 x daily - 7 x weekly - 1 sets - 3 reps - 30 sec hold - Modified Thomas Stretch  - 1 x daily - 7 x weekly - 3 sets - 10 reps - Lunge Matrix on Slider  - 1 x daily - 7 x weekly - 3 sets - 10 reps - Thoracic Foam Roll Mobilization Backstroke  - 1 x daily - 7 x weekly - 3 sets - 10 reps - Toe Raise With Back Against Wall  - 1 x daily - 7 x weekly - 3 sets - 10 reps - Wall Squat  - 1 x daily - 7 x weekly - 3 sets - 10 reps - Lower Quarter Anterior Reach  - 1 x daily - 7 x weekly - 3 sets - 10 reps  ASSESSMENT:  CLINICAL IMPRESSION: Anthony Russell is reporting improving R knee pain and also reports less pull on back as well.  Today continued strengthening patellar tendon with HEP update, tolerated well.  After IASTM to R knee noted significant improvement in extensibility.  He still has significant impairment in posture and lacks full hip extension, noted today with prone press ups and prone on elbows no lumbar extension as well, significant lumbar hypomobility with PA mobs.  Anthony Russell continues to demonstrate potential for improvement and would benefit from continued skilled therapy to address impairments.      OBJECTIVE IMPAIRMENTS: Abnormal gait, decreased activity tolerance, difficulty walking, decreased ROM, decreased strength,  hypomobility, increased fascial restrictions, increased muscle spasms, impaired flexibility, postural dysfunction, and pain.   ACTIVITY LIMITATIONS: bending, sitting, transfers, dressing, and locomotion level  PARTICIPATION LIMITATIONS: community activity  PERSONAL FACTORS: Past/current experiences and 1-2 comorbidities: Hx R meniscal tear, hx laminectomy L4-5, DM  are also affecting patient's functional outcome.   REHAB POTENTIAL: Good  CLINICAL DECISION MAKING: Evolving/moderate complexity  EVALUATION COMPLEXITY: Moderate   GOALS: Goals reviewed with patient? Yes  SHORT TERM GOALS: Target date: 02/20/2023   Patient will be independent with initial HEP. Baseline: given Goal status: MET 02/14/22   LONG TERM GOALS: Target date: 03/20/2023  Patient will be independent with advanced/ongoing HEP to improve outcomes and carryover.  Baseline:  Goal status: IN PROGRESS  2.  Patient will report at least 75% improvement in R knee pain with activity to improve QOL. Baseline: 5-6/10 sharp pain with twisting movement Goal status: IN PROGRESS- 03/01/23 pt reports 70% improvement  3.  Patient will demonstrate improved functional  LE strength as demonstrated by 5/5 hip extensor strength. Baseline: 3/5 Goal status: IN PROGRESS  4.  Patient will be able to walk for 30 minutes without increased R knee pain.  Baseline: 10 minutes without pain Goal status: IN PROGRESS  5.  Patient will report 9 points improvement on LEFS to demonstrate improved functional ability. Baseline: 67/80 Goal status: IN PROGRESS   PLAN:  PT FREQUENCY: 1-2x/week  PT DURATION: 6 weeks  PLANNED INTERVENTIONS: 97110-Therapeutic exercises, 97530- Therapeutic activity, [id number]- Neuromuscular re-education, 97535- Self Care, [id number]- Manual therapy, [id number]- Gait training, 97014- Electrical stimulation (unattended), [phone]- Electrical stimulation (manual), [id number]- Ultrasound, [id number]- Ionotophoresis 4mg /ml Dexamethasone ,  Patient/Family education, Balance training, Stair training, Taping, Dry Needling, Joint mobilization, Spinal mobilization, Cryotherapy, and Moist heat  PLAN FOR NEXT SESSION: focus on hip mobility and glute strengthening, extremely tight hips and hip flexors, no hip extension during gait.     Almarie JINNY Sprinkles, PT 03/06/2023, 3:56 PM

## 2023-03-08 ENCOUNTER — Ambulatory Visit: Payer: BC Managed Care – PPO

## 2023-03-08 DIAGNOSIS — R293 Abnormal posture: Secondary | ICD-10-CM

## 2023-03-08 DIAGNOSIS — M25561 Pain in right knee: Secondary | ICD-10-CM

## 2023-03-08 DIAGNOSIS — R252 Cramp and spasm: Secondary | ICD-10-CM

## 2023-03-08 DIAGNOSIS — M6281 Muscle weakness (generalized): Secondary | ICD-10-CM

## 2023-03-08 NOTE — Therapy (Signed)
 OUTPATIENT PHYSICAL THERAPY TREATMENT   Patient Name: Anthony Russell MRN: [id number] DOB:[birth year], 43 y.o., male Today's Date: 03/08/2023  END OF SESSION:  PT End of Session - 03/08/23 1105     Visit Number 8    Date for PT Re-Evaluation 03/20/23    Authorization Type [employer]    PT Start Time 1059    PT Stop Time 1140    PT Time Calculation (min) 41 min    Activity Tolerance Patient tolerated treatment well    Behavior During Therapy WFL for tasks assessed/performed                Past Medical History:  Diagnosis Date   Allergy    Allergy, unspecified not elsewhere classified    rx w/ OTC antihistamines PRN   Anxiety    Asthma    Atypical chest pain    recent neg eval w/ normal cxr/ekg   Depression    Diverticulosis    DM (diabetes mellitus) (HCC)    Dyspepsia    on protonix  40mg /d   Esophagitis    GERD (gastroesophageal reflux disease)    on protonix  40mg /d   HTN (hypertension)    Hyperlipidemia    on diet alone   IBS (irritable bowel syndrome)    Lumbar back pain    s/p lumbar laminectomy 2007 by DrCabbell   Migraines    Overweight(278.02)    weight Jan10=246#.SABRA.he was 225# in 9/07...diet and exercise was discussed   Sleep apnea    not wearing c-pap currently   Past Surgical History:  Procedure Laterality Date   DENTAL SURGERY     LUMBAR LAMINECTOMY  01/30/2005   DrCabbell   TEAR DUCT PROBING     Patient Active Problem List   Diagnosis Date Noted   Lumbar radiculopathy 05/10/2022   Capsulitis of left shoulder 05/10/2022   Sinus tachycardia 12/28/2021   Impingement of knee joint, right 12/20/2021   Achilles tendinitis, right leg 12/20/2021   Rotator cuff tendinitis, right 11/04/2021   Encounter for screening for other metabolic disorders 04/07/2021   Dyspnea on exertion 04/07/2021   Migraines 02/23/2021   Lumbar back pain 02/23/2021   HTN (hypertension) 02/23/2021   Dyspepsia 02/23/2021   DM (diabetes mellitus) (HCC) 02/23/2021   Nasal  septal deviation 02/17/2021   Laryngitis 01/19/2021   Asymmetric tonsils 12/01/2020   Subacromial bursitis of right shoulder joint 05/17/2020   Hypersomnia 11/03/2015   Obesity 11/03/2015   Allergic rhinitis 04/15/2014   Asthma in adult 04/15/2014   GERD (gastroesophageal reflux disease) 04/15/2014   IBS (irritable bowel syndrome) 04/01/2013   Nausea alone 01/15/2013   Diarrhea 01/15/2013   Depression 10/15/2012   Lesion of eyebrow 02/09/2012   Gastroparesis 07/05/2011   Anxiety 07/05/2010   TESTOSTERONE  DEFICIENCY 05/26/2008   Hyperlipidemia 02/06/2008   Facet arthropathy, lumbar 02/06/2008   Atypical chest pain 02/06/2008    PCP: Dorina Loving, PA-C   REFERRING PROVIDER: Marcene Benedict ORN, MD  REFERRING DIAG: [phone] (ICD-10-CM) - Patellofemoral pain syndrome of right knee   THERAPY DIAG:  Acute pain of right knee  Muscle weakness (generalized)  Abnormal posture  Cramp and spasm  Rationale for Evaluation and Treatment: Rehabilitation  ONSET DATE: 12/31/2022  SUBJECTIVE:   SUBJECTIVE STATEMENT: Pt denies pain or any new complaints.   PERTINENT HISTORY: Hx R meniscal tear (2020?), hx laminectomy L4-5, DM PAIN:  Are you having pain? Yes: NPRS scale: 0 currently, 6 when crossing leg or bending/10 Pain location: R knee Pain description:  sharp, achey Aggravating factors: certain movements - lifting up to put on shoes/socks, getting into car (twisting of knee) Relieving factors: goes away when stops the movement  PRECAUTIONS: None  RED FLAGS: None   WEIGHT BEARING RESTRICTIONS: No  FALLS:  Has patient fallen in last 6 months? No  LIVING ENVIRONMENT: Lives with: lives alone Lives in: House/apartment Stairs: Yes: External: 2 flights steps; can reach both Has following equipment at home: None  OCCUPATION:  sport and exercise psychologist, works at home; has sit down and stand up desk   PLOF: Independent and Leisure: rock boxing  PATIENT GOALS: no pain, do normal  motion, walk for 30 minutes without pain  NEXT MD VISIT: follow up if needed after PT  OBJECTIVE:  Note: Objective measures were completed at Evaluation unless otherwise noted.  DIAGNOSTIC FINDINGS: no imaging of knee  PATIENT SURVEYS:  LEFS 67/80  COGNITION: Overall cognitive status: Within functional limits for tasks assessed     SENSATION: WFL  EDEMA:  None noted  MUSCLE LENGTH: Hamstrings: significant tightness bil Right ~60 deg; Left ~60 deg Quads - significant tightness bil Hip flexors - significant tightness bil- unable to fully extend hip in prone on R Itbands - significant tightness bil  POSTURE: rounded shoulders, forward head, and increased thoracic kyphosis  PALPATION: Tenderness along R superior patellofemoral tendon  LOWER EXTREMITY ROM:  limited hip internal rotation bil due to tightness   LOWER EXTREMITY MMT:  MMT Right eval Left eval R 03/08/23 L 03/08/23  Hip flexion 5 5    Hip extension 3 3 4 4   Hip abduction 5 5    Hip adduction 5 5    Knee flexion 5 4 5 5   Knee extension 5p! 5 5 5    (Blank rows = not tested)  LOWER EXTREMITY SPECIAL TESTS:  Knee special tests: McMurray's test: negative  GAIT: Distance walked: 200' Assistive device utilized: None Level of assistance: Complete Independence Comments: forward flexed posture, lacking hip extension bilaterally during stance phase.                                                                                                                                TREATMENT DATE:  03/08/23 Therapeutic Exercise: to improve strength and mobility.  Demo, verbal and tactile cues throughout for technique. Elliptical L2 x 5 min  LE MMT Hip openers x 10 R/L Supine R SLR 2# 2x10 Prone quad stretch with strap 2x30  Therapeutic Activity: to improve functional performance. Squats RTB at knees 2x10 Side steps RTB at knees 4x41ft Monster walks RTB at knees 4x44ft Wall squat x 30 Stiff leg deadlift with  dowel 2x10  03/06/23 Therapeutic Exercise: to improve strength and mobility.  Demo, verbal and tactile cues throughout for technique. Elliptical x 5 min for muscle perfusion Single leg squat 2 block 2 x 10 bil 1 UE support Toe raises on wall 2 x 10  Wall squats isometric hold 10 x 10  sec Hip openers 2 x 10 R/L Quad stretch with strap 2 x 1 min  r/l Neuromuscular Reeducation: for posture Prone press ups x 5 Prone on elbows x 1 min Prone hip extension x 10 r/l Mobs to back grade 3-4 thoracic and lumbar Manual Therapy: to decrease muscle spasm and pain and improve mobility IASTM with s/s edge tool to R distal quad and pes anserine mm. Improved knee flexion following,   passive quad stretch.     03/01/23 Therapeutic Exercise: to improve strength and mobility.  Demo, verbal and tactile cues throughout for technique. Bike L3x61min Side lunge with TRX x 10 R/L Conventional slide lunge 2 x 10 R/L S/L clamshell RTB 2 x 10 bil S/L hip abduction 2 x 10 bil Manual Therapy: to decrease muscle spasm and pain and improve mobility Knee flexion with tibia IR, traction and flexion AP mobs for flexion  02/27/23 Therapeutic Exercise: to improve strength and mobility.  Demo, verbal and tactile cues throughout for technique. Elliptical L1 x 5 min At counter for safety -hovering hands  Sliding lunges to side x 10 R/L  Sliding forward lunge x 10 R/L Siding back lunge x 10 R/L  Single leg RDL x 10 R/L  Elephant walks -hands on table.  SKTC stretch Hamstring stretch with strap 2 x 1 min  Manual Therapy: to decrease muscle spasm and pain and improve mobility STM/TPR to R quad, R hamsting Trigger Point Dry Needling  Subsequent Treatment: Instructions provided previously at initial dry needling treatment.   Patient Verbal Consent Given: Yes Education Handout Provided: Previously Provided Muscles Treated: R rectus femoris, R lateral hamstring Electrical Stimulation Performed: Yes, Parameters: R quad  30mA enough for palpable twitch Treatment Response/Outcome: Twitch Response Elicited and Palpable Increase in Muscle Length   02/22/23 Therapeutic Exercise: to improve strength and mobility.  Demo, verbal and tactile cues throughout for technique. Elliptical L1 x 4 min At counter for safety with 1 UE support Sliding lunges to side x 10 R/L  Sliding forward lunge x 10 R/L Siding back lunge x 10 R/L  Single leg RDL x 10 R/L  Elevated ATG split squat knee stretch  Squats x 10 Squats x 10 with elevated heels - easier Hip flexor stretch in supine x 1 min  Prone quad stretch 2 x 1 min each Supine thoracic mob on foam roller - back stroke x 10 HEP update   02/15/2023 Therapeutic Exercise: to improve strength and mobility.  Demo, verbal and tactile cues throughout for technique. Bike L2 x 6 min -reported knee pain Star excursion - side, back and cross step Lunges x 10 R/L - challenging.  Single leg RDLs x 10 R/L Modified thomas stretch for hip flexors x 1 min R/L Prone quad stretch with strap - R pre and post manual therapy Manual Therapy: to decrease muscle spasm and pain and improve mobility Quad stretch in prone with progressive stretch using contract relax, STM/TPR to R patellar tendon/distal rectus femoris, skilled palpation and monitoring during dry needling. Trigger Point Dry Needling  Subsequent Treatment: Instructions provided previously at initial dry needling treatment.   Patient Verbal Consent Given: Yes Education Handout Provided: Yes updated copy given, reviewed how to decrease soreness following TrDN Muscles Treated: distal rectus femoris Electrical Stimulation Performed: No Treatment Response/Outcome: Palpable Increase in Muscle Length   PATIENT EDUCATION:  Education details: HEP update Person educated: Patient Education method: Explanation, Demonstration, and Handouts Education comprehension: verbalized understanding and returned demonstration  HOME EXERCISE  PROGRAM: Access  Code: [email or web address] URL: [email or web address] Date: 03/06/2023 Prepared by: Almarie Sprinkles  Exercises - Modified Thomas Stretch  - 1 x daily - 7 x weekly - 1 sets - 3 reps - 30-45 sec hold - Hip Flexor Stretch with Chair  - 1 x daily - 7 x weekly - 1 sets - 3 reps - 30-45 sec hold - Supine Hamstring Stretch  - 1 x daily - 7 x weekly - 1 sets - 3 reps - 30-45 sec hold - Supine Hamstring Stretch with Strap  - 1 x daily - 7 x weekly - 1 sets - 3 reps - 30-45 sec hold - Supine ITB Stretch with Strap  - 1 x daily - 7 x weekly - 1 sets - 3 reps - 30-45 sec hold - Hip Adductors and Hamstring Stretch with Strap  - 1 x daily - 7 x weekly - 1 sets - 3 reps - 30-45 sec hold - Seated Hamstring Stretch  - 1 x daily - 7 x weekly - 1 sets - 3 reps - 30-45 sec hold - Double Leg Hamstring Stretch at Wall  - 1 x daily - 7 x weekly - 1 sets - 1 reps - 3-5 min hold - Single Leg Balance with Clock Reach  - 1 x daily - 7 x weekly - 1 sets - 10 reps - Forward T with Counter Support  - 1 x daily - 7 x weekly - 1 sets - 10 reps - Prone Quadriceps Stretch with Strap  - 1 x daily - 7 x weekly - 1 sets - 3 reps - 30 sec hold - Modified Thomas Stretch  - 1 x daily - 7 x weekly - 3 sets - 10 reps - Lunge Matrix on Slider  - 1 x daily - 7 x weekly - 3 sets - 10 reps - Thoracic Foam Roll Mobilization Backstroke  - 1 x daily - 7 x weekly - 3 sets - 10 reps - Toe Raise With Back Against Wall  - 1 x daily - 7 x weekly - 3 sets - 10 reps - Wall Squat  - 1 x daily - 7 x weekly - 3 sets - 10 reps - Lower Quarter Anterior Reach  - 1 x daily - 7 x weekly - 3 sets - 10 reps  ASSESSMENT:  CLINICAL IMPRESSION: Progressed with interventions to improve functional strength of R knee. Pt is showing good demo of squats with no pain. He no longer has pain with resisted R knee extension and shows improved hip extension strength. He is continuing to show progress. Anthony Russell continues to demonstrate  potential for improvement and would benefit from continued skilled therapy to address impairments.      OBJECTIVE IMPAIRMENTS: Abnormal gait, decreased activity tolerance, difficulty walking, decreased ROM, decreased strength, hypomobility, increased fascial restrictions, increased muscle spasms, impaired flexibility, postural dysfunction, and pain.   ACTIVITY LIMITATIONS: bending, sitting, transfers, dressing, and locomotion level  PARTICIPATION LIMITATIONS: community activity  PERSONAL FACTORS: Past/current experiences and 1-2 comorbidities: Hx R meniscal tear, hx laminectomy L4-5, DM  are also affecting patient's functional outcome.   REHAB POTENTIAL: Good  CLINICAL DECISION MAKING: Evolving/moderate complexity  EVALUATION COMPLEXITY: Moderate   GOALS: Goals reviewed with patient? Yes  SHORT TERM GOALS: Target date: 02/20/2023   Patient will be independent with initial HEP. Baseline: given Goal status: MET 02/14/22   LONG TERM GOALS: Target date: 03/20/2023  Patient will be independent with advanced/ongoing HEP to improve outcomes and carryover.  Baseline:  Goal status: IN PROGRESS  2.  Patient will report at least 75% improvement in R knee pain with activity to improve QOL. Baseline: 5-6/10 sharp pain with twisting movement Goal status: IN PROGRESS- 03/01/23 pt reports 70% improvement  3.  Patient will demonstrate improved functional LE strength as demonstrated by 5/5 hip extensor strength. Baseline: 3/5 Goal status: IN PROGRESS-03/08/23  4.  Patient will be able to walk for 30 minutes without increased R knee pain.  Baseline: 10 minutes without pain Goal status: IN PROGRESS  5.  Patient will report 9 points improvement on LEFS to demonstrate improved functional ability. Baseline: 67/80 Goal status: IN PROGRESS   PLAN:  PT FREQUENCY: 1-2x/week  PT DURATION: 6 weeks  PLANNED INTERVENTIONS: 97110-Therapeutic exercises, 97530- Therapeutic activity, [id number]-  Neuromuscular re-education, 97535- Self Care, 02859- Manual therapy, [id number]- Gait training, [id number]- Electrical stimulation (unattended), [phone]- Electrical stimulation (manual), [id number]- Ultrasound, 02966- Ionotophoresis 4mg /ml Dexamethasone , Patient/Family education, Balance training, Stair training, Taping, Dry Needling, Joint mobilization, Spinal mobilization, Cryotherapy, and Moist heat  PLAN FOR NEXT SESSION: focus on hip mobility and glute strengthening, extremely tight hips and hip flexors, no hip extension during gait.     Giulio Bertino L Makahla Kiser, PTA 03/08/2023, 11:45 AM

## 2023-03-13 ENCOUNTER — Ambulatory Visit: Payer: BC Managed Care – PPO

## 2023-03-13 DIAGNOSIS — M6281 Muscle weakness (generalized): Secondary | ICD-10-CM | POA: Diagnosis not present

## 2023-03-13 DIAGNOSIS — M25561 Pain in right knee: Secondary | ICD-10-CM

## 2023-03-13 DIAGNOSIS — R252 Cramp and spasm: Secondary | ICD-10-CM

## 2023-03-13 DIAGNOSIS — R293 Abnormal posture: Secondary | ICD-10-CM | POA: Diagnosis not present

## 2023-03-13 NOTE — Therapy (Signed)
OUTPATIENT PHYSICAL THERAPY TREATMENT   Patient Name: Anthony Russell MRN: [id number] DOB:1980-04-07, 43 y.o., male Today's Date: 03/13/2023  END OF SESSION:  PT End of Session - 03/13/23 1416     Visit Number 9    Date for PT Re-Evaluation 03/20/23    Authorization Type [employer]    PT Start Time 1404    PT Stop Time 1445    PT Time Calculation (min) 41 min    Activity Tolerance Patient tolerated treatment well    Behavior During Therapy WFL for tasks assessed/performed                 Past Medical History:  Diagnosis Date   Allergy    Allergy, unspecified not elsewhere classified    rx w/ OTC antihistamines PRN   Anxiety    Asthma    Atypical chest pain    recent neg eval w/ normal cxr/ekg   Depression    Diverticulosis    DM (diabetes mellitus) (HCC)    Dyspepsia    on protonix 40mg /d   Esophagitis    GERD (gastroesophageal reflux disease)    on protonix 40mg /d   HTN (hypertension)    Hyperlipidemia    on diet alone   IBS (irritable bowel syndrome)    Lumbar back pain    s/p lumbar laminectomy 2007 by DrCabbell   Migraines    Overweight(278.02)    weight Jan10=246#.[name].he was 225# in 9/07...diet and exercise was discussed   Sleep apnea    not wearing c-pap currently   Past Surgical History:  Procedure Laterality Date   DENTAL SURGERY     LUMBAR LAMINECTOMY  01/30/2005   DrCabbell   TEAR DUCT PROBING     Patient Active Problem List   Diagnosis Date Noted   Lumbar radiculopathy 05/10/2022   Capsulitis of left shoulder 05/10/2022   Sinus tachycardia 12/28/2021   Impingement of knee joint, right 12/20/2021   Achilles tendinitis, right leg 12/20/2021   Rotator cuff tendinitis, right 11/04/2021   Encounter for screening for other metabolic disorders 04/07/2021   Dyspnea on exertion 04/07/2021   Migraines 02/23/2021   Lumbar back pain 02/23/2021   HTN (hypertension) 02/23/2021   Dyspepsia 02/23/2021   DM (diabetes mellitus) (HCC) 02/23/2021   Nasal  septal deviation 02/17/2021   Laryngitis 01/19/2021   Asymmetric tonsils 12/01/2020   Subacromial bursitis of right shoulder joint 05/17/2020   Hypersomnia 11/03/2015   Obesity 11/03/2015   Allergic rhinitis 04/15/2014   Asthma in adult 04/15/2014   GERD (gastroesophageal reflux disease) 04/15/2014   IBS (irritable bowel syndrome) 04/01/2013   Nausea alone 01/15/2013   Diarrhea 01/15/2013   Depression 10/15/2012   Lesion of eyebrow 02/09/2012   Gastroparesis 07/05/2011   Anxiety 07/05/2010   TESTOSTERONE DEFICIENCY 05/26/2008   Hyperlipidemia 02/06/2008   Facet arthropathy, lumbar 02/06/2008   Atypical chest pain 02/06/2008    PCP: Esperanza Richters, PA-C   REFERRING PROVIDER: Burna Forts, MD  REFERRING DIAG: [phone] (ICD-10-CM) - Patellofemoral pain syndrome of right knee   THERAPY DIAG:  Acute pain of right knee  Muscle weakness (generalized)  Abnormal posture  Cramp and spasm  Rationale for Evaluation and Treatment: Rehabilitation  ONSET DATE: 12/31/2022  SUBJECTIVE:   SUBJECTIVE STATEMENT: Doing well.  PERTINENT HISTORY: Hx R meniscal tear (2020?), hx laminectomy L4-5, DM PAIN:  Are you having pain? Yes: NPRS scale: 0/10 Pain location: R knee Pain description: sharp, achey Aggravating factors: certain movements - lifting up to put on  shoes/socks, getting into car (twisting of knee) Relieving factors: goes away when stops the movement  PRECAUTIONS: None  RED FLAGS: None   WEIGHT BEARING RESTRICTIONS: No  FALLS:  Has patient fallen in last 6 months? No  LIVING ENVIRONMENT: Lives with: lives alone Lives in: House/apartment Stairs: Yes: External: 2 flights steps; can reach both Has following equipment at home: None  OCCUPATION:  Sport and exercise psychologist, works at home; has sit down and stand up desk   PLOF: Independent and Leisure: rock boxing  PATIENT GOALS: no pain, do normal motion, walk for 30 minutes without pain  NEXT MD VISIT: follow up if  needed after PT  OBJECTIVE:  Note: Objective measures were completed at Evaluation unless otherwise noted.  DIAGNOSTIC FINDINGS: no imaging of knee  PATIENT SURVEYS:  LEFS 67/80  COGNITION: Overall cognitive status: Within functional limits for tasks assessed     SENSATION: WFL  EDEMA:  None noted  MUSCLE LENGTH: Hamstrings: significant tightness bil Right ~60 deg; Left ~60 deg Quads - significant tightness bil Hip flexors - significant tightness bil- unable to fully extend hip in prone on R Itbands - significant tightness bil  POSTURE: rounded shoulders, forward head, and increased thoracic kyphosis  PALPATION: Tenderness along R superior patellofemoral tendon  LOWER EXTREMITY ROM:  limited hip internal rotation bil due to tightness   LOWER EXTREMITY MMT:  MMT Right eval Left eval R 03/08/23 L 03/08/23  Hip flexion 5 5    Hip extension 3 3 4 4   Hip abduction 5 5    Hip adduction 5 5    Knee flexion 5 4 5 5   Knee extension 5p! 5 5 5    (Blank rows = not tested)  LOWER EXTREMITY SPECIAL TESTS:  Knee special tests: McMurray's test: negative  GAIT: Distance walked: 200' Assistive device utilized: None Level of assistance: Complete Independence Comments: forward flexed posture, lacking hip extension bilaterally during stance phase.                                                                                                                                TREATMENT DATE:  03/13/23 Therapeutic Exercise: to improve strength and mobility.  Demo, verbal and tactile cues throughout for technique. Elliptical L2 x 5 min  Knee flexion 25# 2x15 BLE Knee extension 10# 2x10 BLE Quad stretch Hip ADD ball squeeze Therapeutic Activity: to improve functional performance. Side steps RTB at ankles 4x7ft Monster walks RTB at ankles 4x26ft Stiff leg deadlift with dowel x 10  Slide lunges lateral 2 x 10 R/L Slide fwd lunges 2 x 10 R/L  03/08/23 Therapeutic Exercise: to  improve strength and mobility.  Demo, verbal and tactile cues throughout for technique. Elliptical L2 x 5 min  LE MMT Hip openers x 10 R/L Supine R SLR 2# 2x10 Prone quad stretch with strap 2x30"  Therapeutic Activity: to improve functional performance. Squats RTB at knees 2x10 Side steps RTB at knees 4x58ft  Monster walks RTB at knees 4x68ft Wall squat x 30" Stiff leg deadlift with dowel 2x10  03/06/23 Therapeutic Exercise: to improve strength and mobility.  Demo, verbal and tactile cues throughout for technique. Elliptical x 5 min for muscle perfusion Single leg squat 2" block 2 x 10 bil 1 UE support Toe raises on wall 2 x 10  Wall squats isometric hold 10 x 10 sec Hip openers 2 x 10 R/L Quad stretch with strap 2 x 1 min  r/l Neuromuscular Reeducation: for posture Prone press ups x 5 Prone on elbows x 1 min Prone hip extension x 10 r/l Mobs to back grade 3-4 thoracic and lumbar Manual Therapy: to decrease muscle spasm and pain and improve mobility IASTM with s/s edge tool to R distal quad and pes anserine mm. Improved knee flexion following,   passive quad stretch.     03/01/23 Therapeutic Exercise: to improve strength and mobility.  Demo, verbal and tactile cues throughout for technique. Bike L3x39min Side lunge with TRX x 10 R/L Conventional slide lunge 2 x 10 R/L S/L clamshell RTB 2 x 10 bil S/L hip abduction 2 x 10 bil Manual Therapy: to decrease muscle spasm and pain and improve mobility Knee flexion with tibia IR, traction and flexion AP mobs for flexion  02/27/23 Therapeutic Exercise: to improve strength and mobility.  Demo, verbal and tactile cues throughout for technique. Elliptical L1 x 5 min At counter for safety -hovering hands  Sliding lunges to side x 10 R/L  Sliding forward lunge x 10 R/L Siding back lunge x 10 R/L  Single leg RDL x 10 R/L  Elephant walks -hands on table.  SKTC stretch Hamstring stretch with strap 2 x 1 min  Manual Therapy: to decrease  muscle spasm and pain and improve mobility STM/TPR to R quad, R hamsting Trigger Point Dry Needling  Subsequent Treatment: Instructions provided previously at initial dry needling treatment.   Patient Verbal Consent Given: Yes Education Handout Provided: Previously Provided Muscles Treated: R rectus femoris, R lateral hamstring Electrical Stimulation Performed: Yes, Parameters: R quad 30mA enough for palpable twitch Treatment Response/Outcome: Twitch Response Elicited and Palpable Increase in Muscle Length   02/22/23 Therapeutic Exercise: to improve strength and mobility.  Demo, verbal and tactile cues throughout for technique. Elliptical L1 x 4 min At counter for safety with 1 UE support Sliding lunges to side x 10 R/L  Sliding forward lunge x 10 R/L Siding back lunge x 10 R/L  Single leg RDL x 10 R/L  Elevated ATG split squat knee stretch  Squats x 10 Squats x 10 with elevated heels - easier Hip flexor stretch in supine x 1 min  Prone quad stretch 2 x 1 min each Supine thoracic mob on foam roller - back stroke x 10 HEP update   02/15/2023 Therapeutic Exercise: to improve strength and mobility.  Demo, verbal and tactile cues throughout for technique. Bike L2 x 6 min -reported knee pain Star excursion - side, back and cross step Lunges x 10 R/L - challenging.  Single leg RDLs x 10 R/L Modified thomas stretch for hip flexors x 1 min R/L Prone quad stretch with strap - R pre and post manual therapy Manual Therapy: to decrease muscle spasm and pain and improve mobility Quad stretch in prone with progressive stretch using contract relax, STM/TPR to R patellar tendon/distal rectus femoris, skilled palpation and monitoring during dry needling. Trigger Point Dry Needling  Subsequent Treatment: Instructions provided previously at initial dry needling treatment.  Patient Verbal Consent Given: Yes Education Handout Provided: Yes updated copy given, reviewed how to decrease soreness  following TrDN Muscles Treated: distal rectus femoris Electrical Stimulation Performed: No Treatment Response/Outcome: Palpable Increase in Muscle Length   PATIENT EDUCATION:  Education details: HEP update Person educated: Patient Education method: Explanation, Demonstration, and Handouts Education comprehension: verbalized understanding and returned demonstration  HOME EXERCISE PROGRAM: Access Code: [email or web address] URL: [email or web address] Date: 03/06/2023 Prepared by: Harrie Foreman  Exercises - Modified Thomas Stretch  - 1 x daily - 7 x weekly - 1 sets - 3 reps - 30-45 sec hold - Hip Flexor Stretch with Chair  - 1 x daily - 7 x weekly - 1 sets - 3 reps - 30-45 sec hold - Supine Hamstring Stretch  - 1 x daily - 7 x weekly - 1 sets - 3 reps - 30-45 sec hold - Supine Hamstring Stretch with Strap  - 1 x daily - 7 x weekly - 1 sets - 3 reps - 30-45 sec hold - Supine ITB Stretch with Strap  - 1 x daily - 7 x weekly - 1 sets - 3 reps - 30-45 sec hold - Hip Adductors and Hamstring Stretch with Strap  - 1 x daily - 7 x weekly - 1 sets - 3 reps - 30-45 sec hold - Seated Hamstring Stretch  - 1 x daily - 7 x weekly - 1 sets - 3 reps - 30-45 sec hold - Double Leg Hamstring Stretch at Wall  - 1 x daily - 7 x weekly - 1 sets - 1 reps - 3-5 min hold - Single Leg Balance with Clock Reach  - 1 x daily - 7 x weekly - 1 sets - 10 reps - Forward T with Counter Support  - 1 x daily - 7 x weekly - 1 sets - 10 reps - Prone Quadriceps Stretch with Strap  - 1 x daily - 7 x weekly - 1 sets - 3 reps - 30 sec hold - Modified Thomas Stretch  - 1 x daily - 7 x weekly - 3 sets - 10 reps - Lunge Matrix on Slider  - 1 x daily - 7 x weekly - 3 sets - 10 reps - Thoracic Foam Roll Mobilization Backstroke  - 1 x daily - 7 x weekly - 3 sets - 10 reps - Toe Raise With Back Against Wall  - 1 x daily - 7 x weekly - 3 sets - 10 reps - Wall Squat  - 1 x daily - 7 x weekly - 3 sets - 10 reps - Lower Quarter  Anterior Reach  - 1 x daily - 7 x weekly - 3 sets - 10 reps  ASSESSMENT:  CLINICAL IMPRESSION: Progressed with interventions to improve functional strength of R knee. Cues required to correct form with side lunges and the banded walk exercises. Pt with no complaints, continuing to show progress. PAMELA MADDY continues to demonstrate potential for improvement and would benefit from continued skilled therapy to address impairments.      OBJECTIVE IMPAIRMENTS: Abnormal gait, decreased activity tolerance, difficulty walking, decreased ROM, decreased strength, hypomobility, increased fascial restrictions, increased muscle spasms, impaired flexibility, postural dysfunction, and pain.   ACTIVITY LIMITATIONS: bending, sitting, transfers, dressing, and locomotion level  PARTICIPATION LIMITATIONS: community activity  PERSONAL FACTORS: Past/current experiences and 1-2 comorbidities: Hx R meniscal tear, hx laminectomy L4-5, DM  are also affecting patient's functional outcome.   REHAB POTENTIAL: Good  CLINICAL DECISION MAKING: Evolving/moderate complexity  EVALUATION COMPLEXITY: Moderate   GOALS: Goals reviewed with patient? Yes  SHORT TERM GOALS: Target date: 02/20/2023   Patient will be independent with initial HEP. Baseline: given Goal status: MET 02/14/22   LONG TERM GOALS: Target date: 03/20/2023  Patient will be independent with advanced/ongoing HEP to improve outcomes and carryover.  Baseline:  Goal status: IN PROGRESS  2.  Patient will report at least 75% improvement in R knee pain with activity to improve QOL. Baseline: 5-6/10 sharp pain with twisting movement Goal status: IN PROGRESS- 03/01/23 pt reports 70% improvement  3.  Patient will demonstrate improved functional LE strength as demonstrated by 5/5 hip extensor strength. Baseline: 3/5 Goal status: IN PROGRESS-03/08/23  4.  Patient will be able to walk for 30 minutes without increased R knee pain.  Baseline: 10 minutes  without pain Goal status: IN PROGRESS  5.  Patient will report 9 points improvement on LEFS to demonstrate improved functional ability. Baseline: 67/80 Goal status: IN PROGRESS   PLAN:  PT FREQUENCY: 1-2x/week  PT DURATION: 6 weeks  PLANNED INTERVENTIONS: 97110-Therapeutic exercises, 97530- Therapeutic activity, [id number]- Neuromuscular re-education, 97535- Self Care, [id number]- Manual therapy, [id number]- Gait training, [id number]- Electrical stimulation (unattended), [phone]- Electrical stimulation (manual), [id number]- Ultrasound, 82956- Ionotophoresis 4mg /ml Dexamethasone, Patient/Family education, Balance training, Stair training, Taping, Dry Needling, Joint mobilization, Spinal mobilization, Cryotherapy, and Moist heat  PLAN FOR NEXT SESSION: focus on hip mobility and glute strengthening, extremely tight hips and hip flexors, no hip extension during gait.     Darleene Cleaver, PTA 03/13/2023, 2:45 PM

## 2023-03-15 ENCOUNTER — Ambulatory Visit: Payer: BC Managed Care – PPO

## 2023-03-15 DIAGNOSIS — M6281 Muscle weakness (generalized): Secondary | ICD-10-CM

## 2023-03-15 DIAGNOSIS — M25561 Pain in right knee: Secondary | ICD-10-CM | POA: Diagnosis not present

## 2023-03-15 DIAGNOSIS — R252 Cramp and spasm: Secondary | ICD-10-CM | POA: Diagnosis not present

## 2023-03-15 DIAGNOSIS — R293 Abnormal posture: Secondary | ICD-10-CM | POA: Diagnosis not present

## 2023-03-15 NOTE — Therapy (Signed)
OUTPATIENT PHYSICAL THERAPY TREATMENT   Patient Name: Anthony Russell MRN: [id number] DOB:06/01/1980, 43 y.o., male Today's Date: 03/15/2023  END OF SESSION:  PT End of Session - 03/15/23 1543     Visit Number 10    Date for PT Re-Evaluation 03/20/23    Authorization Type [employer]    PT Start Time 1534    PT Stop Time 1617    PT Time Calculation (min) 43 min    Activity Tolerance Patient tolerated treatment well    Behavior During Therapy WFL for tasks assessed/performed                  Past Medical History:  Diagnosis Date   Allergy    Allergy, unspecified not elsewhere classified    rx w/ OTC antihistamines PRN   Anxiety    Asthma    Atypical chest pain    recent neg eval w/ normal cxr/ekg   Depression    Diverticulosis    DM (diabetes mellitus) (HCC)    Dyspepsia    on protonix 40mg /d   Esophagitis    GERD (gastroesophageal reflux disease)    on protonix 40mg /d   HTN (hypertension)    Hyperlipidemia    on diet alone   IBS (irritable bowel syndrome)    Lumbar back pain    s/p lumbar laminectomy 2007 by DrCabbell   Migraines    Overweight(278.02)    weight Jan10=246#.[name].he was 225# in 9/07...diet and exercise was discussed   Sleep apnea    not wearing c-pap currently   Past Surgical History:  Procedure Laterality Date   DENTAL SURGERY     LUMBAR LAMINECTOMY  01/30/2005   DrCabbell   TEAR DUCT PROBING     Patient Active Problem List   Diagnosis Date Noted   Lumbar radiculopathy 05/10/2022   Capsulitis of left shoulder 05/10/2022   Sinus tachycardia 12/28/2021   Impingement of knee joint, right 12/20/2021   Achilles tendinitis, right leg 12/20/2021   Rotator cuff tendinitis, right 11/04/2021   Encounter for screening for other metabolic disorders 04/07/2021   Dyspnea on exertion 04/07/2021   Migraines 02/23/2021   Lumbar back pain 02/23/2021   HTN (hypertension) 02/23/2021   Dyspepsia 02/23/2021   DM (diabetes mellitus) (HCC) 02/23/2021    Nasal septal deviation 02/17/2021   Laryngitis 01/19/2021   Asymmetric tonsils 12/01/2020   Subacromial bursitis of right shoulder joint 05/17/2020   Hypersomnia 11/03/2015   Obesity 11/03/2015   Allergic rhinitis 04/15/2014   Asthma in adult 04/15/2014   GERD (gastroesophageal reflux disease) 04/15/2014   IBS (irritable bowel syndrome) 04/01/2013   Nausea alone 01/15/2013   Diarrhea 01/15/2013   Depression 10/15/2012   Lesion of eyebrow 02/09/2012   Gastroparesis 07/05/2011   Anxiety 07/05/2010   TESTOSTERONE DEFICIENCY 05/26/2008   Hyperlipidemia 02/06/2008   Facet arthropathy, lumbar 02/06/2008   Atypical chest pain 02/06/2008    PCP: Esperanza Richters, PA-C   REFERRING PROVIDER: Burna Forts, MD  REFERRING DIAG: [phone] (ICD-10-CM) - Patellofemoral pain syndrome of right knee   THERAPY DIAG:  Acute pain of right knee  Muscle weakness (generalized)  Abnormal posture  Rationale for Evaluation and Treatment: Rehabilitation  ONSET DATE: 12/31/2022  SUBJECTIVE:   SUBJECTIVE STATEMENT: Pt reports no issues.  PERTINENT HISTORY: Hx R meniscal tear (2020?), hx laminectomy L4-5, DM PAIN:  Are you having pain? Yes: NPRS scale: 0/10 Pain location: R knee Pain description: sharp, achey Aggravating factors: certain movements - lifting up to put on shoes/socks,  getting into car (twisting of knee) Relieving factors: goes away when stops the movement  PRECAUTIONS: None  RED FLAGS: None   WEIGHT BEARING RESTRICTIONS: No  FALLS:  Has patient fallen in last 6 months? No  LIVING ENVIRONMENT: Lives with: lives alone Lives in: House/apartment Stairs: Yes: External: 2 flights steps; can reach both Has following equipment at home: None  OCCUPATION:  Sport and exercise psychologist, works at home; has sit down and stand up desk   PLOF: Independent and Leisure: rock boxing  PATIENT GOALS: no pain, do normal motion, walk for 30 minutes without pain  NEXT MD VISIT: follow up if  needed after PT  OBJECTIVE:  Note: Objective measures were completed at Evaluation unless otherwise noted.  DIAGNOSTIC FINDINGS: no imaging of knee  PATIENT SURVEYS:  LEFS 67/80  COGNITION: Overall cognitive status: Within functional limits for tasks assessed     SENSATION: WFL  EDEMA:  None noted  MUSCLE LENGTH: Hamstrings: significant tightness bil Right ~60 deg; Left ~60 deg Quads - significant tightness bil Hip flexors - significant tightness bil- unable to fully extend hip in prone on R Itbands - significant tightness bil  POSTURE: rounded shoulders, forward head, and increased thoracic kyphosis  PALPATION: Tenderness along R superior patellofemoral tendon  LOWER EXTREMITY ROM:  limited hip internal rotation bil due to tightness   LOWER EXTREMITY MMT:  MMT Right eval Left eval R 03/08/23 L 03/08/23  Hip flexion 5 5    Hip extension 3 3 4 4   Hip abduction 5 5    Hip adduction 5 5    Knee flexion 5 4 5 5   Knee extension 5p! 5 5 5    (Blank rows = not tested)  LOWER EXTREMITY SPECIAL TESTS:  Knee special tests: McMurray's test: negative  GAIT: Distance walked: 200' Assistive device utilized: None Level of assistance: Complete Independence Comments: forward flexed posture, lacking hip extension bilaterally during stance phase.                                                                                                                                TREATMENT DATE:  03/15/23 Therapeutic Exercise: to improve strength and mobility.  Demo, verbal and tactile cues throughout for technique. Elliptical L2 x 5 min Knee flexion 25# 2x15 Knee extension 10# x 10 BLE; 5# x 10 RLE Supine HS curls orange pball x 10; trunk rotation x 10    Therapeutic Activity: to improve functional performance. Leg press 25# 3x10 BLE Squats with 10lb kettle bell 2x10 Kettle bell swings 10# 2x10 Side steps GTB at ankles 4x66ft Monster walks GTB at ankles 4x4ft- cues to keep  weight through entire foot  03/13/23 Therapeutic Exercise: to improve strength and mobility.  Demo, verbal and tactile cues throughout for technique. Elliptical L2 x 5 min  Knee flexion 25# 2x15 BLE Knee extension 10# 2x10 BLE Quad stretch Hip ADD ball squeeze Therapeutic Activity: to improve functional performance. Side steps RTB  at ankles 4x92ft Monster walks RTB at ankles 4x69ft Stiff leg deadlift with dowel x 10  Slide lunges lateral 2 x 10 R/L Slide fwd lunges 2 x 10 R/L  03/08/23 Therapeutic Exercise: to improve strength and mobility.  Demo, verbal and tactile cues throughout for technique. Elliptical L2 x 5 min  LE MMT Hip openers x 10 R/L Supine R SLR 2# 2x10 Prone quad stretch with strap 2x30"  Therapeutic Activity: to improve functional performance. Squats RTB at knees 2x10 Side steps RTB at knees 4x60ft Monster walks RTB at knees 4x28ft Wall squat x 30" Stiff leg deadlift with dowel 2x10  03/06/23 Therapeutic Exercise: to improve strength and mobility.  Demo, verbal and tactile cues throughout for technique. Elliptical x 5 min for muscle perfusion Single leg squat 2" block 2 x 10 bil 1 UE support Toe raises on wall 2 x 10  Wall squats isometric hold 10 x 10 sec Hip openers 2 x 10 R/L Quad stretch with strap 2 x 1 min  r/l Neuromuscular Reeducation: for posture Prone press ups x 5 Prone on elbows x 1 min Prone hip extension x 10 r/l Mobs to back grade 3-4 thoracic and lumbar Manual Therapy: to decrease muscle spasm and pain and improve mobility IASTM with s/s edge tool to R distal quad and pes anserine mm. Improved knee flexion following,   passive quad stretch.     03/01/23 Therapeutic Exercise: to improve strength and mobility.  Demo, verbal and tactile cues throughout for technique. Bike L3x75min Side lunge with TRX x 10 R/L Conventional slide lunge 2 x 10 R/L S/L clamshell RTB 2 x 10 bil S/L hip abduction 2 x 10 bil Manual Therapy: to decrease muscle spasm  and pain and improve mobility Knee flexion with tibia IR, traction and flexion AP mobs for flexion  02/27/23 Therapeutic Exercise: to improve strength and mobility.  Demo, verbal and tactile cues throughout for technique. Elliptical L1 x 5 min At counter for safety -hovering hands  Sliding lunges to side x 10 R/L  Sliding forward lunge x 10 R/L Siding back lunge x 10 R/L  Single leg RDL x 10 R/L  Elephant walks -hands on table.  SKTC stretch Hamstring stretch with strap 2 x 1 min  Manual Therapy: to decrease muscle spasm and pain and improve mobility STM/TPR to R quad, R hamsting Trigger Point Dry Needling  Subsequent Treatment: Instructions provided previously at initial dry needling treatment.   Patient Verbal Consent Given: Yes Education Handout Provided: Previously Provided Muscles Treated: R rectus femoris, R lateral hamstring Electrical Stimulation Performed: Yes, Parameters: R quad 30mA enough for palpable twitch Treatment Response/Outcome: Twitch Response Elicited and Palpable Increase in Muscle Length   02/22/23 Therapeutic Exercise: to improve strength and mobility.  Demo, verbal and tactile cues throughout for technique. Elliptical L1 x 4 min At counter for safety with 1 UE support Sliding lunges to side x 10 R/L  Sliding forward lunge x 10 R/L Siding back lunge x 10 R/L  Single leg RDL x 10 R/L  Elevated ATG split squat knee stretch  Squats x 10 Squats x 10 with elevated heels - easier Hip flexor stretch in supine x 1 min  Prone quad stretch 2 x 1 min each Supine thoracic mob on foam roller - back stroke x 10 HEP update   02/15/2023 Therapeutic Exercise: to improve strength and mobility.  Demo, verbal and tactile cues throughout for technique. Bike L2 x 6 min -reported knee pain Star excursion -  side, back and cross step Lunges x 10 R/L - challenging.  Single leg RDLs x 10 R/L Modified thomas stretch for hip flexors x 1 min R/L Prone quad stretch with strap -  R pre and post manual therapy Manual Therapy: to decrease muscle spasm and pain and improve mobility Quad stretch in prone with progressive stretch using contract relax, STM/TPR to R patellar tendon/distal rectus femoris, skilled palpation and monitoring during dry needling. Trigger Point Dry Needling  Subsequent Treatment: Instructions provided previously at initial dry needling treatment.   Patient Verbal Consent Given: Yes Education Handout Provided: Yes updated copy given, reviewed how to decrease soreness following TrDN Muscles Treated: distal rectus femoris Electrical Stimulation Performed: No Treatment Response/Outcome: Palpable Increase in Muscle Length   PATIENT EDUCATION:  Education details: HEP update- monster walk and sidestep Person educated: Patient Education method: Explanation, Demonstration, and Handouts Education comprehension: verbalized understanding and returned demonstration  HOME EXERCISE PROGRAM: Access Code: [email or web address] URL: [email or web address] Date: 03/15/2023 Prepared by: Verta Ellen  Exercises - Modified Thomas Stretch  - 1 x daily - 7 x weekly - 1 sets - 3 reps - 30-45 sec hold - Hip Flexor Stretch with Chair  - 1 x daily - 7 x weekly - 1 sets - 3 reps - 30-45 sec hold - Supine Hamstring Stretch  - 1 x daily - 7 x weekly - 1 sets - 3 reps - 30-45 sec hold - Supine Hamstring Stretch with Strap  - 1 x daily - 7 x weekly - 1 sets - 3 reps - 30-45 sec hold - Supine ITB Stretch with Strap  - 1 x daily - 7 x weekly - 1 sets - 3 reps - 30-45 sec hold - Hip Adductors and Hamstring Stretch with Strap  - 1 x daily - 7 x weekly - 1 sets - 3 reps - 30-45 sec hold - Seated Hamstring Stretch  - 1 x daily - 7 x weekly - 1 sets - 3 reps - 30-45 sec hold - Double Leg Hamstring Stretch at Wall  - 1 x daily - 7 x weekly - 1 sets - 1 reps - 3-5 min hold - Single Leg Balance with Clock Reach  - 1 x daily - 7 x weekly - 1 sets - 10 reps - Forward T with  Counter Support  - 1 x daily - 7 x weekly - 1 sets - 10 reps - Prone Quadriceps Stretch with Strap  - 1 x daily - 7 x weekly - 1 sets - 3 reps - 30 sec hold - Modified Thomas Stretch  - 1 x daily - 7 x weekly - 3 sets - 10 reps - Lunge Matrix on Slider  - 1 x daily - 7 x weekly - 3 sets - 10 reps - Thoracic Foam Roll Mobilization Backstroke  - 1 x daily - 7 x weekly - 3 sets - 10 reps - Toe Raise With Back Against Wall  - 1 x daily - 7 x weekly - 3 sets - 10 reps - Wall Squat  - 1 x daily - 7 x weekly - 3 sets - 10 reps - Lower Quarter Anterior Reach  - 1 x daily - 7 x weekly - 3 sets - 10 reps - Side Stepping with Resistance at Ankles  - 1 x daily - 7 x weekly - 3 sets - 10 reps - Band Walks  - 1 x daily - 7 x weekly - 3 sets - 10 reps  ASSESSMENT:  CLINICAL IMPRESSION: Progressed with interventions to improve functional strength of R knee. Cues required to correct form with the banded walk exercises. He is continuing to show good progress, last visit is next week, he seems ready to transition to HEP. ABDULAHAD MEDEROS continues to demonstrate potential for improvement and would benefit from continued skilled therapy to address impairments.      OBJECTIVE IMPAIRMENTS: Abnormal gait, decreased activity tolerance, difficulty walking, decreased ROM, decreased strength, hypomobility, increased fascial restrictions, increased muscle spasms, impaired flexibility, postural dysfunction, and pain.   ACTIVITY LIMITATIONS: bending, sitting, transfers, dressing, and locomotion level  PARTICIPATION LIMITATIONS: community activity  PERSONAL FACTORS: Past/current experiences and 1-2 comorbidities: Hx R meniscal tear, hx laminectomy L4-5, DM  are also affecting patient's functional outcome.   REHAB POTENTIAL: Good  CLINICAL DECISION MAKING: Evolving/moderate complexity  EVALUATION COMPLEXITY: Moderate   GOALS: Goals reviewed with patient? Yes  SHORT TERM GOALS: Target date: 02/20/2023   Patient  will be independent with initial HEP. Baseline: given Goal status: MET 02/14/22   LONG TERM GOALS: Target date: 03/20/2023  Patient will be independent with advanced/ongoing HEP to improve outcomes and carryover.  Baseline:  Goal status: IN PROGRESS  2.  Patient will report at least 75% improvement in R knee pain with activity to improve QOL. Baseline: 5-6/10 sharp pain with twisting movement Goal status: IN PROGRESS- 03/01/23 pt reports 70% improvement  3.  Patient will demonstrate improved functional LE strength as demonstrated by 5/5 hip extensor strength. Baseline: 3/5 Goal status: IN PROGRESS-03/08/23  4.  Patient will be able to walk for 30 minutes without increased R knee pain.  Baseline: 10 minutes without pain Goal status: IN PROGRESS-   5.  Patient will report 9 points improvement on LEFS to demonstrate improved functional ability. Baseline: 67/80 Goal status: IN PROGRESS   PLAN:  PT FREQUENCY: 1-2x/week  PT DURATION: 6 weeks  PLANNED INTERVENTIONS: 97110-Therapeutic exercises, 97530- Therapeutic activity, [id number]- Neuromuscular re-education, 97535- Self Care, 16109- Manual therapy, [id number]- Gait training, 97014- Electrical stimulation (unattended), [phone]- Electrical stimulation (manual), [id number]- Ultrasound, 09811- Ionotophoresis 4mg /ml Dexamethasone, Patient/Family education, Balance training, Stair training, Taping, Dry Needling, Joint mobilization, Spinal mobilization, Cryotherapy, and Moist heat  PLAN FOR NEXT SESSION: focus on hip mobility and glute strengthening, extremely tight hips and hip flexors, no hip extension during gait.     Darleene Cleaver, PTA 03/15/2023, 4:18 PM

## 2023-03-20 ENCOUNTER — Ambulatory Visit: Payer: BC Managed Care – PPO | Admitting: Physical Therapy

## 2023-03-20 ENCOUNTER — Encounter: Payer: Self-pay | Admitting: Physical Therapy

## 2023-03-20 DIAGNOSIS — R252 Cramp and spasm: Secondary | ICD-10-CM

## 2023-03-20 DIAGNOSIS — M6281 Muscle weakness (generalized): Secondary | ICD-10-CM

## 2023-03-20 DIAGNOSIS — M25561 Pain in right knee: Secondary | ICD-10-CM | POA: Diagnosis not present

## 2023-03-20 DIAGNOSIS — R293 Abnormal posture: Secondary | ICD-10-CM | POA: Diagnosis not present

## 2023-03-20 NOTE — Therapy (Signed)
OUTPATIENT PHYSICAL THERAPY TREATMENT   Patient Name: Anthony Russell MRN: [id number] DOB:12-07-1980, 43 y.o., male Today's Date: 03/20/2023  END OF SESSION:  PT End of Session - 03/20/23 1359     Visit Number 11    Date for PT Re-Evaluation 03/20/23    Authorization Type BCBS    PT Start Time 1400    PT Stop Time 1445    PT Time Calculation (min) 45 min    Activity Tolerance Patient tolerated treatment well    Behavior During Therapy WFL for tasks assessed/performed                  Past Medical History:  Diagnosis Date   Allergy    Allergy, unspecified not elsewhere classified    rx w/ OTC antihistamines PRN   Anxiety    Asthma    Atypical chest pain    recent neg eval w/ normal cxr/ekg   Depression    Diverticulosis    DM (diabetes mellitus) (HCC)    Dyspepsia    on protonix 40mg /d   Esophagitis    GERD (gastroesophageal reflux disease)    on protonix 40mg /d   HTN (hypertension)    Hyperlipidemia    on diet alone   IBS (irritable bowel syndrome)    Lumbar back pain    s/p lumbar laminectomy 2007 by DrCabbell   Migraines    Overweight(278.02)    weight Jan10=246#.[name].he was 225# in 9/07...diet and exercise was discussed   Sleep apnea    not wearing c-pap currently   Past Surgical History:  Procedure Laterality Date   DENTAL SURGERY     LUMBAR LAMINECTOMY  01/30/2005   DrCabbell   TEAR DUCT PROBING     Patient Active Problem List   Diagnosis Date Noted   Lumbar radiculopathy 05/10/2022   Capsulitis of left shoulder 05/10/2022   Sinus tachycardia 12/28/2021   Impingement of knee joint, right 12/20/2021   Achilles tendinitis, right leg 12/20/2021   Rotator cuff tendinitis, right 11/04/2021   Encounter for screening for other metabolic disorders 04/07/2021   Dyspnea on exertion 04/07/2021   Migraines 02/23/2021   Lumbar back pain 02/23/2021   HTN (hypertension) 02/23/2021   Dyspepsia 02/23/2021   DM (diabetes mellitus) (HCC) 02/23/2021    Nasal septal deviation 02/17/2021   Laryngitis 01/19/2021   Asymmetric tonsils 12/01/2020   Subacromial bursitis of right shoulder joint 05/17/2020   Hypersomnia 11/03/2015   Obesity 11/03/2015   Allergic rhinitis 04/15/2014   Asthma in adult 04/15/2014   GERD (gastroesophageal reflux disease) 04/15/2014   IBS (irritable bowel syndrome) 04/01/2013   Nausea alone 01/15/2013   Diarrhea 01/15/2013   Depression 10/15/2012   Lesion of eyebrow 02/09/2012   Gastroparesis 07/05/2011   Anxiety 07/05/2010   TESTOSTERONE DEFICIENCY 05/26/2008   Hyperlipidemia 02/06/2008   Facet arthropathy, lumbar 02/06/2008   Atypical chest pain 02/06/2008    PCP: Esperanza Richters, PA-C   REFERRING PROVIDER: Burna Forts, MD  REFERRING DIAG: [phone] (ICD-10-CM) - Patellofemoral pain syndrome of right knee   THERAPY DIAG:  Acute pain of right knee  Muscle weakness (generalized)  Abnormal posture  Cramp and spasm  Rationale for Evaluation and Treatment: Rehabilitation  ONSET DATE: 12/31/2022  SUBJECTIVE:   SUBJECTIVE STATEMENT: Knee is doing ok.  Able to crouch down with mostly just tightness now.  Still notice a difference between R and L knee but no pain.   PERTINENT HISTORY: Hx R meniscal tear (2020?), hx laminectomy L4-5, DM PAIN:  Are you having pain? Yes: NPRS scale: 0/10 Pain location: R knee Pain description: sharp, achey Aggravating factors: certain movements - lifting up to put on shoes/socks, getting into car (twisting of knee) Relieving factors: goes away when stops the movement  PRECAUTIONS: None  RED FLAGS: None   WEIGHT BEARING RESTRICTIONS: No  FALLS:  Has patient fallen in last 6 months? No  LIVING ENVIRONMENT: Lives with: lives alone Lives in: House/apartment Stairs: Yes: External: 2 flights steps; can reach both Has following equipment at home: None  OCCUPATION:  Sport and exercise psychologist, works at home; has sit down and stand up desk   PLOF: Independent and  Leisure: rock boxing  PATIENT GOALS: no pain, do normal motion, walk for 30 minutes without pain  NEXT MD VISIT: follow up if needed after PT  OBJECTIVE:  Note: Objective measures were completed at Evaluation unless otherwise noted.  DIAGNOSTIC FINDINGS: no imaging of knee  PATIENT SURVEYS:  LEFS 67/80  COGNITION: Overall cognitive status: Within functional limits for tasks assessed     SENSATION: WFL  EDEMA:  None noted  MUSCLE LENGTH: Hamstrings: significant tightness bil Right ~60 deg; Left ~60 deg Quads - significant tightness bil Hip flexors - significant tightness bil- unable to fully extend hip in prone on R Itbands - significant tightness bil  POSTURE: rounded shoulders, forward head, and increased thoracic kyphosis  PALPATION: Tenderness along R superior patellofemoral tendon  LOWER EXTREMITY ROM:  limited hip internal rotation bil due to tightness   LOWER EXTREMITY MMT:  MMT Right eval Left eval R 03/08/23 L 03/08/23  Hip flexion 5 5    Hip extension 3 3 4 4   Hip abduction 5 5    Hip adduction 5 5    Knee flexion 5 4 5 5   Knee extension 5p! 5 5 5    (Blank rows = not tested)  LOWER EXTREMITY SPECIAL TESTS:  Knee special tests: McMurray's test: negative  GAIT: Distance walked: 200' Assistive device utilized: None Level of assistance: Complete Independence Comments: forward flexed posture, lacking hip extension bilaterally during stance phase.                                                                                                                                TREATMENT DATE:   03/20/23 Therapeutic Exercise: to improve strength and mobility.  Demo, verbal and tactile cues throughout for technique. Bike L2 x 6 min  Quadruped fire hydrants x 10 bil Quadruped hip extension x 10 bil  Clock excursion x 10 bil  Monster walks RTB x 50' Piriformis stretch R/L ITBand stretch standing R/L HEP update Therapeutic Activity:  to assess progress  towards goals MMT extensers LEFS Reported difficulty with crossing leg still  03/15/23 Therapeutic Exercise: to improve strength and mobility.  Demo, verbal and tactile cues throughout for technique. Elliptical L2 x 5 min Knee flexion 25# 2x15 Knee extension 10# x 10 BLE; 5# x 10 RLE Supine HS  curls orange pball x 10; trunk rotation x 10  Therapeutic Activity: to improve functional performance. Leg press 25# 3x10 BLE Squats with 10lb kettle bell 2x10 Kettle bell swings 10# 2x10 Side steps GTB at ankles 4x106ft Monster walks GTB at ankles 4x57ft- cues to keep weight through entire foot  03/13/23 Therapeutic Exercise: to improve strength and mobility.  Demo, verbal and tactile cues throughout for technique. Elliptical L2 x 5 min  Knee flexion 25# 2x15 BLE Knee extension 10# 2x10 BLE Quad stretch Hip ADD ball squeeze Therapeutic Activity: to improve functional performance. Side steps RTB at ankles 4x67ft Monster walks RTB at ankles 4x4ft Stiff leg deadlift with dowel x 10  Slide lunges lateral 2 x 10 R/L Slide fwd lunges 2 x 10 R/L  03/08/23 Therapeutic Exercise: to improve strength and mobility.  Demo, verbal and tactile cues throughout for technique. Elliptical L2 x 5 min  LE MMT Hip openers x 10 R/L Supine R SLR 2# 2x10 Prone quad stretch with strap 2x30"  Therapeutic Activity: to improve functional performance. Squats RTB at knees 2x10 Side steps RTB at knees 4x30ft Monster walks RTB at knees 4x69ft Wall squat x 30" Stiff leg deadlift with dowel 2x10    PATIENT EDUCATION:  Education details: HEP update Person educated: Patient Education method: Explanation, Demonstration, and Handouts Education comprehension: verbalized understanding and returned demonstration  HOME EXERCISE PROGRAM: Access Code: [email or web address] URL: [email or web address] Date: 03/20/2023 Prepared by: Harrie Foreman  Exercises - Modified Thomas Stretch  - 1 x daily - 7 x weekly - 1  sets - 3 reps - 30-45 sec hold - Hip Flexor Stretch with Chair  - 1 x daily - 7 x weekly - 1 sets - 3 reps - 30-45 sec hold - Supine Hamstring Stretch  - 1 x daily - 7 x weekly - 1 sets - 3 reps - 30-45 sec hold - Supine Hamstring Stretch with Strap  - 1 x daily - 7 x weekly - 1 sets - 3 reps - 30-45 sec hold - Supine ITB Stretch with Strap  - 1 x daily - 7 x weekly - 1 sets - 3 reps - 30-45 sec hold - Hip Adductors and Hamstring Stretch with Strap  - 1 x daily - 7 x weekly - 1 sets - 3 reps - 30-45 sec hold - Seated Hamstring Stretch  - 1 x daily - 7 x weekly - 1 sets - 3 reps - 30-45 sec hold - Double Leg Hamstring Stretch at Wall  - 1 x daily - 7 x weekly - 1 sets - 1 reps - 3-5 min hold - Single Leg Balance with Clock Reach  - 1 x daily - 7 x weekly - 1 sets - 10 reps - Forward T with Counter Support  - 1 x daily - 7 x weekly - 1 sets - 10 reps - Prone Quadriceps Stretch with Strap  - 1 x daily - 7 x weekly - 1 sets - 3 reps - 30 sec hold - Modified Thomas Stretch  - 1 x daily - 7 x weekly - 3 sets - 10 reps - Lunge Matrix on Slider  - 1 x daily - 7 x weekly - 3 sets - 10 reps - Thoracic Foam Roll Mobilization Backstroke  - 1 x daily - 7 x weekly - 3 sets - 10 reps - Toe Raise With Back Against Wall  - 1 x daily - 7 x weekly - 3 sets - 10 reps - Wall  Squat  - 1 x daily - 7 x weekly - 3 sets - 10 reps - Lower Quarter Anterior Reach  - 1 x daily - 7 x weekly - 3 sets - 10 reps - Side Stepping with Resistance at Ankles  - 1 x daily - 7 x weekly - 3 sets - 10 reps - Band Walks  - 1 x daily - 7 x weekly - 3 sets - 10 reps - Hip External Rotation Stretch  - 1 x daily - 7 x weekly - 1 sets - 3 reps - 30 sec hold - Standing Hip ER Stretch at Table  - 1 x daily - 7 x weekly - 1 sets - 3 reps - 30 sec hold - ITB Stretch at Wall  - 1 x daily - 7 x weekly - 1 sets - 3 reps - 30 sec hold  ASSESSMENT:  CLINICAL IMPRESSION: Anthony Russell has made good progress and met most of his goals.  His R knee  pain has improved 75% and mostly it is just tight now, not painful.  His LEFS has improved from 67/80 to 70/80 however this is not significant enough to meet this goal.  He has been participating in kickboxing without limitation.  Today he noted still having difficulty crossing R knee over left, after stretching R piriformis this was significantly easier.  We reviewed exercises and updated stretches for piriformis and Itband.  He is ready and appropriate for discharge today for R knee pain.   He follows up with Dr. Cherre Robins tomorrow and will discuss limited mobility in lumbar and thoracic spine.      OBJECTIVE IMPAIRMENTS: Abnormal gait, decreased activity tolerance, difficulty walking, decreased ROM, decreased strength, hypomobility, increased fascial restrictions, increased muscle spasms, impaired flexibility, postural dysfunction, and pain.   ACTIVITY LIMITATIONS: bending, sitting, transfers, dressing, and locomotion level  PARTICIPATION LIMITATIONS: community activity  PERSONAL FACTORS: Past/current experiences and 1-2 comorbidities: Hx R meniscal tear, hx laminectomy L4-5, DM  are also affecting patient's functional outcome.   REHAB POTENTIAL: Good  CLINICAL DECISION MAKING: Evolving/moderate complexity  EVALUATION COMPLEXITY: Moderate   GOALS: Goals reviewed with patient? Yes  SHORT TERM GOALS: Target date: 02/20/2023   Patient will be independent with initial HEP. Baseline: given Goal status: MET 02/14/22   LONG TERM GOALS: Target date: 03/20/2023  Patient will be independent with advanced/ongoing HEP to improve outcomes and carryover.  Baseline:  Goal status: MET 03/20/23  2.  Patient will report at least 75% improvement in R knee pain with activity to improve QOL. Baseline: 5-6/10 sharp pain with twisting movement Goal status: MET-03/20/23- 75% improvement  3.  Patient will demonstrate improved functional LE strength as demonstrated by 5/5 hip extensor strength. Baseline:  3/5 Goal status: MET-03/20/23 5/5 bil in available range.    4.  Patient will be able to walk for 30 minutes without increased R knee pain.  Baseline: 10 minutes without pain Goal status: IN PROGRESS- 03/20/23- hasn't tried yet due to weather, walked 10 min yesterday with no difficulty  5.  Patient will report 9 points improvement on LEFS to demonstrate improved functional ability. Baseline: 67/80 Goal status: IN PROGRESS 03/20/23 70/80   PLAN:  PT FREQUENCY: 1-2x/week  PT DURATION: 6 weeks  PLANNED INTERVENTIONS: 97110-Therapeutic exercises, 97530- Therapeutic activity, 97112- Neuromuscular re-education, 97535- Self Care, 16109- Manual therapy, [id number]- Gait training, 97014- Electrical stimulation (unattended), [id number]- Electrical stimulation (manual), [id number]- Ultrasound, 60454- Ionotophoresis 4mg /ml Dexamethasone, Patient/Family education, Balance training, Stair training, Taping, Dry  Needling, Joint mobilization, Spinal mobilization, Cryotherapy, and Moist heat  PLAN FOR NEXT SESSION: discharge to HEP   PHYSICAL THERAPY DISCHARGE SUMMARY  Visits from Start of Care: 11  Current functional level related to goals / functional outcomes: Goals met or almost met, R knee pain improved 75%    Remaining deficits: Tightness still throughout low back, hips, knees, Itband, impaired posture, R knee tightness   Education / Equipment: HEP  Plan: Patient agrees to discharge.   Patient is being discharged due to meeting the stated rehab goals.        Jena Gauss, PT 03/20/2023, 3:30 PM

## 2023-03-21 ENCOUNTER — Encounter: Payer: Self-pay | Admitting: Family Medicine

## 2023-03-21 ENCOUNTER — Ambulatory Visit (HOSPITAL_BASED_OUTPATIENT_CLINIC_OR_DEPARTMENT_OTHER)
Admission: RE | Admit: 2023-03-21 | Discharge: 2023-03-21 | Disposition: A | Discharge status: Home / Self Care | Payer: BC Managed Care – PPO | Source: Ambulatory Visit | Attending: Family Medicine | Admitting: Family Medicine

## 2023-03-21 ENCOUNTER — Ambulatory Visit (INDEPENDENT_AMBULATORY_CARE_PROVIDER_SITE_OTHER): Payer: BC Managed Care – PPO | Admitting: Family Medicine

## 2023-03-21 ENCOUNTER — Ambulatory Visit: Payer: BC Managed Care – PPO | Admitting: Family Medicine

## 2023-03-21 VITALS — BP 120/70 | Ht 71.0 in | Wt 190.0 lb

## 2023-03-21 DIAGNOSIS — M5136 Other intervertebral disc degeneration, lumbar region with discogenic back pain only: Secondary | ICD-10-CM | POA: Diagnosis not present

## 2023-03-21 DIAGNOSIS — M25532 Pain in left wrist: Secondary | ICD-10-CM

## 2023-03-21 DIAGNOSIS — M2569 Stiffness of other specified joint, not elsewhere classified: Secondary | ICD-10-CM | POA: Diagnosis not present

## 2023-03-21 DIAGNOSIS — M47816 Spondylosis without myelopathy or radiculopathy, lumbar region: Secondary | ICD-10-CM | POA: Diagnosis not present

## 2023-03-21 DIAGNOSIS — Z981 Arthrodesis status: Secondary | ICD-10-CM | POA: Diagnosis not present

## 2023-03-21 NOTE — Progress Notes (Unsigned)
CHIEF COMPLAINT: No chief complaint on file.  _____________________________________________________________ SUBJECTIVE  HPI  Pt is a 43 y.o. 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 Bothering as of last Friday after going Wednesday/Thursday, doesn't recall injuring his wrist Not constant But some movement will cause pain dorsal or volar or lateral side of the ulnar side of the wrist Pinky flexion Ice dulls pain for a while, icy hot also tried, advil (400mg /dual action with tylenol) No numbness/tingling Pain travelsfrom the wrist to the pinky Overall seems to be improving  ------------------------------------------------------------------------------------------------------ Past Medical History:  Diagnosis Date   Allergy    Allergy, unspecified not elsewhere classified    rx w/ OTC antihistamines PRN   Anxiety    Asthma    Atypical chest pain    recent neg eval w/ normal cxr/ekg   Depression    Diverticulosis    DM (diabetes mellitus) (HCC)    Dyspepsia    on protonix 40mg /d   Esophagitis    GERD (gastroesophageal reflux disease)    on protonix 40mg /d   HTN (hypertension)    Hyperlipidemia    on diet alone   IBS (irritable bowel syndrome)    Lumbar back pain    s/p lumbar laminectomy 2007 by DrCabbell   Migraines    Overweight(278.02)    weight Jan10=246#.[name].he was 225# in 9/07...diet and exercise was discussed   Sleep apnea    not wearing c-pap currently    Past Surgical History:  Procedure Laterality Date   DENTAL SURGERY     LUMBAR LAMINECTOMY  01/30/2005   DrCabbell   TEAR DUCT PROBING        Outpatient Encounter Medications as of 03/21/2023  Medication Sig   albuterol  (VENTOLIN HFA) 108 (90 Base) MCG/ACT inhaler INHALE 2 PUFFS INTO THE LUNGS EVERY 6 HOURS AS NEEDED FOR WHEEZING OR SHORTNESS OF BREATH (Patient taking differently: Inhale 2 puffs into the lungs every 6 (six) hours as needed for wheezing or shortness of breath.)   atorvastatin (LIPITOR) 10 MG tablet TAKE 1 TABLET(10 MG) BY MOUTH DAILY (Patient taking differently: Take 10 mg by mouth daily.)   budesonide-formoterol (SYMBICORT) 160-4.5 MCG/ACT inhaler Inhale 2 puffs into the lungs in the morning and at bedtime.   Continuous Glucose Sensor (FREESTYLE LIBRE 3 SENSOR) MISC USE SENSOR TO CHECK BLOOD GLUCOSE CONTINUOUSLY   fluticasone (FLONASE) 50 MCG/ACT nasal spray Place 2 sprays into both nostrils daily.   ibuprofen (ADVIL) 600 MG tablet Take 1 tablet (600 mg total) by mouth every 8 (eight) hours as needed. (Patient taking differently: Take 600 mg by mouth every 8 (eight) hours as needed for mild pain or moderate pain.)   levocetirizine (XYZAL) 5 MG tablet Take 1 tablet (5 mg total) by mouth every evening.   metFORMIN (GLUCOPHAGE) 1000 MG tablet TAKE 1 TABLET(1000 MG) BY MOUTH TWICE DAILY WITH A MEAL (Patient not taking: Reported on 11/09/2022)   Multiple Vitamin (MULTIVITAMIN ADULT PO) Take 2 tablets by mouth daily.   No facility-administered encounter medications on file as of 03/21/2023.    ------------------------------------------------------------------------------------------------------  _____________________________________________________________ OBJECTIVE  PHYSICAL EXAM  Today's Vitals   03/21/23 1106  BP: 120/70  Weight: 190 lb (86.2 kg)  Height: 5\' 11"  (1.803 m)   Body mass index  is 26.5 kg/m.   reviewed  General: A+Ox3, no acute distress, well-nourished, appropriate affect CV: pulses 2+ regular, nondiaphoretic, no peripheral edema, cap refill <2sec Lungs: no audible wheezing, non-labored breathing, bilateral chest rise/fall, nontachypneic Skin: warm, well-perfused,  non-icteric, no susp lesions or rashes Neuro: *** Sensation intact, muscle tone wnl, no atrophy Psych: no signs of depression or anxiety MSK:   HAND/WRIST: *** wrist clean/dry/intact skin. Full ROM without pain. *** soft tissue swelling.  *** tenderness along 1st extensor compartment.  ***Finkelstein's. *** bony tenderness.  *** snuff box tenderness.  ***tinels at carpal tunnel, *** Phalen's.  Normal grip strength. *** tenderness over carpals, metacarpals, phalanges.  ***wrist with FROM without pain, weakness, instability.     Back exam  _____________________________________________________________ ASSESSMENT/PLAN There are no diagnoses linked to this encounter.   Back: HLA  Wrist: bracing    Electronically signed by: Burna Forts, MD 03/21/2023 11:12 AM

## 2023-03-22 ENCOUNTER — Ambulatory Visit: Payer: BC Managed Care – PPO | Admitting: Family Medicine

## 2023-03-23 ENCOUNTER — Other Ambulatory Visit: Payer: Self-pay | Admitting: Family Medicine

## 2023-03-23 DIAGNOSIS — M545 Low back pain, unspecified: Secondary | ICD-10-CM

## 2023-03-23 NOTE — Progress Notes (Signed)
XR results discussed with patient. Pt had interval improvement with back ROM, is interested in exploring options to help with stiffness that may be associated with hypertonic musculature. PT order placed

## 2023-03-29 ENCOUNTER — Encounter: Payer: Self-pay | Admitting: Medical

## 2023-03-29 ENCOUNTER — Telehealth: Payer: Self-pay | Admitting: Medical

## 2023-03-29 NOTE — Telephone Encounter (Signed)
 Can you get pt scheduled for follow up on his back pain with me early next week.

## 2023-03-30 ENCOUNTER — Telehealth: Payer: Self-pay

## 2023-03-30 NOTE — Telephone Encounter (Signed)
 Pt scheduled 04/02/23

## 2023-03-30 NOTE — Telephone Encounter (Signed)
 04/02/23 appt

## 2023-03-30 NOTE — Telephone Encounter (Signed)
-----   Message from [facility] sent at 03/29/2023  2:49 PM EST ----- Regarding: FW: ankylosing spondylitis Please get pt scheduled for follow up visit for his back pain.Can you get him in next week. ----- Message ----- From: Jena Gauss, PT Sent: 03/29/2023   2:34 PM EST To: Esperanza Richters, PA-C Subject: ankylosing spondylitis                         We've been seeing Anthony Russell in PT for back pain and then knee pain.  He was referred by Dr. Cherre Robins in sports Medicine who just left.  Before she left I asked for imaging of his back because I was concerned about ankylosing spondylitis.  The imaging does show he is has fused from S1-L5-L4, and it looks like the process is continuing up the spine, and when you look at the images, he has no lordotic curvature at all.  She's referred him back to [place] again, but I'm going to encourage him to follow-up with you as well, but I think he needs imaging of his thoracic spine as well, and possibly a referral to rheumatology? To see if we can slow the progression.  I've never met some one so stiff at such a young age, I'm worried he be significantly debilitated if it isn't addressed.    Thanks,  Daryl Eastern, PT

## 2023-04-02 ENCOUNTER — Ambulatory Visit (INDEPENDENT_AMBULATORY_CARE_PROVIDER_SITE_OTHER): Payer: BC Managed Care – PPO | Admitting: Medical

## 2023-04-02 ENCOUNTER — Ambulatory Visit (HOSPITAL_BASED_OUTPATIENT_CLINIC_OR_DEPARTMENT_OTHER)
Admission: RE | Admit: 2023-04-02 | Discharge: 2023-04-02 | Disposition: A | Discharge status: Home / Self Care | Source: Ambulatory Visit | Attending: Medical | Admitting: Medical

## 2023-04-02 ENCOUNTER — Encounter: Payer: Self-pay | Admitting: Medical

## 2023-04-02 VITALS — BP 130/74 | HR 86 | Temp 98.0°F | Resp 18 | Ht 71.0 in | Wt 200.0 lb

## 2023-04-02 DIAGNOSIS — M545 Low back pain, unspecified: Secondary | ICD-10-CM

## 2023-04-02 DIAGNOSIS — M25532 Pain in left wrist: Secondary | ICD-10-CM | POA: Diagnosis not present

## 2023-04-02 DIAGNOSIS — M7660 Achilles tendinitis, unspecified leg: Secondary | ICD-10-CM

## 2023-04-02 DIAGNOSIS — M2550 Pain in unspecified joint: Secondary | ICD-10-CM

## 2023-04-02 DIAGNOSIS — M546 Pain in thoracic spine: Secondary | ICD-10-CM | POA: Diagnosis not present

## 2023-04-02 LAB — SEDIMENTATION RATE: Sed Rate: 1 mm/h (ref 0–15)

## 2023-04-02 NOTE — Progress Notes (Signed)
 Subjective:    Patient ID: Anthony Russell, male    DOB: 11-05-1980, 43 y.o.   MRN: [id number]  HPI  Discussed the use of AI scribe software for clinical note transcription with the patient, who gave verbal consent to proceed.  History of Present Illness   Anthony Russell is a 43 year old male with lumbar spondylosis and degenerative disc disease who presents with back stiffness and mild pain.  He experiences stiffness and mild pain primarily in the lumbar region, with some involvement of the thoracic area. The stiffness is described as a 'tight, low level pain' that becomes apparent during certain movements, particularly when exercising or stretching, impacting his ability to move freely. He has a history of lumbar spondylosis and degenerative disc disease, with a previous laminectomy and fusion at L4-5. He notes that his spine feels 'basically like straight'. He has been referred to sports medicine in the past and underwent an x-ray. He is currently being sent back to physical therapy, where severe stiffness was noted by the therapist.  He reports a recent issue with his left wrist following a session at a kickboxing gym approximately two weeks ago. He experienced pain the morning after hitting a bag, which has since improved by about 90%. He occasionally experiences a 'random twinge' when lifting objects, but no longer feels pain while typing.  He mentions intermittent pain in his Achilles tendon, which occurs while walking. He recalls a previous x-ray in 2019 that showed a small calcaneal spur, but he has not pursued further evaluation or treatment for this issue.          Review of Systems  Constitutional:  Negative for chills, fatigue and fever.  HENT:  Negative for congestion and ear pain.   Respiratory:  Negative for cough, choking, shortness of breath and wheezing.   Cardiovascular:  Negative for chest pain and palpitations.  Gastrointestinal:  Negative for abdominal pain,  constipation and nausea.  Musculoskeletal:  Positive for back pain. Negative for myalgias.       Left wrist pain. Rt achilles pain intermittnt.  Stiff lower back.  Skin:  Negative for rash.  Neurological:  Negative for dizziness, speech difficulty, weakness and numbness.  Hematological:  Negative for adenopathy. Does not bruise/bleed easily.  Psychiatric/Behavioral:  Negative for behavioral problems and decreased concentration.        Objective:   Physical Exam  General Mental Status- Alert. General Appearance- Not in acute distress.   Skin General: Color- Normal Color. Moisture- Normal Moisture.  Neck Carotid Arteries- Normal color. Moisture- Normal Moisture. No carotid bruits. No JVD.  Chest and Lung Exam Auscultation: Breath Sounds:-Normal.  Cardiovascular Auscultation:Rythm- Regular. Murmurs & Other Heart Sounds:Auscultation of the heart reveals- No Murmurs.  Abdomen Inspection:-Inspeection Normal. Palpation/Percussion:Note:No mass. Palpation and Percussion of the abdomen reveal- Non Tender, Non Distended + BS, no rebound or guarding.   Neurologic Cranial Nerve exam:- CN III-XII intact(No nystagmus), symmetric smile. Strength:- 5/5 equal and symmetric strength both upper and lower extremities.   Left wrist- minimal faint mid wrist pain on palpation. No pain in scaphoid area. Rt achilles tendon- not tender to palpation presently.    Assessment & Plan:   Assessment and Plan    Lumbar and Thoracic Stiffness Predominantly lumbar stiffness with some thoracic involvement. No significant pain reported. History of laminectomy and degenerative disc disease. Possible ankylosing spondylitis suggested by sports medicine. -Order labs for inflammation markers, ANA, C-reactive protein, sed rate, rheumatoid factor, and HLA B27. -Refer to  rheumatology for further evaluation and management.  Left Wrist Pain Resolving pain in the medial aspect of the left wrist following a  kickboxing injury two weeks ago. No current pain with typing or flexion, occasional twinge with lifting. -Order x-ray of left wrist to rule out any structural damage before patient resumes kickboxing.  Right Calcaneal Spur History of right calcaneal spur diagnosed in 2019. Occasional pain reported during walking. -Refer to podiatry for evaluation and possible need for CT of the ankle.   Follow up date to be determined after lab and imaging review.

## 2023-04-02 NOTE — Patient Instructions (Signed)
 Lumbar and Thoracic Stiffness Predominantly lumbar stiffness with some thoracic involvement. No significant pain reported. History of laminectomy and degenerative disc disease. Possible ankylosing spondylitis suggested by sports medicine. -Order labs for inflammation markers, ANA, C-reactive protein, sed rate, rheumatoid factor, and HLA B27. -Refer to rheumatology for further evaluation and management.  Left Wrist Pain Resolving pain in the medial aspect of the left wrist following a kickboxing injury two weeks ago. No current pain with typing or flexion, occasional twinge with lifting. -Order x-ray of left wrist to rule out any structural damage before patient resumes kickboxing.  Right Calcaneal Spur History of right calcaneal spur diagnosed in 2019. Occasional pain reported during walking. -Refer to podiatry for evaluation and possible need for CT of the ankle.   Follow up date to be determined after lab and imaging review.

## 2023-04-03 ENCOUNTER — Encounter: Payer: Self-pay | Admitting: Medical

## 2023-04-03 LAB — C-REACTIVE PROTEIN: CRP: <1 mg/dL (ref 0.5–20.0)

## 2023-04-04 LAB — ANA: Anti Nuclear Antibody (ANA): NEGATIVE

## 2023-04-04 LAB — HLA-B27 ANTIGEN: HLA-B27 Antigen: NEGATIVE

## 2023-04-04 LAB — RHEUMATOID FACTOR: Rheumatoid fact SerPl-aCnc: <10 [IU]/mL (ref <14)

## 2023-04-04 NOTE — Addendum Note (Signed)
 Addended by: Gwenevere Abbot on: 04/04/2023 09:54 PM   Modules accepted: Orders

## 2023-04-06 ENCOUNTER — Ambulatory Visit: Admitting: Podiatry

## 2023-04-06 DIAGNOSIS — M7661 Achilles tendinitis, right leg: Secondary | ICD-10-CM

## 2023-04-06 NOTE — Progress Notes (Signed)
 Subjective:  Patient ID: Anthony Russell, male    DOB: Dec 21, 1980,  MRN: [id number]  Chief Complaint  Patient presents with   Foot Pain    Right foot pain in the back of the heel     43 y.o. male presents with the above complaint.  Patient presents with right Achilles tendon insertional pain.  Patient states painful to touch just came out of nowhere hurts with ambulation he has not tried anything for it denies any other acute issues pain scale 7 out of 10 dull aching nature   Review of Systems: Negative except as noted in the HPI. Denies N/V/F/Ch.  Past Medical History:  Diagnosis Date   Allergy    Allergy, unspecified not elsewhere classified    rx w/ OTC antihistamines PRN   Anxiety    Asthma    Atypical chest pain    recent neg eval w/ normal cxr/ekg   Depression    Diverticulosis    DM (diabetes mellitus) (HCC)    Dyspepsia    on protonix 40mg /d   Esophagitis    GERD (gastroesophageal reflux disease)    on protonix 40mg /d   HTN (hypertension)    Hyperlipidemia    on diet alone   IBS (irritable bowel syndrome)    Lumbar back pain    s/p lumbar laminectomy 2007 by DrCabbell   Migraines    Overweight(278.02)    weight Jan10=246#.[name].he was 225# in 9/07...diet and exercise was discussed   Sleep apnea    not wearing c-pap currently    Current Outpatient Medications:    albuterol (VENTOLIN HFA) 108 (90 Base) MCG/ACT inhaler, INHALE 2 PUFFS INTO THE LUNGS EVERY 6 HOURS AS NEEDED FOR WHEEZING OR SHORTNESS OF BREATH (Patient taking differently: Inhale 2 puffs into the lungs every 6 (six) hours as needed for wheezing or shortness of breath.), Disp: 20.1 g, Rfl: 0   atorvastatin (LIPITOR) 10 MG tablet, TAKE 1 TABLET(10 MG) BY MOUTH DAILY (Patient taking differently: Take 10 mg by mouth daily.), Disp: 30 tablet, Rfl: 4   budesonide-formoterol (SYMBICORT) 160-4.5 MCG/ACT inhaler, Inhale 2 puffs into the lungs in the morning and at bedtime., Disp: 1 each, Rfl: 12   Continuous  Glucose Sensor (FREESTYLE LIBRE 3 SENSOR) MISC, USE SENSOR TO CHECK BLOOD GLUCOSE CONTINUOUSLY, Disp: 3 each, Rfl: 2   fluticasone (FLONASE) 50 MCG/ACT nasal spray, Place 2 sprays into both nostrils daily., Disp: 16 g, Rfl: 1   ibuprofen (ADVIL) 600 MG tablet, Take 1 tablet (600 mg total) by mouth every 8 (eight) hours as needed. (Patient taking differently: Take 600 mg by mouth every 8 (eight) hours as needed for mild pain or moderate pain.), Disp: 60 tablet, Rfl: 1   levocetirizine (XYZAL) 5 MG tablet, Take 1 tablet (5 mg total) by mouth every evening., Disp: 30 tablet, Rfl: 3   metFORMIN (GLUCOPHAGE) 1000 MG tablet, TAKE 1 TABLET(1000 MG) BY MOUTH TWICE DAILY WITH A MEAL (Patient not taking: Reported on 11/09/2022), Disp: 30 tablet, Rfl: 2   Multiple Vitamin (MULTIVITAMIN ADULT PO), Take 2 tablets by mouth daily., Disp: , Rfl:   Social History   Tobacco Use  Smoking Status Former   Current packs/day: 0.00   Average packs/day: 0.1 packs/day for 8.0 years (0.8 ttl pk-yrs)   Types: Cigarettes   Start date: 03/03/2003   Quit date: 03/03/2011   Years since quitting: 12.1  Smokeless Tobacco Never    No Known Allergies Objective:  There were no vitals filed for this visit.  There is no height or weight on file to calculate BMI. Constitutional Well developed. Well nourished.  Vascular Dorsalis pedis pulses palpable bilaterally. Posterior tibial pulses palpable bilaterally. Capillary refill normal to all digits.  No cyanosis or clubbing noted. Pedal hair growth normal.  Neurologic Normal speech. Oriented to person, place, and time. Epicritic sensation to light touch grossly present bilaterally.  Dermatologic Nails well groomed and normal in appearance. No open wounds. No skin lesions.  Orthopedic: Pain on palpation to the right Achilles tendon insertion positive Silfverskiold test noted positive gastrocnemius equinus noted.  No pain at the peroneal tendon posterior tibial tendon ATFL  ligament   Radiographs: None Assessment:  No diagnosis found. Plan:  Patient was evaluated and treated and all questions answered.  Right Achilles tendinitis insertional pain -All questions and concerns were discussed with the patient in extensive detail given the amount of pain that she is having she will benefit from cam boot immobilization patient agrees with the plan would like to proceed with cam boot immobilization -If there is no improvement we will discuss MRI versus injection.  Patient agrees with the plan  No follow-ups on file.

## 2023-04-09 ENCOUNTER — Ambulatory Visit: Admitting: Podiatry

## 2023-04-12 ENCOUNTER — Ambulatory Visit: Payer: BC Managed Care – PPO | Admitting: Physical Therapy

## 2023-04-24 ENCOUNTER — Other Ambulatory Visit: Payer: Self-pay

## 2023-04-24 ENCOUNTER — Ambulatory Visit: Payer: Self-pay | Attending: Sports Medicine | Admitting: Physical Therapy

## 2023-04-24 ENCOUNTER — Encounter: Payer: Self-pay | Admitting: Physical Therapy

## 2023-04-24 DIAGNOSIS — M5459 Other low back pain: Secondary | ICD-10-CM | POA: Insufficient documentation

## 2023-04-24 DIAGNOSIS — R252 Cramp and spasm: Secondary | ICD-10-CM | POA: Diagnosis not present

## 2023-04-24 DIAGNOSIS — G8929 Other chronic pain: Secondary | ICD-10-CM | POA: Insufficient documentation

## 2023-04-24 DIAGNOSIS — M545 Low back pain, unspecified: Secondary | ICD-10-CM | POA: Diagnosis not present

## 2023-04-24 DIAGNOSIS — R293 Abnormal posture: Secondary | ICD-10-CM | POA: Insufficient documentation

## 2023-04-24 NOTE — Therapy (Signed)
 OUTPATIENT PHYSICAL THERAPY THORACOLUMBAR EVALUATION   Patient Name: Anthony Russell MRN: [id number] DOB:February 14, 1980, 43 y.o., male Today's Date: 04/24/2023  END OF SESSION:  PT End of Session - 04/24/23 1026     Visit Number 1    Date for PT Re-Evaluation 06/19/23    Authorization Type [employer]    PT Start Time 1025    PT Stop Time 1105    PT Time Calculation (min) 40 min    Activity Tolerance Patient tolerated treatment well    Behavior During Therapy WFL for tasks assessed/performed             Past Medical History:  Diagnosis Date   Allergy    Allergy, unspecified not elsewhere classified    rx w/ OTC antihistamines PRN   Anxiety    Asthma    Atypical chest pain    recent neg eval w/ normal cxr/ekg   Depression    Diverticulosis    DM (diabetes mellitus) (HCC)    Dyspepsia    on protonix 40mg /d   Esophagitis    GERD (gastroesophageal reflux disease)    on protonix 40mg /d   HTN (hypertension)    Hyperlipidemia    on diet alone   IBS (irritable bowel syndrome)    Lumbar back pain    s/p lumbar laminectomy 2007 by DrCabbell   Migraines    Overweight(278.02)    weight Jan10=246#.[name].he was 225# in 9/07...diet and exercise was discussed   Sleep apnea    not wearing c-pap currently   Past Surgical History:  Procedure Laterality Date   DENTAL SURGERY     LUMBAR LAMINECTOMY  01/30/2005   DrCabbell   TEAR DUCT PROBING     Patient Active Problem List   Diagnosis Date Noted   Lumbar radiculopathy 05/10/2022   Capsulitis of left shoulder 05/10/2022   Sinus tachycardia 12/28/2021   Impingement of knee joint, right 12/20/2021   Achilles tendinitis, right leg 12/20/2021   Rotator cuff tendinitis, right 11/04/2021   Encounter for screening for other metabolic disorders 04/07/2021   Dyspnea on exertion 04/07/2021   Migraines 02/23/2021   Lumbar back pain 02/23/2021   HTN (hypertension) 02/23/2021   Dyspepsia 02/23/2021   DM (diabetes mellitus) (HCC) 02/23/2021    Nasal septal deviation 02/17/2021   Laryngitis 01/19/2021   Asymmetric tonsils 12/01/2020   Subacromial bursitis of right shoulder joint 05/17/2020   Hypersomnia 11/03/2015   Obesity 11/03/2015   Allergic rhinitis 04/15/2014   Asthma in adult 04/15/2014   GERD (gastroesophageal reflux disease) 04/15/2014   IBS (irritable bowel syndrome) 04/01/2013   Nausea alone 01/15/2013   Diarrhea 01/15/2013   Depression 10/15/2012   Lesion of eyebrow 02/09/2012   Gastroparesis 07/05/2011   Anxiety 07/05/2010   TESTOSTERONE DEFICIENCY 05/26/2008   Hyperlipidemia 02/06/2008   Facet arthropathy, lumbar 02/06/2008   Atypical chest pain 02/06/2008    PCP: Marisue Brooklyn   REFERRING PROVIDER: Ralene Cork, DO   REFERRING DIAG: [phone] (ICD-10-CM) - Chronic bilateral low back pain without sciatica   Rationale for Evaluation and Treatment: Rehabilitation  THERAPY DIAG:  Other low back pain  Abnormal posture  ONSET DATE: chronic worsening over last year   SUBJECTIVE:  SUBJECTIVE STATEMENT: Patient arrives today with CAM boot on R foot due to R achilles tendonitis/bone spur.  Has history of LBP and stiffness, has been referred to rheumatology, appt. In August.   Has less pain and more tightness in low back, if gets up after sitting for a while has limited ROM and a little pain.  He's been seeing chiropractor who has been mobilizing what he can, more in hips and upper back, he is now able to lie flat.   PERTINENT HISTORY:  Hx R meniscal tear (2020), hx laminectomy L4-5, DM, R achilles tendonitis, chronic back pain  PAIN:  Are you having pain? Yes: NPRS scale: 0-6/10 Pain location: low back  Pain description: stiffness, throbbing  Aggravating factors: moving especially after sitting prolong  periods Relieving factors: laying down, changing positions  PRECAUTIONS: None  RED FLAGS: None   WEIGHT BEARING RESTRICTIONS: No  FALLS:  Has patient fallen in last 6 months? No  LIVING ENVIRONMENT: Lives with: lives with their spouse Lives in: House/apartment Stairs: Yes: External: 2 flights steps; on right going up, on left going up, and can reach both Has following equipment at home: None  OCCUPATION: Sport and exercise psychologist, works at home; has sit down and stand up desk   PLOF: Independent and Leisure: boxing   PATIENT GOALS: move easier with better ROM and not hurt as much  NEXT MD VISIT: 08/31/2023 with Rheumatology  OBJECTIVE:   DIAGNOSTIC FINDINGS:  03/21/23 DG Lumbar spine FINDINGS: Substantial lumbar spondylosis with posterior element fusion at L4-L5-S1 and extensive facet spurring at all levels between L2 and S1. Moderate loss of intervertebral disc height at L4-5 and L5-S1.   No subluxation or appreciable fracture.   IMPRESSION: 1. Substantial lumbar spondylosis and degenerative disc disease. 2. Posterior element fusion at L4-L5-S1.  PATIENT SURVEYS:  Modified Oswestry 6/50   COGNITION: Overall cognitive status: Within functional limits for tasks assessed     SENSATION: WFL  MUSCLE LENGTH: Hamstrings: Right 70 deg; Left 70 deg - significant tightness bil Quadriceps in prone: Right 60 deg; Left 70 deg - significant tightness bil   POSTURE: decreased lumbar lordosis and increased thoracic kyphosis  PALPATION: Decreased mobility throughout lumbar spine with PA mobs,   LUMBAR ROM:   AROM eval  Flexion No lumbar flexion, compensates with hip and knee flexion  Extension Limited 90%, compensating with hip extension  Right lateral flexion Limited 90%, compensating hip flexion  Left lateral flexion Limited 90%, compensating hip flexion  Right rotation Limited 90%, compensating hip rotation  Left rotation Limited 90%, compensating hip rotation   (Blank  rows = not tested)  LOWER EXTREMITY ROM:     L hip internal rotation 0, R hip internal rotation 16  LOWER EXTREMITY MMT:    MMT Right eval Left eval  Hip flexion 5 5  Hip extension 4 4  Hip abduction 5 5  Hip adduction 5 5  Knee flexion 5 5  Knee extension 5 5  Ankle dorsiflexion Cam boot   Ankle plantarflexion Cam boot    (Blank rows = not tested)  GAIT: Distance walked: 50' Assistive device utilized: None Level of assistance: Complete Independence Comments: no deviation   TODAY'S TREATMENT:  DATE:   04/24/23 EVAL Self Care: Findings, POC, discussion of chiropractic, recommendations to continue to work on as much mobility as possible, review of imaging and findings, importance of regaining as much mobility in hips/hamstrings/quads to decrease pull in low back Therapeutic Exercise: to improve strength and mobility.  Demo, verbal and tactile cues throughout for technique.  Supine Hip Internal and External Rotation  -  10 reps - Bent Knee Fallouts  - 10 reps - Supine Posterior Pelvic Tilt with Knee Rocks  - 10 reps - Supine Posterior Pelvic Tilt  - 10 reps - Hooklying Hamstring Stretch with Strap  - review  - Prone Quadriceps Stretch with Strap  - review   PATIENT EDUCATION:  Education details: HEP, findings, POC Person educated: Patient Education method: Explanation, Demonstration, Verbal cues, and Handouts Education comprehension: verbalized understanding and returned demonstration  HOME EXERCISE PROGRAM: Access Code: [email or web address] URL: [email or web address] Date: 04/24/2023 Prepared by: Harrie Foreman  Exercises - Supine Hip Internal and External Rotation  - 1 x daily - 7 x weekly - 2 sets - 10 reps - Bent Knee Fallouts  - 1 x daily - 7 x weekly - 2 sets - 10 reps - Supine Posterior Pelvic Tilt with Knee Rocks  - 1 x daily - 7  x weekly - 1 sets - 10 reps - Supine Posterior Pelvic Tilt  - 1 x daily - 7 x weekly - 1 sets - 10 reps - Hooklying Hamstring Stretch with Strap  - 1 x daily - 7 x weekly - 1 sets - 3 reps - 30 sec hold - Prone Quadriceps Stretch with Strap  - 1 x daily - 7 x weekly - 1 sets - 3 reps - 30 sec hold  ASSESSMENT:  CLINICAL IMPRESSION: Anthony Russell  is a pleasant 43 year-old male referred for low back pain. Concerns for ankylosing spondylosis as imaging reveals lumbar spine has autofused from S1-L5-L4 so he has been referred to rheumatology.  He reports he has more stiffness than pain, but with sitting for prolonged periods he does get 6/10 pain in low back.  He has been seeing chiropractor which has improved thoracic kyphosis significantly.  Today noted significant tightness in hamstrings and quads, along with limited hip internal rotation especially on left, as well as limited lumbar ROM.   Given HEP to start addressing these deficits, with discussion on importance of maintaining as much flexibility and ROM as possible.  Patient will benefit from skilled PT services to address deficits and return to pain-free function at home and work.   OBJECTIVE IMPAIRMENTS: decreased activity tolerance, decreased mobility, decreased ROM, decreased strength, increased fascial restrictions, impaired perceived functional ability, increased muscle spasms, impaired flexibility, postural dysfunction, and pain.   ACTIVITY LIMITATIONS: lifting, bending, sitting, standing, and squatting  PARTICIPATION LIMITATIONS: meal prep, cleaning, laundry, shopping, and occupation  PERSONAL FACTORS: Past/current experiences, Time since onset of injury/illness/exacerbation, and 1-2 comorbidities: Hx R meniscal tear (2020), hx laminectomy L4-5, DM, R achilles tendonitis, chronic back pain  are also affecting patient's functional outcome.   REHAB POTENTIAL: Good  CLINICAL DECISION MAKING: Evolving/moderate complexity  EVALUATION  COMPLEXITY: Moderate   GOALS: Goals reviewed with patient? Yes  SHORT TERM GOALS: Target date: 05/08/2023   Patient will be independent with initial HEP.  Baseline:  Goal status: INITIAL   LONG TERM GOALS: Target date: 06/19/2023   Patient will be independent with advanced/ongoing HEP to improve outcomes and carryover.  Baseline:  Goal status: INITIAL  2.  Patient will report 75% improvement in low back pain/stiffness to improve QOL.  Baseline:  Goal status: INITIAL  3.  Patient will demonstrate improved hamstring extensibility by 25 deg bil to decrease pull on low back.  Baseline: measured through popliteal angle Right lacking 70 deg; Left lacking 70 deg  Goal status: INITIAL  4.  Patient will demonstrate improved quad extensibility as being able to bend knees in prone by 15 deg bil.    Baseline: Right 60 deg; Left 70 deg  Goal status: INITIAL  PLAN:  PT FREQUENCY: 1-2x/week  PT DURATION: 8 weeks  PLANNED INTERVENTIONS: 97110-Therapeutic exercises, 97530- Therapeutic activity, 97112- Neuromuscular re-education, 97535- Self Care, 16109- Manual therapy, Taping, Dry Needling, Joint mobilization, Joint manipulation, Spinal manipulation, Spinal mobilization, Cryotherapy, and Moist heat.  PLAN FOR NEXT SESSION: progress/review stretches and lumbar/hip mobility as tolerated   Jena Gauss, PT 04/24/2023, 5:24 PM

## 2023-04-30 ENCOUNTER — Ambulatory Visit: Admitting: Physical Therapy

## 2023-04-30 NOTE — Therapy (Deleted)
 OUTPATIENT PHYSICAL THERAPY TREATMENT   Patient Name: Anthony Russell MRN: [id number] DOB:03/19/1980, 43 y.o., male Today's Date: 04/30/2023  END OF SESSION:    Past Medical History:  Diagnosis Date   Allergy    Allergy, unspecified not elsewhere classified    rx w/ OTC antihistamines PRN   Anxiety    Asthma    Atypical chest pain    recent neg eval w/ normal cxr/ekg   Depression    Diverticulosis    DM (diabetes mellitus) (HCC)    Dyspepsia    on protonix 40mg /d   Esophagitis    GERD (gastroesophageal reflux disease)    on protonix 40mg /d   HTN (hypertension)    Hyperlipidemia    on diet alone   IBS (irritable bowel syndrome)    Lumbar back pain    s/p lumbar laminectomy 2007 by DrCabbell   Migraines    Overweight(278.02)    weight Jan10=246#.[name].he was 225# in 9/07...diet and exercise was discussed   Sleep apnea    not wearing c-pap currently   Past Surgical History:  Procedure Laterality Date   DENTAL SURGERY     LUMBAR LAMINECTOMY  01/30/2005   DrCabbell   TEAR DUCT PROBING     Patient Active Problem List   Diagnosis Date Noted   Lumbar radiculopathy 05/10/2022   Capsulitis of left shoulder 05/10/2022   Sinus tachycardia 12/28/2021   Impingement of knee joint, right 12/20/2021   Achilles tendinitis, right leg 12/20/2021   Rotator cuff tendinitis, right 11/04/2021   Encounter for screening for other metabolic disorders 04/07/2021   Dyspnea on exertion 04/07/2021   Migraines 02/23/2021   Lumbar back pain 02/23/2021   HTN (hypertension) 02/23/2021   Dyspepsia 02/23/2021   DM (diabetes mellitus) (HCC) 02/23/2021   Nasal septal deviation 02/17/2021   Laryngitis 01/19/2021   Asymmetric tonsils 12/01/2020   Subacromial bursitis of right shoulder joint 05/17/2020   Hypersomnia 11/03/2015   Obesity 11/03/2015   Allergic rhinitis 04/15/2014   Asthma in adult 04/15/2014   GERD (gastroesophageal reflux disease) 04/15/2014   IBS (irritable bowel syndrome)  04/01/2013   Nausea alone 01/15/2013   Diarrhea 01/15/2013   Depression 10/15/2012   Lesion of eyebrow 02/09/2012   Gastroparesis 07/05/2011   Anxiety 07/05/2010   TESTOSTERONE DEFICIENCY 05/26/2008   Hyperlipidemia 02/06/2008   Facet arthropathy, lumbar 02/06/2008   Atypical chest pain 02/06/2008    PCP: Esperanza Richters, PA-C   REFERRING PROVIDER: Ralene Cork, DO   REFERRING DIAG: [id number] (ICD-10-CM) - Chronic bilateral low back pain without sciatica   Rationale for Evaluation and Treatment: Rehabilitation  THERAPY DIAG:  No diagnosis found.  ONSET DATE: chronic worsening over last year   SUBJECTIVE:  SUBJECTIVE STATEMENT: *** Patient arrives today with CAM boot on R foot due to R achilles tendonitis/bone spur.  Has history of LBP and stiffness, has been referred to rheumatology, appt. In August.   Has less pain and more tightness in low back, if gets up after sitting for a while has limited ROM and a little pain.  He's been seeing chiropractor who has been mobilizing what he can, more in hips and upper back, he is now able to lie flat.   PERTINENT HISTORY:  Hx R meniscal tear (2020), hx laminectomy L4-5, DM, R achilles tendonitis, chronic back pain  PAIN:  Are you having pain? Yes: NPRS scale: 0-6/10 Pain location: low back  Pain description: stiffness, throbbing  Aggravating factors: moving especially after sitting prolong periods Relieving factors: laying down, changing positions  PRECAUTIONS: None  RED FLAGS: None   WEIGHT BEARING RESTRICTIONS: No  FALLS:  Has patient fallen in last 6 months? No  LIVING ENVIRONMENT: Lives with: lives with their spouse Lives in: House/apartment Stairs: Yes: External: 2 flights steps; on right going up, on left going up, and can  reach both Has following equipment at home: None  OCCUPATION: Sport and exercise psychologist, works at home; has sit down and stand up desk   PLOF: Independent and Leisure: boxing   PATIENT GOALS: move easier with better ROM and not hurt as much  NEXT MD VISIT: 08/31/2023 with Rheumatology  OBJECTIVE:   DIAGNOSTIC FINDINGS:  03/21/23 DG Lumbar spine FINDINGS: Substantial lumbar spondylosis with posterior element fusion at L4-L5-S1 and extensive facet spurring at all levels between L2 and S1. Moderate loss of intervertebral disc height at L4-5 and L5-S1.   No subluxation or appreciable fracture.   IMPRESSION: 1. Substantial lumbar spondylosis and degenerative disc disease. 2. Posterior element fusion at L4-L5-S1.  PATIENT SURVEYS:  Modified Oswestry 6/50   COGNITION: Overall cognitive status: Within functional limits for tasks assessed     SENSATION: WFL  MUSCLE LENGTH: Hamstrings: Right 70 deg; Left 70 deg - significant tightness bil Quadriceps in prone: Right 60 deg; Left 70 deg - significant tightness bil   POSTURE: decreased lumbar lordosis and increased thoracic kyphosis  PALPATION: Decreased mobility throughout lumbar spine with PA mobs,   LUMBAR ROM:   AROM eval  Flexion No lumbar flexion, compensates with hip and knee flexion  Extension Limited 90%, compensating with hip extension  Right lateral flexion Limited 90%, compensating hip flexion  Left lateral flexion Limited 90%, compensating hip flexion  Right rotation Limited 90%, compensating hip rotation  Left rotation Limited 90%, compensating hip rotation   (Blank rows = not tested)  LOWER EXTREMITY ROM:     L hip internal rotation 0, R hip internal rotation 16  LOWER EXTREMITY MMT:    MMT Right eval Left eval  Hip flexion 5 5  Hip extension 4 4  Hip abduction 5 5  Hip adduction 5 5  Knee flexion 5 5  Knee extension 5 5  Ankle dorsiflexion Cam boot   Ankle plantarflexion Cam boot    (Blank rows = not  tested)  GAIT: Distance walked: 50' Assistive device utilized: None Level of assistance: Complete Independence Comments: no deviation   TODAY'S TREATMENT:  DATE:  04/30/23 ***   04/24/23 EVAL Self Care: Findings, POC, discussion of chiropractic, recommendations to continue to work on as much mobility as possible, review of imaging and findings, importance of regaining as much mobility in hips/hamstrings/quads to decrease pull in low back Therapeutic Exercise: to improve strength and mobility.  Demo, verbal and tactile cues throughout for technique.  Supine Hip Internal and External Rotation  -  10 reps - Bent Knee Fallouts  - 10 reps - Supine Posterior Pelvic Tilt with Knee Rocks  - 10 reps - Supine Posterior Pelvic Tilt  - 10 reps - Hooklying Hamstring Stretch with Strap  - review  - Prone Quadriceps Stretch with Strap  - review   PATIENT EDUCATION:  Education details: HEP, findings, POC Person educated: Patient Education method: Explanation, Demonstration, Verbal cues, and Handouts Education comprehension: verbalized understanding and returned demonstration  HOME EXERCISE PROGRAM: Access Code: [email or web address] URL: [email or web address] Date: 04/24/2023 Prepared by: Harrie Foreman  Exercises - Supine Hip Internal and External Rotation  - 1 x daily - 7 x weekly - 2 sets - 10 reps - Bent Knee Fallouts  - 1 x daily - 7 x weekly - 2 sets - 10 reps - Supine Posterior Pelvic Tilt with Knee Rocks  - 1 x daily - 7 x weekly - 1 sets - 10 reps - Supine Posterior Pelvic Tilt  - 1 x daily - 7 x weekly - 1 sets - 10 reps - Hooklying Hamstring Stretch with Strap  - 1 x daily - 7 x weekly - 1 sets - 3 reps - 30 sec hold - Prone Quadriceps Stretch with Strap  - 1 x daily - 7 x weekly - 1 sets - 3 reps - 30 sec hold  ASSESSMENT:  CLINICAL  IMPRESSION: ***  From eval: STORY VANVRANKEN is a pleasant 43 year-old male referred for low back pain. Concerns for ankylosing spondylosis as imaging reveals lumbar spine has autofused from S1-L5-L4 so he has been referred to rheumatology.  He reports he has more stiffness than pain, but with sitting for prolonged periods he does get 6/10 pain in low back.  He has been seeing chiropractor which has improved thoracic kyphosis significantly.  Today noted significant tightness in hamstrings and quads, along with limited hip internal rotation especially on left, as well as limited lumbar ROM.   Given HEP to start addressing these deficits, with discussion on importance of maintaining as much flexibility and ROM as possible.  Patient will benefit from skilled PT services to address deficits and return to pain-free function at home and work.   OBJECTIVE IMPAIRMENTS: decreased activity tolerance, decreased mobility, decreased ROM, decreased strength, increased fascial restrictions, impaired perceived functional ability, increased muscle spasms, impaired flexibility, postural dysfunction, and pain.   ACTIVITY LIMITATIONS: lifting, bending, sitting, standing, and squatting  PARTICIPATION LIMITATIONS: meal prep, cleaning, laundry, shopping, and occupation  PERSONAL FACTORS: Past/current experiences, Time since onset of injury/illness/exacerbation, and 1-2 comorbidities: Hx R meniscal tear (2020), hx laminectomy L4-5, DM, R achilles tendonitis, chronic back pain  are also affecting patient's functional outcome.   REHAB POTENTIAL: Good  CLINICAL DECISION MAKING: Evolving/moderate complexity  EVALUATION COMPLEXITY: Moderate   GOALS: Goals reviewed with patient? Yes  SHORT TERM GOALS: Target date: 05/08/2023   Patient will be independent with initial HEP.  Baseline:  Goal status: INITIAL   LONG TERM GOALS: Target date: 06/19/2023   Patient will be independent with advanced/ongoing HEP to improve  outcomes and carryover.  Baseline:  Goal status: INITIAL  2.  Patient will report 75% improvement in low back pain/stiffness to improve QOL.  Baseline:  Goal status: INITIAL  3.  Patient will demonstrate improved hamstring extensibility by 25 deg bil to decrease pull on low back.  Baseline: measured through popliteal angle Right lacking 70 deg; Left lacking 70 deg  Goal status: INITIAL  4.  Patient will demonstrate improved quad extensibility as being able to bend knees in prone by 15 deg bil.    Baseline: Right 60 deg; Left 70 deg  Goal status: INITIAL  PLAN:  PT FREQUENCY: 1-2x/week  PT DURATION: 8 weeks  PLANNED INTERVENTIONS: 97110-Therapeutic exercises, 97530- Therapeutic activity, 97112- Neuromuscular re-education, 97535- Self Care, 16109- Manual therapy, Taping, Dry Needling, Joint mobilization, Joint manipulation, Spinal manipulation, Spinal mobilization, Cryotherapy, and Moist heat.  PLAN FOR NEXT SESSION: progress/review stretches and lumbar/hip mobility as tolerated   Vandora Jaskulski April Ma L Ekta Dancer, PT 04/30/2023, 8:12 AM

## 2023-05-03 ENCOUNTER — Ambulatory Visit: Attending: Sports Medicine

## 2023-05-03 DIAGNOSIS — M5459 Other low back pain: Secondary | ICD-10-CM | POA: Diagnosis not present

## 2023-05-03 DIAGNOSIS — M25561 Pain in right knee: Secondary | ICD-10-CM | POA: Insufficient documentation

## 2023-05-03 DIAGNOSIS — R252 Cramp and spasm: Secondary | ICD-10-CM | POA: Diagnosis not present

## 2023-05-03 DIAGNOSIS — R2689 Other abnormalities of gait and mobility: Secondary | ICD-10-CM | POA: Insufficient documentation

## 2023-05-03 DIAGNOSIS — R293 Abnormal posture: Secondary | ICD-10-CM | POA: Diagnosis not present

## 2023-05-03 DIAGNOSIS — M5416 Radiculopathy, lumbar region: Secondary | ICD-10-CM | POA: Diagnosis not present

## 2023-05-03 DIAGNOSIS — M6281 Muscle weakness (generalized): Secondary | ICD-10-CM | POA: Diagnosis not present

## 2023-05-03 NOTE — Therapy (Signed)
 OUTPATIENT PHYSICAL THERAPY THORACOLUMBAR TREATMENT   Patient Name: Anthony Russell MRN: [id number] DOB:01/05/1981, 43 y.o., male Today's Date: 05/03/2023  END OF SESSION:  PT End of Session - 05/03/23 1123     Visit Number 2    Date for PT Re-Evaluation 06/19/23    Authorization Type [employer]    PT Start Time 1056    PT Stop Time 1142    PT Time Calculation (min) 46 min    Activity Tolerance Patient tolerated treatment well    Behavior During Therapy WFL for tasks assessed/performed              Past Medical History:  Diagnosis Date   Allergy    Allergy, unspecified not elsewhere classified    rx w/ OTC antihistamines PRN   Anxiety    Asthma    Atypical chest pain    recent neg eval w/ normal cxr/ekg   Depression    Diverticulosis    DM (diabetes mellitus) (HCC)    Dyspepsia    on protonix 40mg /d   Esophagitis    GERD (gastroesophageal reflux disease)    on protonix 40mg /d   HTN (hypertension)    Hyperlipidemia    on diet alone   IBS (irritable bowel syndrome)    Lumbar back pain    s/p lumbar laminectomy 2007 by DrCabbell   Migraines    Overweight(278.02)    weight Jan10=246#.[name].he was 225# in 9/07...diet and exercise was discussed   Sleep apnea    not wearing c-pap currently   Past Surgical History:  Procedure Laterality Date   DENTAL SURGERY     LUMBAR LAMINECTOMY  01/30/2005   DrCabbell   TEAR DUCT PROBING     Patient Active Problem List   Diagnosis Date Noted   Lumbar radiculopathy 05/10/2022   Capsulitis of left shoulder 05/10/2022   Sinus tachycardia 12/28/2021   Impingement of knee joint, right 12/20/2021   Achilles tendinitis, right leg 12/20/2021   Rotator cuff tendinitis, right 11/04/2021   Encounter for screening for other metabolic disorders 04/07/2021   Dyspnea on exertion 04/07/2021   Migraines 02/23/2021   Lumbar back pain 02/23/2021   HTN (hypertension) 02/23/2021   Dyspepsia 02/23/2021   DM (diabetes mellitus) (HCC) 02/23/2021    Nasal septal deviation 02/17/2021   Laryngitis 01/19/2021   Asymmetric tonsils 12/01/2020   Subacromial bursitis of right shoulder joint 05/17/2020   Hypersomnia 11/03/2015   Obesity 11/03/2015   Allergic rhinitis 04/15/2014   Asthma in adult 04/15/2014   GERD (gastroesophageal reflux disease) 04/15/2014   IBS (irritable bowel syndrome) 04/01/2013   Nausea alone 01/15/2013   Diarrhea 01/15/2013   Depression 10/15/2012   Lesion of eyebrow 02/09/2012   Gastroparesis 07/05/2011   Anxiety 07/05/2010   TESTOSTERONE DEFICIENCY 05/26/2008   Hyperlipidemia 02/06/2008   Facet arthropathy, lumbar 02/06/2008   Atypical chest pain 02/06/2008    PCP: Marisue Brooklyn   REFERRING PROVIDER: Ralene Cork, DO   REFERRING DIAG: [phone] (ICD-10-CM) - Chronic bilateral low back pain without sciatica   Rationale for Evaluation and Treatment: Rehabilitation  THERAPY DIAG:  Other low back pain  Abnormal posture  Cramp and spasm  ONSET DATE: chronic worsening over last year   SUBJECTIVE:  SUBJECTIVE STATEMENT: Patient is now out of the CAM boot, he goes to podiatrist tomorrow. His back feels tight right now.  PERTINENT HISTORY:  Hx R meniscal tear (2020), hx laminectomy L4-5, DM, R achilles tendonitis, chronic back pain  PAIN:  Are you having pain? Yes: NPRS scale: 0/10 Pain location: low back  Pain description: stiffness, throbbing  Aggravating factors: moving especially after sitting prolong periods Relieving factors: laying down, changing positions  PRECAUTIONS: None  RED FLAGS: None   WEIGHT BEARING RESTRICTIONS: No  FALLS:  Has patient fallen in last 6 months? No  LIVING ENVIRONMENT: Lives with: lives with their spouse Lives in: House/apartment Stairs: Yes: External: 2  flights steps; on right going up, on left going up, and can reach both Has following equipment at home: None  OCCUPATION: Sport and exercise psychologist, works at home; has sit down and stand up desk   PLOF: Independent and Leisure: boxing   PATIENT GOALS: move easier with better ROM and not hurt as much  NEXT MD VISIT: 08/31/2023 with Rheumatology  OBJECTIVE:   DIAGNOSTIC FINDINGS:  03/21/23 DG Lumbar spine FINDINGS: Substantial lumbar spondylosis with posterior element fusion at L4-L5-S1 and extensive facet spurring at all levels between L2 and S1. Moderate loss of intervertebral disc height at L4-5 and L5-S1.   No subluxation or appreciable fracture.   IMPRESSION: 1. Substantial lumbar spondylosis and degenerative disc disease. 2. Posterior element fusion at L4-L5-S1.  PATIENT SURVEYS:  Modified Oswestry 6/50   COGNITION: Overall cognitive status: Within functional limits for tasks assessed     SENSATION: WFL  MUSCLE LENGTH: Hamstrings: Right 70 deg; Left 70 deg - significant tightness bil Quadriceps in prone: Right 60 deg; Left 70 deg - significant tightness bil   POSTURE: decreased lumbar lordosis and increased thoracic kyphosis  PALPATION: Decreased mobility throughout lumbar spine with PA mobs,   LUMBAR ROM:   AROM eval  Flexion No lumbar flexion, compensates with hip and knee flexion  Extension Limited 90%, compensating with hip extension  Right lateral flexion Limited 90%, compensating hip flexion  Left lateral flexion Limited 90%, compensating hip flexion  Right rotation Limited 90%, compensating hip rotation  Left rotation Limited 90%, compensating hip rotation   (Blank rows = not tested)  LOWER EXTREMITY ROM:     L hip internal rotation 0, R hip internal rotation 16  LOWER EXTREMITY MMT:    MMT Right eval Left eval  Hip flexion 5 5  Hip extension 4 4  Hip abduction 5 5  Hip adduction 5 5  Knee flexion 5 5  Knee extension 5 5  Ankle dorsiflexion Cam boot    Ankle plantarflexion Cam boot    (Blank rows = not tested)  GAIT: Distance walked: 50' Assistive device utilized: None Level of assistance: Complete Independence Comments: no deviation   TODAY'S TREATMENT:  DATE:  05/03/23 Therapeutic Exercise: to improve strength, ROM, flexibility, and endurance  Eliptical L1.0 x 6 min Supine on pool noodle: - chest stretch 2x30" - backstroke 10x5" bil - horizontal ABD 10x5"  NEUROMUSCULAR RE-EDUCATION: To improve proprioception, balance, and kinesthesia. TRX lat pulldown 2 x 10 TRX reverse fly 2 x 10 Kettle bell swing 10lb 2x10- cues to keep weight on ball of feet Trunk rotation blue TB 2x10 bil Pallof press blue TB 2x10 bil Standing shoulder extension 15lb 2x10   04/24/23 EVAL Self Care: Findings, POC, discussion of chiropractic, recommendations to continue to work on as much mobility as possible, review of imaging and findings, importance of regaining as much mobility in hips/hamstrings/quads to decrease pull in low back Therapeutic Exercise: to improve strength and mobility.  Demo, verbal and tactile cues throughout for technique.  Supine Hip Internal and External Rotation  -  10 reps - Bent Knee Fallouts  - 10 reps - Supine Posterior Pelvic Tilt with Knee Rocks  - 10 reps - Supine Posterior Pelvic Tilt  - 10 reps - Hooklying Hamstring Stretch with Strap  - review  - Prone Quadriceps Stretch with Strap  - review   PATIENT EDUCATION:  Education details: HEP, findings, POC Person educated: Patient Education method: Explanation, Demonstration, Verbal cues, and Handouts Education comprehension: verbalized understanding and returned demonstration  HOME EXERCISE PROGRAM: Access Code: [email or web address] URL: [email or web address] Date: 04/24/2023 Prepared by: Harrie Foreman  Exercises - Supine Hip  Internal and External Rotation  - 1 x daily - 7 x weekly - 2 sets - 10 reps - Bent Knee Fallouts  - 1 x daily - 7 x weekly - 2 sets - 10 reps - Supine Posterior Pelvic Tilt with Knee Rocks  - 1 x daily - 7 x weekly - 1 sets - 10 reps - Supine Posterior Pelvic Tilt  - 1 x daily - 7 x weekly - 1 sets - 10 reps - Hooklying Hamstring Stretch with Strap  - 1 x daily - 7 x weekly - 1 sets - 3 reps - 30 sec hold - Prone Quadriceps Stretch with Strap  - 1 x daily - 7 x weekly - 1 sets - 3 reps - 30 sec hold  ASSESSMENT:  CLINICAL IMPRESSION:  Progressed through a series of postural strengthening/stretching and core stabilization exercises. Overall good demo of exercises but cues required to keep weight on balls of his feet with kettle bell swings. His spine remains stiff, so I think he would benefit from some spinal mobilizations. Patient will benefit from skilled PT services to address deficits and return to pain-free function at home and work.   OBJECTIVE IMPAIRMENTS: decreased activity tolerance, decreased mobility, decreased ROM, decreased strength, increased fascial restrictions, impaired perceived functional ability, increased muscle spasms, impaired flexibility, postural dysfunction, and pain.   ACTIVITY LIMITATIONS: lifting, bending, sitting, standing, and squatting  PARTICIPATION LIMITATIONS: meal prep, cleaning, laundry, shopping, and occupation  PERSONAL FACTORS: Past/current experiences, Time since onset of injury/illness/exacerbation, and 1-2 comorbidities: Hx R meniscal tear (2020), hx laminectomy L4-5, DM, R achilles tendonitis, chronic back pain  are also affecting patient's functional outcome.   REHAB POTENTIAL: Good  CLINICAL DECISION MAKING: Evolving/moderate complexity  EVALUATION COMPLEXITY: Moderate   GOALS: Goals reviewed with patient? Yes  SHORT TERM GOALS: Target date: 05/08/2023   Patient will be independent with initial HEP.  Baseline:  Goal status:  INITIAL   LONG TERM GOALS: Target date: 06/19/2023   Patient will be independent with advanced/ongoing HEP to  improve outcomes and carryover.  Baseline:  Goal status: INITIAL  2.  Patient will report 75% improvement in low back pain/stiffness to improve QOL.  Baseline:  Goal status: INITIAL  3.  Patient will demonstrate improved hamstring extensibility by 25 deg bil to decrease pull on low back.  Baseline: measured through popliteal angle Right lacking 70 deg; Left lacking 70 deg  Goal status: INITIAL  4.  Patient will demonstrate improved quad extensibility as being able to bend knees in prone by 15 deg bil.    Baseline: Right 60 deg; Left 70 deg  Goal status: INITIAL  PLAN:  PT FREQUENCY: 1-2x/week  PT DURATION: 8 weeks  PLANNED INTERVENTIONS: 97110-Therapeutic exercises, 97530- Therapeutic activity, 97112- Neuromuscular re-education, 97535- Self Care, 16109- Manual therapy, Taping, Dry Needling, Joint mobilization, Joint manipulation, Spinal manipulation, Spinal mobilization, Cryotherapy, and Moist heat.  PLAN FOR NEXT SESSION: progress/review stretches and lumbar/hip mobility as tolerated   Teshaun Olarte L Honesti Seaberg, PTA 05/03/2023, 11:45 AM

## 2023-05-04 ENCOUNTER — Encounter: Payer: Self-pay | Admitting: Podiatry

## 2023-05-04 ENCOUNTER — Ambulatory Visit (INDEPENDENT_AMBULATORY_CARE_PROVIDER_SITE_OTHER): Admitting: Podiatry

## 2023-05-04 DIAGNOSIS — M7661 Achilles tendinitis, right leg: Secondary | ICD-10-CM | POA: Diagnosis not present

## 2023-05-04 DIAGNOSIS — Q666 Other congenital valgus deformities of feet: Secondary | ICD-10-CM

## 2023-05-04 NOTE — Progress Notes (Signed)
 Subjective:  Patient ID: Anthony Russell, male    DOB: [birth year],  MRN: [id number]  Chief Complaint  Patient presents with   Foot Pain    Follow up achilles tendonitis right   "Its doing better. I haven't walked too much without the boot, but I notice the times I have, the longer walks still bother me"    43 y.o. male presents with the above complaint.  Patient presents with right Achilles tendon insertional pain.  He states the pain is much better cam boot immobilization took care of the pain.  He does not notice much pain when he goes without boot and noticed some pain.   Review of Systems: Negative except as noted in the HPI. Denies N/V/F/Ch.  Past Medical History:  Diagnosis Date   Allergy    Allergy, unspecified not elsewhere classified    rx w/ OTC antihistamines PRN   Anxiety    Asthma    Atypical chest pain    recent neg eval w/ normal cxr/ekg   Depression    Diverticulosis    DM (diabetes mellitus) (HCC)    Dyspepsia    on protonix 40mg /d   Esophagitis    GERD (gastroesophageal reflux disease)    on protonix 40mg /d   HTN (hypertension)    Hyperlipidemia    on diet alone   IBS (irritable bowel syndrome)    Lumbar back pain    s/p lumbar laminectomy 2007 by DrCabbell   Migraines    Overweight(278.02)    weight Jan10=246#.Anthony Russell.he was 225# in 9/07...diet and exercise was discussed   Sleep apnea    not wearing c-pap currently    Current Outpatient Medications:    albuterol (VENTOLIN HFA) 108 (90 Base) MCG/ACT inhaler, INHALE 2 PUFFS INTO THE LUNGS EVERY 6 HOURS AS NEEDED FOR WHEEZING OR SHORTNESS OF BREATH (Patient taking differently: Inhale 2 puffs into the lungs every 6 (six) hours as needed for wheezing or shortness of breath.), Disp: 20.1 g, Rfl: 0   atorvastatin (LIPITOR) 10 MG tablet, TAKE 1 TABLET(10 MG) BY MOUTH DAILY (Patient taking differently: Take 10 mg by mouth daily.), Disp: 30 tablet, Rfl: 4   budesonide-formoterol (SYMBICORT) 160-4.5 MCG/ACT inhaler,  Inhale 2 puffs into the lungs in the morning and at bedtime., Disp: 1 each, Rfl: 12   Continuous Glucose Sensor (FREESTYLE LIBRE 3 SENSOR) MISC, USE SENSOR TO CHECK BLOOD GLUCOSE CONTINUOUSLY, Disp: 3 each, Rfl: 2   fluticasone (FLONASE) 50 MCG/ACT nasal spray, Place 2 sprays into both nostrils daily., Disp: 16 g, Rfl: 1   ibuprofen (ADVIL) 600 MG tablet, Take 1 tablet (600 mg total) by mouth every 8 (eight) hours as needed. (Patient taking differently: Take 600 mg by mouth every 8 (eight) hours as needed for mild pain or moderate pain.), Disp: 60 tablet, Rfl: 1   levocetirizine (XYZAL) 5 MG tablet, Take 1 tablet (5 mg total) by mouth every evening., Disp: 30 tablet, Rfl: 3   metFORMIN (GLUCOPHAGE) 1000 MG tablet, TAKE 1 TABLET(1000 MG) BY MOUTH TWICE DAILY WITH A MEAL (Patient not taking: Reported on 11/09/2022), Disp: 30 tablet, Rfl: 2   Multiple Vitamin (MULTIVITAMIN ADULT PO), Take 2 tablets by mouth daily., Disp: , Rfl:   Social History   Tobacco Use  Smoking Status Former   Current packs/day: 0.00   Average packs/day: 0.1 packs/day for 8.0 years (0.8 ttl pk-yrs)   Types: Cigarettes   Start date: 03/03/2003   Quit date: 03/03/2011   Years since quitting: 12.2  Smokeless  Tobacco Never    No Known Allergies Objective:  There were no vitals filed for this visit. There is no height or weight on file to calculate BMI. Constitutional Well developed. Well nourished.  Vascular Dorsalis pedis pulses palpable bilaterally. Posterior tibial pulses palpable bilaterally. Capillary refill normal to all digits.  No cyanosis or clubbing noted. Pedal hair growth normal.  Neurologic Normal speech. Oriented to person, place, and time. Epicritic sensation to light touch grossly present bilaterally.  Dermatologic Nails well groomed and normal in appearance. No open wounds. No skin lesions.  Orthopedic: Pain on palpation to the right Achilles tendon insertion positive Silfverskiold test noted  positive gastrocnemius equinus noted.  No pain at the peroneal tendon posterior tibial tendon ATFL ligament   Radiographs: None Assessment:   1. Right Achilles tendinitis   2. Pes planovalgus    Plan:  Patient was evaluated and treated and all questions answered.  Right Achilles tendinitis insertional pain -All questions and concerns were discussed with the patient in extensive detail given the amount of pain that she is having clinically his pain is resolved considerably.  He will transition out of cam boot into a Tri-Lock ankle brace Tri-Lock ankle brace was dispensed - I discussed shoe gear modification orthotics management he states understanding would like to proceed with that  Pes planovalgus -I explained to patient the etiology of pes planovalgus and relationship with Planter fasciitis and various treatment options were discussed.  Given patient foot structure in the setting of Planter fasciitis I believe patient will benefit from custom-made orthotics to help control the hindfoot motion support the arch of the foot and take the stress away from plantar fascial.  Patient agrees with the plan like to proceed with orthotics -Patient was casted for orthotics but 1/4 inch heel lift    No follow-ups on file.

## 2023-05-07 ENCOUNTER — Ambulatory Visit: Admitting: Physical Therapy

## 2023-05-07 DIAGNOSIS — R293 Abnormal posture: Secondary | ICD-10-CM | POA: Diagnosis not present

## 2023-05-07 DIAGNOSIS — M25561 Pain in right knee: Secondary | ICD-10-CM | POA: Diagnosis not present

## 2023-05-07 DIAGNOSIS — R252 Cramp and spasm: Secondary | ICD-10-CM | POA: Diagnosis not present

## 2023-05-07 DIAGNOSIS — M5416 Radiculopathy, lumbar region: Secondary | ICD-10-CM

## 2023-05-07 DIAGNOSIS — M5459 Other low back pain: Secondary | ICD-10-CM

## 2023-05-07 DIAGNOSIS — R2689 Other abnormalities of gait and mobility: Secondary | ICD-10-CM

## 2023-05-07 DIAGNOSIS — M6281 Muscle weakness (generalized): Secondary | ICD-10-CM | POA: Diagnosis not present

## 2023-05-07 NOTE — Therapy (Signed)
 OUTPATIENT PHYSICAL THERAPY THORACOLUMBAR TREATMENT   Patient Name: Anthony Russell MRN: [id number] DOB:[birth year], 43 y.o., male Today's Date: 05/07/2023  END OF SESSION:  PT End of Session - 05/07/23 1318     Visit Number 3    Date for PT Re-Evaluation 06/19/23    Authorization Type [employer]    PT Start Time 1318    PT Stop Time 1400    PT Time Calculation (min) 42 min    Activity Tolerance Patient tolerated treatment well    Behavior During Therapy WFL for tasks assessed/performed               Past Medical History:  Diagnosis Date   Allergy    Allergy, unspecified not elsewhere classified    rx w/ OTC antihistamines PRN   Anxiety    Asthma    Atypical chest pain    recent neg eval w/ normal cxr/ekg   Depression    Diverticulosis    DM (diabetes mellitus) (HCC)    Dyspepsia    on protonix 40mg /d   Esophagitis    GERD (gastroesophageal reflux disease)    on protonix 40mg /d   HTN (hypertension)    Hyperlipidemia    on diet alone   IBS (irritable bowel syndrome)    Lumbar back pain    s/p lumbar laminectomy 2007 by DrCabbell   Migraines    Overweight(278.02)    weight Jan10=246#.[name].he was 225# in 9/07...diet and exercise was discussed   Sleep apnea    not wearing c-pap currently   Past Surgical History:  Procedure Laterality Date   DENTAL SURGERY     LUMBAR LAMINECTOMY  01/30/2005   DrCabbell   TEAR DUCT PROBING     Patient Active Problem List   Diagnosis Date Noted   Lumbar radiculopathy 05/10/2022   Capsulitis of left shoulder 05/10/2022   Sinus tachycardia 12/28/2021   Impingement of knee joint, right 12/20/2021   Achilles tendinitis, right leg 12/20/2021   Rotator cuff tendinitis, right 11/04/2021   Encounter for screening for other metabolic disorders 04/07/2021   Dyspnea on exertion 04/07/2021   Migraines 02/23/2021   Lumbar back pain 02/23/2021   HTN (hypertension) 02/23/2021   Dyspepsia 02/23/2021   DM (diabetes mellitus) (HCC) 02/23/2021    Nasal septal deviation 02/17/2021   Laryngitis 01/19/2021   Asymmetric tonsils 12/01/2020   Subacromial bursitis of right shoulder joint 05/17/2020   Hypersomnia 11/03/2015   Obesity 11/03/2015   Allergic rhinitis 04/15/2014   Asthma in adult 04/15/2014   GERD (gastroesophageal reflux disease) 04/15/2014   IBS (irritable bowel syndrome) 04/01/2013   Nausea alone 01/15/2013   Diarrhea 01/15/2013   Depression 10/15/2012   Lesion of eyebrow 02/09/2012   Gastroparesis 07/05/2011   Anxiety 07/05/2010   TESTOSTERONE DEFICIENCY 05/26/2008   Hyperlipidemia 02/06/2008   Facet arthropathy, lumbar 02/06/2008   Atypical chest pain 02/06/2008    PCP: Esperanza Richters, PA-C   REFERRING PROVIDER: Ralene Cork, DO   REFERRING DIAG: [id number] (ICD-10-CM) - Chronic bilateral low back pain without sciatica   Rationale for Evaluation and Treatment: Rehabilitation  THERAPY DIAG:  Other low back pain  Abnormal posture  Cramp and spasm  Acute pain of right knee  Muscle weakness (generalized)  Other abnormalities of gait and mobility  Radiculopathy, lumbar region  ONSET DATE: chronic worsening over last year   SUBJECTIVE:  SUBJECTIVE STATEMENT: Pt states he has more tightness than pain.   PERTINENT HISTORY:  Hx R meniscal tear (2020), hx laminectomy L4-5, DM, R achilles tendonitis, chronic back pain  PAIN:  Are you having pain? Yes: NPRS scale: 0/10 Pain location: low back  Pain description: stiffness, throbbing  Aggravating factors: moving especially after sitting prolong periods Relieving factors: laying down, changing positions  PRECAUTIONS: None  RED FLAGS: None   WEIGHT BEARING RESTRICTIONS: No  FALLS:  Has patient fallen in last 6 months? No  LIVING ENVIRONMENT: Lives  with: lives with their spouse Lives in: House/apartment Stairs: Yes: External: 2 flights steps; on right going up, on left going up, and can reach both Has following equipment at home: None  OCCUPATION: Sport and exercise psychologist, works at home; has sit down and stand up desk   PLOF: Independent and Leisure: boxing   PATIENT GOALS: move easier with better ROM and not hurt as much  NEXT MD VISIT: 08/31/2023 with Rheumatology  OBJECTIVE:   DIAGNOSTIC FINDINGS:  03/21/23 DG Lumbar spine FINDINGS: Substantial lumbar spondylosis with posterior element fusion at L4-L5-S1 and extensive facet spurring at all levels between L2 and S1. Moderate loss of intervertebral disc height at L4-5 and L5-S1.   No subluxation or appreciable fracture.   IMPRESSION: 1. Substantial lumbar spondylosis and degenerative disc disease. 2. Posterior element fusion at L4-L5-S1.  PATIENT SURVEYS:  Modified Oswestry 6/50   COGNITION: Overall cognitive status: Within functional limits for tasks assessed     SENSATION: WFL  MUSCLE LENGTH: Hamstrings: Right 70 deg; Left 70 deg - significant tightness bil Quadriceps in prone: Right 60 deg; Left 70 deg - significant tightness bil   POSTURE: decreased lumbar lordosis and increased thoracic kyphosis  PALPATION: Decreased mobility throughout lumbar spine with PA mobs,   LUMBAR ROM:   AROM eval  Flexion No lumbar flexion, compensates with hip and knee flexion  Extension Limited 90%, compensating with hip extension  Right lateral flexion Limited 90%, compensating hip flexion  Left lateral flexion Limited 90%, compensating hip flexion  Right rotation Limited 90%, compensating hip rotation  Left rotation Limited 90%, compensating hip rotation   (Blank rows = not tested)  LOWER EXTREMITY ROM:     L hip internal rotation 0, R hip internal rotation 16  LOWER EXTREMITY MMT:    MMT Right eval Left eval  Hip flexion 5 5  Hip extension 4 4  Hip abduction 5 5  Hip  adduction 5 5  Knee flexion 5 5  Knee extension 5 5  Ankle dorsiflexion Cam boot   Ankle plantarflexion Cam boot    (Blank rows = not tested)  GAIT: Distance walked: 50' Assistive device utilized: None Level of assistance: Complete Independence Comments: no deviation   TODAY'S TREATMENT:  DATE:  05/07/23 Seated  Pball flexion x5; lateral flexion x5 R&L  Piriformis stretch x 30"  Figure 4 stretch x 30" Standing doorway pec stretch low and at 90 deg of abd 2x30" each Trigger Point Dry Needling  Subsequent Treatment: Instructions provided previously at initial dry needling treatment.   Patient Verbal Consent Given: Yes Education Handout Provided: Previously Provided Muscles Treated: bilat lumbar paraspinals Electrical Stimulation Performed: Yes, Parameters: 2 CPS milliAmps, intensity until muscle twitch x 8 min  Treatment Response/Outcome: Twitch response, decreased muscle tension  Modified quadruped cat/cow x10 Modified quadruped "wag the tail"  Sitting on pball ant/post pelvic tilt x10  05/03/23 Therapeutic Exercise: to improve strength, ROM, flexibility, and endurance  Eliptical L1.0 x 6 min Supine on pool noodle: - chest stretch 2x30" - backstroke 10x5" bil - horizontal ABD 10x5"  NEUROMUSCULAR RE-EDUCATION: To improve proprioception, balance, and kinesthesia. TRX lat pulldown 2 x 10 TRX reverse fly 2 x 10 Kettle bell swing 10lb 2x10- cues to keep weight on ball of feet Trunk rotation blue TB 2x10 bil Pallof press blue TB 2x10 bil Standing shoulder extension 15lb 2x10   04/24/23 EVAL Self Care: Findings, POC, discussion of chiropractic, recommendations to continue to work on as much mobility as possible, review of imaging and findings, importance of regaining as much mobility in hips/hamstrings/quads to decrease pull in low back Therapeutic  Exercise: to improve strength and mobility.  Demo, verbal and tactile cues throughout for technique.  Supine Hip Internal and External Rotation  -  10 reps - Bent Knee Fallouts  - 10 reps - Supine Posterior Pelvic Tilt with Knee Rocks  - 10 reps - Supine Posterior Pelvic Tilt  - 10 reps - Hooklying Hamstring Stretch with Strap  - review  - Prone Quadriceps Stretch with Strap  - review   PATIENT EDUCATION:  Education details: HEP, findings, POC Person educated: Patient Education method: Explanation, Demonstration, Verbal cues, and Handouts Education comprehension: verbalized understanding and returned demonstration  HOME EXERCISE PROGRAM: Access Code: [email or web address] URL: [email or web address] Date: 04/24/2023 Prepared by: Harrie Foreman  Exercises - Supine Hip Internal and External Rotation  - 1 x daily - 7 x weekly - 2 sets - 10 reps - Bent Knee Fallouts  - 1 x daily - 7 x weekly - 2 sets - 10 reps - Supine Posterior Pelvic Tilt with Knee Rocks  - 1 x daily - 7 x weekly - 1 sets - 10 reps - Supine Posterior Pelvic Tilt  - 1 x daily - 7 x weekly - 1 sets - 10 reps - Hooklying Hamstring Stretch with Strap  - 1 x daily - 7 x weekly - 1 sets - 3 reps - 30 sec hold - Prone Quadriceps Stretch with Strap  - 1 x daily - 7 x weekly - 1 sets - 3 reps - 30 sec hold  ASSESSMENT:  CLINICAL IMPRESSION: Performed trial of dry needling with e-stim today to try and improve lumbar muscle extensibility. Continued to work on thoracic and lumbopelvic mobility this session. Working on improving trunk extension and core stability.   OBJECTIVE IMPAIRMENTS: decreased activity tolerance, decreased mobility, decreased ROM, decreased strength, increased fascial restrictions, impaired perceived functional ability, increased muscle spasms, impaired flexibility, postural dysfunction, and pain.   ACTIVITY LIMITATIONS: lifting, bending, sitting, standing, and squatting  PARTICIPATION LIMITATIONS:  meal prep, cleaning, laundry, shopping, and occupation  PERSONAL FACTORS: Past/current experiences, Time since onset of injury/illness/exacerbation, and 1-2 comorbidities: Hx R meniscal tear (2020), hx laminectomy L4-5, DM,  R achilles tendonitis, chronic back pain  are also affecting patient's functional outcome.   REHAB POTENTIAL: Good  CLINICAL DECISION MAKING: Evolving/moderate complexity  EVALUATION COMPLEXITY: Moderate   GOALS: Goals reviewed with patient? Yes  SHORT TERM GOALS: Target date: 05/08/2023   Patient will be independent with initial HEP.  Baseline:  Goal status: INITIAL   LONG TERM GOALS: Target date: 06/19/2023   Patient will be independent with advanced/ongoing HEP to improve outcomes and carryover.  Baseline:  Goal status: INITIAL  2.  Patient will report 75% improvement in low back pain/stiffness to improve QOL.  Baseline:  Goal status: INITIAL  3.  Patient will demonstrate improved hamstring extensibility by 25 deg bil to decrease pull on low back.  Baseline: measured through popliteal angle Right lacking 70 deg; Left lacking 70 deg  Goal status: INITIAL  4.  Patient will demonstrate improved quad extensibility as being able to bend knees in prone by 15 deg bil.    Baseline: Right 60 deg; Left 70 deg  Goal status: INITIAL  PLAN:  PT FREQUENCY: 1-2x/week  PT DURATION: 8 weeks  PLANNED INTERVENTIONS: 97110-Therapeutic exercises, 97530- Therapeutic activity, 97112- Neuromuscular re-education, 97535- Self Care, 16109- Manual therapy, Taping, Dry Needling, Joint mobilization, Joint manipulation, Spinal manipulation, Spinal mobilization, Cryotherapy, and Moist heat.  PLAN FOR NEXT SESSION: progress/review stretches and lumbar/hip mobility as tolerated   Racer Quam April Ma L Fawna Cranmer, PT 05/07/2023, 1:18 PM

## 2023-05-10 ENCOUNTER — Ambulatory Visit

## 2023-05-10 DIAGNOSIS — M6281 Muscle weakness (generalized): Secondary | ICD-10-CM | POA: Diagnosis not present

## 2023-05-10 DIAGNOSIS — R293 Abnormal posture: Secondary | ICD-10-CM | POA: Diagnosis not present

## 2023-05-10 DIAGNOSIS — M5459 Other low back pain: Secondary | ICD-10-CM | POA: Diagnosis not present

## 2023-05-10 DIAGNOSIS — R2689 Other abnormalities of gait and mobility: Secondary | ICD-10-CM | POA: Diagnosis not present

## 2023-05-10 DIAGNOSIS — R252 Cramp and spasm: Secondary | ICD-10-CM | POA: Diagnosis not present

## 2023-05-10 DIAGNOSIS — M5416 Radiculopathy, lumbar region: Secondary | ICD-10-CM | POA: Diagnosis not present

## 2023-05-10 DIAGNOSIS — M25561 Pain in right knee: Secondary | ICD-10-CM | POA: Diagnosis not present

## 2023-05-10 NOTE — Therapy (Signed)
 OUTPATIENT PHYSICAL THERAPY THORACOLUMBAR TREATMENT   Patient Name: Anthony Russell MRN: [id number] DOB:August 31, 1980, 43 y.o., male Today's Date: 05/10/2023  END OF SESSION:  PT End of Session - 05/10/23 1616     Visit Number 4    Date for PT Re-Evaluation 06/19/23    Authorization Type [employer]    PT Start Time 1533    PT Stop Time 1615    PT Time Calculation (min) 42 min    Activity Tolerance Patient tolerated treatment well    Behavior During Therapy WFL for tasks assessed/performed                Past Medical History:  Diagnosis Date   Allergy    Allergy, unspecified not elsewhere classified    rx w/ OTC antihistamines PRN   Anxiety    Asthma    Atypical chest pain    recent neg eval w/ normal cxr/ekg   Depression    Diverticulosis    DM (diabetes mellitus) (HCC)    Dyspepsia    on protonix 40mg /d   Esophagitis    GERD (gastroesophageal reflux disease)    on protonix 40mg /d   HTN (hypertension)    Hyperlipidemia    on diet alone   IBS (irritable bowel syndrome)    Lumbar back pain    s/p lumbar laminectomy 2007 by DrCabbell   Migraines    Overweight(278.02)    weight Jan10=246#.[name].he was 225# in 9/07...diet and exercise was discussed   Sleep apnea    not wearing c-pap currently   Past Surgical History:  Procedure Laterality Date   DENTAL SURGERY     LUMBAR LAMINECTOMY  01/30/2005   DrCabbell   TEAR DUCT PROBING     Patient Active Problem List   Diagnosis Date Noted   Lumbar radiculopathy 05/10/2022   Capsulitis of left shoulder 05/10/2022   Sinus tachycardia 12/28/2021   Impingement of knee joint, right 12/20/2021   Achilles tendinitis, right leg 12/20/2021   Rotator cuff tendinitis, right 11/04/2021   Encounter for screening for other metabolic disorders 04/07/2021   Dyspnea on exertion 04/07/2021   Migraines 02/23/2021   Lumbar back pain 02/23/2021   HTN (hypertension) 02/23/2021   Dyspepsia 02/23/2021   DM (diabetes mellitus) (HCC)  02/23/2021   Nasal septal deviation 02/17/2021   Laryngitis 01/19/2021   Asymmetric tonsils 12/01/2020   Subacromial bursitis of right shoulder joint 05/17/2020   Hypersomnia 11/03/2015   Obesity 11/03/2015   Allergic rhinitis 04/15/2014   Asthma in adult 04/15/2014   GERD (gastroesophageal reflux disease) 04/15/2014   IBS (irritable bowel syndrome) 04/01/2013   Nausea alone 01/15/2013   Diarrhea 01/15/2013   Depression 10/15/2012   Lesion of eyebrow 02/09/2012   Gastroparesis 07/05/2011   Anxiety 07/05/2010   TESTOSTERONE DEFICIENCY 05/26/2008   Hyperlipidemia 02/06/2008   Facet arthropathy, lumbar 02/06/2008   Atypical chest pain 02/06/2008    PCP: Marisue Brooklyn   REFERRING PROVIDER: Ralene Cork, DO   REFERRING DIAG: [phone] (ICD-10-CM) - Chronic bilateral low back pain without sciatica   Rationale for Evaluation and Treatment: Rehabilitation  THERAPY DIAG:  Other low back pain  Abnormal posture  Cramp and spasm  ONSET DATE: chronic worsening over last year   SUBJECTIVE:  SUBJECTIVE STATEMENT: Doing better today, he went to chiropractor yesterday.  PERTINENT HISTORY:  Hx R meniscal tear (2020), hx laminectomy L4-5, DM, R achilles tendonitis, chronic back pain  PAIN:  Are you having pain? Yes: NPRS scale: 0/10 Pain location: low back  Pain description: stiffness, throbbing  Aggravating factors: moving especially after sitting prolong periods Relieving factors: laying down, changing positions  PRECAUTIONS: None  RED FLAGS: None   WEIGHT BEARING RESTRICTIONS: No  FALLS:  Has patient fallen in last 6 months? No  LIVING ENVIRONMENT: Lives with: lives with their spouse Lives in: House/apartment Stairs: Yes: External: 2 flights steps; on right going up,  on left going up, and can reach both Has following equipment at home: None  OCCUPATION: Sport and exercise psychologist, works at home; has sit down and stand up desk   PLOF: Independent and Leisure: boxing   PATIENT GOALS: move easier with better ROM and not hurt as much  NEXT MD VISIT: 08/31/2023 with Rheumatology  OBJECTIVE:   DIAGNOSTIC FINDINGS:  03/21/23 DG Lumbar spine FINDINGS: Substantial lumbar spondylosis with posterior element fusion at L4-L5-S1 and extensive facet spurring at all levels between L2 and S1. Moderate loss of intervertebral disc height at L4-5 and L5-S1.   No subluxation or appreciable fracture.   IMPRESSION: 1. Substantial lumbar spondylosis and degenerative disc disease. 2. Posterior element fusion at L4-L5-S1.  PATIENT SURVEYS:  Modified Oswestry 6/50   COGNITION: Overall cognitive status: Within functional limits for tasks assessed     SENSATION: WFL  MUSCLE LENGTH: Hamstrings: Right 70 deg; Left 70 deg - significant tightness bil Quadriceps in prone: Right 60 deg; Left 70 deg - significant tightness bil   POSTURE: decreased lumbar lordosis and increased thoracic kyphosis  PALPATION: Decreased mobility throughout lumbar spine with PA mobs,   LUMBAR ROM:   AROM eval  Flexion No lumbar flexion, compensates with hip and knee flexion  Extension Limited 90%, compensating with hip extension  Right lateral flexion Limited 90%, compensating hip flexion  Left lateral flexion Limited 90%, compensating hip flexion  Right rotation Limited 90%, compensating hip rotation  Left rotation Limited 90%, compensating hip rotation   (Blank rows = not tested)  LOWER EXTREMITY ROM:     L hip internal rotation 0, R hip internal rotation 16  LOWER EXTREMITY MMT:    MMT Right eval Left eval  Hip flexion 5 5  Hip extension 4 4  Hip abduction 5 5  Hip adduction 5 5  Knee flexion 5 5  Knee extension 5 5  Ankle dorsiflexion Cam boot   Ankle plantarflexion Cam boot     (Blank rows = not tested)  GAIT: Distance walked: 50' Assistive device utilized: None Level of assistance: Complete Independence Comments: no deviation   TODAY'S TREATMENT:                                                                                                                              DATE:  05/10/23 Eliptical L1.0 x 6 min Supine LTR 5x10" both ways Open books x 10 bil NEUROMUSCULAR RE-EDUCATION: To improve proprioception, balance, and kinesthesia. TRX lat pulldown 2 x 10 TRX reverse fly 2x10 Ladder walk ups to top x 10 Ladder push ups x 10  Standing shoulder ext RTB - inc strain on R triceps  05/07/23 Seated  Pball flexion x5; lateral flexion x5 R&L  Piriformis stretch x 30"  Figure 4 stretch x 30" Standing doorway pec stretch low and at 90 deg of abd 2x30" each Trigger Point Dry Needling  Subsequent Treatment: Instructions provided previously at initial dry needling treatment.   Patient Verbal Consent Given: Yes Education Handout Provided: Previously Provided Muscles Treated: bilat lumbar paraspinals Electrical Stimulation Performed: Yes, Parameters: 2 CPS milliAmps, intensity until muscle twitch x 8 min  Treatment Response/Outcome: Twitch response, decreased muscle tension  Modified quadruped cat/cow x10 Modified quadruped "wag the tail"  Sitting on pball ant/post pelvic tilt x10  05/03/23 Therapeutic Exercise: to improve strength, ROM, flexibility, and endurance  Eliptical L1.0 x 6 min Supine on pool noodle: - chest stretch 2x30" - backstroke 10x5" bil - horizontal ABD 10x5"  NEUROMUSCULAR RE-EDUCATION: To improve proprioception, balance, and kinesthesia. TRX lat pulldown 2 x 10 TRX reverse fly 2 x 10 Kettle bell swing 10lb 2x10- cues to keep weight on ball of feet Trunk rotation blue TB 2x10 bil Pallof press blue TB 2x10 bil Standing shoulder extension 15lb 2x10   04/24/23 EVAL Self Care: Findings, POC, discussion of chiropractic,  recommendations to continue to work on as much mobility as possible, review of imaging and findings, importance of regaining as much mobility in hips/hamstrings/quads to decrease pull in low back Therapeutic Exercise: to improve strength and mobility.  Demo, verbal and tactile cues throughout for technique.  Supine Hip Internal and External Rotation  -  10 reps - Bent Knee Fallouts  - 10 reps - Supine Posterior Pelvic Tilt with Knee Rocks  - 10 reps - Supine Posterior Pelvic Tilt  - 10 reps - Hooklying Hamstring Stretch with Strap  - review  - Prone Quadriceps Stretch with Strap  - review   PATIENT EDUCATION:  Education details: HEP, findings, POC Person educated: Patient Education method: Explanation, Demonstration, Verbal cues, and Handouts Education comprehension: verbalized understanding and returned demonstration  HOME EXERCISE PROGRAM: Access Code: [email or web address] URL: [email or web address] Date: 04/24/2023 Prepared by: Harrie Foreman  Exercises - Supine Hip Internal and External Rotation  - 1 x daily - 7 x weekly - 2 sets - 10 reps - Bent Knee Fallouts  - 1 x daily - 7 x weekly - 2 sets - 10 reps - Supine Posterior Pelvic Tilt with Knee Rocks  - 1 x daily - 7 x weekly - 1 sets - 10 reps - Supine Posterior Pelvic Tilt  - 1 x daily - 7 x weekly - 1 sets - 10 reps - Hooklying Hamstring Stretch with Strap  - 1 x daily - 7 x weekly - 1 sets - 3 reps - 30 sec hold - Prone Quadriceps Stretch with Strap  - 1 x daily - 7 x weekly - 1 sets - 3 reps - 30 sec hold  ASSESSMENT:  CLINICAL IMPRESSION: Pt notes improved pain today from DN and also chiropractor visit. He continues to show stiffness overall with many different movements. He would continue to benefit from more stretching and DN to improve his overall mobility. Postural strengthening continued to improve his overall spinals alignment.   OBJECTIVE IMPAIRMENTS:  decreased activity tolerance, decreased mobility, decreased  ROM, decreased strength, increased fascial restrictions, impaired perceived functional ability, increased muscle spasms, impaired flexibility, postural dysfunction, and pain.   ACTIVITY LIMITATIONS: lifting, bending, sitting, standing, and squatting  PARTICIPATION LIMITATIONS: meal prep, cleaning, laundry, shopping, and occupation  PERSONAL FACTORS: Past/current experiences, Time since onset of injury/illness/exacerbation, and 1-2 comorbidities: Hx R meniscal tear (2020), hx laminectomy L4-5, DM, R achilles tendonitis, chronic back pain  are also affecting patient's functional outcome.   REHAB POTENTIAL: Good  CLINICAL DECISION MAKING: Evolving/moderate complexity  EVALUATION COMPLEXITY: Moderate   GOALS: Goals reviewed with patient? Yes  SHORT TERM GOALS: Target date: 05/08/2023   Patient will be independent with initial HEP.  Baseline:  Goal status: INITIAL   LONG TERM GOALS: Target date: 06/19/2023   Patient will be independent with advanced/ongoing HEP to improve outcomes and carryover.  Baseline:  Goal status: INITIAL  2.  Patient will report 75% improvement in low back pain/stiffness to improve QOL.  Baseline:  Goal status: INITIAL  3.  Patient will demonstrate improved hamstring extensibility by 25 deg bil to decrease pull on low back.  Baseline: measured through popliteal angle Right lacking 70 deg; Left lacking 70 deg  Goal status: INITIAL  4.  Patient will demonstrate improved quad extensibility as being able to bend knees in prone by 15 deg bil.    Baseline: Right 60 deg; Left 70 deg  Goal status: INITIAL  PLAN:  PT FREQUENCY: 1-2x/week  PT DURATION: 8 weeks  PLANNED INTERVENTIONS: 97110-Therapeutic exercises, 97530- Therapeutic activity, 97112- Neuromuscular re-education, 97535- Self Care, [id number]- Manual therapy, Taping, Dry Needling, Joint mobilization, Joint manipulation, Spinal manipulation, Spinal mobilization, Cryotherapy, and Moist heat.  PLAN FOR NEXT  SESSION: progress/review stretches and lumbar/hip mobility as tolerated   Darleene Cleaver, PTA 05/10/2023, 4:17 PM

## 2023-05-14 ENCOUNTER — Ambulatory Visit: Admitting: Physical Therapy

## 2023-05-14 DIAGNOSIS — M25561 Pain in right knee: Secondary | ICD-10-CM | POA: Diagnosis not present

## 2023-05-14 DIAGNOSIS — R293 Abnormal posture: Secondary | ICD-10-CM

## 2023-05-14 DIAGNOSIS — M5459 Other low back pain: Secondary | ICD-10-CM | POA: Diagnosis not present

## 2023-05-14 DIAGNOSIS — R2689 Other abnormalities of gait and mobility: Secondary | ICD-10-CM

## 2023-05-14 DIAGNOSIS — M5416 Radiculopathy, lumbar region: Secondary | ICD-10-CM | POA: Diagnosis not present

## 2023-05-14 DIAGNOSIS — M6281 Muscle weakness (generalized): Secondary | ICD-10-CM

## 2023-05-14 DIAGNOSIS — R252 Cramp and spasm: Secondary | ICD-10-CM

## 2023-05-14 NOTE — Therapy (Signed)
 OUTPATIENT PHYSICAL THERAPY THORACOLUMBAR TREATMENT   Patient Name: Anthony Russell MRN: [id number] DOB:[birth year], 43 y.o., male Today's Date: 05/14/2023  END OF SESSION:  PT End of Session - 05/14/23 1403     Visit Number 5    Date for PT Re-Evaluation 06/19/23    Authorization Type [employer]    PT Start Time 1403    PT Stop Time 1442    PT Time Calculation (min) 39 min    Activity Tolerance Patient tolerated treatment well    Behavior During Therapy WFL for tasks assessed/performed                 Past Medical History:  Diagnosis Date   Allergy    Allergy, unspecified not elsewhere classified    rx w/ OTC antihistamines PRN   Anxiety    Asthma    Atypical chest pain    recent neg eval w/ normal cxr/ekg   Depression    Diverticulosis    DM (diabetes mellitus) (HCC)    Dyspepsia    on protonix 40mg /d   Esophagitis    GERD (gastroesophageal reflux disease)    on protonix 40mg /d   HTN (hypertension)    Hyperlipidemia    on diet alone   IBS (irritable bowel syndrome)    Lumbar back pain    s/p lumbar laminectomy 2007 by DrCabbell   Migraines    Overweight(278.02)    weight Jan10=246#.[name].he was 225# in 9/07...diet and exercise was discussed   Sleep apnea    not wearing c-pap currently   Past Surgical History:  Procedure Laterality Date   DENTAL SURGERY     LUMBAR LAMINECTOMY  01/30/2005   DrCabbell   TEAR DUCT PROBING     Patient Active Problem List   Diagnosis Date Noted   Lumbar radiculopathy 05/10/2022   Capsulitis of left shoulder 05/10/2022   Sinus tachycardia 12/28/2021   Impingement of knee joint, right 12/20/2021   Achilles tendinitis, right leg 12/20/2021   Rotator cuff tendinitis, right 11/04/2021   Encounter for screening for other metabolic disorders 04/07/2021   Dyspnea on exertion 04/07/2021   Migraines 02/23/2021   Lumbar back pain 02/23/2021   HTN (hypertension) 02/23/2021   Dyspepsia 02/23/2021   DM (diabetes mellitus) (HCC)  02/23/2021   Nasal septal deviation 02/17/2021   Laryngitis 01/19/2021   Asymmetric tonsils 12/01/2020   Subacromial bursitis of right shoulder joint 05/17/2020   Hypersomnia 11/03/2015   Obesity 11/03/2015   Allergic rhinitis 04/15/2014   Asthma in adult 04/15/2014   GERD (gastroesophageal reflux disease) 04/15/2014   IBS (irritable bowel syndrome) 04/01/2013   Nausea alone 01/15/2013   Diarrhea 01/15/2013   Depression 10/15/2012   Lesion of eyebrow 02/09/2012   Gastroparesis 07/05/2011   Anxiety 07/05/2010   TESTOSTERONE DEFICIENCY 05/26/2008   Hyperlipidemia 02/06/2008   Facet arthropathy, lumbar 02/06/2008   Atypical chest pain 02/06/2008    PCP: Esperanza Richters, PA-C   REFERRING PROVIDER: Ralene Cork, DO   REFERRING DIAG: [phone] (ICD-10-CM) - Chronic bilateral low back pain without sciatica   Rationale for Evaluation and Treatment: Rehabilitation  THERAPY DIAG:  Other low back pain  Abnormal posture  Cramp and spasm  Muscle weakness (generalized)  Other abnormalities of gait and mobility  ONSET DATE: chronic worsening over last year   SUBJECTIVE:  SUBJECTIVE STATEMENT: Pt states he's doing okay today. Will be going to chiropractor again today. Pt states the needling did help.   PERTINENT HISTORY:  Hx R meniscal tear (2020), hx laminectomy L4-5, DM, R achilles tendonitis, chronic back pain  PAIN:  Are you having pain? Yes: NPRS scale: 0/10 Pain location: low back  Pain description: stiffness, throbbing  Aggravating factors: moving especially after sitting prolong periods Relieving factors: laying down, changing positions  PRECAUTIONS: None  RED FLAGS: None   WEIGHT BEARING RESTRICTIONS: No  FALLS:  Has patient fallen in last 6 months? No  LIVING  ENVIRONMENT: Lives with: lives with their spouse Lives in: House/apartment Stairs: Yes: External: 2 flights steps; on right going up, on left going up, and can reach both Has following equipment at home: None  OCCUPATION: Sport and exercise psychologist, works at home; has sit down and stand up desk   PLOF: Independent and Leisure: boxing   PATIENT GOALS: move easier with better ROM and not hurt as much  NEXT MD VISIT: 08/31/2023 with Rheumatology  OBJECTIVE:   DIAGNOSTIC FINDINGS:  03/21/23 DG Lumbar spine FINDINGS: Substantial lumbar spondylosis with posterior element fusion at L4-L5-S1 and extensive facet spurring at all levels between L2 and S1. Moderate loss of intervertebral disc height at L4-5 and L5-S1.   No subluxation or appreciable fracture.   IMPRESSION: 1. Substantial lumbar spondylosis and degenerative disc disease. 2. Posterior element fusion at L4-L5-S1.  PATIENT SURVEYS:  Modified Oswestry 6/50   COGNITION: Overall cognitive status: Within functional limits for tasks assessed     SENSATION: WFL  MUSCLE LENGTH: Hamstrings: Right 70 deg; Left 70 deg - significant tightness bil Quadriceps in prone: Right 60 deg; Left 70 deg - significant tightness bil   POSTURE: decreased lumbar lordosis and increased thoracic kyphosis  PALPATION: Decreased mobility throughout lumbar spine with PA mobs,   LUMBAR ROM:   AROM eval  Flexion No lumbar flexion, compensates with hip and knee flexion  Extension Limited 90%, compensating with hip extension  Right lateral flexion Limited 90%, compensating hip flexion  Left lateral flexion Limited 90%, compensating hip flexion  Right rotation Limited 90%, compensating hip rotation  Left rotation Limited 90%, compensating hip rotation   (Blank rows = not tested)  LOWER EXTREMITY ROM:     L hip internal rotation 0, R hip internal rotation 16  LOWER EXTREMITY MMT:    MMT Right eval Left eval  Hip flexion 5 5  Hip extension 4 4  Hip  abduction 5 5  Hip adduction 5 5  Knee flexion 5 5  Knee extension 5 5  Ankle dorsiflexion Cam boot   Ankle plantarflexion Cam boot    (Blank rows = not tested)  GAIT: Distance walked: 50' Assistive device utilized: None Level of assistance: Complete Independence Comments: no deviation   TODAY'S TREATMENT:  DATE: 05/14/23 Doorway pec stretch low, mid, high x30" each Thoracic ext in chair x10 Seated lateral flexion x2 slowly one vertebrae at home Seated thoracic rotation x3 Seated thread the needle x3 Seated scap squeeze 2x10 Trigger Point Dry Needling  Subsequent Treatment: Instructions provided previously at initial dry needling treatment.   Patient Verbal Consent Given: Yes Education Handout Provided: Previously Provided Muscles Treated: bilat lumbar paraspinals Electrical Stimulation Performed: Yes, Parameters: 2 CPS milliAmps, intensity until muscle twitch x 8 min Treatment Response/Outcome: Decreased muscle tension STM & TPR bilat thoracic paraspinals, periscapular muscles Grade II to III thoracic PAs and UPAs Standing counter lumbar ext x10 Standing shoulder ext green TB reactive iso 2x10 Standing row green TB reactive iso 2x10   05/10/23 Eliptical L1.0 x 6 min Supine LTR 5x10" both ways Open books x 10 bil NEUROMUSCULAR RE-EDUCATION: To improve proprioception, balance, and kinesthesia. TRX lat pulldown 2 x 10 TRX reverse fly 2x10 Ladder walk ups to top x 10 Ladder push ups x 10  Standing shoulder ext RTB - inc strain on R triceps  05/07/23 Seated  Pball flexion x5; lateral flexion x5 R&L  Piriformis stretch x 30"  Figure 4 stretch x 30" Standing doorway pec stretch low and at 90 deg of abd 2x30" each Trigger Point Dry Needling  Subsequent Treatment: Instructions provided previously at initial dry needling treatment.   Patient  Verbal Consent Given: Yes Education Handout Provided: Previously Provided Muscles Treated: bilat lumbar paraspinals Electrical Stimulation Performed: Yes, Parameters: 2 CPS milliAmps, intensity until muscle twitch x 8 min  Treatment Response/Outcome: Twitch response, decreased muscle tension  Modified quadruped cat/cow x10 Modified quadruped "wag the tail"  Sitting on pball ant/post pelvic tilt x10  05/03/23 Therapeutic Exercise: to improve strength, ROM, flexibility, and endurance  Eliptical L1.0 x 6 min Supine on pool noodle: - chest stretch 2x30" - backstroke 10x5" bil - horizontal ABD 10x5"  NEUROMUSCULAR RE-EDUCATION: To improve proprioception, balance, and kinesthesia. TRX lat pulldown 2 x 10 TRX reverse fly 2 x 10 Kettle bell swing 10lb 2x10- cues to keep weight on ball of feet Trunk rotation blue TB 2x10 bil Pallof press blue TB 2x10 bil Standing shoulder extension 15lb 2x10   04/24/23 EVAL Self Care: Findings, POC, discussion of chiropractic, recommendations to continue to work on as much mobility as possible, review of imaging and findings, importance of regaining as much mobility in hips/hamstrings/quads to decrease pull in low back Therapeutic Exercise: to improve strength and mobility.  Demo, verbal and tactile cues throughout for technique.  Supine Hip Internal and External Rotation  -  10 reps - Bent Knee Fallouts  - 10 reps - Supine Posterior Pelvic Tilt with Knee Rocks  - 10 reps - Supine Posterior Pelvic Tilt  - 10 reps - Hooklying Hamstring Stretch with Strap  - review  - Prone Quadriceps Stretch with Strap  - review   PATIENT EDUCATION:  Education details: HEP, findings, POC Person educated: Patient Education method: Explanation, Demonstration, Verbal cues, and Handouts Education comprehension: verbalized understanding and returned demonstration  HOME EXERCISE PROGRAM: Access Code: [email or web address] URL: [email or web address] Date:  05/14/2023 Prepared by: Suzane Vanderweide April Erman [place]  Exercises - Bent Knee Fallouts  - 1 x daily - 7 x weekly - 2 sets - 10 reps - Supine Posterior Pelvic Tilt with Knee Rocks  - 1 x daily - 7 x weekly - 1 sets - 10 reps - Supine Posterior Pelvic Tilt  - 1 x daily - 7 x weekly - 1  sets - 10 reps - Hooklying Hamstring Stretch with Strap  - 1 x daily - 7 x weekly - 1 sets - 3 reps - 30 sec hold - Prone Quadriceps Stretch with Strap  - 1 x daily - 7 x weekly - 1 sets - 3 reps - 30 sec hold - Seated Figure 4 Piriformis Stretch  - 1 x daily - 7 x weekly - 2 sets - 30 sec hold - Seated Piriformis Stretch  - 1 x daily - 7 x weekly - 2 sets - 30 sec hold - Seated Thoracic Lumbar Extension with Pectoralis Stretch  - 1 x daily - 7 x weekly - 1 sets - 10 reps - Modified Cat Cow at Chair  - 1 x daily - 7 x weekly - 1 sets - 10 reps - Shoulder Extension Reactive Isometrics with Elbow Extended  - 1 x daily - 7 x weekly - 2 sets - 10 reps - Standing Shoulder Row Reactive Isometric  - 1 x daily - 7 x weekly - 2 sets - 10 reps  ASSESSMENT:  CLINICAL IMPRESSION: Continued manual work and dry needling as pt had a good response. Improving thoracolumbar mobility but pt remains hypomobile. Working on midback and postural stability to try and maintain trunk extension with standing tasks.   OBJECTIVE IMPAIRMENTS: decreased activity tolerance, decreased mobility, decreased ROM, decreased strength, increased fascial restrictions, impaired perceived functional ability, increased muscle spasms, impaired flexibility, postural dysfunction, and pain.   ACTIVITY LIMITATIONS: lifting, bending, sitting, standing, and squatting  PARTICIPATION LIMITATIONS: meal prep, cleaning, laundry, shopping, and occupation  PERSONAL FACTORS: Past/current experiences, Time since onset of injury/illness/exacerbation, and 1-2 comorbidities: Hx R meniscal tear (2020), hx laminectomy L4-5, DM, R achilles tendonitis, chronic back pain  are also  affecting patient's functional outcome.   REHAB POTENTIAL: Good  CLINICAL DECISION MAKING: Evolving/moderate complexity  EVALUATION COMPLEXITY: Moderate   GOALS: Goals reviewed with patient? Yes  SHORT TERM GOALS: Target date: 05/08/2023   Patient will be independent with initial HEP.  Baseline:  Goal status: INITIAL   LONG TERM GOALS: Target date: 06/19/2023   Patient will be independent with advanced/ongoing HEP to improve outcomes and carryover.  Baseline:  Goal status: INITIAL  2.  Patient will report 75% improvement in low back pain/stiffness to improve QOL.  Baseline:  Goal status: INITIAL  3.  Patient will demonstrate improved hamstring extensibility by 25 deg bil to decrease pull on low back.  Baseline: measured through popliteal angle Right lacking 70 deg; Left lacking 70 deg  Goal status: INITIAL  4.  Patient will demonstrate improved quad extensibility as being able to bend knees in prone by 15 deg bil.    Baseline: Right 60 deg; Left 70 deg  Goal status: INITIAL  PLAN:  PT FREQUENCY: 1-2x/week  PT DURATION: 8 weeks  PLANNED INTERVENTIONS: 97110-Therapeutic exercises, 97530- Therapeutic activity, 97112- Neuromuscular re-education, 97535- Self Care, 91478- Manual therapy, Taping, Dry Needling, Joint mobilization, Joint manipulation, Spinal manipulation, Spinal mobilization, Cryotherapy, and Moist heat.  PLAN FOR NEXT SESSION: progress/review stretches and lumbar/hip mobility as tolerated. Work on trunk extension/posterior shoulder/midback strengthening and core to decrease trunk flexion in standing   Kandee Escalante April Ma L [place], PT 05/14/2023, 2:31 PM

## 2023-05-17 ENCOUNTER — Ambulatory Visit

## 2023-05-17 DIAGNOSIS — R252 Cramp and spasm: Secondary | ICD-10-CM | POA: Diagnosis not present

## 2023-05-17 DIAGNOSIS — R293 Abnormal posture: Secondary | ICD-10-CM

## 2023-05-17 DIAGNOSIS — M5459 Other low back pain: Secondary | ICD-10-CM

## 2023-05-17 DIAGNOSIS — R2689 Other abnormalities of gait and mobility: Secondary | ICD-10-CM | POA: Diagnosis not present

## 2023-05-17 DIAGNOSIS — M6281 Muscle weakness (generalized): Secondary | ICD-10-CM

## 2023-05-17 DIAGNOSIS — M5416 Radiculopathy, lumbar region: Secondary | ICD-10-CM | POA: Diagnosis not present

## 2023-05-17 DIAGNOSIS — M25561 Pain in right knee: Secondary | ICD-10-CM | POA: Diagnosis not present

## 2023-05-17 NOTE — Therapy (Signed)
 OUTPATIENT PHYSICAL THERAPY THORACOLUMBAR TREATMENT   Patient Name: Anthony Russell MRN: [id number] DOB:08/23/1980, 43 y.o., male Today's Date: 05/17/2023  END OF SESSION:  PT End of Session - 05/17/23 1321     Visit Number 6    Date for PT Re-Evaluation 06/19/23    Authorization Type [employer]    PT Start Time 1317    PT Stop Time 1357    PT Time Calculation (min) 40 min    Activity Tolerance Patient tolerated treatment well    Behavior During Therapy WFL for tasks assessed/performed                  Past Medical History:  Diagnosis Date   Allergy    Allergy, unspecified not elsewhere classified    rx w/ OTC antihistamines PRN   Anxiety    Asthma    Atypical chest pain    recent neg eval w/ normal cxr/ekg   Depression    Diverticulosis    DM (diabetes mellitus) (HCC)    Dyspepsia    on protonix 40mg /d   Esophagitis    GERD (gastroesophageal reflux disease)    on protonix 40mg /d   HTN (hypertension)    Hyperlipidemia    on diet alone   IBS (irritable bowel syndrome)    Lumbar back pain    s/p lumbar laminectomy 2007 by DrCabbell   Migraines    Overweight(278.02)    weight Jan10=246#.[name].he was 225# in 9/07...diet and exercise was discussed   Sleep apnea    not wearing c-pap currently   Past Surgical History:  Procedure Laterality Date   DENTAL SURGERY     LUMBAR LAMINECTOMY  01/30/2005   DrCabbell   TEAR DUCT PROBING     Patient Active Problem List   Diagnosis Date Noted   Lumbar radiculopathy 05/10/2022   Capsulitis of left shoulder 05/10/2022   Sinus tachycardia 12/28/2021   Impingement of knee joint, right 12/20/2021   Achilles tendinitis, right leg 12/20/2021   Rotator cuff tendinitis, right 11/04/2021   Encounter for screening for other metabolic disorders 04/07/2021   Dyspnea on exertion 04/07/2021   Migraines 02/23/2021   Lumbar back pain 02/23/2021   HTN (hypertension) 02/23/2021   Dyspepsia 02/23/2021   DM (diabetes mellitus) (HCC)  02/23/2021   Nasal septal deviation 02/17/2021   Laryngitis 01/19/2021   Asymmetric tonsils 12/01/2020   Subacromial bursitis of right shoulder joint 05/17/2020   Hypersomnia 11/03/2015   Obesity 11/03/2015   Allergic rhinitis 04/15/2014   Asthma in adult 04/15/2014   GERD (gastroesophageal reflux disease) 04/15/2014   IBS (irritable bowel syndrome) 04/01/2013   Nausea alone 01/15/2013   Diarrhea 01/15/2013   Depression 10/15/2012   Lesion of eyebrow 02/09/2012   Gastroparesis 07/05/2011   Anxiety 07/05/2010   TESTOSTERONE DEFICIENCY 05/26/2008   Hyperlipidemia 02/06/2008   Facet arthropathy, lumbar 02/06/2008   Atypical chest pain 02/06/2008    PCP: Esperanza Richters, PA-C   REFERRING PROVIDER: Ralene Cork, DO   REFERRING DIAG: [id number] (ICD-10-CM) - Chronic bilateral low back pain without sciatica   Rationale for Evaluation and Treatment: Rehabilitation  THERAPY DIAG:  Other low back pain  Abnormal posture  Cramp and spasm  Muscle weakness (generalized)  Other abnormalities of gait and mobility  ONSET DATE: chronic worsening over last year   SUBJECTIVE:  SUBJECTIVE STATEMENT: Pt states he's doing okay today. Will be going to chiropractor again today. Pt states the needling did help.   PERTINENT HISTORY:  Hx R meniscal tear (2020), hx laminectomy L4-5, DM, R achilles tendonitis, chronic back pain  PAIN:  Are you having pain? Yes: NPRS scale: 0/10 Pain location: low back  Pain description: stiffness, throbbing  Aggravating factors: moving especially after sitting prolong periods Relieving factors: laying down, changing positions  PRECAUTIONS: None  RED FLAGS: None   WEIGHT BEARING RESTRICTIONS: No  FALLS:  Has patient fallen in last 6 months? No  LIVING  ENVIRONMENT: Lives with: lives with their spouse Lives in: House/apartment Stairs: Yes: External: 2 flights steps; on right going up, on left going up, and can reach both Has following equipment at home: None  OCCUPATION: Sport and exercise psychologist, works at home; has sit down and stand up desk   PLOF: Independent and Leisure: boxing   PATIENT GOALS: move easier with better ROM and not hurt as much  NEXT MD VISIT: 08/31/2023 with Rheumatology  OBJECTIVE:   DIAGNOSTIC FINDINGS:  03/21/23 DG Lumbar spine FINDINGS: Substantial lumbar spondylosis with posterior element fusion at L4-L5-S1 and extensive facet spurring at all levels between L2 and S1. Moderate loss of intervertebral disc height at L4-5 and L5-S1.   No subluxation or appreciable fracture.   IMPRESSION: 1. Substantial lumbar spondylosis and degenerative disc disease. 2. Posterior element fusion at L4-L5-S1.  PATIENT SURVEYS:  Modified Oswestry 6/50   COGNITION: Overall cognitive status: Within functional limits for tasks assessed     SENSATION: WFL  MUSCLE LENGTH: Hamstrings: Right 70 deg; Left 70 deg - significant tightness bil Quadriceps in prone: Right 60 deg; Left 70 deg - significant tightness bil   POSTURE: decreased lumbar lordosis and increased thoracic kyphosis  PALPATION: Decreased mobility throughout lumbar spine with PA mobs,   LUMBAR ROM:   AROM eval  Flexion No lumbar flexion, compensates with hip and knee flexion  Extension Limited 90%, compensating with hip extension  Right lateral flexion Limited 90%, compensating hip flexion  Left lateral flexion Limited 90%, compensating hip flexion  Right rotation Limited 90%, compensating hip rotation  Left rotation Limited 90%, compensating hip rotation   (Blank rows = not tested)  LOWER EXTREMITY ROM:     L hip internal rotation 0, R hip internal rotation 16  LOWER EXTREMITY MMT:    MMT Right eval Left eval  Hip flexion 5 5  Hip extension 4 4  Hip  abduction 5 5  Hip adduction 5 5  Knee flexion 5 5  Knee extension 5 5  Ankle dorsiflexion Cam boot   Ankle plantarflexion Cam boot    (Blank rows = not tested)  GAIT: Distance walked: 50' Assistive device utilized: None Level of assistance: Complete Independence Comments: no deviation   TODAY'S TREATMENT:  DATE: 05/17/23 Eliptical L3.0 x 6 min Lat pulls 25lb 2x10 Trunk rotations 5lb x 10 bil Kneeling chest supported on green pball: -I, T, Y, W 2x10 each Open books x 10 bil HS stretch with strap supine 2x30" Prone Quad stretch 2x30" bil  05/14/23 Doorway pec stretch low, mid, high x30" each Thoracic ext in chair x10 Seated lateral flexion x2 slowly one vertebrae at home Seated thoracic rotation x3 Seated thread the needle x3 Seated scap squeeze 2x10 Trigger Point Dry Needling  Subsequent Treatment: Instructions provided previously at initial dry needling treatment.   Patient Verbal Consent Given: Yes Education Handout Provided: Previously Provided Muscles Treated: bilat lumbar paraspinals Electrical Stimulation Performed: Yes, Parameters: 2 CPS milliAmps, intensity until muscle twitch x 8 min Treatment Response/Outcome: Decreased muscle tension STM & TPR bilat thoracic paraspinals, periscapular muscles Grade II to III thoracic PAs and UPAs Standing counter lumbar ext x10 Standing shoulder ext green TB reactive iso 2x10 Standing row green TB reactive iso 2x10   05/10/23 Eliptical L1.0 x 6 min Supine LTR 5x10" both ways Open books x 10 bil NEUROMUSCULAR RE-EDUCATION: To improve proprioception, balance, and kinesthesia. TRX lat pulldown 2 x 10 TRX reverse fly 2x10 Ladder walk ups to top x 10 Ladder push ups x 10  Standing shoulder ext RTB - inc strain on R triceps  05/07/23 Seated  Pball flexion x5; lateral flexion x5 R&L  Piriformis  stretch x 30"  Figure 4 stretch x 30" Standing doorway pec stretch low and at 90 deg of abd 2x30" each Trigger Point Dry Needling  Subsequent Treatment: Instructions provided previously at initial dry needling treatment.   Patient Verbal Consent Given: Yes Education Handout Provided: Previously Provided Muscles Treated: bilat lumbar paraspinals Electrical Stimulation Performed: Yes, Parameters: 2 CPS milliAmps, intensity until muscle twitch x 8 min  Treatment Response/Outcome: Twitch response, decreased muscle tension  Modified quadruped cat/cow x10 Modified quadruped "wag the tail"  Sitting on pball ant/post pelvic tilt x10  05/03/23 Therapeutic Exercise: to improve strength, ROM, flexibility, and endurance  Eliptical L1.0 x 6 min Supine on pool noodle: - chest stretch 2x30" - backstroke 10x5" bil - horizontal ABD 10x5"  NEUROMUSCULAR RE-EDUCATION: To improve proprioception, balance, and kinesthesia. TRX lat pulldown 2 x 10 TRX reverse fly 2 x 10 Kettle bell swing 10lb 2x10- cues to keep weight on ball of feet Trunk rotation blue TB 2x10 bil Pallof press blue TB 2x10 bil Standing shoulder extension 15lb 2x10   04/24/23 EVAL Self Care: Findings, POC, discussion of chiropractic, recommendations to continue to work on as much mobility as possible, review of imaging and findings, importance of regaining as much mobility in hips/hamstrings/quads to decrease pull in low back Therapeutic Exercise: to improve strength and mobility.  Demo, verbal and tactile cues throughout for technique.  Supine Hip Internal and External Rotation  -  10 reps - Bent Knee Fallouts  - 10 reps - Supine Posterior Pelvic Tilt with Knee Rocks  - 10 reps - Supine Posterior Pelvic Tilt  - 10 reps - Hooklying Hamstring Stretch with Strap  - review  - Prone Quadriceps Stretch with Strap  - review   PATIENT EDUCATION:  Education details: HEP, findings, POC Person educated: Patient Education method:  Explanation, Demonstration, Verbal cues, and Handouts Education comprehension: verbalized understanding and returned demonstration  HOME EXERCISE PROGRAM: Access Code: [email or web address] URL: [email or web address] Date: 05/14/2023 Prepared by: Vernon Prey April Kirstie Peri  Exercises - Bent Knee Fallouts  - 1 x daily - 7 x weekly -  2 sets - 10 reps - Supine Posterior Pelvic Tilt with Knee Rocks  - 1 x daily - 7 x weekly - 1 sets - 10 reps - Supine Posterior Pelvic Tilt  - 1 x daily - 7 x weekly - 1 sets - 10 reps - Hooklying Hamstring Stretch with Strap  - 1 x daily - 7 x weekly - 1 sets - 3 reps - 30 sec hold - Prone Quadriceps Stretch with Strap  - 1 x daily - 7 x weekly - 1 sets - 3 reps - 30 sec hold - Seated Figure 4 Piriformis Stretch  - 1 x daily - 7 x weekly - 2 sets - 30 sec hold - Seated Piriformis Stretch  - 1 x daily - 7 x weekly - 2 sets - 30 sec hold - Seated Thoracic Lumbar Extension with Pectoralis Stretch  - 1 x daily - 7 x weekly - 1 sets - 10 reps - Modified Cat Cow at Chair  - 1 x daily - 7 x weekly - 1 sets - 10 reps - Shoulder Extension Reactive Isometrics with Elbow Extended  - 1 x daily - 7 x weekly - 2 sets - 10 reps - Standing Shoulder Row Reactive Isometric  - 1 x daily - 7 x weekly - 2 sets - 10 reps  ASSESSMENT:  CLINICAL IMPRESSION: Pt tolerated the progression of exercises well. Advanced NMR for postural alignment and core engagement. Cues provided as needed with interventions to target the muscles and prevent compensations. He is noting 60% improvement in overall LBP, progressing very well.   OBJECTIVE IMPAIRMENTS: decreased activity tolerance, decreased mobility, decreased ROM, decreased strength, increased fascial restrictions, impaired perceived functional ability, increased muscle spasms, impaired flexibility, postural dysfunction, and pain.   ACTIVITY LIMITATIONS: lifting, bending, sitting, standing, and squatting  PARTICIPATION LIMITATIONS: meal  prep, cleaning, laundry, shopping, and occupation  PERSONAL FACTORS: Past/current experiences, Time since onset of injury/illness/exacerbation, and 1-2 comorbidities: Hx R meniscal tear (2020), hx laminectomy L4-5, DM, R achilles tendonitis, chronic back pain  are also affecting patient's functional outcome.   REHAB POTENTIAL: Good  CLINICAL DECISION MAKING: Evolving/moderate complexity  EVALUATION COMPLEXITY: Moderate   GOALS: Goals reviewed with patient? Yes  SHORT TERM GOALS: Target date: 05/08/2023   Patient will be independent with initial HEP.  Baseline:  Goal status: INITIAL   LONG TERM GOALS: Target date: 06/19/2023   Patient will be independent with advanced/ongoing HEP to improve outcomes and carryover.  Baseline:  Goal status: INITIAL  2.  Patient will report 75% improvement in low back pain/stiffness to improve QOL.  Baseline:  Goal status: IN PROGRESS- 05/17/23- 60% improvement  3.  Patient will demonstrate improved hamstring extensibility by 25 deg bil to decrease pull on low back.  Baseline: measured through popliteal angle Right lacking 70 deg; Left lacking 70 deg  Goal status: INITIAL  4.  Patient will demonstrate improved quad extensibility as being able to bend knees in prone by 15 deg bil.    Baseline: Right 60 deg; Left 70 deg  Goal status: INITIAL  PLAN:  PT FREQUENCY: 1-2x/week  PT DURATION: 8 weeks  PLANNED INTERVENTIONS: 97110-Therapeutic exercises, 97530- Therapeutic activity, 97112- Neuromuscular re-education, 97535- Self Care, 16109- Manual therapy, Taping, Dry Needling, Joint mobilization, Joint manipulation, Spinal manipulation, Spinal mobilization, Cryotherapy, and Moist heat.  PLAN FOR NEXT SESSION: progress/review stretches and lumbar/hip mobility as tolerated. Work on trunk extension/posterior shoulder/midback strengthening and core to decrease trunk flexion in standing   Beryle Zeitz L  Norah Devin, PTA 05/17/2023, 1:58 PM

## 2023-05-21 ENCOUNTER — Ambulatory Visit: Admitting: Physical Therapy

## 2023-05-21 DIAGNOSIS — M6281 Muscle weakness (generalized): Secondary | ICD-10-CM

## 2023-05-21 DIAGNOSIS — M5416 Radiculopathy, lumbar region: Secondary | ICD-10-CM | POA: Diagnosis not present

## 2023-05-21 DIAGNOSIS — M5459 Other low back pain: Secondary | ICD-10-CM | POA: Diagnosis not present

## 2023-05-21 DIAGNOSIS — R252 Cramp and spasm: Secondary | ICD-10-CM

## 2023-05-21 DIAGNOSIS — M25561 Pain in right knee: Secondary | ICD-10-CM | POA: Diagnosis not present

## 2023-05-21 DIAGNOSIS — R293 Abnormal posture: Secondary | ICD-10-CM

## 2023-05-21 DIAGNOSIS — R2689 Other abnormalities of gait and mobility: Secondary | ICD-10-CM

## 2023-05-21 NOTE — Therapy (Signed)
 OUTPATIENT PHYSICAL THERAPY THORACOLUMBAR TREATMENT   Patient Name: Anthony Russell MRN: [id number] DOB:20-Apr-1980, 43 y.o., male Today's Date: 05/21/2023  END OF SESSION:  PT End of Session - 05/21/23 1155     Visit Number 7    Date for PT Re-Evaluation 06/19/23    Authorization Type [employer]    PT Start Time 1152    PT Stop Time 1230    PT Time Calculation (min) 38 min    Activity Tolerance Patient tolerated treatment well    Behavior During Therapy WFL for tasks assessed/performed              Past Medical History:  Diagnosis Date   Allergy    Allergy, unspecified not elsewhere classified    rx w/ OTC antihistamines PRN   Anxiety    Asthma    Atypical chest pain    recent neg eval w/ normal cxr/ekg   Depression    Diverticulosis    DM (diabetes mellitus) (HCC)    Dyspepsia    on protonix  40mg /d   Esophagitis    GERD (gastroesophageal reflux disease)    on protonix  40mg /d   HTN (hypertension)    Hyperlipidemia    on diet alone   IBS (irritable bowel syndrome)    Lumbar back pain    s/p lumbar laminectomy 2007 by DrCabbell   Migraines    Overweight(278.02)    weight Jan10=246#.[name] was 225# in 9/07...diet and exercise was discussed   Sleep apnea    not wearing c-pap currently   Past Surgical History:  Procedure Laterality Date   DENTAL SURGERY     LUMBAR LAMINECTOMY  01/30/2005   DrCabbell   TEAR DUCT PROBING     Patient Active Problem List   Diagnosis Date Noted   Lumbar radiculopathy 05/10/2022   Capsulitis of left shoulder 05/10/2022   Sinus tachycardia 12/28/2021   Impingement of knee joint, right 12/20/2021   Achilles tendinitis, right leg 12/20/2021   Rotator cuff tendinitis, right 11/04/2021   Encounter for screening for other metabolic disorders 04/07/2021   Dyspnea on exertion 04/07/2021   Migraines 02/23/2021   Lumbar back pain 02/23/2021   HTN (hypertension) 02/23/2021   Dyspepsia 02/23/2021   DM (diabetes mellitus) (HCC) 02/23/2021    Nasal septal deviation 02/17/2021   Laryngitis 01/19/2021   Asymmetric tonsils 12/01/2020   Subacromial bursitis of right shoulder joint 05/17/2020   Hypersomnia 11/03/2015   Obesity 11/03/2015   Allergic rhinitis 04/15/2014   Asthma in adult 04/15/2014   GERD (gastroesophageal reflux disease) 04/15/2014   IBS (irritable bowel syndrome) 04/01/2013   Nausea alone 01/15/2013   Diarrhea 01/15/2013   Depression 10/15/2012   Lesion of eyebrow 02/09/2012   Gastroparesis 07/05/2011   Anxiety 07/05/2010   TESTOSTERONE  DEFICIENCY 05/26/2008   Hyperlipidemia 02/06/2008   Facet arthropathy, lumbar 02/06/2008   Atypical chest pain 02/06/2008    PCP: Sylvia Everts, PA-C   REFERRING PROVIDER: Frazier Jacob, DO   REFERRING DIAG: [phone] (ICD-10-CM) - Chronic bilateral low back pain without sciatica   Rationale for Evaluation and Treatment: Rehabilitation  THERAPY DIAG:  Other low back pain  Abnormal posture  Cramp and spasm  Muscle weakness (generalized)  Other abnormalities of gait and mobility  ONSET DATE: chronic worsening over last year   SUBJECTIVE:  SUBJECTIVE STATEMENT: Pt reports things are feeling okay today. No pain.   PERTINENT HISTORY:  Hx R meniscal tear (2020), hx laminectomy L4-5, DM, R achilles tendonitis, chronic back pain  PAIN:  Are you having pain? Yes: NPRS scale: 0/10 Pain location: low back  Pain description: stiffness, throbbing  Aggravating factors: moving especially after sitting prolong periods Relieving factors: laying down, changing positions  PRECAUTIONS: None  RED FLAGS: None   WEIGHT BEARING RESTRICTIONS: No  FALLS:  Has patient fallen in last 6 months? No  LIVING ENVIRONMENT: Lives with: lives with their spouse Lives in:  House/apartment Stairs: Yes: External: 2 flights steps; on right going up, on left going up, and can reach both Has following equipment at home: None  OCCUPATION: Sport and exercise psychologist, works at home; has sit down and stand up desk   PLOF: Independent and Leisure: boxing   PATIENT GOALS: move easier with better ROM and not hurt as much  NEXT MD VISIT: 08/31/2023 with Rheumatology  OBJECTIVE:   DIAGNOSTIC FINDINGS:  03/21/23 DG Lumbar spine FINDINGS: Substantial lumbar spondylosis with posterior element fusion at L4-L5-S1 and extensive facet spurring at all levels between L2 and S1. Moderate loss of intervertebral disc height at L4-5 and L5-S1.   No subluxation or appreciable fracture.   IMPRESSION: 1. Substantial lumbar spondylosis and degenerative disc disease. 2. Posterior element fusion at L4-L5-S1.  PATIENT SURVEYS:  Modified Oswestry 6/50   COGNITION: Overall cognitive status: Within functional limits for tasks assessed     SENSATION: WFL  MUSCLE LENGTH: Hamstrings: Right 70 deg; Left 70 deg - significant tightness bil Quadriceps in prone: Right 60 deg; Left 70 deg - significant tightness bil   POSTURE: decreased lumbar lordosis and increased thoracic kyphosis  PALPATION: Decreased mobility throughout lumbar spine with PA mobs,   LUMBAR ROM:   AROM eval  Flexion No lumbar flexion, compensates with hip and knee flexion  Extension Limited 90%, compensating with hip extension  Right lateral flexion Limited 90%, compensating hip flexion  Left lateral flexion Limited 90%, compensating hip flexion  Right rotation Limited 90%, compensating hip rotation  Left rotation Limited 90%, compensating hip rotation   (Blank rows = not tested)  LOWER EXTREMITY ROM:     L hip internal rotation 0, R hip internal rotation 16  LOWER EXTREMITY MMT:    MMT Right eval Left eval  Hip flexion 5 5  Hip extension 4 4  Hip abduction 5 5  Hip adduction 5 5  Knee flexion 5 5  Knee  extension 5 5  Ankle dorsiflexion Cam boot   Ankle plantarflexion Cam boot    (Blank rows = not tested)  GAIT: Distance walked: 50' Assistive device utilized: None Level of assistance: Complete Independence Comments: no deviation   TODAY'S TREATMENT:  DATE: 05/21/23 UBE L2; 3 min fwd, 3 min bwd Thoracic ext in chair x10 Thoracic MWM UPAs and CPAs in sitting x10 Lat pull 25# 2x10 Standing shoulder ext 5# x10, 10# x10 Seated horizontal shoulder abd 10# 2x10 Standing against wall diagonals red TB 2x10  Standing against wall chops red TB 2x10 Wall slide maintaining trunk ext 2x10 Low trap setting against wall 2x10    05/17/23 Eliptical L3.0 x 6 min Lat pulls 25lb 2x10 Trunk rotations 5lb x 10 bil Kneeling chest supported on green pball: -I, T, Y, W 2x10 each Open books x 10 bil HS stretch with strap supine 2x30" Prone Quad stretch 2x30" bil  05/14/23 Doorway pec stretch low, mid, high x30" each Thoracic ext in chair x10 Seated lateral flexion x2 slowly one vertebrae at home Seated thoracic rotation x3 Seated thread the needle x3 Seated scap squeeze 2x10 Trigger Point Dry Needling  Subsequent Treatment: Instructions provided previously at initial dry needling treatment.   Patient Verbal Consent Given: Yes Education Handout Provided: Previously Provided Muscles Treated: bilat lumbar paraspinals Electrical Stimulation Performed: Yes, Parameters: 2 CPS milliAmps, intensity until muscle twitch x 8 min Treatment Response/Outcome: Decreased muscle tension STM & TPR bilat thoracic paraspinals, periscapular muscles Grade II to III thoracic PAs and UPAs Standing counter lumbar ext x10 Standing shoulder ext green TB reactive iso 2x10 Standing row green TB reactive iso 2x10   05/10/23 Eliptical L1.0 x 6 min Supine LTR 5x10" both ways Open books x 10  bil NEUROMUSCULAR RE-EDUCATION: To improve proprioception, balance, and kinesthesia. TRX lat pulldown 2 x 10 TRX reverse fly 2x10 Ladder walk ups to top x 10 Ladder push ups x 10  Standing shoulder ext RTB - inc strain on R triceps  05/07/23 Seated  Pball flexion x5; lateral flexion x5 R&L  Piriformis stretch x 30"  Figure 4 stretch x 30" Standing doorway pec stretch low and at 90 deg of abd 2x30" each Trigger Point Dry Needling  Subsequent Treatment: Instructions provided previously at initial dry needling treatment.   Patient Verbal Consent Given: Yes Education Handout Provided: Previously Provided Muscles Treated: bilat lumbar paraspinals Electrical Stimulation Performed: Yes, Parameters: 2 CPS milliAmps, intensity until muscle twitch x 8 min  Treatment Response/Outcome: Twitch response, decreased muscle tension  Modified quadruped cat/cow x10 Modified quadruped "wag the tail"  Sitting on pball ant/post pelvic tilt x10  05/03/23 Therapeutic Exercise: to improve strength, ROM, flexibility, and endurance  Eliptical L1.0 x 6 min Supine on pool noodle: - chest stretch 2x30" - backstroke 10x5" bil - horizontal ABD 10x5"  NEUROMUSCULAR RE-EDUCATION: To improve proprioception, balance, and kinesthesia. TRX lat pulldown 2 x 10 TRX reverse fly 2 x 10 Kettle bell swing 10lb 2x10- cues to keep weight on ball of feet Trunk rotation blue TB 2x10 bil Pallof press blue TB 2x10 bil Standing shoulder extension 15lb 2x10   04/24/23 EVAL Self Care: Findings, POC, discussion of chiropractic, recommendations to continue to work on as much mobility as possible, review of imaging and findings, importance of regaining as much mobility in hips/hamstrings/quads to decrease pull in low back Therapeutic Exercise: to improve strength and mobility.  Demo, verbal and tactile cues throughout for technique.  Supine Hip Internal and External Rotation  -  10 reps - Bent Knee Fallouts  - 10 reps - Supine  Posterior Pelvic Tilt with Knee Rocks  - 10 reps - Supine Posterior Pelvic Tilt  - 10 reps - Hooklying Hamstring Stretch with Strap  - review  -  Prone Quadriceps Stretch with Strap  - review   PATIENT EDUCATION:  Education details: HEP, findings, POC Person educated: Patient Education method: Explanation, Demonstration, Verbal cues, and Handouts Education comprehension: verbalized understanding and returned demonstration  HOME EXERCISE PROGRAM: Access Code: [email or web address] URL: [email or web address] Date: 05/21/2023 Prepared by: Tyesha Joffe April Erman [place]  Exercises - Hooklying Hamstring Stretch with Strap  - 1 x daily - 7 x weekly - 1 sets - 3 reps - 30 sec hold - Prone Quadriceps Stretch with Strap  - 1 x daily - 7 x weekly - 1 sets - 3 reps - 30 sec hold - Seated Piriformis Stretch  - 1 x daily - 7 x weekly - 2 sets - 30 sec hold - Seated Thoracic Lumbar Extension with Pectoralis Stretch  - 1 x daily - 7 x weekly - 1 sets - 10 reps - Modified Cat Cow at Chair  - 1 x daily - 7 x weekly - 1 sets - 10 reps - Shoulder Extension Reactive Isometrics with Elbow Extended  - 1 x daily - 7 x weekly - 2 sets - 10 reps - Standing Shoulder Row Reactive Isometric  - 1 x daily - 7 x weekly - 2 sets - 10 reps - Low Trap Setting at Wall  - 1 x daily - 7 x weekly - 2 sets - 10 reps - Wall Squat  - 1 x daily - 7 x weekly - 2 sets - 10 reps  ASSESSMENT:  CLINICAL IMPRESSION: Improving thoracolumbar mobility noted with mobs. Continued trunk extensor and core strengthening today with good pt tolerance.   OBJECTIVE IMPAIRMENTS: decreased activity tolerance, decreased mobility, decreased ROM, decreased strength, increased fascial restrictions, impaired perceived functional ability, increased muscle spasms, impaired flexibility, postural dysfunction, and pain.   ACTIVITY LIMITATIONS: lifting, bending, sitting, standing, and squatting  PARTICIPATION LIMITATIONS: meal prep, cleaning, laundry,  shopping, and occupation  PERSONAL FACTORS: Past/current experiences, Time since onset of injury/illness/exacerbation, and 1-2 comorbidities: Hx R meniscal tear (2020), hx laminectomy L4-5, DM, R achilles tendonitis, chronic back pain  are also affecting patient's functional outcome.   REHAB POTENTIAL: Good  CLINICAL DECISION MAKING: Evolving/moderate complexity  EVALUATION COMPLEXITY: Moderate   GOALS: Goals reviewed with patient? Yes  SHORT TERM GOALS: Target date: 05/08/2023   Patient will be independent with initial HEP.  Baseline:  Goal status: INITIAL   LONG TERM GOALS: Target date: 06/19/2023   Patient will be independent with advanced/ongoing HEP to improve outcomes and carryover.  Baseline:  Goal status: INITIAL  2.  Patient will report 75% improvement in low back pain/stiffness to improve QOL.  Baseline:  Goal status: IN PROGRESS- 05/17/23- 60% improvement  3.  Patient will demonstrate improved hamstring extensibility by 25 deg bil to decrease pull on low back.  Baseline: measured through popliteal angle Right lacking 70 deg; Left lacking 70 deg  Goal status: INITIAL  4.  Patient will demonstrate improved quad extensibility as being able to bend knees in prone by 15 deg bil.    Baseline: Right 60 deg; Left 70 deg  Goal status: INITIAL  PLAN:  PT FREQUENCY: 1-2x/week  PT DURATION: 8 weeks  PLANNED INTERVENTIONS: 97110-Therapeutic exercises, 97530- Therapeutic activity, 97112- Neuromuscular re-education, 97535- Self Care, [id number]- Manual therapy, Taping, Dry Needling, Joint mobilization, Joint manipulation, Spinal manipulation, Spinal mobilization, Cryotherapy, and Moist heat.  PLAN FOR NEXT SESSION: progress/review stretches and lumbar/hip mobility as tolerated. Work on trunk extension/posterior shoulder/midback strengthening and core to decrease trunk flexion in standing  Naylani Bradner April Ma L Harvis Mabus, PT 05/21/2023, 11:55 AM

## 2023-05-30 ENCOUNTER — Ambulatory Visit: Admitting: Physical Therapy

## 2023-05-31 ENCOUNTER — Telehealth: Payer: Self-pay

## 2023-05-31 NOTE — Telephone Encounter (Signed)
 Patient called to check status of CFO's  Shipped on 05/24/23 set appt for fitting on 06/06/23 at 1:30p

## 2023-06-04 ENCOUNTER — Ambulatory Visit: Attending: Sports Medicine | Admitting: Physical Therapy

## 2023-06-04 DIAGNOSIS — M6281 Muscle weakness (generalized): Secondary | ICD-10-CM | POA: Insufficient documentation

## 2023-06-04 DIAGNOSIS — R2689 Other abnormalities of gait and mobility: Secondary | ICD-10-CM | POA: Insufficient documentation

## 2023-06-04 DIAGNOSIS — R293 Abnormal posture: Secondary | ICD-10-CM | POA: Insufficient documentation

## 2023-06-04 DIAGNOSIS — R252 Cramp and spasm: Secondary | ICD-10-CM | POA: Insufficient documentation

## 2023-06-04 DIAGNOSIS — M5459 Other low back pain: Secondary | ICD-10-CM | POA: Insufficient documentation

## 2023-06-04 NOTE — Therapy (Deleted)
 OUTPATIENT PHYSICAL THERAPY THORACOLUMBAR TREATMENT   Patient Name: Anthony Russell MRN: [id number] DOB:[birth year], 43 y.o., male Today's Date: 06/04/2023  END OF SESSION:     Past Medical History:  Diagnosis Date   Allergy    Allergy, unspecified not elsewhere classified    rx w/ OTC antihistamines PRN   Anxiety    Asthma    Atypical chest pain    recent neg eval w/ normal cxr/ekg   Depression    Diverticulosis    DM (diabetes mellitus) (HCC)    Dyspepsia    on protonix  40mg /d   Esophagitis    GERD (gastroesophageal reflux disease)    on protonix  40mg /d   HTN (hypertension)    Hyperlipidemia    on diet alone   IBS (irritable bowel syndrome)    Lumbar back pain    s/p lumbar laminectomy 2007 by DrCabbell   Migraines    Overweight(278.02)    weight Jan10=246#.[name] was 225# in 9/07...diet and exercise was discussed   Sleep apnea    not wearing c-pap currently   Past Surgical History:  Procedure Laterality Date   DENTAL SURGERY     LUMBAR LAMINECTOMY  01/30/2005   DrCabbell   TEAR DUCT PROBING     Patient Active Problem List   Diagnosis Date Noted   Lumbar radiculopathy 05/10/2022   Capsulitis of left shoulder 05/10/2022   Sinus tachycardia 12/28/2021   Impingement of knee joint, right 12/20/2021   Achilles tendinitis, right leg 12/20/2021   Rotator cuff tendinitis, right 11/04/2021   Encounter for screening for other metabolic disorders 04/07/2021   Dyspnea on exertion 04/07/2021   Migraines 02/23/2021   Lumbar back pain 02/23/2021   HTN (hypertension) 02/23/2021   Dyspepsia 02/23/2021   DM (diabetes mellitus) (HCC) 02/23/2021   Nasal septal deviation 02/17/2021   Laryngitis 01/19/2021   Asymmetric tonsils 12/01/2020   Subacromial bursitis of right shoulder joint 05/17/2020   Hypersomnia 11/03/2015   Obesity 11/03/2015   Allergic rhinitis 04/15/2014   Asthma in adult 04/15/2014   GERD (gastroesophageal reflux disease) 04/15/2014   IBS (irritable  bowel syndrome) 04/01/2013   Nausea alone 01/15/2013   Diarrhea 01/15/2013   Depression 10/15/2012   Lesion of eyebrow 02/09/2012   Gastroparesis 07/05/2011   Anxiety 07/05/2010   TESTOSTERONE  DEFICIENCY 05/26/2008   Hyperlipidemia 02/06/2008   Facet arthropathy, lumbar 02/06/2008   Atypical chest pain 02/06/2008    PCP: Sylvia Everts, PA-C   REFERRING PROVIDER: Frazier Jacob, DO   REFERRING DIAG: [id number] (ICD-10-CM) - Chronic bilateral low back pain without sciatica   Rationale for Evaluation and Treatment: Rehabilitation  THERAPY DIAG:  No diagnosis found.  ONSET DATE: chronic worsening over last year   SUBJECTIVE:  SUBJECTIVE STATEMENT: *** Pt reports things are feeling okay today. No pain.   PERTINENT HISTORY:  Hx R meniscal tear (2020), hx laminectomy L4-5, DM, R achilles tendonitis, chronic back pain  PAIN:  Are you having pain? Yes: NPRS scale: 0/10 Pain location: low back  Pain description: stiffness, throbbing  Aggravating factors: moving especially after sitting prolong periods Relieving factors: laying down, changing positions  PRECAUTIONS: None  RED FLAGS: None   WEIGHT BEARING RESTRICTIONS: No  FALLS:  Has patient fallen in last 6 months? No  LIVING ENVIRONMENT: Lives with: lives with their spouse Lives in: House/apartment Stairs: Yes: External: 2 flights steps; on right going up, on left going up, and can reach both Has following equipment at home: None  OCCUPATION: Sport and exercise psychologist, works at home; has sit down and stand up desk   PLOF: Independent and Leisure: boxing   PATIENT GOALS: move easier with better ROM and not hurt as much  NEXT MD VISIT: 08/31/2023 with Rheumatology  OBJECTIVE:   DIAGNOSTIC FINDINGS:  03/21/23 DG Lumbar spine  FINDINGS: Substantial lumbar spondylosis with posterior element fusion at L4-L5-S1 and extensive facet spurring at all levels between L2 and S1. Moderate loss of intervertebral disc height at L4-5 and L5-S1.   No subluxation or appreciable fracture.   IMPRESSION: 1. Substantial lumbar spondylosis and degenerative disc disease. 2. Posterior element fusion at L4-L5-S1.  PATIENT SURVEYS:  Modified Oswestry 6/50   COGNITION: Overall cognitive status: Within functional limits for tasks assessed     SENSATION: WFL  MUSCLE LENGTH: Hamstrings: Right 70 deg; Left 70 deg - significant tightness bil Quadriceps in prone: Right 60 deg; Left 70 deg - significant tightness bil   POSTURE: decreased lumbar lordosis and increased thoracic kyphosis  PALPATION: Decreased mobility throughout lumbar spine with PA mobs,   LUMBAR ROM:   AROM eval  Flexion No lumbar flexion, compensates with hip and knee flexion  Extension Limited 90%, compensating with hip extension  Right lateral flexion Limited 90%, compensating hip flexion  Left lateral flexion Limited 90%, compensating hip flexion  Right rotation Limited 90%, compensating hip rotation  Left rotation Limited 90%, compensating hip rotation   (Blank rows = not tested)  LOWER EXTREMITY ROM:     L hip internal rotation 0, R hip internal rotation 16  LOWER EXTREMITY MMT:    MMT Right eval Left eval  Hip flexion 5 5  Hip extension 4 4  Hip abduction 5 5  Hip adduction 5 5  Knee flexion 5 5  Knee extension 5 5  Ankle dorsiflexion Cam boot   Ankle plantarflexion Cam boot    (Blank rows = not tested)  GAIT: Distance walked: 50' Assistive device utilized: None Level of assistance: Complete Independence Comments: no deviation   TODAY'S TREATMENT:  DATE: 06/04/23 ***  05/21/23 UBE L2; 3 min fwd, 3 min  bwd Thoracic ext in chair x10 Thoracic MWM UPAs and CPAs in sitting x10 Lat pull 25# 2x10 Standing shoulder ext 5# x10, 10# x10 Seated horizontal shoulder abd 10# 2x10 Standing against wall diagonals red TB 2x10  Standing against wall chops red TB 2x10 Wall slide maintaining trunk ext 2x10 Low trap setting against wall 2x10    05/17/23 Eliptical L3.0 x 6 min Lat pulls 25lb 2x10 Trunk rotations 5lb x 10 bil Kneeling chest supported on green pball: -I, T, Y, W 2x10 each Open books x 10 bil HS stretch with strap supine 2x30" Prone Quad stretch 2x30" bil  05/14/23 Doorway pec stretch low, mid, high x30" each Thoracic ext in chair x10 Seated lateral flexion x2 slowly one vertebrae at home Seated thoracic rotation x3 Seated thread the needle x3 Seated scap squeeze 2x10 Trigger Point Dry Needling  Subsequent Treatment: Instructions provided previously at initial dry needling treatment.   Patient Verbal Consent Given: Yes Education Handout Provided: Previously Provided Muscles Treated: bilat lumbar paraspinals Electrical Stimulation Performed: Yes, Parameters: 2 CPS milliAmps, intensity until muscle twitch x 8 min Treatment Response/Outcome: Decreased muscle tension STM & TPR bilat thoracic paraspinals, periscapular muscles Grade II to III thoracic PAs and UPAs Standing counter lumbar ext x10 Standing shoulder ext green TB reactive iso 2x10 Standing row green TB reactive iso 2x10   05/10/23 Eliptical L1.0 x 6 min Supine LTR 5x10" both ways Open books x 10 bil NEUROMUSCULAR RE-EDUCATION: To improve proprioception, balance, and kinesthesia. TRX lat pulldown 2 x 10 TRX reverse fly 2x10 Ladder walk ups to top x 10 Ladder push ups x 10  Standing shoulder ext RTB - inc strain on R triceps  05/07/23 Seated  Pball flexion x5; lateral flexion x5 R&L  Piriformis stretch x 30"  Figure 4 stretch x 30" Standing doorway pec stretch low and at 90 deg of abd 2x30" each Trigger Point  Dry Needling  Subsequent Treatment: Instructions provided previously at initial dry needling treatment.   Patient Verbal Consent Given: Yes Education Handout Provided: Previously Provided Muscles Treated: bilat lumbar paraspinals Electrical Stimulation Performed: Yes, Parameters: 2 CPS milliAmps, intensity until muscle twitch x 8 min  Treatment Response/Outcome: Twitch response, decreased muscle tension  Modified quadruped cat/cow x10 Modified quadruped "wag the tail"  Sitting on pball ant/post pelvic tilt x10    PATIENT EDUCATION:  Education details: HEP, findings, POC Person educated: Patient Education method: Explanation, Demonstration, Verbal cues, and Handouts Education comprehension: verbalized understanding and returned demonstration  HOME EXERCISE PROGRAM: Access Code: [email or web address] URL: [email or web address] Date: 05/21/2023 Prepared by: Holland Nickson April Erman Hayward  Exercises - Hooklying Hamstring Stretch with Strap  - 1 x daily - 7 x weekly - 1 sets - 3 reps - 30 sec hold - Prone Quadriceps Stretch with Strap  - 1 x daily - 7 x weekly - 1 sets - 3 reps - 30 sec hold - Seated Piriformis Stretch  - 1 x daily - 7 x weekly - 2 sets - 30 sec hold - Seated Thoracic Lumbar Extension with Pectoralis Stretch  - 1 x daily - 7 x weekly - 1 sets - 10 reps - Modified Cat Cow at Chair  - 1 x daily - 7 x weekly - 1 sets - 10 reps - Shoulder Extension Reactive Isometrics with Elbow Extended  - 1 x daily - 7 x weekly - 2 sets - 10 reps - Standing Shoulder Row  Reactive Isometric  - 1 x daily - 7 x weekly - 2 sets - 10 reps - Low Trap Setting at Wall  - 1 x daily - 7 x weekly - 2 sets - 10 reps - Wall Squat  - 1 x daily - 7 x weekly - 2 sets - 10 reps  ASSESSMENT:  CLINICAL IMPRESSION: *** Improving thoracolumbar mobility noted with mobs. Continued trunk extensor and core strengthening today with good pt tolerance.   OBJECTIVE IMPAIRMENTS: decreased activity tolerance,  decreased mobility, decreased ROM, decreased strength, increased fascial restrictions, impaired perceived functional ability, increased muscle spasms, impaired flexibility, postural dysfunction, and pain.   ACTIVITY LIMITATIONS: lifting, bending, sitting, standing, and squatting  PARTICIPATION LIMITATIONS: meal prep, cleaning, laundry, shopping, and occupation  PERSONAL FACTORS: Past/current experiences, Time since onset of injury/illness/exacerbation, and 1-2 comorbidities: Hx R meniscal tear (2020), hx laminectomy L4-5, DM, R achilles tendonitis, chronic back pain  are also affecting patient's functional outcome.   REHAB POTENTIAL: Good  CLINICAL DECISION MAKING: Evolving/moderate complexity  EVALUATION COMPLEXITY: Moderate   GOALS: Goals reviewed with patient? Yes  SHORT TERM GOALS: Target date: 05/08/2023   Patient will be independent with initial HEP.  Baseline:  Goal status: INITIAL   LONG TERM GOALS: Target date: 06/19/2023   Patient will be independent with advanced/ongoing HEP to improve outcomes and carryover.  Baseline:  Goal status: INITIAL  2.  Patient will report 75% improvement in low back pain/stiffness to improve QOL.  Baseline:  Goal status: IN PROGRESS- 05/17/23- 60% improvement  3.  Patient will demonstrate improved hamstring extensibility by 25 deg bil to decrease pull on low back.  Baseline: measured through popliteal angle Right lacking 70 deg; Left lacking 70 deg  Goal status: INITIAL  4.  Patient will demonstrate improved quad extensibility as being able to bend knees in prone by 15 deg bil.    Baseline: Right 60 deg; Left 70 deg  Goal status: INITIAL  PLAN:  PT FREQUENCY: 1-2x/week  PT DURATION: 8 weeks  PLANNED INTERVENTIONS: 97110-Therapeutic exercises, 97530- Therapeutic activity, 97112- Neuromuscular re-education, 97535- Self Care, 16109- Manual therapy, Taping, Dry Needling, Joint mobilization, Joint manipulation, Spinal manipulation, Spinal  mobilization, Cryotherapy, and Moist heat.  PLAN FOR NEXT SESSION: progress/review stretches and lumbar/hip mobility as tolerated. Work on trunk extension/posterior shoulder/midback strengthening and core to decrease trunk flexion in standing   Finola Rosal April Ma L Ottis Vacha, PT, DPT 06/04/2023, 8:07 AM

## 2023-06-06 ENCOUNTER — Ambulatory Visit

## 2023-06-06 NOTE — Progress Notes (Signed)
 Patient presents today to pick up custom molded foot orthotics, diagnosed with Achilles tendonitis by Dr. Lydia Sams.   Orthotics were dispensed and fit was satisfactory. Reviewed instructions for break-in and wear. Written instructions given to patient.  Patient will follow up as needed.   Britton Cane Cped, CFo, CFm

## 2023-06-07 ENCOUNTER — Ambulatory Visit

## 2023-06-07 DIAGNOSIS — M5459 Other low back pain: Secondary | ICD-10-CM | POA: Diagnosis not present

## 2023-06-07 DIAGNOSIS — M6281 Muscle weakness (generalized): Secondary | ICD-10-CM | POA: Diagnosis not present

## 2023-06-07 DIAGNOSIS — R293 Abnormal posture: Secondary | ICD-10-CM

## 2023-06-07 DIAGNOSIS — R252 Cramp and spasm: Secondary | ICD-10-CM

## 2023-06-07 DIAGNOSIS — R2689 Other abnormalities of gait and mobility: Secondary | ICD-10-CM | POA: Diagnosis not present

## 2023-06-07 NOTE — Therapy (Signed)
 OUTPATIENT PHYSICAL THERAPY THORACOLUMBAR TREATMENT   Patient Name: Anthony Russell MRN: [id number] DOB:26-May-1980, 43 y.o., male Today's Date: 06/07/2023  END OF SESSION:  PT End of Session - 06/07/23 1400     Visit Number 8    Date for PT Re-Evaluation 06/19/23    Authorization Type [employer]    PT Start Time 1317    PT Stop Time 1358    PT Time Calculation (min) 41 min    Activity Tolerance Patient tolerated treatment well    Behavior During Therapy WFL for tasks assessed/performed               Past Medical History:  Diagnosis Date   Allergy    Allergy, unspecified not elsewhere classified    rx w/ OTC antihistamines PRN   Anxiety    Asthma    Atypical chest pain    recent neg eval w/ normal cxr/ekg   Depression    Diverticulosis    DM (diabetes mellitus) (HCC)    Dyspepsia    on protonix  40mg /d   Esophagitis    GERD (gastroesophageal reflux disease)    on protonix  40mg /d   HTN (hypertension)    Hyperlipidemia    on diet alone   IBS (irritable bowel syndrome)    Lumbar back pain    s/p lumbar laminectomy 2007 by DrCabbell   Migraines    Overweight(278.02)    weight Jan10=246#.[name] was 225# in 9/07...diet and exercise was discussed   Sleep apnea    not wearing c-pap currently   Past Surgical History:  Procedure Laterality Date   DENTAL SURGERY     LUMBAR LAMINECTOMY  01/30/2005   DrCabbell   TEAR DUCT PROBING     Patient Active Problem List   Diagnosis Date Noted   Lumbar radiculopathy 05/10/2022   Capsulitis of left shoulder 05/10/2022   Sinus tachycardia 12/28/2021   Impingement of knee joint, right 12/20/2021   Achilles tendinitis, right leg 12/20/2021   Rotator cuff tendinitis, right 11/04/2021   Encounter for screening for other metabolic disorders 04/07/2021   Dyspnea on exertion 04/07/2021   Migraines 02/23/2021   Lumbar back pain 02/23/2021   HTN (hypertension) 02/23/2021   Dyspepsia 02/23/2021   DM (diabetes mellitus) (HCC) 02/23/2021    Nasal septal deviation 02/17/2021   Laryngitis 01/19/2021   Asymmetric tonsils 12/01/2020   Subacromial bursitis of right shoulder joint 05/17/2020   Hypersomnia 11/03/2015   Obesity 11/03/2015   Allergic rhinitis 04/15/2014   Asthma in adult 04/15/2014   GERD (gastroesophageal reflux disease) 04/15/2014   IBS (irritable bowel syndrome) 04/01/2013   Nausea alone 01/15/2013   Diarrhea 01/15/2013   Depression 10/15/2012   Lesion of eyebrow 02/09/2012   Gastroparesis 07/05/2011   Anxiety 07/05/2010   TESTOSTERONE  DEFICIENCY 05/26/2008   Hyperlipidemia 02/06/2008   Facet arthropathy, lumbar 02/06/2008   Atypical chest pain 02/06/2008    PCP: Sylvia Everts, PA-C   REFERRING PROVIDER: Frazier Jacob, DO   REFERRING DIAG: [id number] (ICD-10-CM) - Chronic bilateral low back pain without sciatica   Rationale for Evaluation and Treatment: Rehabilitation  THERAPY DIAG:  Other low back pain  Abnormal posture  Cramp and spasm  Muscle weakness (generalized)  Other abnormalities of gait and mobility  ONSET DATE: chronic worsening over last year   SUBJECTIVE:  SUBJECTIVE STATEMENT: Pt reports not much pain lately, he does still report tightness  PERTINENT HISTORY:  Hx R meniscal tear (2020), hx laminectomy L4-5, DM, R achilles tendonitis, chronic back pain  PAIN:  Are you having pain? Yes: NPRS scale: 0/10 Pain location: low back  Pain description: stiffness, throbbing  Aggravating factors: moving especially after sitting prolong periods Relieving factors: laying down, changing positions  PRECAUTIONS: None  RED FLAGS: None   WEIGHT BEARING RESTRICTIONS: No  FALLS:  Has patient fallen in last 6 months? No  LIVING ENVIRONMENT: Lives with: lives with their spouse Lives in:  House/apartment Stairs: Yes: External: 2 flights steps; on right going up, on left going up, and can reach both Has following equipment at home: None  OCCUPATION: Sport and exercise psychologist, works at home; has sit down and stand up desk   PLOF: Independent and Leisure: boxing   PATIENT GOALS: move easier with better ROM and not hurt as much  NEXT MD VISIT: 08/31/2023 with Rheumatology  OBJECTIVE:   DIAGNOSTIC FINDINGS:  03/21/23 DG Lumbar spine FINDINGS: Substantial lumbar spondylosis with posterior element fusion at L4-L5-S1 and extensive facet spurring at all levels between L2 and S1. Moderate loss of intervertebral disc height at L4-5 and L5-S1.   No subluxation or appreciable fracture.   IMPRESSION: 1. Substantial lumbar spondylosis and degenerative disc disease. 2. Posterior element fusion at L4-L5-S1.  PATIENT SURVEYS:  Modified Oswestry 6/50   COGNITION: Overall cognitive status: Within functional limits for tasks assessed     SENSATION: WFL  MUSCLE LENGTH: Hamstrings: Right 70 deg; Left 70 deg - significant tightness bil Quadriceps in prone: Right 60 deg; Left 70 deg - significant tightness bil   POSTURE: decreased lumbar lordosis and increased thoracic kyphosis  PALPATION: Decreased mobility throughout lumbar spine with PA mobs,   LUMBAR ROM:   AROM eval  Flexion No lumbar flexion, compensates with hip and knee flexion  Extension Limited 90%, compensating with hip extension  Right lateral flexion Limited 90%, compensating hip flexion  Left lateral flexion Limited 90%, compensating hip flexion  Right rotation Limited 90%, compensating hip rotation  Left rotation Limited 90%, compensating hip rotation   (Blank rows = not tested)  LOWER EXTREMITY ROM:     L hip internal rotation 0, R hip internal rotation 16  LOWER EXTREMITY MMT:    MMT Right eval Left eval  Hip flexion 5 5  Hip extension 4 4  Hip abduction 5 5  Hip adduction 5 5  Knee flexion 5 5  Knee  extension 5 5  Ankle dorsiflexion Cam boot   Ankle plantarflexion Cam boot    (Blank rows = not tested)  GAIT: Distance walked: 50' Assistive device utilized: None Level of assistance: Complete Independence Comments: no deviation   TODAY'S TREATMENT:  DATE: 06/07/23 Elliptical L2.0 x Quad flexibility: R- 108 , L-112 TRX power pull x 10 Bilat TRX torso rotation x 10 Bilat TRX good morning x 10 Bilat TRX pullovers x 10 Praying mantis orange pball x 10  Deadlift 5lb weight bil x 10 Passive trunk rotation stretch and HS stretch  05/21/23 UBE L2; 3 min fwd, 3 min bwd Thoracic ext in chair x10 Thoracic MWM UPAs and CPAs in sitting x10 Lat pull 25# 2x10 Standing shoulder ext 5# x10, 10# x10 Seated horizontal shoulder abd 10# 2x10 Standing against wall diagonals red TB 2x10  Standing against wall chops red TB 2x10 Wall slide maintaining trunk ext 2x10 Low trap setting against wall 2x10    05/17/23 Eliptical L3.0 x 6 min Lat pulls 25lb 2x10 Trunk rotations 5lb x 10 bil Kneeling chest supported on green pball: -I, T, Y, W 2x10 each Open books x 10 bil HS stretch with strap supine 2x30" Prone Quad stretch 2x30" bil  05/14/23 Doorway pec stretch low, mid, high x30" each Thoracic ext in chair x10 Seated lateral flexion x2 slowly one vertebrae at home Seated thoracic rotation x3 Seated thread the needle x3 Seated scap squeeze 2x10 Trigger Point Dry Needling  Subsequent Treatment: Instructions provided previously at initial dry needling treatment.   Patient Verbal Consent Given: Yes Education Handout Provided: Previously Provided Muscles Treated: bilat lumbar paraspinals Electrical Stimulation Performed: Yes, Parameters: 2 CPS milliAmps, intensity until muscle twitch x 8 min Treatment Response/Outcome: Decreased muscle tension STM & TPR bilat  thoracic paraspinals, periscapular muscles Grade II to III thoracic PAs and UPAs Standing counter lumbar ext x10 Standing shoulder ext green TB reactive iso 2x10 Standing row green TB reactive iso 2x10   05/10/23 Eliptical L1.0 x 6 min Supine LTR 5x10" both ways Open books x 10 bil NEUROMUSCULAR RE-EDUCATION: To improve proprioception, balance, and kinesthesia. TRX lat pulldown 2 x 10 TRX reverse fly 2x10 Ladder walk ups to top x 10 Ladder push ups x 10  Standing shoulder ext RTB - inc strain on R triceps  05/07/23 Seated  Pball flexion x5; lateral flexion x5 R&L  Piriformis stretch x 30"  Figure 4 stretch x 30" Standing doorway pec stretch low and at 90 deg of abd 2x30" each Trigger Point Dry Needling  Subsequent Treatment: Instructions provided previously at initial dry needling treatment.   Patient Verbal Consent Given: Yes Education Handout Provided: Previously Provided Muscles Treated: bilat lumbar paraspinals Electrical Stimulation Performed: Yes, Parameters: 2 CPS milliAmps, intensity until muscle twitch x 8 min  Treatment Response/Outcome: Twitch response, decreased muscle tension  Modified quadruped cat/cow x10 Modified quadruped "wag the tail"  Sitting on pball ant/post pelvic tilt x10    PATIENT EDUCATION:  Education details: HEP, findings, POC Person educated: Patient Education method: Explanation, Demonstration, Verbal cues, and Handouts Education comprehension: verbalized understanding and returned demonstration  HOME EXERCISE PROGRAM: Access Code: [email or web address] URL: [email or web address] Date: 05/21/2023 Prepared by: Gellen April Marie Nonato  Exercises - Hooklying Hamstring Stretch with Strap  - 1 x daily - 7 x weekly - 1 sets - 3 reps - 30 sec hold - Prone Quadriceps Stretch with Strap  - 1 x daily - 7 x weekly - 1 sets - 3 reps - 30 sec hold - Seated Piriformis Stretch  - 1 x daily - 7 x weekly - 2 sets - 30 sec hold - Seated Thoracic  Lumbar Extension with Pectoralis Stretch  - 1 x daily - 7 x weekly - 1 sets -  10 reps - Modified Cat Cow at Chair  - 1 x daily - 7 x weekly - 1 sets - 10 reps - Shoulder Extension Reactive Isometrics with Elbow Extended  - 1 x daily - 7 x weekly - 2 sets - 10 reps - Standing Shoulder Row Reactive Isometric  - 1 x daily - 7 x weekly - 2 sets - 10 reps - Low Trap Setting at Wall  - 1 x daily - 7 x weekly - 2 sets - 10 reps - Wall Squat  - 1 x daily - 7 x weekly - 2 sets - 10 reps  ASSESSMENT:  CLINICAL IMPRESSION: Pt has met goal for quad flexibility. Continued working on core stability to reduce LBP along with flexibility. Good demo of the interventions with min cuig required. He does continue to benefit from more mobility exercises.  OBJECTIVE IMPAIRMENTS: decreased activity tolerance, decreased mobility, decreased ROM, decreased strength, increased fascial restrictions, impaired perceived functional ability, increased muscle spasms, impaired flexibility, postural dysfunction, and pain.   ACTIVITY LIMITATIONS: lifting, bending, sitting, standing, and squatting  PARTICIPATION LIMITATIONS: meal prep, cleaning, laundry, shopping, and occupation  PERSONAL FACTORS: Past/current experiences, Time since onset of injury/illness/exacerbation, and 1-2 comorbidities: Hx R meniscal tear (2020), hx laminectomy L4-5, DM, R achilles tendonitis, chronic back pain are also affecting patient's functional outcome.   REHAB POTENTIAL: Good  CLINICAL DECISION MAKING: Evolving/moderate complexity  EVALUATION COMPLEXITY: Moderate   GOALS: Goals reviewed with patient? Yes  SHORT TERM GOALS: Target date: 05/08/2023   Patient will be independent with initial HEP.  Baseline:  Goal status: INITIAL   LONG TERM GOALS: Target date: 06/19/2023   Patient will be independent with advanced/ongoing HEP to improve outcomes and carryover.  Baseline:  Goal status: INITIAL  2.  Patient will report 75% improvement in  low back pain/stiffness to improve QOL.  Baseline:  Goal status: IN PROGRESS- 05/17/23- 60% improvement  3.  Patient will demonstrate improved hamstring extensibility by 25 deg bil to decrease pull on low back.  Baseline: measured through popliteal angle Right lacking 70 deg; Left lacking 70 deg  Goal status: IN PROGRESS  4.  Patient will demonstrate improved quad extensibility as being able to bend knees in prone by 15 deg bil.    Baseline: Right 60 deg; Left 70 deg  Goal status: MET- 06/07/23  PLAN:  PT FREQUENCY: 1-2x/week  PT DURATION: 8 weeks  PLANNED INTERVENTIONS: 97110-Therapeutic exercises, 97530- Therapeutic activity, 97112- Neuromuscular re-education, 97535- Self Care, 46962- Manual therapy, Taping, Dry Needling, Joint mobilization, Joint manipulation, Spinal manipulation, Spinal mobilization, Cryotherapy, and Moist heat.  PLAN FOR NEXT SESSION: progress/review stretches and lumbar/hip mobility as tolerated. Work on trunk extension/posterior shoulder/midback strengthening and core to decrease trunk flexion in standing   Namrata Dangler L Staysha Truby, PTA 06/07/2023, 2:11 PM

## 2023-06-11 ENCOUNTER — Ambulatory Visit: Admitting: Physical Therapy

## 2023-06-11 DIAGNOSIS — M6281 Muscle weakness (generalized): Secondary | ICD-10-CM | POA: Diagnosis not present

## 2023-06-11 DIAGNOSIS — R2689 Other abnormalities of gait and mobility: Secondary | ICD-10-CM

## 2023-06-11 DIAGNOSIS — M5459 Other low back pain: Secondary | ICD-10-CM | POA: Diagnosis not present

## 2023-06-11 DIAGNOSIS — R252 Cramp and spasm: Secondary | ICD-10-CM

## 2023-06-11 DIAGNOSIS — R293 Abnormal posture: Secondary | ICD-10-CM | POA: Diagnosis not present

## 2023-06-11 NOTE — Therapy (Signed)
 OUTPATIENT PHYSICAL THERAPY THORACOLUMBAR TREATMENT   Patient Name: Anthony Russell MRN: [id number] DOB:May 21, 1980, 43 y.o., male Today's Date: 06/11/2023  END OF SESSION:  PT End of Session - 06/11/23 1316     Visit Number 9    Date for PT Re-Evaluation 06/19/23    Authorization Type [employer]    PT Start Time 1316    PT Stop Time 1358    PT Time Calculation (min) 42 min    Activity Tolerance Patient tolerated treatment well    Behavior During Therapy WFL for tasks assessed/performed               Past Medical History:  Diagnosis Date   Allergy    Allergy, unspecified not elsewhere classified    rx w/ OTC antihistamines PRN   Anxiety    Asthma    Atypical chest pain    recent neg eval w/ normal cxr/ekg   Depression    Diverticulosis    DM (diabetes mellitus) (HCC)    Dyspepsia    on protonix  40mg /d   Esophagitis    GERD (gastroesophageal reflux disease)    on protonix  40mg /d   HTN (hypertension)    Hyperlipidemia    on diet alone   IBS (irritable bowel syndrome)    Lumbar back pain    s/p lumbar laminectomy 2007 by DrCabbell   Migraines    Overweight(278.02)    weight Jan10=246#.[name] was 225# in 9/07...diet and exercise was discussed   Sleep apnea    not wearing c-pap currently   Past Surgical History:  Procedure Laterality Date   DENTAL SURGERY     LUMBAR LAMINECTOMY  01/30/2005   DrCabbell   TEAR DUCT PROBING     Patient Active Problem List   Diagnosis Date Noted   Lumbar radiculopathy 05/10/2022   Capsulitis of left shoulder 05/10/2022   Sinus tachycardia 12/28/2021   Impingement of knee joint, right 12/20/2021   Achilles tendinitis, right leg 12/20/2021   Rotator cuff tendinitis, right 11/04/2021   Encounter for screening for other metabolic disorders 04/07/2021   Dyspnea on exertion 04/07/2021   Migraines 02/23/2021   Lumbar back pain 02/23/2021   HTN (hypertension) 02/23/2021   Dyspepsia 02/23/2021   DM (diabetes mellitus) (HCC) 02/23/2021    Nasal septal deviation 02/17/2021   Laryngitis 01/19/2021   Asymmetric tonsils 12/01/2020   Subacromial bursitis of right shoulder joint 05/17/2020   Hypersomnia 11/03/2015   Obesity 11/03/2015   Allergic rhinitis 04/15/2014   Asthma in adult 04/15/2014   GERD (gastroesophageal reflux disease) 04/15/2014   IBS (irritable bowel syndrome) 04/01/2013   Nausea alone 01/15/2013   Diarrhea 01/15/2013   Depression 10/15/2012   Lesion of eyebrow 02/09/2012   Gastroparesis 07/05/2011   Anxiety 07/05/2010   TESTOSTERONE  DEFICIENCY 05/26/2008   Hyperlipidemia 02/06/2008   Facet arthropathy, lumbar 02/06/2008   Atypical chest pain 02/06/2008    PCP: Sylvia Everts, PA-C   REFERRING PROVIDER: Frazier Jacob, DO   REFERRING DIAG: [phone] (ICD-10-CM) - Chronic bilateral low back pain without sciatica   Rationale for Evaluation and Treatment: Rehabilitation  THERAPY DIAG:  Other low back pain  Abnormal posture  Cramp and spasm  Muscle weakness (generalized)  Other abnormalities of gait and mobility  ONSET DATE: chronic worsening over last year   SUBJECTIVE:  SUBJECTIVE STATEMENT: Pt states he would still like to work on his ROM as much as he can. Has not had to worry about pain as much.   PERTINENT HISTORY:  Hx R meniscal tear (2020), hx laminectomy L4-5, DM, R achilles tendonitis, chronic back pain  PAIN:  Are you having pain? Yes: NPRS scale: 0/10 Pain location: low back  Pain description: stiffness, throbbing  Aggravating factors: moving especially after sitting prolong periods Relieving factors: laying down, changing positions  PRECAUTIONS: None  RED FLAGS: None   WEIGHT BEARING RESTRICTIONS: No  FALLS:  Has patient fallen in last 6 months? No  LIVING  ENVIRONMENT: Lives with: lives with their spouse Lives in: House/apartment Stairs: Yes: External: 2 flights steps; on right going up, on left going up, and can reach both Has following equipment at home: None  OCCUPATION: Sport and exercise psychologist, works at home; has sit down and stand up desk   PLOF: Independent and Leisure: boxing   PATIENT GOALS: move easier with better ROM and not hurt as much  NEXT MD VISIT: 08/31/2023 with Rheumatology  OBJECTIVE:   DIAGNOSTIC FINDINGS:  03/21/23 DG Lumbar spine FINDINGS: Substantial lumbar spondylosis with posterior element fusion at L4-L5-S1 and extensive facet spurring at all levels between L2 and S1. Moderate loss of intervertebral disc height at L4-5 and L5-S1.   No subluxation or appreciable fracture.   IMPRESSION: 1. Substantial lumbar spondylosis and degenerative disc disease. 2. Posterior element fusion at L4-L5-S1.  PATIENT SURVEYS:  Modified Oswestry 6/50   COGNITION: Overall cognitive status: Within functional limits for tasks assessed     SENSATION: WFL  MUSCLE LENGTH: Hamstrings: Right 70 deg; Left 70 deg - significant tightness bil Quadriceps in prone: Right 60 deg; Left 70 deg - significant tightness bil   POSTURE: decreased lumbar lordosis and increased thoracic kyphosis  PALPATION: Decreased mobility throughout lumbar spine with PA mobs,   LUMBAR ROM:   AROM eval  Flexion No lumbar flexion, compensates with hip and knee flexion  Extension Limited 90%, compensating with hip extension  Right lateral flexion Limited 90%, compensating hip flexion  Left lateral flexion Limited 90%, compensating hip flexion  Right rotation Limited 90%, compensating hip rotation  Left rotation Limited 90%, compensating hip rotation   (Blank rows = not tested)  LOWER EXTREMITY ROM:     L hip internal rotation 0, R hip internal rotation 16  LOWER EXTREMITY MMT:    MMT Right eval Left eval  Hip flexion 5 5  Hip extension 4 4  Hip  abduction 5 5  Hip adduction 5 5  Knee flexion 5 5  Knee extension 5 5  Ankle dorsiflexion Cam boot   Ankle plantarflexion Cam boot    (Blank rows = not tested)  GAIT: Distance walked: 50' Assistive device utilized: None Level of assistance: Complete Independence Comments: no deviation   TODAY'S TREATMENT:  DATE: 06/11/23 UBE L1-L4; 3 min fwd, 3 min bwd Seated thoracic ext in chair x10 Seated thoracic rotation in chair x10 Seated hamstring stretch 2 x 30" Standing calf stretch x30" Kneel to tall kneel hip hinge with 10# KB 2x10 Child's pose x30" with lateral flexion x30" Prone on pball "W", "I", "T" cervical retraction 2x10 Deadlift with 2x2# weights on ends of wooden stick 2x10 Single leg good mornings 2x2# weights on ends of wooden stick 2x10  06/07/23 Elliptical L2.0 x Quad flexibility: R- 108 , L-112 TRX power pull x 10 Bilat TRX torso rotation x 10 Bilat TRX good morning x 10 Bilat TRX pullovers x 10 Praying mantis orange pball x 10  Deadlift 5lb weight bil x 10 Passive trunk rotation stretch and HS stretch  05/21/23 UBE L2; 3 min fwd, 3 min bwd Thoracic ext in chair x10 Thoracic MWM UPAs and CPAs in sitting x10 Lat pull 25# 2x10 Standing shoulder ext 5# x10, 10# x10 Seated horizontal shoulder abd 10# 2x10 Standing against wall diagonals red TB 2x10  Standing against wall chops red TB 2x10 Wall slide maintaining trunk ext 2x10 Low trap setting against wall 2x10    05/17/23 Eliptical L3.0 x 6 min Lat pulls 25lb 2x10 Trunk rotations 5lb x 10 bil Kneeling chest supported on green pball: -I, T, Y, W 2x10 each Open books x 10 bil HS stretch with strap supine 2x30" Prone Quad stretch 2x30" bil  05/14/23 Doorway pec stretch low, mid, high x30" each Thoracic ext in chair x10 Seated lateral flexion x2 slowly one vertebrae at  home Seated thoracic rotation x3 Seated thread the needle x3 Seated scap squeeze 2x10 Trigger Point Dry Needling  Subsequent Treatment: Instructions provided previously at initial dry needling treatment.   Patient Verbal Consent Given: Yes Education Handout Provided: Previously Provided Muscles Treated: bilat lumbar paraspinals Electrical Stimulation Performed: Yes, Parameters: 2 CPS milliAmps, intensity until muscle twitch x 8 min Treatment Response/Outcome: Decreased muscle tension STM & TPR bilat thoracic paraspinals, periscapular muscles Grade II to III thoracic PAs and UPAs Standing counter lumbar ext x10 Standing shoulder ext green TB reactive iso 2x10 Standing row green TB reactive iso 2x10   05/10/23 Eliptical L1.0 x 6 min Supine LTR 5x10" both ways Open books x 10 bil NEUROMUSCULAR RE-EDUCATION: To improve proprioception, balance, and kinesthesia. TRX lat pulldown 2 x 10 TRX reverse fly 2x10 Ladder walk ups to top x 10 Ladder push ups x 10  Standing shoulder ext RTB - inc strain on R triceps  05/07/23 Seated  Pball flexion x5; lateral flexion x5 R&L  Piriformis stretch x 30"  Figure 4 stretch x 30" Standing doorway pec stretch low and at 90 deg of abd 2x30" each Trigger Point Dry Needling  Subsequent Treatment: Instructions provided previously at initial dry needling treatment.   Patient Verbal Consent Given: Yes Education Handout Provided: Previously Provided Muscles Treated: bilat lumbar paraspinals Electrical Stimulation Performed: Yes, Parameters: 2 CPS milliAmps, intensity until muscle twitch x 8 min  Treatment Response/Outcome: Twitch response, decreased muscle tension  Modified quadruped cat/cow x10 Modified quadruped "wag the tail"  Sitting on pball ant/post pelvic tilt x10    PATIENT EDUCATION:  Education details: HEP, findings, POC Person educated: Patient Education method: Explanation, Demonstration, Verbal cues, and Handouts Education  comprehension: verbalized understanding and returned demonstration  HOME EXERCISE PROGRAM: Access Code: [email or web address] URL: [email or web address] Date: 05/21/2023 Prepared by: Klark Vanderhoef April Marie Jeremian Whitby  Exercises - Hooklying Hamstring Stretch with Strap  - 1 x  daily - 7 x weekly - 1 sets - 3 reps - 30 sec hold - Prone Quadriceps Stretch with Strap  - 1 x daily - 7 x weekly - 1 sets - 3 reps - 30 sec hold - Seated Piriformis Stretch  - 1 x daily - 7 x weekly - 2 sets - 30 sec hold - Seated Thoracic Lumbar Extension with Pectoralis Stretch  - 1 x daily - 7 x weekly - 1 sets - 10 reps - Modified Cat Cow at Chair  - 1 x daily - 7 x weekly - 1 sets - 10 reps - Shoulder Extension Reactive Isometrics with Elbow Extended  - 1 x daily - 7 x weekly - 2 sets - 10 reps - Standing Shoulder Row Reactive Isometric  - 1 x daily - 7 x weekly - 2 sets - 10 reps - Low Trap Setting at Wall  - 1 x daily - 7 x weekly - 2 sets - 10 reps - Wall Squat  - 1 x daily - 7 x weekly - 2 sets - 10 reps  ASSESSMENT:  CLINICAL IMPRESSION: Session focused on trunk/hip extensor and core strength and endurance for improved postural stability. Working on improving hamstring length for bending tasks. Pt has had very minimal back pain. Still has some tightness but not sure how much of this will go away due to pt with fusions in his spine.   OBJECTIVE IMPAIRMENTS: decreased activity tolerance, decreased mobility, decreased ROM, decreased strength, increased fascial restrictions, impaired perceived functional ability, increased muscle spasms, impaired flexibility, postural dysfunction, and pain.   ACTIVITY LIMITATIONS: lifting, bending, sitting, standing, and squatting  PARTICIPATION LIMITATIONS: meal prep, cleaning, laundry, shopping, and occupation  PERSONAL FACTORS: Past/current experiences, Time since onset of injury/illness/exacerbation, and 1-2 comorbidities: Hx R meniscal tear (2020), hx laminectomy L4-5, DM, R  achilles tendonitis, chronic back pain are also affecting patient's functional outcome.   REHAB POTENTIAL: Good  CLINICAL DECISION MAKING: Evolving/moderate complexity  EVALUATION COMPLEXITY: Moderate   GOALS: Goals reviewed with patient? Yes  SHORT TERM GOALS: Target date: 05/08/2023   Patient will be independent with initial HEP.  Baseline:  Goal status: INITIAL   LONG TERM GOALS: Target date: 06/19/2023   Patient will be independent with advanced/ongoing HEP to improve outcomes and carryover.  Baseline:  Goal status: INITIAL  2.  Patient will report 75% improvement in low back pain/stiffness to improve QOL.  Baseline:  Goal status: IN PROGRESS- 05/17/23- 60% improvement  3.  Patient will demonstrate improved hamstring extensibility by 25 deg bil to decrease pull on low back.  Baseline: measured through popliteal angle Right lacking 70 deg; Left lacking 70 deg  Goal status: IN PROGRESS  4.  Patient will demonstrate improved quad extensibility as being able to bend knees in prone by 15 deg bil.    Baseline: Right 60 deg; Left 70 deg  Goal status: MET- 06/07/23  PLAN:  PT FREQUENCY: 1-2x/week  PT DURATION: 8 weeks  PLANNED INTERVENTIONS: 97110-Therapeutic exercises, 97530- Therapeutic activity, 97112- Neuromuscular re-education, 97535- Self Care, 16109- Manual therapy, Taping, Dry Needling, Joint mobilization, Joint manipulation, Spinal manipulation, Spinal mobilization, Cryotherapy, and Moist heat.  PLAN FOR NEXT SESSION: progress/review stretches and lumbar/hip mobility as tolerated. Work on trunk extension/posterior shoulder/midback strengthening and core to decrease trunk flexion in standing   Reizel Calzada April Ma L Gayatri Teasdale, PT 06/11/2023, 1:16 PM

## 2023-06-14 ENCOUNTER — Ambulatory Visit

## 2023-06-14 DIAGNOSIS — R2689 Other abnormalities of gait and mobility: Secondary | ICD-10-CM | POA: Diagnosis not present

## 2023-06-14 DIAGNOSIS — M6281 Muscle weakness (generalized): Secondary | ICD-10-CM

## 2023-06-14 DIAGNOSIS — M5459 Other low back pain: Secondary | ICD-10-CM

## 2023-06-14 DIAGNOSIS — R252 Cramp and spasm: Secondary | ICD-10-CM | POA: Diagnosis not present

## 2023-06-14 DIAGNOSIS — R293 Abnormal posture: Secondary | ICD-10-CM

## 2023-06-14 NOTE — Therapy (Signed)
 OUTPATIENT PHYSICAL THERAPY THORACOLUMBAR TREATMENT   Patient Name: Anthony Russell MRN: [id number] DOB:18-Sep-1980, 43 y.o., male Today's Date: 06/14/2023  END OF SESSION:  PT End of Session - 06/14/23 1446     Visit Number 10    Date for PT Re-Evaluation 06/19/23    Authorization Type [employer]    PT Start Time 1403    PT Stop Time 1444    PT Time Calculation (min) 41 min    Activity Tolerance Patient tolerated treatment well    Behavior During Therapy WFL for tasks assessed/performed                Past Medical History:  Diagnosis Date   Allergy    Allergy, unspecified not elsewhere classified    rx w/ OTC antihistamines PRN   Anxiety    Asthma    Atypical chest pain    recent neg eval w/ normal cxr/ekg   Depression    Diverticulosis    DM (diabetes mellitus) (HCC)    Dyspepsia    on protonix  40mg /d   Esophagitis    GERD (gastroesophageal reflux disease)    on protonix  40mg /d   HTN (hypertension)    Hyperlipidemia    on diet alone   IBS (irritable bowel syndrome)    Lumbar back pain    s/p lumbar laminectomy 2007 by DrCabbell   Migraines    Overweight(278.02)    weight Jan10=246#.[name] was 225# in 9/07...diet and exercise was discussed   Sleep apnea    not wearing c-pap currently   Past Surgical History:  Procedure Laterality Date   DENTAL SURGERY     LUMBAR LAMINECTOMY  01/30/2005   DrCabbell   TEAR DUCT PROBING     Patient Active Problem List   Diagnosis Date Noted   Lumbar radiculopathy 05/10/2022   Capsulitis of left shoulder 05/10/2022   Sinus tachycardia 12/28/2021   Impingement of knee joint, right 12/20/2021   Achilles tendinitis, right leg 12/20/2021   Rotator cuff tendinitis, right 11/04/2021   Encounter for screening for other metabolic disorders 04/07/2021   Dyspnea on exertion 04/07/2021   Migraines 02/23/2021   Lumbar back pain 02/23/2021   HTN (hypertension) 02/23/2021   Dyspepsia 02/23/2021   DM (diabetes mellitus) (HCC)  02/23/2021   Nasal septal deviation 02/17/2021   Laryngitis 01/19/2021   Asymmetric tonsils 12/01/2020   Subacromial bursitis of right shoulder joint 05/17/2020   Hypersomnia 11/03/2015   Obesity 11/03/2015   Allergic rhinitis 04/15/2014   Asthma in adult 04/15/2014   GERD (gastroesophageal reflux disease) 04/15/2014   IBS (irritable bowel syndrome) 04/01/2013   Nausea alone 01/15/2013   Diarrhea 01/15/2013   Depression 10/15/2012   Lesion of eyebrow 02/09/2012   Gastroparesis 07/05/2011   Anxiety 07/05/2010   TESTOSTERONE  DEFICIENCY 05/26/2008   Hyperlipidemia 02/06/2008   Facet arthropathy, lumbar 02/06/2008   Atypical chest pain 02/06/2008    PCP: Sylvia Everts, PA-C   REFERRING PROVIDER: Frazier Jacob, DO   REFERRING DIAG: [phone] (ICD-10-CM) - Chronic bilateral low back pain without sciatica   Rationale for Evaluation and Treatment: Rehabilitation  THERAPY DIAG:  Other low back pain  Abnormal posture  Cramp and spasm  Muscle weakness (generalized)  Other abnormalities of gait and mobility  ONSET DATE: chronic worsening over last year   SUBJECTIVE:  SUBJECTIVE STATEMENT: Pt still reporting tightness in his spine.  PERTINENT HISTORY:  Hx R meniscal tear (2020), hx laminectomy L4-5, DM, R achilles tendonitis, chronic back pain  PAIN:  Are you having pain? Yes: NPRS scale: 0/10 Pain location: low back  Pain description: stiffness, throbbing  Aggravating factors: moving especially after sitting prolong periods Relieving factors: laying down, changing positions  PRECAUTIONS: None  RED FLAGS: None   WEIGHT BEARING RESTRICTIONS: No  FALLS:  Has patient fallen in last 6 months? No  LIVING ENVIRONMENT: Lives with: lives with their spouse Lives in:  House/apartment Stairs: Yes: External: 2 flights steps; on right going up, on left going up, and can reach both Has following equipment at home: None  OCCUPATION: Sport and exercise psychologist, works at home; has sit down and stand up desk   PLOF: Independent and Leisure: boxing   PATIENT GOALS: move easier with better ROM and not hurt as much  NEXT MD VISIT: 08/31/2023 with Rheumatology  OBJECTIVE:   DIAGNOSTIC FINDINGS:  03/21/23 DG Lumbar spine FINDINGS: Substantial lumbar spondylosis with posterior element fusion at L4-L5-S1 and extensive facet spurring at all levels between L2 and S1. Moderate loss of intervertebral disc height at L4-5 and L5-S1.   No subluxation or appreciable fracture.   IMPRESSION: 1. Substantial lumbar spondylosis and degenerative disc disease. 2. Posterior element fusion at L4-L5-S1.  PATIENT SURVEYS:  Modified Oswestry 6/50   COGNITION: Overall cognitive status: Within functional limits for tasks assessed     SENSATION: WFL  MUSCLE LENGTH: Hamstrings: Right 70 deg; Left 70 deg - significant tightness bil Quadriceps in prone: Right 60 deg; Left 70 deg - significant tightness bil   POSTURE: decreased lumbar lordosis and increased thoracic kyphosis  PALPATION: Decreased mobility throughout lumbar spine with PA mobs,   LUMBAR ROM:   AROM eval  Flexion No lumbar flexion, compensates with hip and knee flexion  Extension Limited 90%, compensating with hip extension  Right lateral flexion Limited 90%, compensating hip flexion  Left lateral flexion Limited 90%, compensating hip flexion  Right rotation Limited 90%, compensating hip rotation  Left rotation Limited 90%, compensating hip rotation   (Blank rows = not tested)  LOWER EXTREMITY ROM:     L hip internal rotation 0, R hip internal rotation 16  LOWER EXTREMITY MMT:    MMT Right eval Left eval  Hip flexion 5 5  Hip extension 4 4  Hip abduction 5 5  Hip adduction 5 5  Knee flexion 5 5  Knee  extension 5 5  Ankle dorsiflexion Cam boot   Ankle plantarflexion Cam boot    (Blank rows = not tested)  GAIT: Distance walked: 50' Assistive device utilized: None Level of assistance: Complete Independence Comments: no deviation   TODAY'S TREATMENT:                                                                                                                              DATE: 06/14/23  Elliptical L3.0 x 6 min Seated thoracic ext in chair + foam roll x10 Seated thoracic ext + horizontal ABD in chair + foam roll x10 Seated thoracic rotation in chair x10 Prone on pball "W", "I", "T" 1lb x 10 each Sidelying open book 2x5 B Kneeling push ups 2 x 10 Quadruped rock fwd and back x 10  Thread the needle Leg press 35lb 2x10 BLE  06/11/23 UBE L1-L4; 3 min fwd, 3 min bwd Seated thoracic ext in chair x10 Seated thoracic rotation in chair x10 Seated hamstring stretch 2 x 30" Standing calf stretch x30" Kneel to tall kneel hip hinge with 10# KB 2x10 Child's pose x30" with lateral flexion x30" Prone on pball "W", "I", "T" cervical retraction 2x10 Deadlift with 2x2# weights on ends of wooden stick 2x10 Single leg good mornings 2x2# weights on ends of wooden stick 2x10  06/07/23 Elliptical L2.0 x Quad flexibility: R- 108 , L-112 TRX power pull x 10 Bilat TRX torso rotation x 10 Bilat TRX good morning x 10 Bilat TRX pullovers x 10 Praying mantis orange pball x 10  Deadlift 5lb weight bil x 10 Passive trunk rotation stretch and HS stretch  05/21/23 UBE L2; 3 min fwd, 3 min bwd Thoracic ext in chair x10 Thoracic MWM UPAs and CPAs in sitting x10 Lat pull 25# 2x10 Standing shoulder ext 5# x10, 10# x10 Seated horizontal shoulder abd 10# 2x10 Standing against wall diagonals red TB 2x10  Standing against wall chops red TB 2x10 Wall slide maintaining trunk ext 2x10 Low trap setting against wall 2x10    05/17/23 Eliptical L3.0 x 6 min Lat pulls 25lb 2x10 Trunk rotations 5lb x 10  bil Kneeling chest supported on green pball: -I, T, Y, W 2x10 each Open books x 10 bil HS stretch with strap supine 2x30" Prone Quad stretch 2x30" bil  05/14/23 Doorway pec stretch low, mid, high x30" each Thoracic ext in chair x10 Seated lateral flexion x2 slowly one vertebrae at home Seated thoracic rotation x3 Seated thread the needle x3 Seated scap squeeze 2x10 Trigger Point Dry Needling  Subsequent Treatment: Instructions provided previously at initial dry needling treatment.   Patient Verbal Consent Given: Yes Education Handout Provided: Previously Provided Muscles Treated: bilat lumbar paraspinals Electrical Stimulation Performed: Yes, Parameters: 2 CPS milliAmps, intensity until muscle twitch x 8 min Treatment Response/Outcome: Decreased muscle tension STM & TPR bilat thoracic paraspinals, periscapular muscles Grade II to III thoracic PAs and UPAs Standing counter lumbar ext x10 Standing shoulder ext green TB reactive iso 2x10 Standing row green TB reactive iso 2x10   05/10/23 Eliptical L1.0 x 6 min Supine LTR 5x10" both ways Open books x 10 bil NEUROMUSCULAR RE-EDUCATION: To improve proprioception, balance, and kinesthesia. TRX lat pulldown 2 x 10 TRX reverse fly 2x10 Ladder walk ups to top x 10 Ladder push ups x 10  Standing shoulder ext RTB - inc strain on R triceps  05/07/23 Seated  Pball flexion x5; lateral flexion x5 R&L  Piriformis stretch x 30"  Figure 4 stretch x 30" Standing doorway pec stretch low and at 90 deg of abd 2x30" each Trigger Point Dry Needling  Subsequent Treatment: Instructions provided previously at initial dry needling treatment.   Patient Verbal Consent Given: Yes Education Handout Provided: Previously Provided Muscles Treated: bilat lumbar paraspinals Electrical Stimulation Performed: Yes, Parameters: 2 CPS milliAmps, intensity until muscle twitch x 8 min  Treatment Response/Outcome: Twitch response, decreased muscle  tension  Modified quadruped cat/cow x10 Modified quadruped "  wag the tail"  Sitting on pball ant/post pelvic tilt x10    PATIENT EDUCATION:  Education details: HEP, findings, POC Person educated: Patient Education method: Explanation, Demonstration, Verbal cues, and Handouts Education comprehension: verbalized understanding and returned demonstration  HOME EXERCISE PROGRAM: Access Code: [email or web address] URL: [email or web address] Date: 05/21/2023 Prepared by: Gellen April Erman Hayward  Exercises - Hooklying Hamstring Stretch with Strap  - 1 x daily - 7 x weekly - 1 sets - 3 reps - 30 sec hold - Prone Quadriceps Stretch with Strap  - 1 x daily - 7 x weekly - 1 sets - 3 reps - 30 sec hold - Seated Piriformis Stretch  - 1 x daily - 7 x weekly - 2 sets - 30 sec hold - Seated Thoracic Lumbar Extension with Pectoralis Stretch  - 1 x daily - 7 x weekly - 1 sets - 10 reps - Modified Cat Cow at Chair  - 1 x daily - 7 x weekly - 1 sets - 10 reps - Shoulder Extension Reactive Isometrics with Elbow Extended  - 1 x daily - 7 x weekly - 2 sets - 10 reps - Standing Shoulder Row Reactive Isometric  - 1 x daily - 7 x weekly - 2 sets - 10 reps - Low Trap Setting at Wall  - 1 x daily - 7 x weekly - 2 sets - 10 reps - Wall Squat  - 1 x daily - 7 x weekly - 2 sets - 10 reps  ASSESSMENT:  CLINICAL IMPRESSION: Session focused on trunk/hip extensor and core strength and endurance for improved postural stability. Still has some tightness but not sure how much of this will go away due to pt with fusions in his spine. He continues to show good response, will probably be ready to wrap up come next visit.   OBJECTIVE IMPAIRMENTS: decreased activity tolerance, decreased mobility, decreased ROM, decreased strength, increased fascial restrictions, impaired perceived functional ability, increased muscle spasms, impaired flexibility, postural dysfunction, and pain.   ACTIVITY LIMITATIONS: lifting, bending,  sitting, standing, and squatting  PARTICIPATION LIMITATIONS: meal prep, cleaning, laundry, shopping, and occupation  PERSONAL FACTORS: Past/current experiences, Time since onset of injury/illness/exacerbation, and 1-2 comorbidities: Hx R meniscal tear (2020), hx laminectomy L4-5, DM, R achilles tendonitis, chronic back pain are also affecting patient's functional outcome.   REHAB POTENTIAL: Good  CLINICAL DECISION MAKING: Evolving/moderate complexity  EVALUATION COMPLEXITY: Moderate   GOALS: Goals reviewed with patient? Yes  SHORT TERM GOALS: Target date: 05/08/2023   Patient will be independent with initial HEP.  Baseline:  Goal status: INITIAL   LONG TERM GOALS: Target date: 06/19/2023   Patient will be independent with advanced/ongoing HEP to improve outcomes and carryover.  Baseline:  Goal status: INITIAL  2.  Patient will report 75% improvement in low back pain/stiffness to improve QOL.  Baseline:  Goal status: IN PROGRESS- 05/17/23- 60% improvement  3.  Patient will demonstrate improved hamstring extensibility by 25 deg bil to decrease pull on low back.  Baseline: measured through popliteal angle Right lacking 70 deg; Left lacking 70 deg  Goal status: IN PROGRESS  4.  Patient will demonstrate improved quad extensibility as being able to bend knees in prone by 15 deg bil.    Baseline: Right 60 deg; Left 70 deg  Goal status: MET- 06/07/23  PLAN:  PT FREQUENCY: 1-2x/week  PT DURATION: 8 weeks  PLANNED INTERVENTIONS: 97110-Therapeutic exercises, 97530- Therapeutic activity, 97112- Neuromuscular re-education, 97535- Self Care, 16109- Manual therapy, Taping, Dry Needling,  Joint mobilization, Joint manipulation, Spinal manipulation, Spinal mobilization, Cryotherapy, and Moist heat.  PLAN FOR NEXT SESSION: progress/review stretches and lumbar/hip mobility as tolerated. Work on trunk extension/posterior shoulder/midback strengthening and core to decrease trunk flexion in  standing   Antinio Sanderfer L Chyann Ambrocio, PTA 06/14/2023, 2:46 PM

## 2023-06-18 ENCOUNTER — Ambulatory Visit: Admitting: Physical Therapy

## 2023-06-18 DIAGNOSIS — M5459 Other low back pain: Secondary | ICD-10-CM

## 2023-06-18 DIAGNOSIS — R293 Abnormal posture: Secondary | ICD-10-CM

## 2023-06-18 DIAGNOSIS — R252 Cramp and spasm: Secondary | ICD-10-CM | POA: Diagnosis not present

## 2023-06-18 DIAGNOSIS — R2689 Other abnormalities of gait and mobility: Secondary | ICD-10-CM

## 2023-06-18 DIAGNOSIS — M6281 Muscle weakness (generalized): Secondary | ICD-10-CM

## 2023-06-18 NOTE — Therapy (Signed)
 OUTPATIENT PHYSICAL THERAPY THORACOLUMBAR TREATMENT AND DISCHARGE  PHYSICAL THERAPY DISCHARGE SUMMARY  Visits from Start of Care: 11  Current functional level related to goals / functional outcomes: See below. Overall no pain just tightness.    Remaining deficits: See below. Mostly still hamstring tightness that pulls into low back and hip flexor tightness   Education / Equipment: Final HEP   Patient agrees to discharge. Patient goals were met. Patient is being discharged due to meeting the stated rehab goals.    Patient Name: Anthony Russell MRN: [id number] DOB:Jul 25, 1980, 43 y.o., male Today's Date: 06/18/2023  END OF SESSION:  PT End of Session - 06/18/23 1403     Visit Number 11    Date for PT Re-Evaluation 06/19/23    Authorization Type [employer]    PT Start Time 1401    PT Stop Time 1440    PT Time Calculation (min) 39 min    Activity Tolerance Patient tolerated treatment well    Behavior During Therapy WFL for tasks assessed/performed             Past Medical History:  Diagnosis Date   Allergy    Allergy, unspecified not elsewhere classified    rx w/ OTC antihistamines PRN   Anxiety    Asthma    Atypical chest pain    recent neg eval w/ normal cxr/ekg   Depression    Diverticulosis    DM (diabetes mellitus) (HCC)    Dyspepsia    on protonix  40mg /d   Esophagitis    GERD (gastroesophageal reflux disease)    on protonix  40mg /d   HTN (hypertension)    Hyperlipidemia    on diet alone   IBS (irritable bowel syndrome)    Lumbar back pain    s/p lumbar laminectomy 2007 by DrCabbell   Migraines    Overweight(278.02)    weight Jan10=246#.[name] was 225# in 9/07...diet and exercise was discussed   Sleep apnea    not wearing c-pap currently   Past Surgical History:  Procedure Laterality Date   DENTAL SURGERY     LUMBAR LAMINECTOMY  01/30/2005   DrCabbell   TEAR DUCT PROBING     Patient Active Problem List   Diagnosis Date Noted   Lumbar radiculopathy  05/10/2022   Capsulitis of left shoulder 05/10/2022   Sinus tachycardia 12/28/2021   Impingement of knee joint, right 12/20/2021   Achilles tendinitis, right leg 12/20/2021   Rotator cuff tendinitis, right 11/04/2021   Encounter for screening for other metabolic disorders 04/07/2021   Dyspnea on exertion 04/07/2021   Migraines 02/23/2021   Lumbar back pain 02/23/2021   HTN (hypertension) 02/23/2021   Dyspepsia 02/23/2021   DM (diabetes mellitus) (HCC) 02/23/2021   Nasal septal deviation 02/17/2021   Laryngitis 01/19/2021   Asymmetric tonsils 12/01/2020   Subacromial bursitis of right shoulder joint 05/17/2020   Hypersomnia 11/03/2015   Obesity 11/03/2015   Allergic rhinitis 04/15/2014   Asthma in adult 04/15/2014   GERD (gastroesophageal reflux disease) 04/15/2014   IBS (irritable bowel syndrome) 04/01/2013   Nausea alone 01/15/2013   Diarrhea 01/15/2013   Depression 10/15/2012   Lesion of eyebrow 02/09/2012   Gastroparesis 07/05/2011   Anxiety 07/05/2010   TESTOSTERONE  DEFICIENCY 05/26/2008   Hyperlipidemia 02/06/2008   Facet arthropathy, lumbar 02/06/2008   Atypical chest pain 02/06/2008    PCP: Sylvia Everts, PA-C   REFERRING PROVIDER: Frazier Jacob, DO   REFERRING DIAG: [phone] (ICD-10-CM) - Chronic bilateral low back pain without sciatica  Rationale for Evaluation and Treatment: Rehabilitation  THERAPY DIAG:  Other low back pain  Abnormal posture  Cramp and spasm  Muscle weakness (generalized)  Other abnormalities of gait and mobility  ONSET DATE: chronic worsening over last year   SUBJECTIVE:                                                                                                                                                                                           SUBJECTIVE STATEMENT: Continued tightness. Has been feeling it some in his R hamstring when crouching down.   PERTINENT HISTORY:  Hx R meniscal tear (2020), hx  laminectomy L4-5, DM, R achilles tendonitis, chronic back pain  PAIN:  Are you having pain? Yes: NPRS scale: 0/10 Pain location: low back  Pain description: stiffness, throbbing  Aggravating factors: moving especially after sitting prolong periods Relieving factors: laying down, changing positions  PRECAUTIONS: None  RED FLAGS: None   WEIGHT BEARING RESTRICTIONS: No  FALLS:  Has patient fallen in last 6 months? No  LIVING ENVIRONMENT: Lives with: lives with their spouse Lives in: House/apartment Stairs: Yes: External: 2 flights steps; on right going up, on left going up, and can reach both Has following equipment at home: None  OCCUPATION: Sport and exercise psychologist, works at home; has sit down and stand up desk   PLOF: Independent and Leisure: boxing   PATIENT GOALS: move easier with better ROM and not hurt as much  NEXT MD VISIT: 08/31/2023 with Rheumatology  OBJECTIVE:   DIAGNOSTIC FINDINGS:  03/21/23 DG Lumbar spine FINDINGS: Substantial lumbar spondylosis with posterior element fusion at L4-L5-S1 and extensive facet spurring at all levels between L2 and S1. Moderate loss of intervertebral disc height at L4-5 and L5-S1.   No subluxation or appreciable fracture.   IMPRESSION: 1. Substantial lumbar spondylosis and degenerative disc disease. 2. Posterior element fusion at L4-L5-S1.  PATIENT SURVEYS:  Modified Oswestry 6/50   COGNITION: Overall cognitive status: Within functional limits for tasks assessed     SENSATION: WFL  MUSCLE LENGTH: Hamstrings: Right 70 deg; Left 70 deg - significant tightness bil Quadriceps in prone: Right 60 deg; Left 70 deg - significant tightness bil   POSTURE: decreased lumbar lordosis and increased thoracic kyphosis  PALPATION: Decreased mobility throughout lumbar spine with PA mobs,   LUMBAR ROM:   AROM eval  Flexion No lumbar flexion, compensates with hip and knee flexion  Extension Limited 90%, compensating with hip extension   Right lateral flexion Limited 90%, compensating hip flexion  Left lateral flexion Limited 90%, compensating hip flexion  Right rotation Limited 90%, compensating hip rotation  Left rotation Limited 90%,  compensating hip rotation   (Blank rows = not tested)  LOWER EXTREMITY ROM:     L hip internal rotation 0, R hip internal rotation 16  LOWER EXTREMITY MMT:    MMT Right eval Left eval  Hip flexion 5 5  Hip extension 4 4  Hip abduction 5 5  Hip adduction 5 5  Knee flexion 5 5  Knee extension 5 5  Ankle dorsiflexion Cam boot   Ankle plantarflexion Cam boot    (Blank rows = not tested)  GAIT: Distance walked: 50' Assistive device utilized: None Level of assistance: Complete Independence Comments: no deviation   TODAY'S TREATMENT:                                                                                                                              DATE: 06/18/23 UBE L3, 3 min fwd, 3 min bwd Seated on pball x10 each:  Swiss ball walk outs with lumbar ext stretch 5x10" Pelvic circles CW & CCW  Post/ant pelvic tilt  Marching   Hamstring stretch x30" Prone on pball 2x10 each:  "I", "W", "T", "Y"  Deadlift 10# KB   06/14/23 Elliptical L3.0 x 6 min Seated thoracic ext in chair + foam roll x10 Seated thoracic ext + horizontal ABD in chair + foam roll x10 Seated thoracic rotation in chair x10 Prone on pball "W", "I", "T" 1lb x 10 each Sidelying open book 2x5 B Kneeling push ups 2 x 10 Quadruped rock fwd and back x 10  Thread the needle Leg press 35lb 2x10 BLE  06/11/23 UBE L1-L4; 3 min fwd, 3 min bwd Seated thoracic ext in chair x10 Seated thoracic rotation in chair x10 Seated hamstring stretch 2 x 30" Standing calf stretch x30" Kneel to tall kneel hip hinge with 10# KB 2x10 Child's pose x30" with lateral flexion x30" Prone on pball "W", "I", "T" cervical retraction 2x10 Deadlift with 2x2# weights on ends of wooden stick 2x10 Single leg good mornings 2x2#  weights on ends of wooden stick 2x10  06/07/23 Elliptical L2.0 x Quad flexibility: R- 108 , L-112 TRX power pull x 10 Bilat TRX torso rotation x 10 Bilat TRX good morning x 10 Bilat TRX pullovers x 10 Praying mantis orange pball x 10  Deadlift 5lb weight bil x 10 Passive trunk rotation stretch and HS stretch  05/21/23 UBE L2; 3 min fwd, 3 min bwd Thoracic ext in chair x10 Thoracic MWM UPAs and CPAs in sitting x10 Lat pull 25# 2x10 Standing shoulder ext 5# x10, 10# x10 Seated horizontal shoulder abd 10# 2x10 Standing against wall diagonals red TB 2x10  Standing against wall chops red TB 2x10 Wall slide maintaining trunk ext 2x10 Low trap setting against wall 2x10    05/17/23 Eliptical L3.0 x 6 min Lat pulls 25lb 2x10 Trunk rotations 5lb x 10 bil Kneeling chest supported on green pball: -I, T, Y, W 2x10 each Open books x 10 bil  HS stretch with strap supine 2x30" Prone Quad stretch 2x30" bil  05/14/23 Doorway pec stretch low, mid, high x30" each Thoracic ext in chair x10 Seated lateral flexion x2 slowly one vertebrae at home Seated thoracic rotation x3 Seated thread the needle x3 Seated scap squeeze 2x10 Trigger Point Dry Needling  Subsequent Treatment: Instructions provided previously at initial dry needling treatment.   Patient Verbal Consent Given: Yes Education Handout Provided: Previously Provided Muscles Treated: bilat lumbar paraspinals Electrical Stimulation Performed: Yes, Parameters: 2 CPS milliAmps, intensity until muscle twitch x 8 min Treatment Response/Outcome: Decreased muscle tension STM & TPR bilat thoracic paraspinals, periscapular muscles Grade II to III thoracic PAs and UPAs Standing counter lumbar ext x10 Standing shoulder ext green TB reactive iso 2x10 Standing row green TB reactive iso 2x10   05/10/23 Eliptical L1.0 x 6 min Supine LTR 5x10" both ways Open books x 10 bil NEUROMUSCULAR RE-EDUCATION: To improve proprioception, balance,  and kinesthesia. TRX lat pulldown 2 x 10 TRX reverse fly 2x10 Ladder walk ups to top x 10 Ladder push ups x 10  Standing shoulder ext RTB - inc strain on R triceps  05/07/23 Seated  Pball flexion x5; lateral flexion x5 R&L  Piriformis stretch x 30"  Figure 4 stretch x 30" Standing doorway pec stretch low and at 90 deg of abd 2x30" each Trigger Point Dry Needling  Subsequent Treatment: Instructions provided previously at initial dry needling treatment.   Patient Verbal Consent Given: Yes Education Handout Provided: Previously Provided Muscles Treated: bilat lumbar paraspinals Electrical Stimulation Performed: Yes, Parameters: 2 CPS milliAmps, intensity until muscle twitch x 8 min  Treatment Response/Outcome: Twitch response, decreased muscle tension  Modified quadruped cat/cow x10 Modified quadruped "wag the tail"  Sitting on pball ant/post pelvic tilt x10    PATIENT EDUCATION:  Education details: HEP, findings, POC Person educated: Patient Education method: Explanation, Demonstration, Verbal cues, and Handouts Education comprehension: verbalized understanding and returned demonstration  HOME EXERCISE PROGRAM: Access Code: [email or web address] URL: [email or web address] Date: 05/21/2023 Prepared by: Eulalio Reamy April Erman [place]  Exercises - Hooklying Hamstring Stretch with Strap  - 1 x daily - 7 x weekly - 1 sets - 3 reps - 30 sec hold - Prone Quadriceps Stretch with Strap  - 1 x daily - 7 x weekly - 1 sets - 3 reps - 30 sec hold - Seated Piriformis Stretch  - 1 x daily - 7 x weekly - 2 sets - 30 sec hold - Seated Thoracic Lumbar Extension with Pectoralis Stretch  - 1 x daily - 7 x weekly - 1 sets - 10 reps - Modified Cat Cow at Chair  - 1 x daily - 7 x weekly - 1 sets - 10 reps - Shoulder Extension Reactive Isometrics with Elbow Extended  - 1 x daily - 7 x weekly - 2 sets - 10 reps - Standing Shoulder Row Reactive Isometric  - 1 x daily - 7 x weekly - 2 sets - 10 reps -  Low Trap Setting at Wall  - 1 x daily - 7 x weekly - 2 sets - 10 reps - Wall Squat  - 1 x daily - 7 x weekly - 2 sets - 10 reps  ASSESSMENT:  CLINICAL IMPRESSION: Finalized pt's HEP. Demonstrated multiple exercises on pball as pt states he gets a really good work out with this. Continued to work on postural strength and stability with pball. Pt has some continued hip flexor and hamstring tightness from his general posture but overall  improved since evaluation. Pt is knowledgeable on what stretches to continue and to continue to strengthen his trunk and hip extensors. Pt feels ready to d/c at this time.   OBJECTIVE IMPAIRMENTS: decreased activity tolerance, decreased mobility, decreased ROM, decreased strength, increased fascial restrictions, impaired perceived functional ability, increased muscle spasms, impaired flexibility, postural dysfunction, and pain.   ACTIVITY LIMITATIONS: lifting, bending, sitting, standing, and squatting  PARTICIPATION LIMITATIONS: meal prep, cleaning, laundry, shopping, and occupation  PERSONAL FACTORS: Past/current experiences, Time since onset of injury/illness/exacerbation, and 1-2 comorbidities: Hx R meniscal tear (2020), hx laminectomy L4-5, DM, R achilles tendonitis, chronic back pain are also affecting patient's functional outcome.   REHAB POTENTIAL: Good  CLINICAL DECISION MAKING: Evolving/moderate complexity  EVALUATION COMPLEXITY: Moderate   GOALS: Goals reviewed with patient? Yes  SHORT TERM GOALS: Target date: 05/08/2023   Patient will be independent with initial HEP.  Baseline:  Goal status: MET   LONG TERM GOALS: Target date: 06/19/2023   Patient will be independent with advanced/ongoing HEP to improve outcomes and carryover.  Baseline:  Goal status: MET  2.  Patient will report 75% improvement in low back pain/stiffness to improve QOL.  Baseline:  05/17/23- 60% improvement 06/18/23- 75% improvement (more stiffness, no pain) Goal status:  MET  3.  Patient will demonstrate improved hamstring extensibility by 25 deg bil to decrease pull on low back.  Baseline: measured through popliteal angle Right lacking 70 deg; Left lacking 70 deg  06/18/23- Left lacking 40 deg, right lacking 45 deg Goal status: MET  4.  Patient will demonstrate improved quad extensibility as being able to bend knees in prone by 15 deg bil.    Baseline: Right 60 deg; Left 70 deg  Goal status: MET- 06/07/23  PLAN:  PT FREQUENCY: 1-2x/week  PT DURATION: 8 weeks  PLANNED INTERVENTIONS: 97110-Therapeutic exercises, 97530- Therapeutic activity, 97112- Neuromuscular re-education, 97535- Self Care, 60454- Manual therapy, Taping, Dry Needling, Joint mobilization, Joint manipulation, Spinal manipulation, Spinal mobilization, Cryotherapy, and Moist heat.  PLAN FOR NEXT SESSION: progress/review stretches and lumbar/hip mobility as tolerated. Work on trunk extension/posterior shoulder/midback strengthening and core to decrease trunk flexion in standing   Rohit Deloria April Ma L Danni Shima, PT, DPT 06/18/2023, 2:04 PM

## 2023-06-19 ENCOUNTER — Emergency Department (HOSPITAL_BASED_OUTPATIENT_CLINIC_OR_DEPARTMENT_OTHER)
Admission: EM | Admit: 2023-06-19 | Discharge: 2023-06-19 | Disposition: A | Discharge status: Home / Self Care | Attending: Emergency Medicine | Admitting: Emergency Medicine

## 2023-06-19 ENCOUNTER — Other Ambulatory Visit: Payer: Self-pay

## 2023-06-19 ENCOUNTER — Emergency Department (HOSPITAL_BASED_OUTPATIENT_CLINIC_OR_DEPARTMENT_OTHER)

## 2023-06-19 ENCOUNTER — Encounter (HOSPITAL_BASED_OUTPATIENT_CLINIC_OR_DEPARTMENT_OTHER): Payer: Self-pay

## 2023-06-19 DIAGNOSIS — I10 Essential (primary) hypertension: Secondary | ICD-10-CM | POA: Insufficient documentation

## 2023-06-19 DIAGNOSIS — E119 Type 2 diabetes mellitus without complications: Secondary | ICD-10-CM | POA: Insufficient documentation

## 2023-06-19 DIAGNOSIS — R002 Palpitations: Secondary | ICD-10-CM | POA: Insufficient documentation

## 2023-06-19 LAB — BASIC METABOLIC PANEL WITH GFR
Anion gap: 14 (ref 5–15)
BUN: 15 mg/dL (ref 6–20)
CO2: 25 mmol/L (ref 22–32)
Calcium: 9.1 mg/dL (ref 8.9–10.3)
Chloride: 103 mmol/L (ref 98–111)
Creatinine, Ser: 0.95 mg/dL (ref 0.61–1.24)
GFR, Estimated: >60 mL/min (ref >60)
Glucose, Bld: 104 mg/dL — ABNORMAL HIGH (ref 70–99)
Potassium: 3.7 mmol/L (ref 3.5–5.1)
Sodium: 141 mmol/L (ref 135–145)

## 2023-06-19 LAB — URINE DRUG SCREEN
Amphetamines: NOT DETECTED
Barbiturates: NOT DETECTED
Benzodiazepines: NOT DETECTED
Cocaine: NOT DETECTED
Fentanyl: NOT DETECTED
Methadone Scn, Ur: NOT DETECTED
Opiates: NOT DETECTED
Tetrahydrocannabinol: NOT DETECTED

## 2023-06-19 LAB — TSH: TSH: 1.101 u[IU]/mL (ref 0.350–4.500)

## 2023-06-19 LAB — CBC
HCT: 43.5 % (ref 39.0–52.0)
Hemoglobin: 14.8 g/dL (ref 13.0–17.0)
MCH: 30.3 pg (ref 26.0–34.0)
MCHC: 34 g/dL (ref 30.0–36.0)
MCV: 89 fL (ref 80.0–100.0)
Platelets: 151 10*3/uL (ref 150–400)
RBC: 4.89 MIL/uL (ref 4.22–5.81)
RDW: 13.7 % (ref 11.5–15.5)
WBC: 6.2 10*3/uL (ref 4.0–10.5)
nRBC: 0 % (ref 0.0–0.2)

## 2023-06-19 LAB — TROPONIN T, HIGH SENSITIVITY
Troponin T High Sensitivity: <15 ng/L (ref <19)
Troponin T High Sensitivity: <15 ng/L (ref <19)

## 2023-06-19 LAB — MAGNESIUM: Magnesium: 1.9 mg/dL (ref 1.7–2.4)

## 2023-06-19 NOTE — Discharge Instructions (Addendum)
 Follow up with your cardiologist. Limit caffeine  intake. Return to ER for worsening or concerning symptoms.

## 2023-06-19 NOTE — ED Triage Notes (Signed)
 Pt is coming in with palpitations, he went for a walk today and after the walk he started having palpitations, he does mention drinking a lot of coffee. No previous cardiac Hx to his knowledge. He does have a family Hx of cardiac issues with his dad having a pacemaker.

## 2023-06-19 NOTE — ED Notes (Signed)
 Pt can't provide UA at this time.

## 2023-06-19 NOTE — ED Provider Notes (Signed)
  Physical Exam  BP 128/83   Pulse 91   Temp 97.8 F (36.6 C)   Resp 15   Ht 5\' 11"  (1.803 m)   Wt 86.2 kg   SpO2 96%   BMI 26.50 kg/m   Physical Exam Vitals and nursing note reviewed.  Constitutional:      General: He is not in acute distress.    Appearance: Normal appearance.  HENT:     Head: Normocephalic and atraumatic.     Mouth/Throat:     Mouth: Mucous membranes are moist.     Pharynx: Oropharynx is clear.  Eyes:     Extraocular Movements: Extraocular movements intact.     Conjunctiva/sclera: Conjunctivae normal.     Pupils: Pupils are equal, round, and reactive to light.  Cardiovascular:     Rate and Rhythm: Normal rate and regular rhythm.     Pulses: Normal pulses.     Heart sounds: Normal heart sounds. No murmur heard.    No friction rub. No gallop.  Pulmonary:     Effort: Pulmonary effort is normal.     Breath sounds: Normal breath sounds.  Abdominal:     General: Abdomen is flat. Bowel sounds are normal.     Palpations: Abdomen is soft.  Musculoskeletal:        General: Normal range of motion.     Cervical back: Normal range of motion and neck supple.     Right lower leg: No edema.     Left lower leg: No edema.  Skin:    General: Skin is warm and dry.     Capillary Refill: Capillary refill takes less than 2 seconds.  Neurological:     General: No focal deficit present.     Mental Status: He is alert. Mental status is at baseline.  Psychiatric:        Mood and Affect: Mood normal.     Procedures  Procedures  ED Course / MDM    Medical Decision Making Amount and/or Complexity of Data Reviewed Labs: ordered. Radiology: ordered.   Assumed care from L. Abigail Abler, PA-C; at this time patient has extensive workup that is negative pending repeat troponin.  Plan at this time is to discharge for outpatient follow-up to primary care if repeat troponin is also negative.  Repeat troponin is negative as well, reassessed patient and he is stable with no new  exam findings.  At this time we will discharge patient as per discussion with previous provider and have the patient follow-up outpatient with his primary care/cardiologist.       Juanetta Nordmann, PA 06/19/23 Gerry Krone    Rosealee Concha, MD 06/19/23 2011

## 2023-06-19 NOTE — ED Provider Notes (Signed)
 [place] [facility] Provider Note   CSN: [id number] Arrival date & time: 06/19/23  1647     History  Chief Complaint  Patient presents with   Palpitations    Anthony Russell is a 43 y.o. male.  43 year old male presents with complaint of palpitations.  Patient states that he was having his usual day, had his usual cup of coffee, was doing work and went for a walk.  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  Patient started to feel like his heart was racing.  He did not check his heart rate, came to the emergency room, continues to have the symptoms currently.  Denies associated shortness of breath, diaphoresis, nausea.  Patient has been seen by cardiology in the past, told he has an elevated heart rate in the range of 80s to 90s.  Patient notes his brother at 1 point was ill with a pericardial effusion, father had a pacemaker placed in his 50s.  Patient has a history of hyperlipidemia, GERD, diabetes, hypertension, IBS.  He denies drug use, reports normal p.o. intake today.       Home Medications Prior to Admission medications   Medication Sig Start Date End Date Taking? Authorizing Provider  albuterol  (VENTOLIN  HFA) 108 (90 Base) MCG/ACT inhaler INHALE 2 PUFFS INTO THE LUNGS EVERY 6 HOURS AS NEEDED FOR WHEEZING OR SHORTNESS OF BREATH Patient taking differently: Inhale 2 puffs into the lungs every 6 (six) hours as needed for wheezing or shortness of breath. 02/09/21   Saguier, Gaylin Ke, PA-C  atorvastatin  (LIPITOR) 10 MG tablet TAKE 1 TABLET(10 MG) BY MOUTH DAILY Patient taking differently: Take 10 mg by mouth daily. 08/16/22   Krasowski, Robert J, MD  budesonide -formoterol  (SYMBICORT ) 160-4.5 MCG/ACT inhaler Inhale 2 puffs into the lungs in the morning and at bedtime. 06/07/22   Hunsucker, Archer Kobs, MD  Continuous Glucose Sensor (FREESTYLE  LIBRE 3 SENSOR) MISC USE SENSOR TO CHECK BLOOD GLUCOSE CONTINUOUSLY 11/06/22   Saguier, Gaylin Ke, PA-C  fluticasone  (FLONASE ) 50 MCG/ACT nasal spray Place 2 sprays into both nostrils daily. 04/25/22   Saguier, Gaylin Ke, PA-C  ibuprofen  (ADVIL ) 600 MG tablet Take 1 tablet (600 mg total) by mouth every 8 (eight) hours as needed. Patient taking differently: Take 600 mg by mouth every 8 (eight) hours as needed for mild pain or moderate pain. 05/24/22   Margaree Shark, MD  levocetirizine (XYZAL ) 5 MG tablet Take 1 tablet (5 mg total) by mouth every evening. 11/28/18   Saguier, Gaylin Ke, PA-C  metFORMIN  (GLUCOPHAGE ) 1000 MG tablet TAKE 1 TABLET(1000 MG) BY MOUTH TWICE DAILY WITH A MEAL Patient not taking: Reported on 11/09/2022 01/31/22   Saguier, Gaylin Ke, PA-C  Multiple Vitamin (MULTIVITAMIN ADULT PO) Take 2 tablets by mouth daily.    [provider]      Allergies    Patient has no known allergies.    Review of Systems   Review of Systems Negative except as per HPI Physical Exam Updated Vital Signs BP 128/83   Pulse 91   Temp 97.8 F (36.6 C)   Resp 15   Ht 5\' 11"  (1.803 m)   Wt 86.2 kg   SpO2 96%   BMI 26.50 kg/m  Physical Exam Vitals and nursing note reviewed.  Constitutional:      General: He is not in acute distress.  Appearance: He is well-developed. He is not diaphoretic.  HENT:     Head: Normocephalic and atraumatic.  Eyes:     Conjunctiva/sclera: Conjunctivae normal.  Cardiovascular:     Rate and Rhythm: Normal rate and regular rhythm.     Pulses: Normal pulses.     Heart sounds: Normal heart sounds.  Pulmonary:     Effort: Pulmonary effort is normal.     Breath sounds: Normal breath sounds.  Abdominal:     Palpations: Abdomen is soft.     Tenderness: There is no abdominal tenderness.  Musculoskeletal:     Cervical back: Neck supple.     Right lower leg: No edema.     Left lower leg: No edema.  Skin:    General: Skin is warm and dry.     Findings: No  erythema or rash.  Neurological:     Mental Status: He is alert and oriented to person, place, and time.  Psychiatric:        Behavior: Behavior normal.     ED Results / Procedures / Treatments   Labs (all labs ordered are listed, but only abnormal results are displayed) Labs Reviewed  BASIC METABOLIC PANEL WITH GFR - Abnormal; Notable for the following components:      Result Value   Glucose, Bld 104 (*)    All other components within normal limits  CBC  MAGNESIUM  TSH  URINE DRUG SCREEN  TROPONIN T, HIGH SENSITIVITY  TROPONIN T, HIGH SENSITIVITY    EKG EKG Interpretation Date/Time:  Tuesday Jun 19 2023 16:59:38 EDT Ventricular Rate:  98 PR Interval:  181 QRS Duration:  106 QT Interval:  346 QTC Calculation: 442 R Axis:   86  Text Interpretation: Sinus rhythm Confirmed by Rosealee Concha ([id number]) on 06/19/2023 6:05:59 PM  Radiology DG Chest Port 1 View Result Date: 06/19/2023 CLINICAL DATA:  Palpitations EXAM: PORTABLE CHEST 1 VIEW COMPARISON:  12/26/2021 FINDINGS: The heart size and mediastinal contours are within normal limits. Both lungs are clear. The visualized skeletal structures are unremarkable. IMPRESSION: No active disease. Electronically Signed   By: Esmeralda Hedge M.D.   On: 06/19/2023 17:52    Procedures Procedures    Medications Ordered in ED Medications - No data to display  ED Course/ Medical Decision Making/ A&P                                 Medical Decision Making Amount and/or Complexity of Data Reviewed Labs: ordered. Radiology: ordered.   This patient presents to the ED for concern of palpitations, this involves an extensive number of treatment options, and is a complaint that carries with it a high risk of complications and morbidity.  The differential diagnosis includes but not limited to thyroid  disorder, arrhythmia, metabolic/electrolyte    Co morbidities that complicate the patient evaluation  GERD, HLD, DM   Additional history  obtained:  External records from outside source obtained and reviewed including prior labs on file for comparison   Lab Tests:  I Ordered, and personally interpreted labs.  The pertinent results include: Magnesium within normal limits.  Troponin negative.  CBC within normal notes.  BMP without significant findings.  TSH sent out.  Second troponin pending at time of signout.   Imaging Studies ordered:  I ordered imaging studies including chest x-ray I independently visualized and interpreted imaging which showed no acute process I agree with the radiologist interpretation  Cardiac Monitoring: / EKG:  The patient was maintained on a cardiac monitor.  I personally viewed and interpreted the cardiac monitored which showed an underlying rhythm of: Sinus rhythm, rate 98   Problem List / ED Course / Critical interventions / Medication management  43 year old male presents with complaint of palpitations.  Patient notes palpitations while at rest, lying down working on his computer, persistent although improved by arrival in the ER.  On arrival, he is found to be in sinus rhythm with a rate in the 90s.  Workup is overall reassuring.  Care signed out to oncoming provider at change of shift pending repeat troponin.  If repeat troponin is unremarkable, patient can be followed by his cardiologist. I have reviewed the patients home medicines and have made adjustments as needed    Social Determinants of Health:  Has PCP, sees cardiology   Test / Admission - Considered:  Disposition pending at time of signout to oncoming provider.         Final Clinical Impression(s) / ED Diagnoses Final diagnoses:  Palpitations    Rx / DC Orders ED Discharge Orders     None         Erna He 06/19/23 1837    Rosealee Concha, MD 06/19/23 2010

## 2023-06-22 ENCOUNTER — Ambulatory Visit: Attending: Cardiology

## 2023-06-22 ENCOUNTER — Ambulatory Visit: Admitting: Cardiology

## 2023-06-22 ENCOUNTER — Ambulatory Visit: Attending: Cardiology | Admitting: Cardiology

## 2023-06-22 ENCOUNTER — Encounter: Payer: Self-pay | Admitting: Cardiology

## 2023-06-22 VITALS — BP 110/70 | HR 106 | Ht 70.0 in | Wt 200.0 lb

## 2023-06-22 DIAGNOSIS — R002 Palpitations: Secondary | ICD-10-CM | POA: Diagnosis not present

## 2023-06-22 DIAGNOSIS — I10 Essential (primary) hypertension: Secondary | ICD-10-CM | POA: Diagnosis not present

## 2023-06-22 DIAGNOSIS — R0789 Other chest pain: Secondary | ICD-10-CM

## 2023-06-22 DIAGNOSIS — R0609 Other forms of dyspnea: Secondary | ICD-10-CM

## 2023-06-22 DIAGNOSIS — E782 Mixed hyperlipidemia: Secondary | ICD-10-CM

## 2023-06-22 NOTE — Addendum Note (Signed)
 Addended by: Shawnee Dellen D on: 06/22/2023 04:02 PM   Modules accepted: Orders

## 2023-06-22 NOTE — Patient Instructions (Addendum)
Medication Instructions:  Your physician recommends that you continue on your current medications as directed. Please refer to the Current Medication list given to you today.  *If you need a refill on your cardiac medications before your next appointment, please call your pharmacy*   Lab Work: None Ordered If you have labs (blood work) drawn today and your tests are completely normal, you will receive your results only by: MyChart Message (if you have MyChart) OR A paper copy in the mail If you have any lab test that is abnormal or we need to change your treatment, we will call you to review the results.   Testing/Procedures:  WHY IS MY DOCTOR PRESCRIBING ZIO? The Zio system is proven and trusted by physicians to detect and diagnose irregular heart rhythms -- and has been prescribed to hundreds of thousands of patients.  The FDA has cleared the Zio system to monitor for many different kinds of irregular heart rhythms. In a study, physicians were able to reach a diagnosis 90% of the time with the Zio system1.  You can wear the Zio monitor -- a small, discreet, comfortable patch -- during your normal day-to-day activity, including while you sleep, shower, and exercise, while it records every single heartbeat for analysis.  1Barrett, P., et al. Comparison of 24 Hour Holter Monitoring Versus 14 Day Novel Adhesive Patch Electrocardiographic Monitoring. American Journal of Medicine, 2014.  ZIO VS. HOLTER MONITORING The Zio monitor can be comfortably worn for up to 14 days. Holter monitors can be worn for 24 to 48 hours, limiting the time to record any irregular heart rhythms you may have. Zio is able to capture data for the 51% of patients who have their first symptom-triggered arrhythmia after 48 hours.1  LIVE WITHOUT RESTRICTIONS The Zio ambulatory cardiac monitor is a small, unobtrusive, and water-resistant patch--you might even forget you're wearing it. The Zio monitor records and stores  every beat of your heart, whether you're sleeping, working out, or showering.     Follow-Up: At [facility], you and your health needs are our priority.  As part of our continuing mission to provide you with exceptional heart care, we have created designated Provider Care Teams.  These Care Teams include your primary Cardiologist (physician) and Advanced Practice Providers (APPs -  Physician Assistants and Nurse Practitioners) who all work together to provide you with the care you need, when you need it.  We recommend signing up for the patient portal called "MyChart".  Sign up information is provided on this After Visit Summary.  MyChart is used to connect with patients for Virtual Visits (Telemedicine).  Patients are able to view lab/test results, encounter notes, upcoming appointments, etc.  Non-urgent messages can be sent to your provider as well.   To learn more about what you can do with MyChart, go to [email or web address].    Your next appointment:   6 month(s)  The format for your next appointment:   In Person  Provider:   Robert Krasowski, MD    Other Instructions NA  

## 2023-06-22 NOTE — Progress Notes (Signed)
 Cardiology Office Note:    Date:  06/22/2023   ID:  Anthony Russell, DOB [birth year], MRN [id number]  PCP:  Sylvia Everts, PA-C  Cardiologist:  Ralene Burger, MD    Referring MD: Sylvia Everts, [place]   Chief Complaint  Patient presents with   Elevated HR    History of Present Illness:    Anthony Russell is a 43 y.o. male past medical history significant for diabetes anxiety depression essential hypertension hyperlipidemia obesity.  Couple days ago he ended going to the emergency room and the reason for visit was palpitations.  He was laying down playing with a computer started having palpitations gradual onset gradual offset.  There is no chest pain no dizziness no passing out no sweating associated with this sensation.  He went to the emergency room workup in the emergency room was unrevealing he was discharged home.  Since that time he is doing fine he did amazingly he lost significant amount of weight he is worried about diabetes he walks every single day he use high-protein food he is hemoglobin A1c is normal now.  He still uses metformin   Past Medical History:  Diagnosis Date   Allergy    Allergy, unspecified not elsewhere classified    rx w/ OTC antihistamines PRN   Anxiety    Asthma    Atypical chest pain    recent neg eval w/ normal cxr/ekg   Depression    Diverticulosis    DM (diabetes mellitus) (HCC)    Dyspepsia    on protonix  40mg /d   Esophagitis    GERD (gastroesophageal reflux disease)    on protonix  40mg /d   HTN (hypertension)    Hyperlipidemia    on diet alone   IBS (irritable bowel syndrome)    Lumbar back pain    s/p lumbar laminectomy 2007 by DrCabbell   Migraines    Overweight(278.02)    weight Jan10=246#.[name] was 225# in 9/07...diet and exercise was discussed   Sleep apnea    not wearing c-pap currently    Past Surgical History:  Procedure Laterality Date   DENTAL SURGERY     LUMBAR LAMINECTOMY  01/30/2005   DrCabbell   TEAR DUCT PROBING       Current Medications: Current Meds  Medication Sig   albuterol  (VENTOLIN  HFA) 108 (90 Base) MCG/ACT inhaler INHALE 2 PUFFS INTO THE LUNGS EVERY 6 HOURS AS NEEDED FOR WHEEZING OR SHORTNESS OF BREATH (Patient taking differently: Inhale 2 puffs into the lungs every 6 (six) hours as needed for wheezing or shortness of breath.)   atorvastatin  (LIPITOR) 10 MG tablet TAKE 1 TABLET(10 MG) BY MOUTH DAILY (Patient taking differently: Take 10 mg by mouth daily.)   budesonide -formoterol  (SYMBICORT ) 160-4.5 MCG/ACT inhaler Inhale 2 puffs into the lungs in the morning and at bedtime.   Continuous Glucose Sensor (FREESTYLE LIBRE 3 SENSOR) MISC USE SENSOR TO CHECK BLOOD GLUCOSE CONTINUOUSLY (Patient taking differently: 1 each by Other route daily. dUSE SENSOR TO CHECK BLOOD GLUCOSE CONTINUOUSLY)   fluticasone  (FLONASE ) 50 MCG/ACT nasal spray Place 2 sprays into both nostrils daily.   ibuprofen  (ADVIL ) 600 MG tablet Take 1 tablet (600 mg total) by mouth every 8 (eight) hours as needed. (Patient taking differently: Take 600 mg by mouth every 8 (eight) hours as needed for mild pain (pain score 1-3) or moderate pain (pain score 4-6).)   levocetirizine (XYZAL ) 5 MG tablet Take 1 tablet (5 mg total) by mouth every evening.   metFORMIN  (GLUCOPHAGE ) 1000 MG tablet  TAKE 1 TABLET(1000 MG) BY MOUTH TWICE DAILY WITH A MEAL (Patient taking differently: Take 1,000 mg by mouth 2 (two) times daily with a meal.)   Multiple Vitamin (MULTIVITAMIN ADULT PO) Take 2 tablets by mouth daily.     Allergies:   Patient has no known allergies.   Social History   Socioeconomic History   Marital status: Single    Spouse name: Not on file   Number of children: 0   Years of education: Not on file   Highest education level: Associate degree: academic program  Occupational History   Occupation: Systems analyst  Tobacco Use   Smoking status: Former    Current packs/day: 0.00    Average packs/day: 0.1 packs/day for 8.0 years (0.8  ttl pk-yrs)    Types: Cigarettes    Start date: 03/03/2003    Quit date: 03/03/2011    Years since quitting: 12.3   Smokeless tobacco: Never  Vaping Use   Vaping status: Never Used  Substance and Sexual Activity   Alcohol use: Yes    Comment: once or twice a month   Drug use: No   Sexual activity: Not on file  Other Topics Concern   Not on file  Social History Narrative   3 cups caffeine  a day   Social Drivers of Health   Financial Resource Strain: Low Risk  (04/02/2023)   Overall Financial Resource Strain (CARDIA)    Difficulty of Paying Living Expenses: Not very hard  Food Insecurity: No Food Insecurity (04/02/2023)   Hunger Vital Sign    Worried About Running Out of Food in the Last Year: Never true    Ran Out of Food in the Last Year: Never true  Transportation Needs: No Transportation Needs (04/02/2023)   PRAPARE - Administrator, Civil Service (Medical): No    Lack of Transportation (Non-Medical): No  Physical Activity: Insufficiently Active (04/02/2023)   Exercise Vital Sign    Days of Exercise per Week: 2 days    Minutes of Exercise per Session: 30 min  Stress: No Stress Concern Present (04/02/2023)   [employer] of Occupational Health - Occupational Stress Questionnaire    Feeling of Stress : Not at all  Social Connections: Socially Isolated (04/02/2023)   Social Connection and Isolation Panel [NHANES]    Frequency of Communication with Friends and Family: Once a week    Frequency of Social Gatherings with Friends and Family: Once a week    Attends Religious Services: Never    Database administrator or Organizations: No    Attends Engineer, structural: Not on file    Marital Status: Never married     Family History: The patient's family history includes Allergies in his brother, father, and mother; Breast cancer in his maternal aunt; Diabetes in his father and mother; Heart disease in his brother; Hyperlipidemia in his father; Irritable bowel  syndrome in his brother and mother. There is no history of Colon cancer, Esophageal cancer, Rectal cancer, or Stomach cancer. ROS:   Please see the history of present illness.    All 14 point review of systems negative except as described per history of present illness  EKGs/Labs/Other Studies Reviewed:    EKG Interpretation Date/Time:  Friday Jun 22 2023 15:42:21 EDT Ventricular Rate:  105 PR Interval:  160 QRS Duration:  90 QT Interval:  316 QTC Calculation: 417 R Axis:   88  Text Interpretation: Sinus tachycardia When compared with ECG of 19-Jun-2023 16:59, PREVIOUS  ECG IS PRESENT Confirmed by Ralene Burger [phone]) on 06/22/2023 3:44:29 PM    Recent Labs: 01/17/2023: ALT 17 06/19/2023: BUN 15; Creatinine, Ser 0.95; Hemoglobin 14.8; Magnesium 1.9; Platelets 151; Potassium 3.7; Sodium 141; TSH 1.101  Recent Lipid Panel    Component Value Date/Time   CHOL 190 01/17/2023 0725   TRIG 108.0 01/17/2023 0725   HDL 37.40 (L) 01/17/2023 0725   CHOLHDL 5 01/17/2023 0725   VLDL 21.6 01/17/2023 0725   LDLCALC 131 (H) 01/17/2023 0725   LDLDIRECT 132.0 08/14/2019 1103    Physical Exam:    VS:  BP 110/70 (BP Location: Right Arm, Patient Position: Sitting)   Pulse (!) 106   Ht 5\' 10"  (1.778 m)   Wt 200 lb (90.7 kg)   SpO2 95%   BMI 28.70 kg/m     Wt Readings from Last 3 Encounters:  06/22/23 200 lb (90.7 kg)  06/19/23 190 lb (86.2 kg)  04/02/23 200 lb (90.7 kg)     GEN:  Well nourished, well developed in no acute distress HEENT: Normal NECK: No JVD; No carotid bruits LYMPHATICS: No lymphadenopathy CARDIAC: RRR, no murmurs, no rubs, no gallops RESPIRATORY:  Clear to auscultation without rales, wheezing or rhonchi  ABDOMEN: Soft, non-tender, non-distended MUSCULOSKELETAL:  No edema; No deformity  SKIN: Warm and dry LOWER EXTREMITIES: no swelling NEUROLOGIC:  Alert and oriented x 3 PSYCHIATRIC:  Normal affect   ASSESSMENT:    1. Primary hypertension   2. Atypical  chest pain   3. Dyspnea on exertion   4. Mixed hyperlipidemia   5. Palpitations    PLAN:    In order of problems listed above:  Essential hypertension blood pressure well-controlled continue present management. Palpitations asking to wear Zio patch for 2 weeks to see if he got any significant arrhythmia.  I did review record from the emergency room for this visit. Dyslipidemia I will make arrangements for him to have fasting [place] for redone.  Since he is diabetic even though his diabetes is excellently controlled he can benefit from statin.  He is taking Lipitor 10 which we will continue again we will recheck his fasting lipid profile   Medication Adjustments/Labs and Tests Ordered: Current medicines are reviewed at length with the patient today.  Concerns regarding medicines are outlined above.  Orders Placed This Encounter  Procedures   EKG 12-Lead   Medication changes: No orders of the defined types were placed in this encounter.   Signed, Manfred Seed, MD, [facility] 06/22/2023 3:57 PM    [facility]

## 2023-07-03 ENCOUNTER — Ambulatory Visit: Admitting: Internal Medicine

## 2023-07-03 ENCOUNTER — Encounter: Payer: Self-pay | Admitting: Internal Medicine

## 2023-07-03 VITALS — BP 113/68 | HR 82 | Ht 71.0 in | Wt 199.0 lb

## 2023-07-03 DIAGNOSIS — J301 Allergic rhinitis due to pollen: Secondary | ICD-10-CM | POA: Diagnosis not present

## 2023-07-03 DIAGNOSIS — J4520 Mild intermittent asthma, uncomplicated: Secondary | ICD-10-CM | POA: Diagnosis not present

## 2023-07-03 MED ORDER — FLUTICASONE PROPIONATE 50 MCG/ACT NA SUSP: 2.0000 | Freq: Every day | NASAL | 5 refills | Status: AC

## 2023-07-03 MED ORDER — BUDESONIDE-FORMOTEROL FUMARATE 160-4.5 MCG/ACT IN AERO: 2.0000 | INHALATION_SPRAY | Freq: Two times a day (BID) | RESPIRATORY_TRACT | 11 refills | Status: AC

## 2023-07-03 NOTE — Progress Notes (Signed)
 Anthony Russell    [id number]    [birth year]  Primary Care Physician:Saguier, Slater Duncan Date of Appointment: 07/03/2023 Established Patient Visit  Chief complaint:   Chief Complaint  Patient presents with   Acute Visit    Pt state SOB , has not used either  INH since 2024      HPI: Anthony Russell is a 43 y.o. man with mild intermittent asthma on intermittent ICS with symbicort . Follows with Dr Marygrace Snellen.   Interval Updates: Here for acute visit. Did well the last year. No issues with asthma, no exacerbations.   A couple weeks ago had an episode of heart racing after walking outside. He went to the ER and was found to be in sinus tachycardia.   He has noticed a little bit of chest tightness, central chest pressure around the same time. Mild cough. No fevers, chills, night sweats.   He is having some seasonal allergic rhinitis and nasal congestion. Taking xyzal  daily but without complete relief.   Asthma is usually triggered by allergies.   His symbicort  is expired so he needs a new one.     ACT:  Asthma Control Test ACT Total Score  07/03/2023  8:31 AM 21  06/07/2022  1:46 PM 23   FeNO: Serum Eos/IgE:   I have reviewed the patient's family social and past medical history and updated as appropriate.   Past Medical History:  Diagnosis Date   Allergy    Allergy, unspecified not elsewhere classified    rx w/ OTC antihistamines PRN   Anxiety    Asthma    Atypical chest pain    recent neg eval w/ normal cxr/ekg   Depression    Diverticulosis    DM (diabetes mellitus) (HCC)    Dyspepsia    on protonix  40mg /d   Esophagitis    GERD (gastroesophageal reflux disease)    on protonix  40mg /d   HTN (hypertension)    Hyperlipidemia    on diet alone   IBS (irritable bowel syndrome)    Lumbar back pain    s/p lumbar laminectomy 2007 by DrCabbell   Migraines    Overweight(278.02)    weight Jan10=246#.[name] was 225# in 9/07...diet and exercise was discussed    Sleep apnea    not wearing c-pap currently    Past Surgical History:  Procedure Laterality Date   DENTAL SURGERY     LUMBAR LAMINECTOMY  01/30/2005   DrCabbell   TEAR DUCT PROBING      Family History  Problem Relation Age of Onset   Allergies Mother    Irritable bowel syndrome Mother    Diabetes Mother    Allergies Father    Diabetes Father    Hyperlipidemia Father    Irritable bowel syndrome Brother    Heart disease Brother    Allergies Brother    Breast cancer Maternal Aunt    Colon cancer Neg Hx    Esophageal cancer Neg Hx    Rectal cancer Neg Hx    Stomach cancer Neg Hx     Social History   Occupational History   Occupation: Systems analyst  Tobacco Use   Smoking status: Former    Current packs/day: 0.00    Average packs/day: 0.1 packs/day for 8.0 years (0.8 ttl pk-yrs)    Types: Cigarettes    Start date: 03/03/2003    Quit date: 03/03/2011    Years since quitting: 12.3   Smokeless tobacco: Never  Vaping Use   Vaping status: Never Used  Substance and Sexual Activity   Alcohol use: Yes    Comment: once or twice a month   Drug use: No   Sexual activity: Not on file     Physical Exam: Blood pressure 113/68, pulse 82, height 5\' 11"  (1.803 m), weight 199 lb (90.3 kg), SpO2 99%.  Gen:      No acute distress ENT:  no nasal polyps, mucus membranes moist Lungs:    No increased respiratory effort, symmetric chest wall excursion, clear to auscultation bilaterally, no wheezes or crackles CV:         Regular rate and rhythm; no murmurs, rubs, or gallops.  No pedal edema   Data Reviewed: Imaging: I have personally reviewed the chest xray May 2025 - low lung volumes, no acute cardiopulmonary process  PFTs:     Latest Ref Rng & Units 06/08/2021   11:48 AM  PFT Results  FVC-Pre L 4.33   FVC-Predicted Pre % 84   FVC-Post L 3.82   FVC-Predicted Post % 74   Pre FEV1/FVC % % 66   Post FEV1/FCV % % 73   FEV1-Pre L 2.85   FEV1-Predicted Pre % 69   FEV1-Post  L 2.79   DLCO uncorrected ml/min/mmHg 37.34   DLCO UNC% % 123   DLCO corrected ml/min/mmHg 37.34   DLCO COR %Predicted % 123   DLVA Predicted % 141   TLC L 5.88   TLC % Predicted % 87   RV % Predicted % 95    I have personally reviewed the patient's PFTs and normal pulmonary function, elevated DLCO.  Labs: Lab Results  Component Value Date   WBC 6.2 06/19/2023   HGB 14.8 06/19/2023   HCT 43.5 06/19/2023   MCV 89.0 06/19/2023   PLT 151 06/19/2023   Lab Results  Component Value Date   NA 141 06/19/2023   K 3.7 06/19/2023   CO2 25 06/19/2023   GLUCOSE 104 (H) 06/19/2023   BUN 15 06/19/2023   CREATININE 0.95 06/19/2023   CALCIUM  9.1 06/19/2023   GFR 101.38 01/17/2023   EGFR 113 03/10/2021   GFRNONAA >60 06/19/2023    Immunization status: Immunization History  Administered Date(s) Administered   DTP 11/12/1980, 01/18/1981   Hepatitis B 10/12/1995, 11/15/1995, 05/02/1996   Influenza, Seasonal, Injecte, Preservative Fre 11/09/2022   Influenza,inj,Quad PF,6+ Mos 11/16/2020   MMR 12/03/1981, 11/15/1995   PFIZER(Purple Top)SARS-COV-2 Vaccination 04/26/2019, 05/18/2019, 01/01/2020   Pneumococcal Polysaccharide-23 04/05/2020   Tdap 08/13/2012, 08/15/2022    External Records Personally Reviewed: pulmonary  Assessment:  Mild intermittent asthma, not well controlled Seasonal allergic rhinitis, not well controlled  Plan/Recommendations:  I suspect your asthma is not well-controlled due to seasonal allergies.  For your allergies continue the Xyzal .  Resume Flonase  nasal spray.  For your asthma, resume Symbicort  with spacer, 2 puffs twice daily.  Gargling with an alcohol-based mouthwash can help reduce the risk for thrush.  Using a spacer will also help with this.  With time and improved control you may be able to back off the dose of Symbicort  to 1 puff twice daily, or even 1 puff once a day.   Return to Care: Return in about 3 months (around 10/03/2023). With Dr  Marygrace Snellen.   Louie Rover, MD Pulmonary and Critical Care Medicine [facility] [phone]

## 2023-07-03 NOTE — Patient Instructions (Addendum)
 It was a pleasure to see you today!  Please schedule follow up with Dr Marygrace Snellen in 3 months.  If my schedule is not open yet, we will contact you with a reminder closer to that time. Please call [phone] if you haven't heard from us  a month before, and always call us  sooner if issues or concerns arise. You can also send us  a message through MyChart, but but aware that this is not to be used for urgent issues and it may take up to 5-7 days to receive a reply. Please be aware that you will likely be able to view your results before I have a chance to respond to them. Please give us  5 business days to respond to any non-urgent results.    I suspect your asthma is not well-controlled due to seasonal allergies.  For your allergies continue the Xyzal .  Resume Flonase  nasal spray.  For your asthma, resume Symbicort  with spacer, 2 puffs twice daily.  Gargling with an alcohol-based mouthwash can help reduce the risk for thrush.  Using a spacer will also help with this.  With time and improved control you may be able to back off the dose of Symbicort  to 1 puff twice daily, or even 1 puff once a day.  Flonase  - 1 spray on each side of your nose twice a day for first week, then 1 spray on each side.   Instructions for use: If you also use a saline nasal spray or rinse, use that first. Position the head with the chin slightly tucked. Use the right hand to spray into the left nostril and the right hand to spray into the left nostril.   Point the bottle away from the septum of your nose (cartilage that divides the two sides of your nose).  Hold the nostril closed on the opposite side from where you will spray Spray once and gently sniff to pull the medicine into the higher parts of your nose.  Don't sniff too hard as the medicine will drain down the back of your throat instead. Repeat with a second spray on the same side if prescribed. Repeat on the other side of your nose.

## 2023-07-03 NOTE — Progress Notes (Signed)
 The patient has been prescribed the inhaler symbicort with spacer. Inhaler technique was demonstrated to patient. The patient subsequently demonstrated correct technique.

## 2023-07-10 ENCOUNTER — Ambulatory Visit (INDEPENDENT_AMBULATORY_CARE_PROVIDER_SITE_OTHER): Admitting: Family Medicine

## 2023-07-10 ENCOUNTER — Encounter: Payer: Self-pay | Admitting: Family Medicine

## 2023-07-10 VITALS — BP 101/73 | HR 82 | Temp 98.2°F | Ht 71.0 in | Wt 198.0 lb

## 2023-07-10 DIAGNOSIS — J309 Allergic rhinitis, unspecified: Secondary | ICD-10-CM | POA: Diagnosis not present

## 2023-07-10 DIAGNOSIS — J392 Other diseases of pharynx: Secondary | ICD-10-CM

## 2023-07-10 MED ORDER — AZELASTINE HCL 0.1 % NA SOLN: 2.0000 | Freq: Two times a day (BID) | NASAL | 11 refills | Status: AC

## 2023-07-10 NOTE — Progress Notes (Signed)
 Acute Office Visit  Subjective:     Patient ID: Anthony Russell, male    DOB: Jun 06, 1980, 43 y.o.   MRN: [id number]  No chief complaint on file.   HPI Patient is in today for itchy throat.  Discussed the use of AI scribe software for clinical note transcription with the patient, who gave verbal consent to proceed.  History of Present Illness Anthony Russell is a 43 year old male with seasonal allergies and acid reflux who presents with throat irritation.  He has been experiencing throat irritation for the past week, described as a "scratchy and itchy"' sensation occurring only when swallowing. The irritation is worse in the morning upon waking and eases off throughout the day, but returns the next morning. He notes the onset of symptoms following a chiropractic adjustment, which he speculates may have affected his sinuses. No fever, significant gland swelling, or sore throat. He denies ear pain, headaches, chest pain, or difficulty breathing.  He has a history of seasonal allergies, primarily in the spring and summer, and has recently been prescribed Flonase  and Symbicort  by a pulmonologist. He restarted Xyzal  this spring after a period of not taking it. He experiences sneezing, itchy eyes, nasal drainage, and a tendency to clear his throat, which he tries to minimize.  He also has a history of acid reflux and is prescribed Protonix , although he has not been taking it recently.      ROS All review of systems negative except what is listed in the HPI      Objective:    BP 101/73   Pulse 82   Temp 98.2 F (36.8 C) (Oral)   Ht 5\' 11"  (1.803 m)   Wt 198 lb (89.8 kg)   SpO2 99%   BMI 27.62 kg/m    Physical Exam Vitals reviewed.  Constitutional:      Appearance: Normal appearance.  HENT:     Head: Normocephalic and atraumatic.     Right Ear: Tympanic membrane normal.     Left Ear: Tympanic membrane normal.     Ears:     Comments: Mildly erythematous canals (used  Qtips prior to visit)    Nose: No congestion or rhinorrhea.     Mouth/Throat:     Lips: Pink.     Mouth: Mucous membranes are moist.     Pharynx: Oropharynx is clear. Postnasal drip present. No pharyngeal swelling, oropharyngeal exudate or posterior oropharyngeal erythema.     Tonsils: No tonsillar exudate or tonsillar abscesses.  Cardiovascular:     Rate and Rhythm: Normal rate and regular rhythm.  Pulmonary:     Effort: Pulmonary effort is normal.     Breath sounds: Normal breath sounds.  Neurological:     Mental Status: He is alert and oriented to person, place, and time.  Psychiatric:        Mood and Affect: Mood normal.        Thought Content: Thought content normal.        Judgment: Judgment normal.     No results found for any visits on 07/10/23.      Assessment & Plan:   Problem List Items Addressed This Visit       Active Problems   Allergic rhinitis - Primary   Relevant Medications   azelastine  (ASTELIN ) 0.1 % nasal spray   Other Visit Diagnoses       Throat irritation           Assessment & Plan  Throat irritation Possible allergic etiology or silent reflux.  - Prescribed Astelin  nasal spray twice daily with Flonase . - Restarted Protonix  (already has at home) for possible silent reflux. - Encouraged increased fluid intake, honey and lemon, and cough drops. - Advised against throat clearing. - Instructed to report worsening symptoms.  Allergic rhinitis Springtime allergies with sneezing, itchy eyes, and nasal drainage. Discussed antihistamine rotation if symptoms persist. - Continue Flonase , Symbicort , and Xyzal . - Consider Zyrtec or Claritin if Xyzal  used over three months. - Recommend local honey from February to [facility] Day.    Meds ordered this encounter  Medications   azelastine  (ASTELIN ) 0.1 % nasal spray    Sig: Place 2 sprays into both nostrils 2 (two) times daily. Use in each nostril as directed    Dispense:  30 mL    Refill:  11     Use generic Astelin     Supervising Provider:   Randie Bustle A [[id number]]    Return if symptoms worsen or fail to improve.  Anthony Hock, NP

## 2023-07-11 DIAGNOSIS — R002 Palpitations: Secondary | ICD-10-CM | POA: Diagnosis not present

## 2023-07-30 DIAGNOSIS — R002 Palpitations: Secondary | ICD-10-CM | POA: Diagnosis not present

## 2023-07-31 ENCOUNTER — Ambulatory Visit: Payer: Self-pay | Admitting: Cardiology

## 2023-08-21 ENCOUNTER — Telehealth: Payer: Self-pay

## 2023-08-21 NOTE — Telephone Encounter (Signed)
Left message on My Chart with monitor results per Dr. Vanetta Shawl note. Routed to PCP.

## 2023-08-31 ENCOUNTER — Ambulatory Visit: Attending: Internal Medicine | Admitting: Internal Medicine

## 2023-08-31 ENCOUNTER — Encounter: Payer: Self-pay | Admitting: Internal Medicine

## 2023-08-31 VITALS — BP 114/73 | HR 90 | Resp 14 | Ht 70.0 in | Wt 201.0 lb

## 2023-08-31 DIAGNOSIS — M7661 Achilles tendinitis, right leg: Secondary | ICD-10-CM

## 2023-08-31 DIAGNOSIS — M5416 Radiculopathy, lumbar region: Secondary | ICD-10-CM | POA: Diagnosis not present

## 2023-08-31 DIAGNOSIS — R29898 Other symptoms and signs involving the musculoskeletal system: Secondary | ICD-10-CM

## 2023-08-31 NOTE — Patient Instructions (Addendum)
 Hip Exercises Ask your health care provider which exercises are safe for you. Do exercises exactly as told by your provider and adjust them as told. It is normal to feel mild stretching, pulling, tightness, or discomfort as you do these exercises. Stop right away if you feel sudden pain or your pain gets worse. Do not begin these exercises until told by your provider. Stretching and range-of-motion exercises These exercises warm up your muscles and joints and improve the movement and flexibility of your hip. They also help to relieve pain, numbness, and tingling. You may be asked to limit your range of motion if you had a hip replacement. Talk to your provider about these limits. Hip rotation  Lie on your back on a firm surface. With your left / right hand, gently pull your left / right knee toward the shoulder that is on the same side of the body. Stop when your knee is pointing toward the ceiling. Hold your left / right ankle with your other hand. Keeping your knee steady, gently pull your left / right ankle toward your other shoulder until you feel a stretch in your butt. Keep your hips and shoulders firmly planted while you do this stretch. Hold this position for __________ seconds. Repeat __________ times. Complete this exercise __________ times a day. Seated stretch This exercise is sometimes called hamstrings and adductors stretch. Sit on the floor with your legs stretched wide. Keep your knees straight during this exercise. Keeping your head and back in a straight line, bend at your waist to reach for your left foot (position A). You should feel a stretch in your right inner thigh (adductors). Hold this position for __________ seconds. Then slowly return to the upright position. Keeping your head and back in a straight line, bend at your waist to reach forward (position B). You should feel a stretch behind both of your thighs and knees (hamstrings). Hold this position for __________  seconds. Then slowly return to the upright position. Keeping your head and back in a straight line, bend at your waist to reach for your right foot (position C). You should feel a stretch in your left inner thigh (adductors). Hold this position for __________ seconds. Then slowly return to the upright position. Repeat __________ times. Complete this exercise __________ times a day. Lunge This exercise stretches the muscles of the hip (hip flexors). Place your left / right knee on the floor and bend your other knee so that is directly over your ankle. You should be half-kneeling. Keep good posture with your head over your shoulders. Tighten your butt muscles to point your tailbone downward. This will prevent your back from arching too much. You should feel a gentle stretch in the front of your left / right thigh and hip. If you do not feel a stretch, slide your other foot forward slightly and then slowly lunge forward with your chest up until your knee once again lines up over your ankle. Make sure your tailbone continues to point downward. Hold this position for __________ seconds. Slowly return to the starting position. Repeat __________ times. Complete this exercise __________ times a day. Strengthening exercises These exercises build strength and endurance in your hip. Endurance is the ability to use your muscles for a long time, even after they get tired. Bridge This exercise strengthens the muscles of your hip (hip extensors). Lie on your back on a firm surface with your knees bent and your feet flat on the floor. Tighten your butt muscles  and lift your bottom off the floor until the trunk of your body and your hips are level with your thighs. Do not arch your back. You should feel the muscles working in your butt and the back of your thighs. If you do not feel these muscles, slide your feet 1-2 inches (2.5-5 cm) farther away from your butt. Hold this position for __________  seconds. Slowly lower your hips to the starting position. Let your muscles relax completely between repetitions. Repeat __________ times. Complete this exercise __________ times a day. Straight leg raises, side-lying This exercise strengthens the muscles that move the hip joint away from the center of the body (hip abductors). Lie on your side with your left / right leg in the top position. Lie so your head, shoulder, hip, and knee line up. You may bend your bottom knee slightly to help you balance. Roll your hips slightly forward, so your hips are stacked directly over each other and your left / right knee is facing forward. Leading with your heel, lift your top leg 4-6 inches (10-15 cm). You should feel the muscles in your top hip lifting. Do not let your foot drift forward. Do not let your knee roll toward the ceiling. Hold this position for __________ seconds. Slowly return to the starting position. Let your muscles relax completely between repetitions. Repeat __________ times. Complete this exercise __________ times a day. Squats This exercise strengthens the muscles in the front of your thigh (quadriceps). Stand in front of a table, or stand in a doorframe so your feet and knees are in line with the frame. You may place your hands on the table or frame for balance. Slowly bend your knees and lower your hips like you are going to sit in a chair. Keep your lower legs in a straight up-and-down position. Do not let your hips go lower than your knees. Do not bend your knees lower than told by your provider. If your hip pain increases, do not bend as low. Hold this position for ___________ seconds. Slowly push with your legs to return to standing. Do not use your hands to pull yourself to standing. Repeat __________ times. Complete this exercise __________ times a day. This information is not intended to replace advice given to you by your health care provider. Make sure you discuss any  questions you have with your health care provider.

## 2023-08-31 NOTE — Progress Notes (Signed)
 Office Visit Note  Patient: Anthony Russell             Date of Birth: [birth year]           MRN: [id number]             PCP: Dorina Dallas RIGGERS Referring: Dorina Dallas RIGGERS Visit Date: 08/31/2023 Occupation: Software programmer  Subjective:  New Patient (Initial Visit) (Patient states he has some joint stiffness in his lower back. )   Discussed the use of AI scribe software for clinical note transcription with the patient, who gave verbal consent to proceed.  History of Present Illness   MARQUICE UDDIN is a 43 year old male here for evaluation of joint pain and stiffness in multiple areas particularly with decrease in cervical and lumbar spine mobility.  He has known multiple posterior segment fusions following previous laminectomy and also ossification of posterior longitudinal ligament in the cervical spine with associated stenosis.  He experiences stiffness and tightness primarily in his lower back, shoulders, and hips. The sensation is described as a limitation in movement rather than pain, with a feeling of not being able to move past a certain range. This stiffness has been ongoing for years.  He has a history of a laminectomy in 2007 due to herniated discs in his lower back. He has tried various treatments, including injections, which provided temporary relief. Physical therapy has improved his ability to crouch and perform daily activities, such as petting his cats and picking up items from the floor.  He finished formal PT sessions several months ago and reports inconsistent adherence to home exercises.  However, he still experiences tightness and difficulty in movement.  He does not take any regular medications for pain or inflammation but occasionally uses Advil . No swelling, rashes, or back pain that wakes him at night. 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  His family history includes his father having mobility issues, which he attributes to smoking. He quit smoking in February 2014. He has been working on improving his physical condition and has recently joined a gym to continue his exercises indoors due to the heat outside.    Labs reviewed 03/2023 ANA negative RF negative HLA-B27 negative Sed rate within normal limits CRP within normal limits   Imaging reviewed 03/21/23 Xray lumbar spine IMPRESSION: 1. Substantial lumbar spondylosis and degenerative disc disease. 2. Posterior element fusion at L4-L5-S1.  02/01/21 CTA Head and Neck w and w/o contrast Skeleton: Ossification of the posterior longitudinal ligament at C2-3, C3-4 and C4-5 with spinal stenosis.  05/29/20 MRI lumbar spine IMPRESSION: 1. 9 x 11 mm posterior osteophytic spurring versus complex synovial cyst at the posterior margin of the right L3-4 facet with associated reactive edema. Finding could serve as a source for lower back pain. Further evaluation with dedicated CT may be helpful for further characterization as clinically desired. 2. No acute fracture or other abnormality within the lumbar spine. 3. Small right foraminal to extraforaminal disc protrusions at L2-3 and L3-4, closely approximating and potentially irritating the exiting right L2 and L3 nerve roots respectively. 4. Left eccentric disc bulge with facet hypertrophy at L5-S1 with resultant moderate left foraminal and left lateral recess stenosis.  Activities of Daily Living:  Patient reports morning stiffness for 2 minutes.   Patient Denies nocturnal pain.  Difficulty dressing/grooming: Denies Difficulty climbing stairs: Denies Difficulty getting out of chair:  Denies Difficulty using hands for taps, buttons, cutlery, and/or writing: Denies  Review of Systems  Constitutional:  Negative for fatigue.  HENT:  Negative for mouth sores and mouth dryness.   Eyes:  Positive for dryness.   Respiratory:  Negative for shortness of breath.   Cardiovascular:  Negative for chest pain and palpitations.  Gastrointestinal:  Negative for blood in stool, constipation and diarrhea.  Endocrine: Negative for increased urination.  Genitourinary:  Negative for involuntary urination.  Musculoskeletal:  Positive for joint pain, joint pain, myalgias, morning stiffness, muscle tenderness and myalgias. Negative for gait problem, joint swelling and muscle weakness.  Skin:  Negative for color change, rash, hair loss and sensitivity to sunlight.  Allergic/Immunologic: Negative for susceptible to infections.  Neurological:  Positive for headaches. Negative for dizziness.  Hematological:  Negative for swollen glands.  Psychiatric/Behavioral:  Positive for depressed mood. Negative for sleep disturbance. The patient is nervous/anxious.     PMFS History:  Patient Active Problem List   Diagnosis Date Noted   Hip tightness 08/31/2023   Palpitations 06/22/2023   Lumbar radiculopathy 05/10/2022   Capsulitis of left shoulder 05/10/2022   Sinus tachycardia 12/28/2021   Impingement of knee joint, right 12/20/2021   Achilles tendinitis, right leg 12/20/2021   Rotator cuff tendinitis, right 11/04/2021   Encounter for screening for other metabolic disorders 04/07/2021   Dyspnea on exertion 04/07/2021   Migraines 02/23/2021   Lumbar back pain 02/23/2021   HTN (hypertension) 02/23/2021   Dyspepsia 02/23/2021   DM (diabetes mellitus) (HCC) 02/23/2021   Nasal septal deviation 02/17/2021   Laryngitis 01/19/2021   Asymmetric tonsils 12/01/2020   Subacromial bursitis of right shoulder joint 05/17/2020   Hypersomnia 11/03/2015   Obesity 11/03/2015   Allergic rhinitis 04/15/2014   Asthma in adult 04/15/2014   GERD (gastroesophageal reflux disease) 04/15/2014   IBS (irritable bowel syndrome) 04/01/2013   Nausea alone 01/15/2013   Diarrhea 01/15/2013   Depression 10/15/2012   Lesion of eyebrow 02/09/2012    Gastroparesis 07/05/2011   Anxiety 07/05/2010   TESTOSTERONE  DEFICIENCY 05/26/2008   Hyperlipidemia 02/06/2008   Facet arthropathy, lumbar 02/06/2008   Atypical chest pain 02/06/2008    Past Medical History:  Diagnosis Date   Allergy    Allergy, unspecified not elsewhere classified    rx w/ OTC antihistamines PRN   Anxiety    Asthma    Atypical chest pain    recent neg eval w/ normal cxr/ekg   Depression    Diverticulosis    DM (diabetes mellitus) (HCC)    Dyspepsia    on protonix  40mg /d   Esophagitis    GERD (gastroesophageal reflux disease)    on protonix  40mg /d   HTN (hypertension)    Hyperlipidemia    on diet alone   IBS (irritable bowel syndrome)    Lumbar back pain    s/p lumbar laminectomy 2007 by DrCabbell   Migraines    Overweight(278.02)    weight Jan10=246#.SABRA.he was 225# in 9/07...diet and exercise was discussed   Sleep apnea    not wearing c-pap currently    Family History  Problem Relation Age of Onset   Allergies Mother    Irritable bowel syndrome Mother    Diabetes Mother    Allergies Father    Diabetes Father    Hyperlipidemia Father    Irritable bowel syndrome Brother    Heart disease Brother    Allergies Brother    Breast cancer Maternal Aunt    Colon cancer Neg Hx  Esophageal cancer Neg Hx    Rectal cancer Neg Hx    Stomach cancer Neg Hx    Past Surgical History:  Procedure Laterality Date   DENTAL SURGERY     LUMBAR LAMINECTOMY  01/30/2005   DrCabbell   TEAR DUCT PROBING     Social History   Social History Narrative   3 cups caffeine  a day   Immunization History  Administered Date(s) Administered   DTP 11/12/1980, 01/18/1981   Hepatitis B 10/12/1995, 11/15/1995, 05/02/1996   Influenza, Seasonal, Injecte, Preservative Fre 11/09/2022   Influenza,inj,Quad PF,6+ Mos 11/16/2020   MMR 12/03/1981, 11/15/1995   PFIZER(Purple Top)SARS-COV-2 Vaccination 04/26/2019, 05/18/2019, 01/01/2020   Pneumococcal Polysaccharide-23  04/05/2020   Tdap 08/13/2012, 08/15/2022     Objective: Vital Signs: BP 114/73 (BP Location: Right Arm, Patient Position: Sitting, Cuff Size: Normal)   Pulse 90   Resp 14   Ht 5' 10 (1.778 m)   Wt 201 lb (91.2 kg)   BMI 28.84 kg/m    Physical Exam Eyes:     Conjunctiva/sclera: Conjunctivae normal.  Cardiovascular:     Rate and Rhythm: Normal rate and regular rhythm.  Pulmonary:     Effort: Pulmonary effort is normal.     Breath sounds: Normal breath sounds.  Musculoskeletal:     Right lower leg: No edema.     Left lower leg: No edema.  Lymphadenopathy:     Cervical: No cervical adenopathy.  Skin:    General: Skin is warm and dry.     Findings: No rash.  Neurological:     Mental Status: He is alert.  Psychiatric:        Mood and Affect: Mood normal.      Musculoskeletal Exam:  Neck decreased in flexion and extension range of motion lateral movement is preserved, no focal tenderness to pressure  Shoulders full ROM no tenderness or swelling Elbows full ROM no tenderness or swelling Wrists full ROM no tenderness or swelling Fingers full ROM no tenderness or swelling Mild scoliosis present in thoracic and lumbar spine Loss of normal lumbar lordosis Lumbar spine excursion limited at about 2 to 3 cm on modified Schober's Hips tightly limited in internal/external rotation bilaterally with soft endpoints Knees full ROM no tenderness or swelling  Investigation: No additional findings.  Imaging: No results found.  Recent Labs: Lab Results  Component Value Date   WBC 6.2 06/19/2023   HGB 14.8 06/19/2023   PLT 151 06/19/2023   NA 141 06/19/2023   K 3.7 06/19/2023   CL 103 06/19/2023   CO2 25 06/19/2023   GLUCOSE 104 (H) 06/19/2023   BUN 15 06/19/2023   CREATININE 0.95 06/19/2023   BILITOT 0.6 01/17/2023   ALKPHOS 51 01/17/2023   AST 15 01/17/2023   ALT 17 01/17/2023   PROT 6.9 01/17/2023   ALBUMIN 4.6 01/17/2023   CALCIUM  9.1 06/19/2023   GFRAA >60  03/29/2018    Speciality Comments: No specialty comments available.  Procedures:  No procedures performed Allergies: Patient has no known allergies.   Assessment / Plan:     Visit Diagnoses: Lumbar radiculopathy Ossification of posterior longitudunal ligament Lumbar degenerative disc disease with post-laminectomy changes and acquired lumbar scoliosis Chronic lumbar degenerative disc disease with post-laminectomy changes and acquired lumbar scoliosis.  But he also has calcifications in the cervical spine without proceeding surgical intervention.  There is possible association with OPLL and ankylosing spondylitis but no particular evidence of inflammatory activity on previous MRI and was negative lab workup  and x-rays from earlier this year.  I be more suspicious for process such as DISH from the overall picture so far that may progress more over time.  Fortunately he is already stopped smoking for years which would be the most important first step.  He does not have a lot of daily pain complaint and does not really meet inflammatory back pain ASAS criteria. If symptoms are progressing particularly with more pronounced back pain and stiffness, synovitis or high inflammatory markers, or radiographic progression could benefit additional workup possibly MRI including SI joints. - Continue physical therapy exercises focusing on flexibility and mobility. - Use over-the-counter NSAIDs like ibuprofen  or naproxen as needed for symptom relief. - Consider anti-inflammatory supplements such as turmeric and omega-3 if NSAIDs are not preferred. - Avoid smoking to reduce risk of progression.  Achilles tendinitis, right leg No particular focal tenderness or swelling on exam to indicate synovitis or enthesitis at this time.  Hip tightness Bilateral hip and pelvic girdle stiffness with limited internal rotation, likely multifactorial with chronic sitting and lack of mobility also with restriction from posterior  spinal elements. Emphasis on improving flexibility to compensate for lumbar spine limitations. - Perform daily flexibility exercises targeting hip internal rotation, hamstrings, and iliopsoas. - Provided range of motion exercises in visit summary discussed importance of consistency over time      Follow-Up Instructions: Return if symptoms worsen or fail to improve.   Lonni LELON Ester, MD  Note - This record has been created using [employer].  Chart creation errors have been sought, but may not always  have been located. Such creation errors do not reflect on  the standard of medical care.

## 2023-09-11 DIAGNOSIS — F4323 Adjustment disorder with mixed anxiety and depressed mood: Secondary | ICD-10-CM | POA: Diagnosis not present

## 2023-09-20 ENCOUNTER — Ambulatory Visit (INDEPENDENT_AMBULATORY_CARE_PROVIDER_SITE_OTHER): Admitting: Pulmonary Disease

## 2023-09-20 ENCOUNTER — Encounter: Payer: Self-pay | Admitting: Pulmonary Disease

## 2023-09-20 VITALS — BP 131/79 | HR 106 | Temp 98.2°F | Ht 70.0 in | Wt 208.2 lb

## 2023-09-20 DIAGNOSIS — J4520 Mild intermittent asthma, uncomplicated: Secondary | ICD-10-CM

## 2023-09-20 NOTE — Progress Notes (Signed)
 @Patient  ID: Anthony Russell, male    DOB: 24-Jan-1981, 43 y.o.   MRN: [id number]  Chief Complaint  Patient presents with   Follow-up    Asthma f/u Pt states he is doing better since last visit.     Referring provider: Dorina Dallas RIGGERS  HPI:   43 y.o. whom we are seeing in follow-up for asthma.  Most recent PCP note reviewed.   Breathing doing well.  Has maintained significant weight loss.  I think this really has helped his breathing.  Likely decreased non-Th2 inflammation associated with fat cells.  This probably helped his breathing.  Using Symbicort  as needed.  Infrequently.  Really off medicines.  Doing quite well.  HPI at initial visit: No real change in symptoms since last visit.  Had EGD.  This showed gastritis.  Change of Symbicort  to Advair  without improvement in symptoms.  Chief complaint is abdominal discomfort.  Has some heaviness or tightness in his chest when he walks sometimes.  He has a history of asthma.  He has not improved with Advair .  Trial of prednisone  20 mg daily for 5 days and this did not improve symptoms either.  Discussed at length given description of symptoms, tenderness to palpation on exam, that this is unlikely to represent an abnormality in the lung.  Suspect this is related to GI tract.  HPI at initial visit: Notes onset of upper belly pain, low chest pain several months ago.  Seen by GI.  Increasing GERD medications without improvement.  CT abdomen pelvis ordered which was most notable for esophagitis.  Tried sulcal fate without improvement but did increase his sugars.  Unfortunate, developed epiglottitis and upper airway infection 12/2020.  Got dose dexamethasone .  Improved swelling.  Antibiotics prescribed also help.  Since then he describes a chest pressure discomfort.  Points, substernal.  Worse when he lies supine.  No exertional component.  No dyspnea.  Little bit worse when sitting up compared to standing up.  No other alleviating or exacerbating  factors, positional changes, time of day, seasonal or environmental factors he can apply to make things better or worse.  He continues on high-dose Symbicort .  This does not help at all.  Seems to make things worse.  Albuterol  provides some mild relief over the course of a couple of hours.  Review chest x-ray 02/11/2021 that on my review interpretation reveals clear lungs bilaterally.  CTA PE protocol from same date reviewed which shows clear lungs, no abnormality on my review and interpretation.  PMH: Hypertension, asthma, diabetes, hyperlipidemia, headaches, sleep apnea Surgical history: Neck surgery 2007  family history: Allergies, CAD in first relatives Social history: Former smoker, quit 2014, 1 pack year history, lives in [place] / Pulmonary Flowsheets:   ACT:  Asthma Control Test ACT Total Score  09/20/2023  2:05 PM 25  07/03/2023  8:31 AM 21  06/07/2022  1:46 PM 23    MMRC:     No data to display          Epworth:     11/03/2015    3:00 PM  Results of the Epworth flowsheet  Sitting and reading 2  Watching TV 2  Sitting, inactive in a public place (e.g. a theatre or a meeting) 2  As a passenger in a car for an hour without a break 2  Lying down to rest in the afternoon when circumstances permit 3  Sitting and talking to someone 1  Sitting quietly after a  lunch without alcohol 2  In a car, while stopped for a few minutes in traffic 2  Total score 16    Tests:   FENO:  No results found for: NITRICOXIDE  PFT:    Latest Ref Rng & Units 06/08/2021   11:48 AM  PFT Results  FVC-Pre L 4.33   FVC-Predicted Pre % 84   FVC-Post L 3.82   FVC-Predicted Post % 74   Pre FEV1/FVC % % 66   Post FEV1/FCV % % 73   FEV1-Pre L 2.85   FEV1-Predicted Pre % 69   FEV1-Post L 2.79   DLCO uncorrected ml/min/mmHg 37.34   DLCO UNC% % 123   DLCO corrected ml/min/mmHg 37.34   DLCO COR %Predicted % 123   DLVA Predicted % 141   TLC L 5.88   TLC % Predicted %  87   RV % Predicted % 95   Personally viewed interpreted as mild 6 obstruction that resolves with significant bronchodilator response, lung volumes within normal notes, DLCO is elevated  WALK:      No data to display          Imaging: Personally reviewed and as per EMR discussion this note No results found.  Lab Results: Personally reviewed, no anemia, no significant elevation in eosinophils CBC    Component Value Date/Time   WBC 6.2 06/19/2023 1655   RBC 4.89 06/19/2023 1655   HGB 14.8 06/19/2023 1655   HGB 15.6 05/10/2022 0918   HCT 43.5 06/19/2023 1655   HCT 47.7 05/10/2022 0918   PLT 151 06/19/2023 1655   PLT 185 05/10/2022 0918   MCV 89.0 06/19/2023 1655   MCV 89 05/10/2022 0918   MCH 30.3 06/19/2023 1655   MCHC 34.0 06/19/2023 1655   RDW 13.7 06/19/2023 1655   RDW 14.0 05/10/2022 0918   LYMPHSABS 2.2 12/26/2021 1515   MONOABS 0.7 12/26/2021 1515   EOSABS 0.1 12/26/2021 1515   BASOSABS 0.1 12/26/2021 1515    BMET    Component Value Date/Time   NA 141 06/19/2023 1655   NA 144 03/10/2021 0910   K 3.7 06/19/2023 1655   CL 103 06/19/2023 1655   CO2 25 06/19/2023 1655   GLUCOSE 104 (H) 06/19/2023 1655   BUN 15 06/19/2023 1655   BUN 15 03/10/2021 0910   CREATININE 0.95 06/19/2023 1655   CALCIUM  9.1 06/19/2023 1655   GFRNONAA >60 06/19/2023 1655   GFRAA >60 03/29/2018 1706    BNP No results found for: BNP  ProBNP No results found for: PROBNP  Specialty Problems       Pulmonary Problems   Allergic rhinitis   Asthma in adult   Asymmetric tonsils   Last Assessment & Plan:  Formatting of this note might be different from the original. Concern over tonsil asymmetry. Noted on recent imaging of the head.  Denies any symptoms.  Smoked in the distant past. EXAM shows relatively symmetric appearing tonsils.  Both are soft on palpation.  No adenopathy in the neck. PLAN: Reassured all looks okay.  No further treatment or evaluation necessary.       Laryngitis   Nasal septal deviation   Dyspnea on exertion    No Known Allergies  Immunization History  Administered Date(s) Administered   DTP 11/12/1980, 01/18/1981   Hepatitis B 10/12/1995, 11/15/1995, 05/02/1996   Influenza, Seasonal, Injecte, Preservative Fre 11/09/2022   Influenza,inj,Quad PF,6+ Mos 11/16/2020   MMR 12/03/1981, 11/15/1995   PFIZER(Purple Top)SARS-COV-2 Vaccination 04/26/2019, 05/18/2019, 01/01/2020   Pneumococcal  Polysaccharide-23 04/05/2020   Tdap 08/13/2012, 08/15/2022    Past Medical History:  Diagnosis Date   Allergy    Allergy, unspecified not elsewhere classified    rx w/ OTC antihistamines PRN   Anxiety    Asthma    Atypical chest pain    recent neg eval w/ normal cxr/ekg   Depression    Diverticulosis    DM (diabetes mellitus) (HCC)    Dyspepsia    on protonix  40mg /d   Esophagitis    GERD (gastroesophageal reflux disease)    on protonix  40mg /d   HTN (hypertension)    Hyperlipidemia    on diet alone   IBS (irritable bowel syndrome)    Lumbar back pain    s/p lumbar laminectomy 2007 by DrCabbell   Migraines    Overweight(278.02)    weight Jan10=246#.SABRA.he was 225# in 9/07...diet and exercise was discussed   Sleep apnea    not wearing c-pap currently    Tobacco History: Social History   Tobacco Use  Smoking Status Former   Current packs/day: 0.00   Average packs/day: 0.1 packs/day for 8.0 years (0.8 ttl pk-yrs)   Types: Cigarettes   Start date: 03/03/2003   Quit date: 03/03/2011   Years since quitting: 12.5  Smokeless Tobacco Never   Counseling given: Not Answered   Continue to not smoke  Outpatient Encounter Medications as of 09/20/2023  Medication Sig   albuterol  (VENTOLIN  HFA) 108 (90 Base) MCG/ACT inhaler INHALE 2 PUFFS INTO THE LUNGS EVERY 6 HOURS AS NEEDED FOR WHEEZING OR SHORTNESS OF BREATH (Patient taking differently: Inhale 2 puffs into the lungs every 6 (six) hours as needed for wheezing or shortness of breath.)    Continuous Glucose Sensor (FREESTYLE LIBRE 3 SENSOR) MISC USE SENSOR TO CHECK BLOOD GLUCOSE CONTINUOUSLY (Patient taking differently: 1 each by Other route daily. dUSE SENSOR TO CHECK BLOOD GLUCOSE CONTINUOUSLY)   ibuprofen  (ADVIL ) 600 MG tablet Take 1 tablet (600 mg total) by mouth every 8 (eight) hours as needed.   levocetirizine (XYZAL ) 5 MG tablet Take 1 tablet (5 mg total) by mouth every evening.   Multiple Vitamin (MULTIVITAMIN ADULT PO) Take 2 tablets by mouth daily.   atorvastatin  (LIPITOR) 10 MG tablet TAKE 1 TABLET(10 MG) BY MOUTH DAILY (Patient not taking: Reported on 09/20/2023)   azelastine  (ASTELIN ) 0.1 % nasal spray Place 2 sprays into both nostrils 2 (two) times daily. Use in each nostril as directed (Patient not taking: Reported on 09/20/2023)   budesonide -formoterol  (SYMBICORT ) 160-4.5 MCG/ACT inhaler Inhale 2 puffs into the lungs in the morning and at bedtime. (Patient not taking: Reported on 09/20/2023)   fluticasone  (FLONASE ) 50 MCG/ACT nasal spray Place 2 sprays into both nostrils daily. (Patient not taking: Reported on 09/20/2023)   metFORMIN  (GLUCOPHAGE ) 1000 MG tablet TAKE 1 TABLET(1000 MG) BY MOUTH TWICE DAILY WITH A MEAL (Patient not taking: Reported on 09/20/2023)   No facility-administered encounter medications on file as of 09/20/2023.     Review of Systems  Review of Systems  N/a Physical Exam  BP 131/79   Pulse (!) 106   Temp 98.2 F (36.8 C)   Ht 5' 10 (1.778 m)   Wt 208 lb 3.2 oz (94.4 kg)   SpO2 96% Comment: RA  BMI 29.87 kg/m   Wt Readings from Last 5 Encounters:  09/20/23 208 lb 3.2 oz (94.4 kg)  08/31/23 201 lb (91.2 kg)  07/10/23 198 lb (89.8 kg)  07/03/23 199 lb (90.3 kg)  06/22/23 200 lb (90.7  kg)    BMI Readings from Last 5 Encounters:  09/20/23 29.87 kg/m  08/31/23 28.84 kg/m  07/10/23 27.62 kg/m  07/03/23 27.75 kg/m  06/22/23 28.70 kg/m     Physical Exam General: Sitting in chair, no acute distress Eyes: EOMI, icterus Neck:  Supple, no JVP Pulmonary: Clear, no work of breathing, good air movement Cardiovascular: Regular rhythm, no murmur Abdomen: Tender to palpation epigastric area MSK: No synovitis, joint effusion Neuro: Normal gait, no weakness Psych: Normal mood, flat affect   Assessment & Plan:   Dyspnea exertion: Now resolved, likely seasonal variation, variation with asthma.  Asthma: Seems well-controlled.  Likely attributable to weight loss.  Used Trelegy in the past but made things worse, almost like bronchospasm.  Removed from medication list.  Prescribed Symbicort  to have on hand in case he has seasonal issues with wheezing cough etc. or with URIs.  He uses this as needed.  Infrequently.  Doing quite well.  Return in about 1 year (around 09/19/2024) for f/u Dr. Annella.   Donnice JONELLE Annella, MD 09/20/2023

## 2023-10-19 ENCOUNTER — Encounter: Payer: Self-pay | Admitting: Medical

## 2023-10-19 MED ORDER — FREESTYLE LIBRE 3 PLUS SENSOR MISC: 2 refills | Status: AC

## 2023-12-12 ENCOUNTER — Ambulatory Visit (INDEPENDENT_AMBULATORY_CARE_PROVIDER_SITE_OTHER): Admitting: Medical

## 2023-12-12 VITALS — BP 118/78 | HR 87 | Temp 98.4°F | Resp 15 | Ht 70.0 in | Wt 208.0 lb

## 2023-12-12 DIAGNOSIS — R5383 Other fatigue: Secondary | ICD-10-CM

## 2023-12-12 DIAGNOSIS — E1169 Type 2 diabetes mellitus with other specified complication: Secondary | ICD-10-CM | POA: Diagnosis not present

## 2023-12-12 DIAGNOSIS — N529 Male erectile dysfunction, unspecified: Secondary | ICD-10-CM

## 2023-12-12 DIAGNOSIS — Z7984 Long term (current) use of oral hypoglycemic drugs: Secondary | ICD-10-CM

## 2023-12-12 DIAGNOSIS — Z23 Encounter for immunization: Secondary | ICD-10-CM

## 2023-12-12 DIAGNOSIS — E785 Hyperlipidemia, unspecified: Secondary | ICD-10-CM

## 2023-12-12 DIAGNOSIS — E119 Type 2 diabetes mellitus without complications: Secondary | ICD-10-CM

## 2023-12-12 LAB — LIPID PANEL
Cholesterol: 209 mg/dL — ABNORMAL HIGH (ref 0–200)
HDL: 39.1 mg/dL (ref >39.00)
LDL Cholesterol: 138 mg/dL — ABNORMAL HIGH (ref 0–99)
NonHDL: 169.71
Total CHOL/HDL Ratio: 5
Triglycerides: 160 mg/dL — ABNORMAL HIGH (ref 0.0–149.0)
VLDL: 32 mg/dL (ref 0.0–40.0)

## 2023-12-12 LAB — COMPLETE METABOLIC PANEL WITHOUT GFR
AG Ratio: 2 (calc) (ref 1.0–2.5)
ALT: 20 U/L (ref 9–46)
AST: 16 U/L (ref 10–40)
Albumin: 4.5 g/dL (ref 3.6–5.1)
Alkaline phosphatase (APISO): 41 U/L (ref 36–130)
BUN: 14 mg/dL (ref 7–25)
CO2: 31 mmol/L (ref 20–32)
Calcium: 9.7 mg/dL (ref 8.6–10.3)
Chloride: 106 mmol/L (ref 98–110)
Creat: 0.85 mg/dL (ref 0.60–1.29)
Globulin: 2.2 g/dL (ref 1.9–3.7)
Glucose, Bld: 99 mg/dL (ref 65–99)
Potassium: 4.2 mmol/L (ref 3.5–5.3)
Sodium: 145 mmol/L (ref 135–146)
Total Bilirubin: 0.4 mg/dL (ref 0.2–1.2)
Total Protein: 6.7 g/dL (ref 6.1–8.1)

## 2023-12-12 LAB — MICROALBUMIN / CREATININE URINE RATIO
Creatinine,U: 150.7 mg/dL
Microalb Creat Ratio: UNDETERMINED mg/g (ref 0.0–30.0)
Microalb, Ur: <0.7 mg/dL

## 2023-12-12 LAB — HEMOGLOBIN A1C: Hgb A1c MFr Bld: 5.5 % (ref 4.6–6.5)

## 2023-12-12 NOTE — Addendum Note (Signed)
 Addended by: GERARD CHUCKIE SAILOR on: 12/12/2023 08:31 AM   Modules accepted: Orders

## 2023-12-12 NOTE — Progress Notes (Signed)
   Subjective:    Patient ID: Anthony Russell, male    DOB: 10/28/1980, 43 y.o.   MRN: [id number]  HPI  Anthony Russell is a 43 year old male with diabetes who presents for routine follow-up.  He has a history of diabetes, previously poorly controlled with an A1c of 7.5% in 2022, now improved to 5.6% ten months ago due to dietary changes, weight loss, and increased physical activity. He has not been consistently taking metformin  1000 mg twice daily, managing his diabetes through lifestyle modifications.  He reports recent dietary indulgences but has resumed control over his eating habits, currently fasting and maintaining a low-sugar diet. He exercises regularly, focusing on both upper and lower body workouts and incorporating stretches from physical therapy, though he has not exercised in the past couple of weeks due to not feeling well.  He is on atorvastatin  10 mg but does not take it as regularly as advised, recalling instructions to take it at least three times a week by his cardiologist.  He received a flu vaccine recently and has had a pneumonia vaccine in the past, specifically the PCV 23. He has an upcoming appointment with an optometrist next week for a diabetic eye exam report.  He humorously noted that his feet are in better condition now that he no longer has a cat that used to 'protect' them.  He mentions having three cats, with one previously lost in a fire.   Review of Systems See hpi    Objective:   Physical Exam  General- No acute distress. Pleasant patient. Neck- Full range of motion, no jvd Lungs- Clear, even and unlabored. Heart- regular rate and rhythm. Neurologic- CNII- XII grossly intact.  Lower ext- see quality metrics      Assessment & Plan:   Assessment and Plan    Type 2 diabetes mellitus without complications Diabetes well-controlled with A1c of 5.6%. Managed through diet, weight loss, and exercise without regular metformin  use. - Ordered A1c  test. - Ordered metabolic panel. - Ordered urine microalbumin test. - Advised continuation of healthy diet and exercise. - Requested diabetic eye exam report from optometrist.   High cholesterol -check fasting lipid panel and cmp.   ED and some fatigue -will get testosterone  panel -can try the sildanefil you got from [facility]. Let me know if helps  Encounter for immunization Due for PCV 20 vaccine, previously received PCV 23. - Administered PCV 20 vaccine.   Follow up date to be determined after lab review        Dallas Maxwell, PA-C

## 2023-12-12 NOTE — Patient Instructions (Signed)
 Type 2 diabetes mellitus without complications Diabetes well-controlled with A1c of 5.6%. Managed through diet, weight loss, and exercise without regular metformin  use. - Ordered A1c test. - Ordered metabolic panel. - Ordered urine microalbumin test. - Advised continuation of healthy diet and exercise. - Requested diabetic eye exam report from optometrist.   High cholesterol -check fasting lipid panel and cmp.   ED and some fatigue -will get testosterone  panel -can try the sildanefil you got from [facility]. Let me know if helps  Encounter for immunization Due for PCV 20 vaccine, previously received PCV 23. - Administered PCV 20 vaccine.   Follow up date to be determined after lab review

## 2023-12-14 ENCOUNTER — Ambulatory Visit: Payer: Self-pay | Admitting: Medical

## 2023-12-19 LAB — OPHTHALMOLOGY REPORT-SCANNED

## 2023-12-31 DIAGNOSIS — F4323 Adjustment disorder with mixed anxiety and depressed mood: Secondary | ICD-10-CM | POA: Diagnosis not present

## 2024-01-11 ENCOUNTER — Other Ambulatory Visit (INDEPENDENT_AMBULATORY_CARE_PROVIDER_SITE_OTHER)

## 2024-01-11 DIAGNOSIS — R5383 Other fatigue: Secondary | ICD-10-CM

## 2024-01-11 DIAGNOSIS — N529 Male erectile dysfunction, unspecified: Secondary | ICD-10-CM | POA: Diagnosis not present

## 2024-01-12 LAB — TESTOSTERONE TOTAL,FREE,BIO, MALES
Albumin: 4.5 g/dL (ref 3.6–5.1)
Sex Hormone Binding: 33 nmol/L (ref 10–50)
Testosterone, Bioavailable: 126.8 ng/dL (ref 110.0–575.0)
Testosterone, Free: 61.6 pg/mL (ref 46.0–224.0)
Testosterone: 462 ng/dL (ref 250–827)

## 2024-01-17 ENCOUNTER — Telehealth: Admitting: Family Medicine

## 2024-01-17 DIAGNOSIS — B9689 Other specified bacterial agents as the cause of diseases classified elsewhere: Secondary | ICD-10-CM

## 2024-01-17 DIAGNOSIS — J019 Acute sinusitis, unspecified: Secondary | ICD-10-CM | POA: Diagnosis not present

## 2024-01-17 MED ORDER — AMOXICILLIN-POT CLAVULANATE 875-125 MG PO TABS: 1.0000 | ORAL_TABLET | Freq: Two times a day (BID) | ORAL | 0 refills | Status: AC

## 2024-01-17 MED ORDER — BENZONATATE 100 MG PO CAPS: 100.0000 mg | ORAL_CAPSULE | Freq: Three times a day (TID) | ORAL | 0 refills | Status: AC | PRN

## 2024-01-17 MED ORDER — PROMETHAZINE-DM 6.25-15 MG/5ML PO SYRP: 5.0000 mL | ORAL_SOLUTION | Freq: Four times a day (QID) | ORAL | 0 refills | Status: AC | PRN

## 2024-01-17 NOTE — Progress Notes (Signed)
 Virtual Visit Consent   Anthony Russell, you are scheduled for a virtual visit with a [facility] provider today. Just as with appointments in the office, your consent must be obtained to participate. Your consent will be active for this visit and any virtual visit you may have with one of our providers in the next 365 days. If you have a MyChart account, a copy of this consent can be sent to you electronically.  As this is a virtual visit, video technology does not allow for your provider to perform a traditional examination. This may limit your provider's ability to fully assess your condition. If your provider identifies any concerns that need to be evaluated in person or the need to arrange testing (such as labs, EKG, etc.), we will make arrangements to do so. Although advances in technology are sophisticated, we cannot ensure that it will always work on either your end or our end. If the connection with a video visit is poor, the visit may have to be switched to a telephone visit. With either a video or telephone visit, we are not always able to ensure that we have a secure connection.  By engaging in this virtual visit, you consent to the provision of healthcare and authorize for your insurance to be billed (if applicable) for the services provided during this visit. Depending on your insurance coverage, you may receive a charge related to this service.  I need to obtain your verbal consent now. Are you willing to proceed with your visit today? Anthony Russell has provided verbal consent on 01/17/2024 for a virtual visit (video or telephone). Anthony CHRISTELLA Barefoot, NP  Date: 01/17/2024 8:57 AM   Virtual Visit via Video Note   I, Anthony Russell, connected with  Anthony Russell  ([id number], [birth year]) on 01/17/2024 at  9:00 AM EST by a video-enabled telemedicine application and verified that I am speaking with the correct person using two identifiers.  Location: Patient: Virtual Visit Location Patient:  Home Provider: Virtual Visit Location Provider: Home Office   I discussed the limitations of evaluation and management by telemedicine and the availability of in person appointments. The patient expressed understanding and agreed to proceed.    History of Present Illness: Anthony Russell is a 43 y.o. who identifies as a male who was assigned male at birth, and is being seen today for cough and congestion  Onset was 5-6 days started with cough and congestion  Associated symptoms are congestion, cough- productive at times, sneezing, runny nose, sinus pressure, drainage into chest from PND Modifying factors are theraflu, day quil, mucinex Denies chest pain, shortness of breath, fevers, chills  Exposure to sick contacts- roommate is sick as well was neg for covid COVID test: no Vaccines: flu and covids   Problems:  Patient Active Problem List   Diagnosis Date Noted   Hip tightness 08/31/2023   Palpitations 06/22/2023   Lumbar radiculopathy 05/10/2022   Capsulitis of left shoulder 05/10/2022   Sinus tachycardia 12/28/2021   Impingement of knee joint, right 12/20/2021   Achilles tendinitis, right leg 12/20/2021   Rotator cuff tendinitis, right 11/04/2021   Encounter for screening for other metabolic disorders 04/07/2021   Dyspnea on exertion 04/07/2021   Migraines 02/23/2021   Lumbar back pain 02/23/2021   HTN (hypertension) 02/23/2021   Dyspepsia 02/23/2021   DM (diabetes mellitus) (HCC) 02/23/2021   Nasal septal deviation 02/17/2021   Laryngitis 01/19/2021   Asymmetric tonsils 12/01/2020   Subacromial bursitis of  right shoulder joint 05/17/2020   Hypersomnia 11/03/2015   Obesity 11/03/2015   Allergic rhinitis 04/15/2014   Asthma in adult 04/15/2014   GERD (gastroesophageal reflux disease) 04/15/2014   IBS (irritable bowel syndrome) 04/01/2013   Nausea alone 01/15/2013   Diarrhea 01/15/2013   Depression 10/15/2012   Lesion of eyebrow 02/09/2012   Gastroparesis 07/05/2011    Anxiety 07/05/2010   TESTOSTERONE  DEFICIENCY 05/26/2008   Hyperlipidemia 02/06/2008   Facet arthropathy, lumbar 02/06/2008   Atypical chest pain 02/06/2008    Allergies: Allergies[1] Medications: Current Medications[2]  Observations/Objective: Patient is well-developed, well-nourished in no acute distress.  Resting comfortably  at home.  Head is normocephalic, atraumatic.  No labored breathing.  Speech is clear and coherent with logical content.  Patient is alert and oriented at baseline.  Congestion tone Cough present  Assessment and Plan:   1. Acute bacterial sinusitis (Primary)  - promethazine -dextromethorphan (PROMETHAZINE -DM) 6.25-15 MG/5ML syrup; Take 5 mLs by mouth 4 (four) times daily as needed for cough.  Dispense: 118 mL; Refill: 0 - amoxicillin -clavulanate (AUGMENTIN ) 875-125 MG tablet; Take 1 tablet by mouth 2 (two) times daily for 7 days.  Dispense: 14 tablet; Refill: 0 - benzonatate  (TESSALON ) 100 MG capsule; Take 1-2 capsules (100-200 mg total) by mouth 3 (three) times daily as needed for cough.  Dispense: 30 capsule; Refill: 0   -Delay Augmentin - , pick up at day 8 (Sunday ) if not improving or worsening. -Promethazine  DM and Tessalon  [place] directions discussed, do not use Promethazine  DM with over-the-counter cough syrups or medications that also have DM listed in the label - Increased rest - Increasing Fluids - Acetaminophen  / ibuprofen  as needed for fever/pain.  - Salt water gargling, chloraseptic spray and throat lozenges - Mucinex if mucus is present and increasing.  - Saline nasal spray if congestion or if nasal passages feel dry. - Humidifying the air.   Reviewed side effects, risks and benefits of medication.    Patient acknowledged agreement and understanding of the plan.   Past Medical, Surgical, Social History, Allergies, and Medications have been Reviewed.    Follow Up Instructions: I discussed the assessment and treatment plan with the  patient. The patient was provided an opportunity to ask questions and all were answered. The patient agreed with the plan and demonstrated an understanding of the instructions.  A copy of instructions were sent to the patient via MyChart unless otherwise noted below.    The patient was advised to call back or seek an in-person evaluation if the symptoms worsen or if the condition fails to improve as anticipated.    Anthony CHRISTELLA Barefoot, NP     [1] No Known Allergies [2]  Current Outpatient Medications:    albuterol  (VENTOLIN  HFA) 108 (90 Base) MCG/ACT inhaler, INHALE 2 PUFFS INTO THE LUNGS EVERY 6 HOURS AS NEEDED FOR WHEEZING OR SHORTNESS OF BREATH (Patient taking differently: Inhale 2 puffs into the lungs every 6 (six) hours as needed for wheezing or shortness of breath.), Disp: 20.1 g, Rfl: 0   atorvastatin  (LIPITOR) 10 MG tablet, TAKE 1 TABLET(10 MG) BY MOUTH DAILY (Patient not taking: Reported on 12/12/2023), Disp: 30 tablet, Rfl: 4   azelastine  (ASTELIN ) 0.1 % nasal spray, Place 2 sprays into both nostrils 2 (two) times daily. Use in each nostril as directed (Patient not taking: Reported on 12/12/2023), Disp: 30 mL, Rfl: 11   budesonide -formoterol  (SYMBICORT ) 160-4.5 MCG/ACT inhaler, Inhale 2 puffs into the lungs in the morning and at bedtime. (Patient not taking: Reported  on 12/12/2023), Disp: 1 each, Rfl: 11   Continuous Glucose Sensor (FREESTYLE LIBRE 3 PLUS SENSOR) MISC, Change sensor every 15 days., Disp: 2 each, Rfl: 2   fluticasone  (FLONASE ) 50 MCG/ACT nasal spray, Place 2 sprays into both nostrils daily. (Patient not taking: Reported on 12/12/2023), Disp: 16 g, Rfl: 5   ibuprofen  (ADVIL ) 600 MG tablet, Take 1 tablet (600 mg total) by mouth every 8 (eight) hours as needed., Disp: 60 tablet, Rfl: 1   levocetirizine (XYZAL ) 5 MG tablet, Take 1 tablet (5 mg total) by mouth every evening., Disp: 30 tablet, Rfl: 3   metFORMIN  (GLUCOPHAGE ) 1000 MG tablet, TAKE 1 TABLET(1000 MG) BY MOUTH TWICE  DAILY WITH A MEAL (Patient not taking: Reported on 12/12/2023), Disp: 30 tablet, Rfl: 2   Multiple Vitamin (MULTIVITAMIN ADULT PO), Take 2 tablets by mouth daily., Disp: , Rfl:

## 2024-01-17 NOTE — Patient Instructions (Signed)
 [name], thank you for joining [name], NP for today's virtual visit.  While this provider is not your primary care provider (PCP), if your PCP is located in our provider database this encounter information will be shared with them immediately following your visit.   A [facility] MyChart account gives you access to today's visit and all your visits, tests, and labs performed at [facility]  click here if you don't have a [facility] MyChart account or go to [email or web address]  Consent: (Patient) Anthony Russell provided verbal consent for this virtual visit at the beginning of the encounter.  Current Medications:  Current Outpatient Medications:    [START ON 01/20/2024] amoxicillin -clavulanate (AUGMENTIN ) 875-125 MG tablet, Take 1 tablet by mouth 2 (two) times daily for 7 days., Disp: 14 tablet, Rfl: 0   benzonatate  (TESSALON ) 100 MG capsule, Take 1-2 capsules (100-200 mg total) by mouth 3 (three) times daily as needed for cough., Disp: 30 capsule, Rfl: 0   promethazine -dextromethorphan (PROMETHAZINE -DM) 6.25-15 MG/5ML syrup, Take 5 mLs by mouth 4 (four) times daily as needed for cough., Disp: 118 mL, Rfl: 0   albuterol  (VENTOLIN  HFA) 108 (90 Base) MCG/ACT inhaler, INHALE 2 PUFFS INTO THE LUNGS EVERY 6 HOURS AS NEEDED FOR WHEEZING OR SHORTNESS OF BREATH (Patient taking differently: Inhale 2 puffs into the lungs every 6 (six) hours as needed for wheezing or shortness of breath.), Disp: 20.1 g, Rfl: 0   atorvastatin  (LIPITOR) 10 MG tablet, TAKE 1 TABLET(10 MG) BY MOUTH DAILY (Patient not taking: Reported on 12/12/2023), Disp: 30 tablet, Rfl: 4   azelastine  (ASTELIN ) 0.1 % nasal spray, Place 2 sprays into both nostrils 2 (two) times daily. Use in each nostril as directed (Patient not taking: Reported on 12/12/2023), Disp: 30 mL, Rfl: 11   budesonide -formoterol  (SYMBICORT ) 160-4.5 MCG/ACT inhaler, Inhale 2 puffs into the lungs in the morning and at bedtime.  (Patient not taking: Reported on 12/12/2023), Disp: 1 each, Rfl: 11   Continuous Glucose Sensor (FREESTYLE LIBRE 3 PLUS SENSOR) MISC, Change sensor every 15 days., Disp: 2 each, Rfl: 2   fluticasone  (FLONASE ) 50 MCG/ACT nasal spray, Place 2 sprays into both nostrils daily. (Patient not taking: Reported on 12/12/2023), Disp: 16 g, Rfl: 5   ibuprofen  (ADVIL ) 600 MG tablet, Take 1 tablet (600 mg total) by mouth every 8 (eight) hours as needed., Disp: 60 tablet, Rfl: 1   levocetirizine (XYZAL ) 5 MG tablet, Take 1 tablet (5 mg total) by mouth every evening., Disp: 30 tablet, Rfl: 3   metFORMIN  (GLUCOPHAGE ) 1000 MG tablet, TAKE 1 TABLET(1000 MG) BY MOUTH TWICE DAILY WITH A MEAL (Patient not taking: Reported on 12/12/2023), Disp: 30 tablet, Rfl: 2   Multiple Vitamin (MULTIVITAMIN ADULT PO), Take 2 tablets by mouth daily., Disp: , Rfl:    Medications ordered in this encounter:  Meds ordered this encounter  Medications   promethazine -dextromethorphan (PROMETHAZINE -DM) 6.25-15 MG/5ML syrup    Sig: Take 5 mLs by mouth 4 (four) times daily as needed for cough.    Dispense:  118 mL    Refill:  0    Supervising Provider:   BLAISE ALEENE KIDD [id number]   amoxicillin -clavulanate (AUGMENTIN ) 875-125 MG tablet    Sig: Take 1 tablet by mouth 2 (two) times daily for 7 days.    Dispense:  14 tablet    Refill:  0    Supervising Provider:   BLAISE ALEENE KIDD [[id number]]   benzonatate  (TESSALON ) 100 MG capsule  Sig: Take 1-2 capsules (100-200 mg total) by mouth 3 (three) times daily as needed for cough.    Dispense:  30 capsule    Refill:  0    Supervising Provider:   BLAISE ALEENE KIDD [[id number]]     *If you need refills on other medications prior to your next appointment, please contact your pharmacy*  Follow-Up: Call back or seek an in-person evaluation if the symptoms worsen or if the condition fails to improve as anticipated.  [facility] [phone]  Other Instructions   -Delay  Augmentin - , pick up at day 8 (Sunday ) if not improving or worsening. -Promethazine  DM and Tessalon  [place] directions discussed, do not use Promethazine  DM with over-the-counter cough syrups or medications that also have DM listed in the label - Increased rest - Increasing Fluids - Acetaminophen  / ibuprofen  as needed for fever/pain.  - Salt water gargling, chloraseptic spray and throat lozenges - Mucinex if mucus is present and increasing.  - Saline nasal spray if congestion or if nasal passages feel dry. - Humidifying the air.    If you have been instructed to have an in-person evaluation today at a local Urgent Care facility, please use the link below. It will take you to a list of all of our available [facility], including address, phone number and hours of operation. Please do not delay care.  [facility]  If you or a family member do not have a primary care provider, use the link below to schedule a visit and establish care. When you choose a [facility] primary care physician or advanced practice provider, you gain a long-term partner in health. Find a Primary Care Provider  Learn more about [facility]'s in-office and virtual care options: [place] - Get Care Now

## 2024-02-04 ENCOUNTER — Ambulatory Visit: Admitting: Medical

## 2024-02-04 VITALS — BP 116/80 | HR 96 | Temp 98.3°F | Resp 15 | Ht 70.0 in | Wt 214.8 lb

## 2024-02-04 DIAGNOSIS — R059 Cough, unspecified: Secondary | ICD-10-CM

## 2024-02-04 DIAGNOSIS — R0981 Nasal congestion: Secondary | ICD-10-CM

## 2024-02-04 DIAGNOSIS — J40 Bronchitis, not specified as acute or chronic: Secondary | ICD-10-CM | POA: Diagnosis not present

## 2024-02-04 DIAGNOSIS — E119 Type 2 diabetes mellitus without complications: Secondary | ICD-10-CM | POA: Diagnosis not present

## 2024-02-04 MED ORDER — BENZONATATE 100 MG PO CAPS: 100.0000 mg | ORAL_CAPSULE | Freq: Three times a day (TID) | ORAL | 0 refills | Status: AC | PRN

## 2024-02-04 MED ORDER — METHYLPREDNISOLONE 4 MG PO TABS: ORAL_TABLET | ORAL | 0 refills | Status: AC

## 2024-02-04 MED ORDER — AZITHROMYCIN 250 MG PO TABS: ORAL_TABLET | ORAL | 0 refills | Status: AC

## 2024-02-04 NOTE — Progress Notes (Signed)
 "  Subjective:    Patient ID: Anthony Russell, male    DOB: Jul 21, 1980, 44 y.o.   MRN: [id number]  HPI  Anthony Russell is a 44 year old male who presents with persistent cough and nasal congestion.  He has had cough and nasal congestion since before Christmas. On virtual visit on December 18 he was diagnosed with sinusitis and treated with benzonatate , promethazine  DM, and augmentin . The cough has since improved and is now mild and intermittent, mild productive cough at times , and not associated with wheezing, chest pain, or dyspnea.  Nasal congestion remains his main issue, with occasional thick yellow-green mucus that provides partial relief. He uses Flonase  and saline nasal spray. He denies sinus or ear pain, fevers, chills, body aches, and a recent COVID test was negative.  He has diabetes managed with diet and exercise. Blood sugars have been stable, he is off metformin , and his last A1c was 5.5 on November 12.    Past Medical History:  Diagnosis Date   Allergy    Allergy, unspecified not elsewhere classified    rx w/ OTC antihistamines PRN   Anxiety    Asthma    Atypical chest pain    recent neg eval w/ normal cxr/ekg   Depression    Diverticulosis    DM (diabetes mellitus) (HCC)    Dyspepsia    on protonix  40mg /d   Esophagitis    GERD (gastroesophageal reflux disease)    on protonix  40mg /d   HTN (hypertension)    Hyperlipidemia    on diet alone   IBS (irritable bowel syndrome)    Lumbar back pain    s/p lumbar laminectomy 2007 by DrCabbell   Migraines    Overweight(278.02)    weight Jan10=246#.SABRA.he was 225# in 9/07...diet and exercise was discussed   Sleep apnea    not wearing c-pap currently     Social History   Socioeconomic History   Marital status: Single    Spouse name: Not on file   Number of children: 0   Years of education: Not on file   Highest education level: Associate degree: academic program  Occupational History   Occupation: Librarian, Academic  Tobacco Use   Smoking status: Former    Current packs/day: 0.00    Average packs/day: 0.1 packs/day for 8.0 years (0.8 ttl pk-yrs)    Types: Cigarettes    Start date: 03/03/2003    Quit date: 03/03/2011    Years since quitting: 12.9   Smokeless tobacco: Never  Vaping Use   Vaping status: Never Used  Substance and Sexual Activity   Alcohol use: Yes    Comment: once or twice a month   Drug use: No   Sexual activity: Not on file  Other Topics Concern   Not on file  Social History Narrative   3 cups caffeine  a day   Social Drivers of Health   Tobacco Use: Medium Risk (01/17/2024)   Patient History    Smoking Tobacco Use: Former    Smokeless Tobacco Use: Never    Passive Exposure: Not on Actuary Strain: Low Risk (12/10/2023)   Overall Financial Resource Strain (CARDIA)    Difficulty of Paying Living Expenses: Not very hard  Food Insecurity: No Food Insecurity (12/10/2023)   Epic    Worried About Programme Researcher, Broadcasting/film/video in the Last Year: Never true    Ran Out of Food in the Last Year: Never true  Transportation Needs: No Transportation  Needs (12/10/2023)   Epic    Lack of Transportation (Medical): No    Lack of Transportation (Non-Medical): No  Physical Activity: Insufficiently Active (12/10/2023)   Exercise Vital Sign    Days of Exercise per Week: 2 days    Minutes of Exercise per Session: 30 min  Stress: No Stress Concern Present (12/10/2023)   [employer] of Occupational Health - Occupational Stress Questionnaire    Feeling of Stress: Only a little  Social Connections: Socially Isolated (12/10/2023)   Social Connection and Isolation Panel    Frequency of Communication with Friends and Family: Once a week    Frequency of Social Gatherings with Friends and Family: Once a week    Attends Religious Services: Never    Database Administrator or Organizations: No    Attends Engineer, Structural: Not on file    Marital Status: Never  married  Intimate Partner Violence: Not on file  Depression (PHQ2-9): Medium Risk (12/12/2023)   Depression (PHQ2-9)    PHQ-2 Score: 6  Alcohol Screen: Low Risk (12/10/2023)   Alcohol Screen    Last Alcohol Screening Score (AUDIT): 3  Housing: Unknown (12/10/2023)   Epic    Unable to Pay for Housing in the Last Year: No    Number of Times Moved in the Last Year: Not on file    Homeless in the Last Year: No  Utilities: Not on file  Health Literacy: Not on file    Past Surgical History:  Procedure Laterality Date   DENTAL SURGERY     LUMBAR LAMINECTOMY  01/30/2005   DrCabbell   TEAR DUCT PROBING      Family History  Problem Relation Age of Onset   Allergies Mother    Irritable bowel syndrome Mother    Diabetes Mother    Allergies Father    Diabetes Father    Hyperlipidemia Father    Irritable bowel syndrome Brother    Heart disease Brother    Allergies Brother    Breast cancer Maternal Aunt    Colon cancer Neg Hx    Esophageal cancer Neg Hx    Rectal cancer Neg Hx    Stomach cancer Neg Hx     Allergies[1]  Medications Ordered Prior to Encounter[2]  BP 116/80   Pulse 96   Temp 98.3 F (36.8 C) (Oral)   Resp 15   Ht 5' 10 (1.778 m)   Wt 214 lb 12.8 oz (97.4 kg)   SpO2 97%   BMI 30.82 kg/m       Review of Systems  Constitutional:  Negative for chills, fatigue and fever.  HENT:  Positive for congestion, sinus pressure and sinus pain.   Respiratory:  Positive for cough. Negative for chest tightness and wheezing.   Cardiovascular:  Negative for chest pain and palpitations.  Gastrointestinal:  Negative for abdominal pain and blood in stool.  Genitourinary:  Negative for flank pain and frequency.  Musculoskeletal:  Negative for back pain and myalgias.  Skin:  Negative for rash.  Neurological:  Negative for dizziness, syncope, weakness and light-headedness.  Hematological:  Negative for adenopathy.  Psychiatric/Behavioral:  Negative for behavioral  problems and dysphoric mood.            Objective:   Physical Exam  General- No acute distress. Pleasant patient. Neck- Full range of motion, no jvd Lungs- Clear, even and unlabored. Heart- regular rate and rhythm. Neurologic- CNII- XII grossly intact.  Heent- sounds nasal congested but no sinus  pressure. Ears- canals clear and normal tms. No sinus pressure.     Assessment & Plan:   Bronchitis Persistent nasal and chest congestion for three weeks with productive cough. Initial treatment included benzonatate , promethazine  DM, and augmentin . Considering azithromycin  for atypical bacterial coverage. Medrol  prescribed for decongestion. Chest x-ray ordered if symptoms persist. - Refilled benzonatate . - Prescribed azithromycin  for five days. - Prescribed Medrol  for three days (tapered dose). - Continue Flonase . - Ordered chest x-ray as a standing order if symptoms persist.  Type 2 diabetes mellitus, well controlled Well controlled with diet and exercise. Last A1c was 5.5% on November 12th. No metformin  use. - Ordered future metabolic panel and A1c after February 12th, preferably around February 18th. - Continue diet and exercise regimen. -strict low sugar diet while on medrol   Follow up date to be determined pending how you respond to treatment and based on lab review.  Jullisa Grigoryan, PA-C     [1] No Known Allergies [2]  Current Outpatient Medications on File Prior to Visit  Medication Sig Dispense Refill   albuterol  (VENTOLIN  HFA) 108 (90 Base) MCG/ACT inhaler INHALE 2 PUFFS INTO THE LUNGS EVERY 6 HOURS AS NEEDED FOR WHEEZING OR SHORTNESS OF BREATH (Patient taking differently: Inhale 2 puffs into the lungs every 6 (six) hours as needed for wheezing or shortness of breath.) 20.1 g 0   benzonatate  (TESSALON ) 100 MG capsule Take 1-2 capsules (100-200 mg total) by mouth 3 (three) times daily as needed for cough. 30 capsule 0   Continuous Glucose Sensor (FREESTYLE LIBRE 3 PLUS  SENSOR) MISC Change sensor every 15 days. 2 each 2   ibuprofen  (ADVIL ) 600 MG tablet Take 1 tablet (600 mg total) by mouth every 8 (eight) hours as needed. 60 tablet 1   levocetirizine (XYZAL ) 5 MG tablet Take 1 tablet (5 mg total) by mouth every evening. 30 tablet 3   Magnesium Glycinate 100 MG CAPS Take by mouth.     Multiple Vitamin (MULTIVITAMIN ADULT PO) Take 2 tablets by mouth daily.     Omega-3 Fatty Acids (OMEGA 3 FISH OIL PO) Take by mouth.     promethazine -dextromethorphan (PROMETHAZINE -DM) 6.25-15 MG/5ML syrup Take 5 mLs by mouth 4 (four) times daily as needed for cough. 118 mL 0   atorvastatin  (LIPITOR) 10 MG tablet TAKE 1 TABLET(10 MG) BY MOUTH DAILY (Patient not taking: Reported on 02/04/2024) 30 tablet 4   azelastine  (ASTELIN ) 0.1 % nasal spray Place 2 sprays into both nostrils 2 (two) times daily. Use in each nostril as directed (Patient not taking: Reported on 02/04/2024) 30 mL 11   budesonide -formoterol  (SYMBICORT ) 160-4.5 MCG/ACT inhaler Inhale 2 puffs into the lungs in the morning and at bedtime. (Patient not taking: Reported on 02/04/2024) 1 each 11   fluticasone  (FLONASE ) 50 MCG/ACT nasal spray Place 2 sprays into both nostrils daily. (Patient not taking: Reported on 02/04/2024) 16 g 5   metFORMIN  (GLUCOPHAGE ) 1000 MG tablet TAKE 1 TABLET(1000 MG) BY MOUTH TWICE DAILY WITH A MEAL (Patient not taking: Reported on 02/04/2024) 30 tablet 2   No current facility-administered medications on file prior to visit.   "

## 2024-02-04 NOTE — Patient Instructions (Signed)
 Bronchitis Persistent nasal and chest congestion for three weeks with productive cough. Initial treatment included benzonatate , promethazine  DM, and augmentin . Considering azithromycin  for atypical bacterial coverage. Medrol  prescribed for decongestion. Chest x-ray ordered if symptoms persist. - Refilled benzonatate . - Prescribed azithromycin  for five days. - Prescribed Medrol  for three days (tapered dose). - Continue Flonase . - Ordered chest x-ray as a standing order if symptoms persist.  Type 2 diabetes mellitus, well controlled Well controlled with diet and exercise. Last A1c was 5.5% on November 12th. No metformin  use. - Ordered future metabolic panel and A1c after February 12th, preferably around February 18th. - Continue diet and exercise regimen. -strict low sugar diet while on medrol   Follow up date to be determined pending how you respond to treatment and based on lab review.
